# Patient Record
Sex: Male | Born: 1945 | Race: White | Hispanic: No | Smoking: Former smoker
Health system: Southern US, Community
[De-identification: ages and names within clinical notes are randomized; demographics above are authoritative.]

## PROBLEM LIST (undated history)

## (undated) DIAGNOSIS — E785 Hyperlipidemia, unspecified: Secondary | ICD-10-CM

## (undated) DIAGNOSIS — J189 Pneumonia, unspecified organism: Secondary | ICD-10-CM

## (undated) DIAGNOSIS — K559 Vascular disorder of intestine, unspecified: Secondary | ICD-10-CM

## (undated) DIAGNOSIS — I491 Atrial premature depolarization: Secondary | ICD-10-CM

## (undated) DIAGNOSIS — K279 Peptic ulcer, site unspecified, unspecified as acute or chronic, without hemorrhage or perforation: Secondary | ICD-10-CM

## (undated) DIAGNOSIS — J45909 Unspecified asthma, uncomplicated: Secondary | ICD-10-CM

## (undated) DIAGNOSIS — M318 Other specified necrotizing vasculopathies: Secondary | ICD-10-CM

## (undated) DIAGNOSIS — I7782 Antineutrophilic cytoplasmic antibody (ANCA) vasculitis: Secondary | ICD-10-CM

## (undated) DIAGNOSIS — Z9289 Personal history of other medical treatment: Secondary | ICD-10-CM

## (undated) DIAGNOSIS — K219 Gastro-esophageal reflux disease without esophagitis: Secondary | ICD-10-CM

## (undated) DIAGNOSIS — I493 Ventricular premature depolarization: Secondary | ICD-10-CM

## (undated) DIAGNOSIS — E119 Type 2 diabetes mellitus without complications: Secondary | ICD-10-CM

## (undated) DIAGNOSIS — F419 Anxiety disorder, unspecified: Secondary | ICD-10-CM

## (undated) DIAGNOSIS — I712 Thoracic aortic aneurysm, without rupture, unspecified: Secondary | ICD-10-CM

## (undated) DIAGNOSIS — M1990 Unspecified osteoarthritis, unspecified site: Secondary | ICD-10-CM

## (undated) DIAGNOSIS — I2510 Atherosclerotic heart disease of native coronary artery without angina pectoris: Secondary | ICD-10-CM

## (undated) DIAGNOSIS — I6529 Occlusion and stenosis of unspecified carotid artery: Secondary | ICD-10-CM

## (undated) DIAGNOSIS — Z87442 Personal history of urinary calculi: Secondary | ICD-10-CM

## (undated) DIAGNOSIS — I10 Essential (primary) hypertension: Secondary | ICD-10-CM

## (undated) DIAGNOSIS — G8929 Other chronic pain: Secondary | ICD-10-CM

## (undated) DIAGNOSIS — I2119 ST elevation (STEMI) myocardial infarction involving other coronary artery of inferior wall: Secondary | ICD-10-CM

## (undated) DIAGNOSIS — M549 Dorsalgia, unspecified: Secondary | ICD-10-CM

## (undated) HISTORY — DX: Personal history of other medical treatment: Z92.89

## (undated) HISTORY — DX: Atherosclerotic heart disease of native coronary artery without angina pectoris: I25.10

## (undated) HISTORY — DX: Antineutrophilic cytoplasmic antibody (ANCA) vasculitis: I77.82

## (undated) HISTORY — DX: ST elevation (STEMI) myocardial infarction involving other coronary artery of inferior wall: I21.19

## (undated) HISTORY — DX: Other specified necrotizing vasculopathies: M31.8

## (undated) HISTORY — PX: HERNIA REPAIR: SHX51

## (undated) HISTORY — DX: Atrial premature depolarization: I49.1

## (undated) HISTORY — DX: Peptic ulcer, site unspecified, unspecified as acute or chronic, without hemorrhage or perforation: K27.9

## (undated) HISTORY — DX: Vascular disorder of intestine, unspecified: K55.9

## (undated) HISTORY — DX: Essential (primary) hypertension: I10

## (undated) HISTORY — DX: Thoracic aortic aneurysm, without rupture: I71.2

## (undated) HISTORY — DX: Hyperlipidemia, unspecified: E78.5

## (undated) HISTORY — DX: Unspecified asthma, uncomplicated: J45.909

## (undated) HISTORY — DX: Occlusion and stenosis of unspecified carotid artery: I65.29

## (undated) HISTORY — DX: Ventricular premature depolarization: I49.3

## (undated) HISTORY — PX: TONSILLECTOMY: SUR1361

## (undated) HISTORY — DX: Type 2 diabetes mellitus without complications: E11.9

## (undated) HISTORY — DX: Gastro-esophageal reflux disease without esophagitis: K21.9

## (undated) HISTORY — PX: OTHER SURGICAL HISTORY: SHX169

## (undated) HISTORY — PX: EYE SURGERY: SHX253

## (undated) HISTORY — PX: CARDIAC CATHETERIZATION: SHX172

## (undated) HISTORY — DX: Thoracic aortic aneurysm, without rupture, unspecified: I71.20

---

## 1972-02-26 DIAGNOSIS — K279 Peptic ulcer, site unspecified, unspecified as acute or chronic, without hemorrhage or perforation: Secondary | ICD-10-CM

## 1972-02-26 HISTORY — DX: Peptic ulcer, site unspecified, unspecified as acute or chronic, without hemorrhage or perforation: K27.9

## 1990-02-25 HISTORY — PX: CHEST TUBE INSERTION: SHX231

## 1993-02-25 HISTORY — PX: OTHER SURGICAL HISTORY: SHX169

## 1999-01-01 ENCOUNTER — Ambulatory Visit (HOSPITAL_BASED_OUTPATIENT_CLINIC_OR_DEPARTMENT_OTHER): Admission: RE | Admit: 1999-01-01 | Discharge: 1999-01-01 | Payer: Self-pay | Admitting: Otolaryngology

## 1999-01-01 ENCOUNTER — Encounter (INDEPENDENT_AMBULATORY_CARE_PROVIDER_SITE_OTHER): Payer: Self-pay | Admitting: Specialist

## 2003-05-05 ENCOUNTER — Ambulatory Visit (HOSPITAL_COMMUNITY): Admission: RE | Admit: 2003-05-05 | Discharge: 2003-05-05 | Payer: Self-pay | Admitting: *Deleted

## 2003-05-05 ENCOUNTER — Encounter (INDEPENDENT_AMBULATORY_CARE_PROVIDER_SITE_OTHER): Payer: Self-pay | Admitting: *Deleted

## 2006-03-26 ENCOUNTER — Encounter: Admission: RE | Admit: 2006-03-26 | Discharge: 2006-03-26 | Payer: Self-pay | Admitting: Ophthalmology

## 2007-02-10 ENCOUNTER — Encounter (INDEPENDENT_AMBULATORY_CARE_PROVIDER_SITE_OTHER): Payer: Self-pay | Admitting: *Deleted

## 2007-02-10 ENCOUNTER — Ambulatory Visit (HOSPITAL_COMMUNITY): Admission: RE | Admit: 2007-02-10 | Discharge: 2007-02-10 | Payer: Self-pay | Admitting: *Deleted

## 2007-04-16 ENCOUNTER — Encounter: Admission: RE | Admit: 2007-04-16 | Discharge: 2007-04-16 | Payer: Self-pay | Admitting: Internal Medicine

## 2007-12-11 ENCOUNTER — Ambulatory Visit (HOSPITAL_COMMUNITY): Admission: RE | Admit: 2007-12-11 | Discharge: 2007-12-11 | Payer: Self-pay | Admitting: Internal Medicine

## 2007-12-16 ENCOUNTER — Encounter: Admission: RE | Admit: 2007-12-16 | Discharge: 2007-12-16 | Payer: Self-pay | Admitting: *Deleted

## 2009-02-25 DIAGNOSIS — I2119 ST elevation (STEMI) myocardial infarction involving other coronary artery of inferior wall: Secondary | ICD-10-CM

## 2009-02-25 HISTORY — DX: ST elevation (STEMI) myocardial infarction involving other coronary artery of inferior wall: I21.19

## 2009-07-19 ENCOUNTER — Encounter: Admission: RE | Admit: 2009-07-19 | Discharge: 2009-07-19 | Payer: Self-pay | Admitting: Internal Medicine

## 2009-12-26 HISTORY — PX: CORONARY ARTERY BYPASS GRAFT: SHX141

## 2010-01-01 ENCOUNTER — Inpatient Hospital Stay (HOSPITAL_COMMUNITY): Admission: AD | Admit: 2010-01-01 | Discharge: 2010-01-07 | Payer: Self-pay | Admitting: Cardiovascular Disease

## 2010-01-12 ENCOUNTER — Ambulatory Visit: Payer: Self-pay | Admitting: Surgery

## 2010-01-12 ENCOUNTER — Telehealth: Payer: Self-pay | Admitting: Cardiovascular Disease

## 2010-01-13 ENCOUNTER — Emergency Department (HOSPITAL_COMMUNITY): Admission: EM | Admit: 2010-01-13 | Discharge: 2010-01-13 | Payer: Self-pay | Admitting: Emergency Medicine

## 2010-01-15 ENCOUNTER — Telehealth: Payer: Self-pay | Admitting: Cardiovascular Disease

## 2010-01-15 DIAGNOSIS — I10 Essential (primary) hypertension: Secondary | ICD-10-CM

## 2010-01-15 DIAGNOSIS — I712 Thoracic aortic aneurysm, without rupture, unspecified: Secondary | ICD-10-CM | POA: Insufficient documentation

## 2010-01-15 DIAGNOSIS — M109 Gout, unspecified: Secondary | ICD-10-CM

## 2010-01-15 DIAGNOSIS — E785 Hyperlipidemia, unspecified: Secondary | ICD-10-CM

## 2010-01-16 ENCOUNTER — Ambulatory Visit: Payer: Self-pay | Admitting: Cardiology

## 2010-01-16 ENCOUNTER — Emergency Department (HOSPITAL_COMMUNITY): Admission: EM | Admit: 2010-01-16 | Discharge: 2010-01-16 | Payer: Self-pay | Admitting: Emergency Medicine

## 2010-01-16 ENCOUNTER — Encounter: Payer: Self-pay | Admitting: Cardiovascular Disease

## 2010-01-16 ENCOUNTER — Telehealth: Payer: Self-pay | Admitting: Cardiovascular Disease

## 2010-01-19 ENCOUNTER — Telehealth: Payer: Self-pay | Admitting: Cardiovascular Disease

## 2010-01-22 ENCOUNTER — Encounter: Payer: Self-pay | Admitting: Cardiovascular Disease

## 2010-01-22 ENCOUNTER — Ambulatory Visit: Payer: Self-pay | Admitting: Cardiovascular Disease

## 2010-01-30 ENCOUNTER — Encounter: Payer: Self-pay | Admitting: Cardiovascular Disease

## 2010-01-30 ENCOUNTER — Ambulatory Visit: Payer: Self-pay | Admitting: Surgery

## 2010-01-30 ENCOUNTER — Encounter: Admission: RE | Admit: 2010-01-30 | Discharge: 2010-01-30 | Payer: Self-pay | Admitting: Surgery

## 2010-02-05 ENCOUNTER — Ambulatory Visit: Payer: Self-pay

## 2010-02-05 ENCOUNTER — Ambulatory Visit (HOSPITAL_COMMUNITY)
Admission: RE | Admit: 2010-02-05 | Discharge: 2010-02-05 | Payer: Self-pay | Source: Home / Self Care | Attending: Cardiovascular Disease | Admitting: Cardiovascular Disease

## 2010-02-05 ENCOUNTER — Encounter: Payer: Self-pay | Admitting: Cardiovascular Disease

## 2010-02-05 ENCOUNTER — Encounter (INDEPENDENT_AMBULATORY_CARE_PROVIDER_SITE_OTHER): Payer: Self-pay | Admitting: *Deleted

## 2010-02-08 ENCOUNTER — Encounter (HOSPITAL_COMMUNITY)
Admission: RE | Admit: 2010-02-08 | Discharge: 2010-03-27 | Payer: Self-pay | Source: Home / Self Care | Attending: Cardiovascular Disease | Admitting: Cardiovascular Disease

## 2010-02-12 ENCOUNTER — Encounter: Payer: Self-pay | Admitting: Cardiovascular Disease

## 2010-03-06 ENCOUNTER — Telehealth: Payer: Self-pay | Admitting: Cardiovascular Disease

## 2010-03-27 NOTE — Progress Notes (Signed)
Summary: question re med  Phone Note Call from Patient   Caller: Patient [phone] Reason for Call: Talk to Nurse Summary of Call: pt has question re med Initial call taken by: Glynda Jaeger,  January 12, 2010 2:45 PM  Follow-up for Phone Call        Pt. needed a refill on his Lasix & Potassium. He is only taking them for five days per discharge instructions. Whitney Maeola Sarah RN  January 12, 2010 4:04 PM  Follow-up by: Whitney Maeola Sarah RN,  January 12, 2010 4:04 PM    New/Updated Medications: POTASSIUM CHLORIDE CRYS CR 20 MEQ CR-TABS (POTASSIUM CHLORIDE CRYS CR) Take one tablet by mouth daily FUROSEMIDE 40 MG TABS (FUROSEMIDE) Take one tablet by mouth daily. Prescriptions: FUROSEMIDE 40 MG TABS (FUROSEMIDE) Take one tablet by mouth daily.  #30 x 0   Entered by:   Whitney Maeola Sarah RN   Authorized by:   Verne Carrow, MD   Signed by:   Ellender Hose RN on 01/12/2010   Method used:   Electron[facility] to        [facility]  [address] [phone]* (retail)       [address]       [place], [place]  [address]       Ph: [phone] or [phone]       Fax: [phone]   RxID:   [id number] POTASSIUM CHLORIDE CRYS CR 20 MEQ CR-TABS (POTASSIUM CHLORIDE CRYS CR) Take one tablet by mouth daily  #30 x 0   Entered by:   Whitney Maeola Sarah RN   Authorized by:   Verne Carrow, MD   Signed by:   Ellender Hose RN on 01/12/2010   Method used:   Electron[facility] to        [facility]  [address] [phone]* (retail)       [address]       [place], [place]  [address]       Ph: [phone] or [phone]       Fax: [phone]   RxID:   [id number]

## 2010-03-27 NOTE — Progress Notes (Signed)
Summary: question on meds  Phone Note Call from Patient Call back at [phone]   Caller: Patient Reason for Call: Talk to Nurse Summary of Call: question on meds Initial call taken by: Roe Coombs,  January 15, 2010 12:16 PM  Follow-up for Phone Call        Pt. concerned b/c recently at the hospital he had a CT Scan that showed a small amount of fluid around his heart. He was discharged on Lasix x 5 days and is wondering if he should still be on Lasix. He spoke with Dr. Sharee Pimple RN this morning & he told the pt. that it was normal to have a small amount of pericardial fluid post op and not to worry. He is not experiencing any SOB/edema/ weight gain. He will f/u with Dr. Clifton James 11/28 & will call us back if he has any further problems. Whitney Maeola Sarah RN  January 15, 2010 12:55 PM  Follow-up by: Whitney Maeola Sarah RN,  January 15, 2010 12:51 PM

## 2010-03-27 NOTE — Assessment & Plan Note (Signed)
Summary: [facility]   Visit Type:  EPH  CC:  edema/ankles .......  History of Present Illness: [name] admitted to [facility] 01/01/10 with an acute inferior STEMI. Emergent cardiac cath after code STEMI called. He was found to have severe three vessel CAD. He underwent 3V CABG later that day with Dr. Laneta Simmers performing. (LIMA to LAD, SVG to OM, SVG to RCA). He did well in the immediate post-operative period. He was seen in the ED on 01/13/10 after he coughed and felt chest pain. CT scan did not show sternal disunion. Dr. Edwyna Shell was called and felt he was ok to go home. EKG was unchanged. He called our office on 01/16/10 with c/o vague pain over left chest wall. He was seen in the ED by Dr. Daleen Squibb and his pain was felt to be musculskeletal or related to the sternal wound as a part of normal healing. He was given pain meds, xanax and reassured. The pain was not similar to the pain he had before his bypass.   He has been feeling well over the last week. No chest pain or SOB. No dizziness or palpitations. He has been walking 10 minutes at a time and doing ok. His main complaint is gout in his left big toe.   Current Medications (verified): 1)  Aspirin Ec 325 Mg Tbec (Aspirin) .... Take One Tablet By Mouth Daily 2)  Lisinopril 5 Mg Tabs (Lisinopril) .Marland Kitchen.. 1 Tab Once Daily 3)  Metoprolol Tartrate 50 Mg Tabs (Metoprolol Tartrate) .Marland Kitchen.. 1 Tab Two Times A Day 4)  Oxycodone Hcl 5 Mg Tabs (Oxycodone Hcl) .Marland Kitchen.. 1-2 Tabs Q4-6 Hour As Needed 5)  Simvastatin 20 Mg Tabs (Simvastatin) .Marland Kitchen.. 1 Tab At Bedtime 6)  Allopurinol 100 Mg Tabs (Allopurinol) .Marland Kitchen.. 1 1/2 Tab Once Daily 7)  Dexilant 60 Mg Cpdr (Dexlansoprazole) .Marland Kitchen.. 1 Cap Qam 8)  Multivitamins   Tabs (Multiple Vitamin) .Marland Kitchen.. 1 Tab  Once Daily 9)  Vitamin C 500 Mg Tabs (Ascorbic Acid) .Marland Kitchen.. 1 Tab Once Daily 10)  Vitamin E 400 Unit Caps (Vitamin E) .Marland Kitchen.. 1 Cap Once Daily 11)  Alprazolam 0.25 Mg Tabs (Alprazolam) .... 1/2 Tab As Needed 12)  Tylenol  Extra Strength 500 Mg Tabs (Acetaminophen) .... As Needed 13)  Advil 200 Mg Tabs (Ibuprofen) .... 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 SURGEON:  Kristine Garbe. Ezzard Standing, M.D. Buttocks cyt removal  Family History: Mother died in her 45s from CHF Father died of pneumonia in his 60s  Social History: Married.  He lives with his wife.   2 chldren Owns [employer] He is a nonsmoker.  He denies alcohol abuse.  No illicit drug use     Review of Systems  The patient denies fatigue, malaise, fever, weight gain/loss, vision loss, decreased hearing, hoarseness, chest pain, palpitations, shortness of breath, prolonged cough, wheezing, sleep apnea, coughing up blood, abdominal pain, blood in stool, nausea, vomiting, diarrhea, heartburn, incontinence, blood in urine, muscle weakness, joint pain, leg swelling, rash, skin lesions, headache, fainting, dizziness, depression, anxiety, enlarged lymph nodes, easy bruising or bleeding, and environmental allergies.    Vital Signs:  Patient profile:   65 year old  male Height:      70 inches Weight:      218 pounds BMI:     31.39 Pulse rate:   68 / minute Pulse rhythm:   regular BP sitting:   122 / 82  (left arm) Cuff size:   large  Vitals Entered By: Danielle Rankin, CMA (January 22, 2010 2:07 PM)  Physical Exam  General:  General: Well developed, well nourished, NAD HEENT: OP clear, mucus membranes moist SKIN: warm, dry Neuro: No focal deficits Musculoskeletal: Muscle strength 5/5 all ext Psychiatric: Mood and affect normal Neck: No JVD, no carotid bruits, no thyromegaly, no  lymphadenopathy. Lungs:Clear bilaterally, no wheezes, rhonci, crackles CV: RRR no murmurs, gallops rubs Chest wall: well healed scar in the midline.  Abdomen: soft, NT, ND, BS present Extremities: Trace bilateral lower ext edema, pulses 2+.    Cardiac Cath  Procedure date:  01/01/2010  Findings:       1. Left main coronary artery had no obstructive disease.   2. Left anterior descending was a large vessel that coursed to the       apex and gave off 5 small-caliber diagonal branches.  The apical       LAD actually wrapped around the apex with three different branches.       The mid LAD had an ulcerated 80% lesion.  It appeared that the       proximal vessel had a long segment of 70% stenosis prior to this       ulcerated plaque.  The distal vessel was large caliber.  There       appeared to be a distal 60% stenosis.   3. The circumflex artery was 100% occluded in the midportion of the       vessel.  At the end of the case, his last ejection showed that       there was actually TIMI 3 flow into the circumflex.  This was most       likely an ulcerated lesion with thrombus allowing occasional flow.   4. The right coronary artery is a large dominant vessel with 90% mid       stenosis and a 70% stenosis in the proximal portion of the       posterolateral branch.   5. Left ventricular angiogram was performed in the RAO projection and       showed normal left ventricular systolic function with ejection       fraction of 50%.      IMPRESSION:   1. Severe triple-vessel coronary artery disease with acute       inferolateral ST-elevation myocardial infarction.   2. Overall preserved left ventricular systolic function.      Impression & Recommendations:  Problem # 1:  CAD, ARTERY BYPASS GRAFT (ICD-414.04) Stable. Continue ASA, statin, beta blocker and Ace-inhibitor. Will repeat echo. CT chest last week with small pericardial effusion which is probably to be expected post bypass.   His  updated medication list for this problem includes:    Aspirin Ec 325 Mg Tbec (Aspirin) .Marland Kitchen... Take one tablet by mouth daily    Lisinopril 5 Mg Tabs (Lisinopril) .Marland Kitchen... 1 tab once daily    Metoprolol Tartrate 50 Mg Tabs (Metoprolol tartrate) .Marland Kitchen... 1 tab two times a day  Orders: Echocardiogram (Echo)  Problem # 2:  HYPERTENSION (ICD-401.9) BP well controlled. Continue current therapy.   The following medications were removed from the medication list:    Furosemide 40 Mg Tabs (  Furosemide) .Marland Kitchen... Take one tablet by mouth daily. His updated medication list for this problem includes:    Aspirin Ec 325 Mg Tbec (Aspirin) .Marland Kitchen... Take one tablet by mouth daily    Lisinopril 5 Mg Tabs (Lisinopril) .Marland Kitchen... 1 tab once daily    Metoprolol Tartrate 50 Mg Tabs (Metoprolol tartrate) .Marland Kitchen... 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   Patient Instructions: 1)  Your physician recommends that you schedule a follow-up appointment in: 6 months. 2)  Your physician recommends that you continue on your current medications as directed. Please refer to the Current Medication list given to you today. 3)  Your physician has requested that you have an echocardiogram.  Echocardiography is a painless test that uses sound waves to create images of your heart. It provides your doctor with information about the size and shape of your heart and how well your heart's chambers and valves are working.  This procedure takes approximately one hour. There are no restrictions for this procedure.

## 2010-03-27 NOTE — Progress Notes (Signed)
Summary: Questions about medications  Phone Note Call from Patient Call back at [phone]   Caller: Patient Summary of Call: Pt request call have questions about medications Initial call taken by: Judie Grieve,  January 19, 2010 10:58 AM  Follow-up for Phone Call        01/19/10--1115am--pt calling, stating is having gout flare-up--would like to know if he can take ibuprophen along with allopurinal for flare up--advised may take tylenol, but prefer he not take any ibuprophen at this time--pt agrees--nt Follow-up by: Ledon Snare, RN,  January 19, 2010 11:18 AM

## 2010-03-27 NOTE — Assessment & Plan Note (Signed)
Summary: eph/triple bypass/mt

## 2010-03-27 NOTE — Progress Notes (Signed)
Summary: c/o chestpain  Phone Note Call from Patient Call back at Home Phone [phone]   Caller: Patient [phone] Reason for Call: Talk to Nurse Complaint: Chest Pain Details of Complaint: Pt had triple bypass on 11/ 7. Initial call taken by: Lorne Skeens,  January 16, 2010 12:43 PM  Follow-up for Phone Call        SOB with rest & activity, chest pressure radiating to left arm, jaw, back. He is no longer having jaw & arm pain but is still having chest pressure. 3/10.  Spoke with Dr. Clifton James. We will send pt. to the ER to be evaluated since he just had bypass surgery. I will notify the ER & Trish. Whitney Maeola Sarah RN  January 16, 2010 12:58 PM  Follow-up by: Whitney Maeola Sarah RN,  January 16, 2010 12:48 PM

## 2010-03-28 ENCOUNTER — Encounter (HOSPITAL_COMMUNITY): Payer: MEDICARE | Attending: Cardiovascular Disease

## 2010-03-28 DIAGNOSIS — Z5189 Encounter for other specified aftercare: Secondary | ICD-10-CM | POA: Insufficient documentation

## 2010-03-28 DIAGNOSIS — K219 Gastro-esophageal reflux disease without esophagitis: Secondary | ICD-10-CM | POA: Insufficient documentation

## 2010-03-28 DIAGNOSIS — I10 Essential (primary) hypertension: Secondary | ICD-10-CM | POA: Insufficient documentation

## 2010-03-28 DIAGNOSIS — I2510 Atherosclerotic heart disease of native coronary artery without angina pectoris: Secondary | ICD-10-CM | POA: Insufficient documentation

## 2010-03-28 DIAGNOSIS — I214 Non-ST elevation (NSTEMI) myocardial infarction: Secondary | ICD-10-CM | POA: Insufficient documentation

## 2010-03-28 DIAGNOSIS — E785 Hyperlipidemia, unspecified: Secondary | ICD-10-CM | POA: Insufficient documentation

## 2010-03-28 DIAGNOSIS — Z951 Presence of aortocoronary bypass graft: Secondary | ICD-10-CM | POA: Insufficient documentation

## 2010-03-29 NOTE — Letter (Signed)
Summary: [facility] Office Visit Note   [facility] Office Visit Note   Imported By: Roderic Ovens 02/23/2010 11:58:45  _____________________________________________________________________  External Attachment:    Type:   Image     Comment:   External Document

## 2010-03-29 NOTE — Letter (Signed)
Summary: Outpatient Coinsurance Notice  Outpatient Coinsurance Notice   Imported By: Marylou Mccoy 02/12/2010 18:54:24  _____________________________________________________________________  External Attachment:    Type:   Image     Comment:   External Document

## 2010-03-29 NOTE — Miscellaneous (Signed)
Summary: [facility]   [facility] Progress Report   Imported By: Roderic Ovens 03/08/2010 16:45:48  _____________________________________________________________________  External Attachment:    Type:   Image     Comment:   External Document

## 2010-03-29 NOTE — Progress Notes (Signed)
Summary: returning your call  Phone Note Call from Patient Call back at Home Phone [phone]   Caller: Patient Reason for Call: Talk to Nurse Summary of Call: pt states he returning your call re rx Initial call taken by: Roe Coombs,  March 06, 2010 9:27 AM  Follow-up for Phone Call        Patient discharged from the hospital on 10mg  Lisinopril and has been taking 10mg  since then. At his OV with [facility] post hospital, we have that he was taking 5 mg Lisinopril. Patient is feeling good. I will forward to Dr. Clifton James to confirm that he would like him to stay on 10mg  or decrease to 5mg .  Whitney Maeola Sarah RN  March 06, 2010 9:56 AM  Follow-up by: Whitney Maeola Sarah RN,  March 06, 2010 9:56 AM  Additional Follow-up for Phone Call Additional follow up Details #1::        Because the pt has been taking Lisinopril 10mg  since discharge from the hospital and his medication list is incorrect, we will send in Rx for 10mg  tablet.  Julieta Gutting, RN, BSN  March 07, 2010 2:58 PM    Additional Follow-up for Phone Call Additional follow up Details #2::    thanks Follow-up by: Verne Carrow, MD,  March 08, 2010 6:20 PM  New/Updated Medications: LISINOPRIL 10 MG TABS (LISINOPRIL) Take one tablet by mouth daily Prescriptions: LISINOPRIL 10 MG TABS (LISINOPRIL) Take one tablet by mouth daily  #30 x 6   Entered by:   Julieta Gutting, RN, BSN   Authorized by:   Verne Carrow, MD   Signed by:   Julieta Gutting, RN, BSN on 03/07/2010   Method used:   [facility]        [facility]  [address] [phone]* (retail)       [address]       [place], [place]  [address]       Ph: [phone] or [phone]       Fax: [phone]   RxID:   [id number]

## 2010-03-30 ENCOUNTER — Encounter (HOSPITAL_COMMUNITY): Payer: MEDICARE

## 2010-04-02 ENCOUNTER — Encounter (HOSPITAL_COMMUNITY): Payer: MEDICARE

## 2010-04-04 ENCOUNTER — Encounter (HOSPITAL_COMMUNITY): Payer: MEDICARE

## 2010-04-06 ENCOUNTER — Encounter (HOSPITAL_COMMUNITY): Payer: MEDICARE

## 2010-04-09 ENCOUNTER — Encounter (HOSPITAL_COMMUNITY): Payer: MEDICARE

## 2010-04-11 ENCOUNTER — Encounter (HOSPITAL_COMMUNITY): Payer: MEDICARE

## 2010-04-13 ENCOUNTER — Encounter (HOSPITAL_COMMUNITY): Payer: MEDICARE

## 2010-04-16 ENCOUNTER — Encounter (HOSPITAL_COMMUNITY): Payer: MEDICARE

## 2010-04-18 ENCOUNTER — Encounter (HOSPITAL_COMMUNITY): Payer: MEDICARE

## 2010-04-20 ENCOUNTER — Encounter (HOSPITAL_COMMUNITY): Payer: MEDICARE

## 2010-04-23 ENCOUNTER — Encounter (HOSPITAL_COMMUNITY): Payer: MEDICARE

## 2010-04-25 ENCOUNTER — Encounter (HOSPITAL_COMMUNITY): Payer: MEDICARE

## 2010-04-27 ENCOUNTER — Encounter (HOSPITAL_COMMUNITY): Payer: MEDICARE | Attending: Cardiovascular Disease

## 2010-04-27 DIAGNOSIS — I2510 Atherosclerotic heart disease of native coronary artery without angina pectoris: Secondary | ICD-10-CM | POA: Insufficient documentation

## 2010-04-27 DIAGNOSIS — Z5189 Encounter for other specified aftercare: Secondary | ICD-10-CM | POA: Insufficient documentation

## 2010-04-27 DIAGNOSIS — K219 Gastro-esophageal reflux disease without esophagitis: Secondary | ICD-10-CM | POA: Insufficient documentation

## 2010-04-27 DIAGNOSIS — I10 Essential (primary) hypertension: Secondary | ICD-10-CM | POA: Insufficient documentation

## 2010-04-27 DIAGNOSIS — Z951 Presence of aortocoronary bypass graft: Secondary | ICD-10-CM | POA: Insufficient documentation

## 2010-04-27 DIAGNOSIS — E785 Hyperlipidemia, unspecified: Secondary | ICD-10-CM | POA: Insufficient documentation

## 2010-04-27 DIAGNOSIS — I214 Non-ST elevation (NSTEMI) myocardial infarction: Secondary | ICD-10-CM | POA: Insufficient documentation

## 2010-04-30 ENCOUNTER — Encounter (HOSPITAL_COMMUNITY): Payer: MEDICARE

## 2010-05-02 ENCOUNTER — Encounter (HOSPITAL_COMMUNITY): Payer: MEDICARE

## 2010-05-04 ENCOUNTER — Encounter (HOSPITAL_COMMUNITY): Payer: MEDICARE

## 2010-05-07 ENCOUNTER — Encounter (HOSPITAL_COMMUNITY): Payer: MEDICARE

## 2010-05-08 LAB — CBC
HCT: 32.9 % — ABNORMAL LOW (ref 39.0–52.0)
HCT: 33.4 % — ABNORMAL LOW (ref 39.0–52.0)
HCT: 36.3 % — ABNORMAL LOW (ref 39.0–52.0)
HCT: 37.5 % — ABNORMAL LOW (ref 39.0–52.0)
Hemoglobin: 11.3 g/dL — ABNORMAL LOW (ref 13.0–17.0)
Hemoglobin: 11.3 g/dL — ABNORMAL LOW (ref 13.0–17.0)
Hemoglobin: 12.1 g/dL — ABNORMAL LOW (ref 13.0–17.0)
Hemoglobin: 14.4 g/dL (ref 13.0–17.0)
Hemoglobin: 15.9 g/dL (ref 13.0–17.0)
MCH: 31.9 pg (ref 26.0–34.0)
MCH: 31.9 pg (ref 26.0–34.0)
MCH: 32.1 pg (ref 26.0–34.0)
MCH: 32.2 pg (ref 26.0–34.0)
MCH: 32.4 pg (ref 26.0–34.0)
MCH: 32.7 pg (ref 26.0–34.0)
MCH: 32.9 pg (ref 26.0–34.0)
MCHC: 33.8 g/dL (ref 30.0–36.0)
MCHC: 33.9 g/dL (ref 30.0–36.0)
MCHC: 34.3 g/dL (ref 30.0–36.0)
MCHC: 34.4 g/dL (ref 30.0–36.0)
MCHC: 34.8 g/dL (ref 30.0–36.0)
MCHC: 35.3 g/dL (ref 30.0–36.0)
MCV: 93.4 fL (ref 78.0–100.0)
MCV: 94 fL (ref 78.0–100.0)
MCV: 94.8 fL (ref 78.0–100.0)
MCV: 95.5 fL (ref 78.0–100.0)
Platelets: 110 10*3/uL — ABNORMAL LOW (ref 150–400)
Platelets: 121 10*3/uL — ABNORMAL LOW (ref 150–400)
Platelets: 411 10*3/uL — ABNORMAL HIGH (ref 150–400)
RBC: 3.79 MIL/uL — ABNORMAL LOW (ref 4.22–5.81)
RBC: 4.04 MIL/uL — ABNORMAL LOW (ref 4.22–5.81)
RBC: 4.83 MIL/uL (ref 4.22–5.81)
RDW: 12.9 % (ref 11.5–15.5)
RDW: 13 % (ref 11.5–15.5)
RDW: 13.2 % (ref 11.5–15.5)
RDW: 13.3 % (ref 11.5–15.5)
RDW: 13.4 % (ref 11.5–15.5)
RDW: 13.5 % (ref 11.5–15.5)
WBC: 11.6 10*3/uL — ABNORMAL HIGH (ref 4.0–10.5)
WBC: 17.2 10*3/uL — ABNORMAL HIGH (ref 4.0–10.5)
WBC: 17.8 10*3/uL — ABNORMAL HIGH (ref 4.0–10.5)

## 2010-05-08 LAB — POCT I-STAT 3, ART BLOOD GAS (G3+)
Acid-Base Excess: 1 mmol/L (ref 0.0–2.0)
Acid-Base Excess: 3 mmol/L — ABNORMAL HIGH (ref 0.0–2.0)
Bicarbonate: 24.8 mEq/L — ABNORMAL HIGH (ref 20.0–24.0)
Bicarbonate: 25 mEq/L — ABNORMAL HIGH (ref 20.0–24.0)
Bicarbonate: 26.3 mEq/L — ABNORMAL HIGH (ref 20.0–24.0)
Bicarbonate: 29.2 mEq/L — ABNORMAL HIGH (ref 20.0–24.0)
O2 Saturation: 100 %
O2 Saturation: 98 %
O2 Saturation: 99 %
Patient temperature: 35.8
Patient temperature: 36.7
TCO2: 26 mmol/L (ref 0–100)
TCO2: 27 mmol/L (ref 0–100)
TCO2: 28 mmol/L (ref 0–100)
pCO2 arterial: 37.9 mmHg (ref 35.0–45.0)
pH, Arterial: 7.352 (ref 7.350–7.450)
pH, Arterial: 7.418 (ref 7.350–7.450)
pO2, Arterial: 105 mmHg — ABNORMAL HIGH (ref 80.0–100.0)
pO2, Arterial: 128 mmHg — ABNORMAL HIGH (ref 80.0–100.0)
pO2, Arterial: 357 mmHg — ABNORMAL HIGH (ref 80.0–100.0)
pO2, Arterial: 385 mmHg — ABNORMAL HIGH (ref 80.0–100.0)

## 2010-05-08 LAB — POCT I-STAT, CHEM 8
Calcium, Ion: 1.13 mmol/L (ref 1.12–1.32)
Calcium, Ion: 1.28 mmol/L (ref 1.12–1.32)
Creatinine, Ser: 0.9 mg/dL (ref 0.4–1.5)
Creatinine, Ser: 1.1 mg/dL (ref 0.4–1.5)
Glucose, Bld: 121 mg/dL — ABNORMAL HIGH (ref 70–99)
Glucose, Bld: 165 mg/dL — ABNORMAL HIGH (ref 70–99)
HCT: 38 % — ABNORMAL LOW (ref 39.0–52.0)
HCT: 39 % (ref 39.0–52.0)
Hemoglobin: 12.9 g/dL — ABNORMAL LOW (ref 13.0–17.0)
Hemoglobin: 13.3 g/dL (ref 13.0–17.0)
Hemoglobin: 16.7 g/dL (ref 13.0–17.0)
Potassium: 3.9 mEq/L (ref 3.5–5.1)
Sodium: 134 mEq/L — ABNORMAL LOW (ref 135–145)
TCO2: 26 mmol/L (ref 0–100)
TCO2: 27 mmol/L (ref 0–100)
TCO2: 28 mmol/L (ref 0–100)

## 2010-05-08 LAB — POCT CARDIAC MARKERS
CKMB, poc: 1.1 ng/mL (ref 1.0–8.0)
Myoglobin, poc: 11.9 ng/mL — ABNORMAL LOW (ref 12–200)
Myoglobin, poc: 14.4 ng/mL (ref 12–200)
Myoglobin, poc: 17.9 ng/mL (ref 12–200)
Troponin i, poc: <0.05 ng/mL (ref 0.00–0.09)

## 2010-05-08 LAB — MRSA CULTURE

## 2010-05-08 LAB — POCT I-STAT 4, (NA,K, GLUC, HGB,HCT)
Glucose, Bld: 133 mg/dL — ABNORMAL HIGH (ref 70–99)
Glucose, Bld: 134 mg/dL — ABNORMAL HIGH (ref 70–99)
HCT: 33 % — ABNORMAL LOW (ref 39.0–52.0)
HCT: 47 % (ref 39.0–52.0)
Hemoglobin: 11.6 g/dL — ABNORMAL LOW (ref 13.0–17.0)
Hemoglobin: 12.6 g/dL — ABNORMAL LOW (ref 13.0–17.0)
Hemoglobin: 16 g/dL (ref 13.0–17.0)
Hemoglobin: 16.7 g/dL (ref 13.0–17.0)
Potassium: 3.7 mEq/L (ref 3.5–5.1)
Potassium: 4.3 mEq/L (ref 3.5–5.1)
Sodium: 134 mEq/L — ABNORMAL LOW (ref 135–145)
Sodium: 135 mEq/L (ref 135–145)
Sodium: 138 mEq/L (ref 135–145)
Sodium: 139 mEq/L (ref 135–145)

## 2010-05-08 LAB — DIFFERENTIAL
Basophils Absolute: 0 10*3/uL (ref 0.0–0.1)
Basophils Relative: 0 % (ref 0–1)
Eosinophils Absolute: 0.3 10*3/uL (ref 0.0–0.7)
Eosinophils Absolute: 0.3 10*3/uL (ref 0.0–0.7)
Eosinophils Relative: 2 % (ref 0–5)
Lymphs Abs: 1.9 10*3/uL (ref 0.7–4.0)
Monocytes Relative: 11 % (ref 3–12)
Neutrophils Relative %: 71 % (ref 43–77)

## 2010-05-08 LAB — CREATININE, SERUM
Creatinine, Ser: 0.95 mg/dL (ref 0.4–1.5)
GFR calc Af Amer: >60 mL/min (ref >60)
GFR calc non Af Amer: >60 mL/min (ref >60)

## 2010-05-08 LAB — GLUCOSE, CAPILLARY
Glucose-Capillary: 110 mg/dL — ABNORMAL HIGH (ref 70–99)
Glucose-Capillary: 153 mg/dL — ABNORMAL HIGH (ref 70–99)
Glucose-Capillary: 161 mg/dL — ABNORMAL HIGH (ref 70–99)
Glucose-Capillary: 164 mg/dL — ABNORMAL HIGH (ref 70–99)
Glucose-Capillary: 97 mg/dL (ref 70–99)

## 2010-05-08 LAB — TYPE AND SCREEN: Antibody Screen: NEGATIVE

## 2010-05-08 LAB — BASIC METABOLIC PANEL
BUN: 11 mg/dL (ref 6–23)
BUN: 12 mg/dL (ref 6–23)
BUN: 16 mg/dL (ref 6–23)
CO2: 26 mEq/L (ref 19–32)
CO2: 27 mEq/L (ref 19–32)
CO2: 31 mEq/L (ref 19–32)
CO2: 31 mEq/L (ref 19–32)
Calcium: 7.6 mg/dL — ABNORMAL LOW (ref 8.4–10.5)
Calcium: 8.7 mg/dL (ref 8.4–10.5)
Calcium: 9.7 mg/dL (ref 8.4–10.5)
Chloride: 101 mEq/L (ref 96–112)
Chloride: 102 mEq/L (ref 96–112)
Creatinine, Ser: 0.92 mg/dL (ref 0.4–1.5)
GFR calc Af Amer: >60 mL/min (ref >60)
GFR calc non Af Amer: >60 mL/min (ref >60)
Glucose, Bld: 126 mg/dL — ABNORMAL HIGH (ref 70–99)
Glucose, Bld: 135 mg/dL — ABNORMAL HIGH (ref 70–99)
Glucose, Bld: 166 mg/dL — ABNORMAL HIGH (ref 70–99)
Potassium: 4.7 mEq/L (ref 3.5–5.1)
Sodium: 135 mEq/L (ref 135–145)
Sodium: 136 mEq/L (ref 135–145)

## 2010-05-08 LAB — COMPREHENSIVE METABOLIC PANEL
BUN: 16 mg/dL (ref 6–23)
CO2: 26 mEq/L (ref 19–32)
Calcium: 9.2 mg/dL (ref 8.4–10.5)
Chloride: 101 mEq/L (ref 96–112)
Creatinine, Ser: 1 mg/dL (ref 0.4–1.5)
GFR calc non Af Amer: >60 mL/min (ref >60)
Total Bilirubin: 0.9 mg/dL (ref 0.3–1.2)

## 2010-05-08 LAB — CARDIAC PANEL(CRET KIN+CKTOT+MB+TROPI)
CK, MB: 13.1 ng/mL (ref 0.3–4.0)
Total CK: 233 U/L — ABNORMAL HIGH (ref 7–232)

## 2010-05-08 LAB — HEMOGLOBIN A1C
Hgb A1c MFr Bld: 5.7 % — ABNORMAL HIGH (ref <5.7)
Mean Plasma Glucose: 117 mg/dL — ABNORMAL HIGH (ref <117)

## 2010-05-08 LAB — PROTIME-INR
INR: 1.19 (ref 0.00–1.49)
Prothrombin Time: 15.3 seconds — ABNORMAL HIGH (ref 11.6–15.2)
Prothrombin Time: 17.7 seconds — ABNORMAL HIGH (ref 11.6–15.2)

## 2010-05-08 LAB — ABO/RH: ABO/RH(D): O POS

## 2010-05-08 LAB — HEMOGLOBIN AND HEMATOCRIT, BLOOD: Hemoglobin: 12.4 g/dL — ABNORMAL LOW (ref 13.0–17.0)

## 2010-05-08 LAB — MAGNESIUM: Magnesium: 2.2 mg/dL (ref 1.5–2.5)

## 2010-05-09 ENCOUNTER — Encounter (HOSPITAL_COMMUNITY): Payer: MEDICARE

## 2010-05-11 ENCOUNTER — Encounter (HOSPITAL_COMMUNITY): Payer: MEDICARE

## 2010-05-14 ENCOUNTER — Encounter (HOSPITAL_COMMUNITY): Payer: MEDICARE

## 2010-05-16 ENCOUNTER — Encounter (HOSPITAL_COMMUNITY): Payer: MEDICARE

## 2010-05-18 ENCOUNTER — Encounter (HOSPITAL_COMMUNITY): Payer: MEDICARE

## 2010-05-21 ENCOUNTER — Encounter (HOSPITAL_COMMUNITY): Payer: MEDICARE

## 2010-05-21 ENCOUNTER — Encounter (HOSPITAL_COMMUNITY): Payer: Self-pay | Attending: Cardiovascular Disease

## 2010-05-21 DIAGNOSIS — K219 Gastro-esophageal reflux disease without esophagitis: Secondary | ICD-10-CM | POA: Insufficient documentation

## 2010-05-21 DIAGNOSIS — E785 Hyperlipidemia, unspecified: Secondary | ICD-10-CM | POA: Insufficient documentation

## 2010-05-21 DIAGNOSIS — I712 Thoracic aortic aneurysm, without rupture, unspecified: Secondary | ICD-10-CM | POA: Insufficient documentation

## 2010-05-21 DIAGNOSIS — Z5189 Encounter for other specified aftercare: Secondary | ICD-10-CM | POA: Insufficient documentation

## 2010-05-21 DIAGNOSIS — I10 Essential (primary) hypertension: Secondary | ICD-10-CM | POA: Insufficient documentation

## 2010-05-21 DIAGNOSIS — I214 Non-ST elevation (NSTEMI) myocardial infarction: Secondary | ICD-10-CM | POA: Insufficient documentation

## 2010-05-21 DIAGNOSIS — Z951 Presence of aortocoronary bypass graft: Secondary | ICD-10-CM | POA: Insufficient documentation

## 2010-05-21 DIAGNOSIS — I2510 Atherosclerotic heart disease of native coronary artery without angina pectoris: Secondary | ICD-10-CM | POA: Insufficient documentation

## 2010-05-23 ENCOUNTER — Encounter (HOSPITAL_COMMUNITY): Payer: MEDICARE

## 2010-05-23 ENCOUNTER — Encounter (HOSPITAL_COMMUNITY)
Admission: RE | Admit: 2010-05-23 | Discharge: 2010-05-23 | Disposition: A | Discharge status: Home / Self Care | Payer: Self-pay | Source: Ambulatory Visit | Attending: Cardiovascular Disease | Admitting: Cardiovascular Disease

## 2010-05-25 ENCOUNTER — Encounter (HOSPITAL_COMMUNITY): Payer: MEDICARE

## 2010-05-25 ENCOUNTER — Encounter (HOSPITAL_COMMUNITY): Payer: Self-pay

## 2010-05-28 ENCOUNTER — Encounter (HOSPITAL_COMMUNITY): Payer: MEDICARE

## 2010-05-28 ENCOUNTER — Encounter (HOSPITAL_COMMUNITY): Payer: Self-pay | Attending: Cardiovascular Disease

## 2010-05-28 DIAGNOSIS — I214 Non-ST elevation (NSTEMI) myocardial infarction: Secondary | ICD-10-CM | POA: Insufficient documentation

## 2010-05-28 DIAGNOSIS — E785 Hyperlipidemia, unspecified: Secondary | ICD-10-CM | POA: Insufficient documentation

## 2010-05-28 DIAGNOSIS — K219 Gastro-esophageal reflux disease without esophagitis: Secondary | ICD-10-CM | POA: Insufficient documentation

## 2010-05-28 DIAGNOSIS — Z951 Presence of aortocoronary bypass graft: Secondary | ICD-10-CM | POA: Insufficient documentation

## 2010-05-28 DIAGNOSIS — I712 Thoracic aortic aneurysm, without rupture, unspecified: Secondary | ICD-10-CM | POA: Insufficient documentation

## 2010-05-28 DIAGNOSIS — I2510 Atherosclerotic heart disease of native coronary artery without angina pectoris: Secondary | ICD-10-CM | POA: Insufficient documentation

## 2010-05-28 DIAGNOSIS — I10 Essential (primary) hypertension: Secondary | ICD-10-CM | POA: Insufficient documentation

## 2010-05-28 DIAGNOSIS — Z5189 Encounter for other specified aftercare: Secondary | ICD-10-CM | POA: Insufficient documentation

## 2010-05-30 ENCOUNTER — Encounter (HOSPITAL_COMMUNITY): Payer: MEDICARE

## 2010-05-30 ENCOUNTER — Encounter (HOSPITAL_COMMUNITY): Payer: Self-pay

## 2010-06-01 ENCOUNTER — Encounter (HOSPITAL_COMMUNITY): Payer: Self-pay

## 2010-06-01 ENCOUNTER — Encounter (HOSPITAL_COMMUNITY): Payer: MEDICARE

## 2010-06-04 ENCOUNTER — Encounter (HOSPITAL_COMMUNITY): Payer: Self-pay

## 2010-06-04 ENCOUNTER — Encounter (HOSPITAL_COMMUNITY): Payer: MEDICARE

## 2010-06-06 ENCOUNTER — Encounter (HOSPITAL_COMMUNITY): Payer: MEDICARE

## 2010-06-06 ENCOUNTER — Encounter (HOSPITAL_COMMUNITY): Payer: Self-pay

## 2010-06-08 ENCOUNTER — Encounter (HOSPITAL_COMMUNITY): Payer: MEDICARE

## 2010-06-08 ENCOUNTER — Encounter (HOSPITAL_COMMUNITY): Payer: Self-pay

## 2010-06-11 ENCOUNTER — Encounter (HOSPITAL_COMMUNITY): Payer: Self-pay

## 2010-06-11 ENCOUNTER — Encounter (HOSPITAL_COMMUNITY): Payer: MEDICARE

## 2010-06-13 ENCOUNTER — Encounter (HOSPITAL_COMMUNITY): Payer: MEDICARE

## 2010-06-13 ENCOUNTER — Encounter (HOSPITAL_COMMUNITY): Payer: Self-pay

## 2010-06-15 ENCOUNTER — Encounter (HOSPITAL_COMMUNITY): Payer: MEDICARE

## 2010-06-15 ENCOUNTER — Encounter (HOSPITAL_COMMUNITY): Payer: Self-pay

## 2010-06-18 ENCOUNTER — Encounter (HOSPITAL_COMMUNITY): Payer: Self-pay

## 2010-06-18 ENCOUNTER — Encounter (HOSPITAL_COMMUNITY): Payer: MEDICARE

## 2010-06-20 ENCOUNTER — Encounter (HOSPITAL_COMMUNITY): Payer: Self-pay

## 2010-06-22 ENCOUNTER — Encounter (HOSPITAL_COMMUNITY): Payer: Self-pay

## 2010-06-25 ENCOUNTER — Encounter (HOSPITAL_COMMUNITY): Payer: Self-pay

## 2010-06-27 ENCOUNTER — Encounter (HOSPITAL_COMMUNITY): Payer: Self-pay | Attending: Cardiovascular Disease

## 2010-06-27 DIAGNOSIS — I10 Essential (primary) hypertension: Secondary | ICD-10-CM | POA: Insufficient documentation

## 2010-06-27 DIAGNOSIS — I2510 Atherosclerotic heart disease of native coronary artery without angina pectoris: Secondary | ICD-10-CM | POA: Insufficient documentation

## 2010-06-27 DIAGNOSIS — I712 Thoracic aortic aneurysm, without rupture, unspecified: Secondary | ICD-10-CM | POA: Insufficient documentation

## 2010-06-27 DIAGNOSIS — K219 Gastro-esophageal reflux disease without esophagitis: Secondary | ICD-10-CM | POA: Insufficient documentation

## 2010-06-27 DIAGNOSIS — I214 Non-ST elevation (NSTEMI) myocardial infarction: Secondary | ICD-10-CM | POA: Insufficient documentation

## 2010-06-27 DIAGNOSIS — Z5189 Encounter for other specified aftercare: Secondary | ICD-10-CM | POA: Insufficient documentation

## 2010-06-27 DIAGNOSIS — E785 Hyperlipidemia, unspecified: Secondary | ICD-10-CM | POA: Insufficient documentation

## 2010-06-27 DIAGNOSIS — Z951 Presence of aortocoronary bypass graft: Secondary | ICD-10-CM | POA: Insufficient documentation

## 2010-06-29 ENCOUNTER — Encounter (HOSPITAL_COMMUNITY): Payer: Self-pay

## 2010-07-02 ENCOUNTER — Encounter (HOSPITAL_COMMUNITY): Payer: Self-pay

## 2010-07-04 ENCOUNTER — Encounter (HOSPITAL_COMMUNITY): Payer: Self-pay

## 2010-07-06 ENCOUNTER — Encounter (HOSPITAL_COMMUNITY): Payer: Self-pay

## 2010-07-09 ENCOUNTER — Encounter (HOSPITAL_COMMUNITY): Payer: Self-pay

## 2010-07-10 NOTE — Assessment & Plan Note (Signed)
OFFICE VISIT   CIRO, TASHIRO  DOB:  [birth year]                                        January 30, 2010  CHART #:  [id number]   HISTORY:  The patient returned to my office today for followup status  post emergent coronary artery bypass graft surgery on January 01, 2010.  Since discharge, he said he has continued to feel better.  He is walking  about 10 minutes at a time several times per day, but only inside his  house.  He said that he had gout in his left foot for awhile which  slowed him down.  He has had no significant shortness of breath or chest  discomfort.  He is starting cardiac rehab next week.   PHYSICAL EXAMINATION:  His blood pressure is 112/79, pulse 88 and  regular, and respiratory rate 16, unlabored.  Oxygen saturation on room  air is 99%.  He looks well.  Cardiac exam shows regular rate and rhythm  with normal S1 and S2.  There is no murmur, rub, or gallop.  His lungs  are clear.  The chest incision is healing well and the sternum is  stable.  His right leg vein harvest incision is healing well.  There is  mild right ankle edema.   DIAGNOSTIC TESTS:  Followup chest x-ray today shows clear lung fields  and no pleural effusions.   MEDICATIONS:  1. Lisinopril 10 mg daily.  2. Aspirin 325 mg daily.  3. Lopressor 50 mg b.i.d.  4. Zocor 20 mg daily.  5. Allopurinol 100 mg daily.  6. Dexilant 60 mg daily.  7. Multivitamin daily.  8. Vitamin C daily.  9. Vitamin E daily.  10.Colchicine p.r.n.  11.Tylenol p.r.n. for pain.   IMPRESSION:  Overall, the patient is making a good recovery following  his surgery.  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  I encouraged him to get outside  and start increasing the distance that he is walking.  He has a followup  appointment next week with Dr. Clifton James and will return  to see me if  there are any problems with his incision.   Evelene Croon, M.D.  Electronically Signed   BB/MEDQ  D:  01/30/2010  T:  01/30/2010  Job:  [id number]   cc:   Verne Carrow, MD

## 2010-07-10 NOTE — Assessment & Plan Note (Signed)
[facility]                                 ON-CALL NOTE   NAME:[name], Samuel Coleman                  MRN:          [id number]  DATE:01/13/2010                            DOB:          08-05-1945    CARDIOLOGIST:  Verne Carrow, MD   HISTORY:  [name] is a 65 year old male who was discharged from  [facility] November 12 after having a coronary bypass surgery.  This was done emergently.  He was doing well with minimal chest soreness  until this morning when he sneezed.  He developed sudden sharp chest  pain after that.  He could not detect any deformity in the sternum.  He  denied any fevers or cough.  Denies hemoptysis.  He denies any symptoms  similar to his previous angina.  He denies any nausea or diaphoresis.   PLAN:  I recommended the patient go to the emergency room.  Given the  uncertainty of his symptoms at this time, I recommended he call 911 and  be transported by EMS.   DISPOSITION:  The patient agrees with the above plan.      Tereso Newcomer, PA-C       Peter C. Eden Emms, MD, [facility]    SW/MedQ  DD: 01/13/2010  DT: 01/13/2010  Job #: [id number]   cc:   Verne Carrow, MD

## 2010-07-10 NOTE — Op Note (Signed)
NAME:  [name], [name] NO.:  [id number]   MEDICAL RECORD NO.:  [id number]          PATIENT TYPE:  AMB   LOCATION:  ENDO                         FACILITY:  [facility]   PHYSICIAN:  Georgiana Spinner, M.D.    DATE OF BIRTH:  1946-02-22   DATE OF PROCEDURE:  02/10/2007  DATE OF DISCHARGE:                               OPERATIVE REPORT   PROCEDURE:  Colonoscopy.   INDICATIONS:  Colon polyps.   ANESTHESIA:  Fentanyl 75 mcg, Versed 5 mg.   PROCEDURE:  With the patient mildly sedated in the left lateral  decubitus position the Pentax videoscopic colonoscope was inserted in  the rectum after a normal rectal exam and passed under direct vision to  the cecum identified by ileocecal valve and appendiceal orifice both of  which were photographed.  In the cecum was a polyp that was photographed  and removed using snare cautery technique at a setting of 20/150 blended  current.  Tissue was retrieved by suctioning it through an endoscope.  The endoscope was then withdrawn taking circumferential views of the  remaining colonic mucosa stopping only in the rectum which appeared  normal on direct and retroflexed view.  The endoscope was straightened  and withdrawn.  The patient's vital signs and pulse oximeter remained  stable.  The patient tolerated the procedure well without apparent  complications.   FINDINGS:  Polyps cecum, otherwise unremarkable exam.  Await biopsy  report.  The patient will call me for results and follow-up with me as  an outpatient.           ______________________________  Georgiana Spinner, M.D.     GMO/MEDQ  D:  02/10/2007  T:  02/10/2007  Job:  [id number]

## 2010-07-11 ENCOUNTER — Encounter (HOSPITAL_COMMUNITY): Payer: Self-pay

## 2010-07-13 ENCOUNTER — Encounter (HOSPITAL_COMMUNITY): Payer: Self-pay

## 2010-07-13 NOTE — Op Note (Signed)
NAME:  Samuel Coleman, Samuel Coleman NO.:  [id number]   MEDICAL RECORD NO.:  [id number]                   PATIENT TYPE:  AMB   LOCATION:  ENDO                                 FACILITY:  [facility]   PHYSICIAN:  Georgiana Spinner, M.D.                 DATE OF BIRTH:  [birth year]   DATE OF PROCEDURE:  05/05/2003  DATE OF DISCHARGE:                                 OPERATIVE REPORT   PROCEDURE:  Colonoscopy.   INDICATIONS:  Colon polyp.   ANESTHESIA:  None further given.   DESCRIPTION OF PROCEDURE:  With the patient mildly sedated in the left  lateral decubitus position, the Olympus videoscopic colonoscope was inserted  into the rectum and passed under direct vision to the cecum, identified by  ileocecal valve and appendiceal orifice, both of which were photographed.  From this point the colonoscope was slowly withdrawn, taking circumferential  views of the colonic mucosa, stopping first in the cecum, where a polyp was  seen, photographed, and removed using snare cautery technique, setting of  20/200 with the ERBE pulse generator.  Tissue was retrieved for pathology.  The endoscope was then, as noted, withdrawn all the way to the rectum, which  appeared normal on direct and on retroflexed view showed hemorrhoids.  The  endoscope was straightened and withdrawn.  The patient's vital signs and  pulse oximetry remained stable.  The patient tolerated the procedure well  without apparent complications.   FINDINGS:  1. Internal hemorrhoids.  2. A polyp of cecum.  3. Otherwise unremarkable exam.   PLAN:  Await biopsy report.  The patient will call me for results and follow  up with me as an outpatient.                                               Georgiana Spinner, M.D.    GMO/MEDQ  D:  05/05/2003  T:  05/06/2003  Job:  [id number]

## 2010-07-13 NOTE — Op Note (Signed)
[place]  Patient:    Samuel Coleman                   MRN: [id number] Proc. Date: 01/01/99 Adm. Date:  [id number] Attending:  Carlean Purl CC:         Janae Bridgeman. Eloise Harman., M.D.                           Operative Report  PREOPERATIVE DIAGNOSIS:  Left neck mass.  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  OPERATION:  Excision of deep left neck mass.  SURGEON:  Kristine Garbe. Ezzard Standing, M.D.  ANESTHESIA:  General.  COMPLICATIONS:  None.  BRIEF CLINICAL NOTE:  The patient is a 65 year old gentleman who noted a left neck mass for about four or five months now.  He states that it has gradually enlarged in size.  The mass measured approximately 2 cm to 2.5 cm in size, mid left neck. He is taken to the operating room at this time for excision of deep left neck mass.  DESCRIPTION OF PROCEDURE:  After adequate endotracheal anesthesia, the patients  left neck was prepped with Betadine solution.  The proposed incision site was injected with 3 cc of Xylocaine with epinephrine.  An incision was made in the id left neck.  The platysmal muscle was transected and the mass was found deep to he platysmal muscle.  The mass was consistent with a lipoma.  This was dissected out and hemostasis was obtained with the cautery.  The mass was sent to pathology. The defect was closed with 3-0 chromic sutures to reapproximate the platysmal muscle, and 5-0 nylon on the skin.  A sterile dressing was applied.  The patient was awakened from anesthesia and transferred to the recovery room postoperative doing well.  DISPOSITION:  The patient was discharged home later this morning, and will follow up in my office in one week to have the sutures removed, and review pathology. He is given Tylenol and Tylenol #3 p.r.n. pain. DD:  01/01/99 TD:  01/02/99 Job: [id number]

## 2010-07-13 NOTE — Op Note (Signed)
NAME:  Samuel Coleman, Samuel Coleman NO.:  [id number]   MEDICAL RECORD NO.:  [id number]                   PATIENT TYPE:  AMB   LOCATION:  ENDO                                 FACILITY:  [facility]   PHYSICIAN:  Georgiana Spinner, M.D.                 DATE OF BIRTH:  1946-02-08   DATE OF PROCEDURE:  05/05/2003  DATE OF DISCHARGE:                                 OPERATIVE REPORT   PROCEDURE:  Upper endoscopy.   INDICATIONS:  Dysphagia.   ANESTHESIA:  Demerol 60, Versed 6 mg.   DESCRIPTION OF PROCEDURE:  With the patient mildly sedated in the left  lateral decubitus position, the Olympus videoscopic endoscope was inserted  in the mouth, passed under direct vision through the esophagus, which  appeared normal.  The distal esophagus was approached and no Barrett's seen.  Photograph taken.  We entered the stomach.  Fundus, body, antrum appeared  normal.  Duodenal bulb showed erosions that were photographed.  The second  portion of the duodenum appeared normal.  From this point the endoscope was  slowly withdrawn taking circumferential views of the duodenal mucosa until  the endoscope was pulled back in the stomach, placed in retroflexion to view  the stomach from below.  The endoscope was straightened and a CLO biopsy was  taken for Helicobacter from the antrum.  Subsequently the guidewire was  passed, the endoscope removed.  Over the guidewire was easily passed an 18  Savary dilator.  With this when it was removed, I removed the guidewire.  The patient's vital signs and pulse oximetry remained stable.  The patient  tolerated the procedure well without apparent complications.   FINDINGS:  Erosions of duodenal bulb, CLO biopsy taken, and patient dilated  to 18 Savary, no heme on the dilator.   PLAN:  Await clinical response.  The patient will call me for results of  biopsy and follow up with me as an outpatient.  Proceed to colonoscopy as  planned.                         Georgiana Spinner, M.D.    GMO/MEDQ  D:  05/05/2003  T:  05/06/2003  Job:  [id number]

## 2010-07-16 ENCOUNTER — Encounter (HOSPITAL_COMMUNITY): Payer: Self-pay

## 2010-07-18 ENCOUNTER — Encounter: Payer: Self-pay | Admitting: Cardiovascular Disease

## 2010-07-18 ENCOUNTER — Encounter (HOSPITAL_COMMUNITY): Payer: Self-pay

## 2010-07-18 ENCOUNTER — Encounter: Payer: Self-pay | Admitting: *Deleted

## 2010-07-19 ENCOUNTER — Ambulatory Visit (INDEPENDENT_AMBULATORY_CARE_PROVIDER_SITE_OTHER): Payer: Medicare Other | Admitting: Cardiovascular Disease

## 2010-07-19 ENCOUNTER — Encounter: Payer: Self-pay | Admitting: Cardiovascular Disease

## 2010-07-19 VITALS — BP 128/80 | HR 75 | Resp 17 | Ht 69.0 in | Wt 203.8 lb

## 2010-07-19 DIAGNOSIS — I2510 Atherosclerotic heart disease of native coronary artery without angina pectoris: Secondary | ICD-10-CM

## 2010-07-19 DIAGNOSIS — I10 Essential (primary) hypertension: Secondary | ICD-10-CM

## 2010-07-19 DIAGNOSIS — I712 Thoracic aortic aneurysm, without rupture: Secondary | ICD-10-CM

## 2010-07-19 MED ORDER — LISINOPRIL 20 MG PO TABS: 20.0000 mg | ORAL_TABLET | Freq: Every day | ORAL | Status: DC

## 2010-07-19 NOTE — Assessment & Plan Note (Signed)
Stable by CTA May 2011, November 2011 and dating back to February 2009. Will repeat CTA in one year. If no change, will need f/u every 2-3 years.

## 2010-07-19 NOTE — Patient Instructions (Signed)
Your physician recommends that you schedule a follow-up appointment in: 6 months with Dr. Clifton James  Your physician has recommended you make the following change in your medication: INCREASE LISINOPRIL to 20 mg daily.

## 2010-07-19 NOTE — Progress Notes (Signed)
History of Present Illness: [name] admitted to [facility] 01/01/10 with an acute inferior STEMI. Emergent cardiac cath after code STEMI called. He was found to have severe three vessel CAD. He underwent 3V CABG later that day with Dr. Laneta Simmers performing. (LIMA to LAD, SVG to OM, SVG to RCA). He did well in the immediate post-operative period. He was seen in the ED on 01/13/10 after he coughed and felt chest pain. CT scan did not show sternal disunion. Dr. Edwyna Shell was called and felt he was ok to go home. EKG was unchanged. He called our office on 01/16/10 with c/o vague pain over left chest wall. He was seen in the ED by Dr. Daleen Squibb and his pain was felt to be musculskeletal or related to the sternal wound as a part of normal healing. He was given pain meds, xanax and reassured. The pain was not similar to the pain he had before his bypass.   He is here today for follow up. No chest pain or SOB. No dizziness or palpitations. He does have soreness in his chest  He has been in cardiac rehab without any trouble.     Past Medical History  Diagnosis Date  . CAD (coronary artery disease)     s/p 3V CABG 01/01/10 (LIMA to LAD, SVG to OM, SVG to RCA- Dr.Bartle)  . Thoracic aortic aneurysm      stable on CT 01/13/10  . Hyperlipidemia   . Gout     Past Surgical History  Procedure Date  . Coronary artery bypass graft   . Tonsillectomy   . Excision of deep left neck mass. surgeon:  christopher e. newman, m.d.     Current Outpatient Prescriptions  Medication Sig Dispense Refill  . allopurinol (ZYLOPRIM) 100 MG tablet Take 300 mg by mouth daily.       Marland Kitchen aspirin 325 MG tablet Take 325 mg by mouth daily.        Marland Kitchen dexlansoprazole (DEXILANT) 60 MG capsule Take 60 mg by mouth daily.        Marland Kitchen lisinopril (PRINIVIL,ZESTRIL) 10 MG tablet Take 10 mg by mouth daily.        . metoprolol (LOPRESSOR) 50 MG tablet Take 50 mg by mouth 2 (two) times daily.        . Multiple Vitamin (MULTIVITAMIN) capsule Take 1  capsule by mouth daily.        . simvastatin (ZOCOR) 20 MG tablet Take 40 mg by mouth at bedtime.       . vitamin C (ASCORBIC ACID) 500 MG tablet Take 500 mg by mouth daily.        . vitamin E 400 UNIT capsule Take 400 Units by mouth daily.        Marland Kitchen DISCONTD: ALPRAZolam (XANAX) 0.25 MG tablet Take 0.25 mg by mouth at bedtime as needed.        Marland Kitchen DISCONTD: oxycodone (OXY-IR) 5 MG capsule Take 5 mg by mouth every 4 (four) hours as needed.          No Known Allergies  History   Social History  . Marital Status: Married    Spouse Name: N/A    Number of Children: N/A  . Years of Education: N/A   Occupational History  . Not on file.   Social History Main Topics  . Smoking status: Former Smoker    Quit date: 02/25/1974  . Smokeless tobacco: Not on file  . Alcohol Use: No  . Drug  Use: No  . Sexually Active: Not on file   Other Topics Concern  . Not on file   Social History Narrative  . No narrative on file    Family History  Problem Relation Age of Onset  . Heart failure Mother   . Pneumonia Father     Review of Systems:  As stated in the HPI and otherwise negative.   BP 128/80  Pulse 75  Resp 17  Ht 5\' 9"  (1.753 m)  Wt 203 lb 12.8 oz (92.443 kg)  BMI 30.10 kg/m2  Physical Examination: General: Well developed, well nourished, NAD HEENT: OP clear, mucus membranes moist SKIN: warm, dry. No rashes. Neuro: No focal deficits Musculoskeletal: Muscle strength 5/5 all ext Psychiatric: Mood and affect normal Neck: No JVD, no carotid bruits, no thyromegaly, no lymphadenopathy. Lungs:Clear bilaterally, no wheezes, rhonci, crackles Cardiovascular: Regular rate and rhythm. No murmurs, gallops or rubs. Abdomen:Soft. Bowel sounds present. Non-tender.  Extremities: No lower extremity edema. Pulses are 2 + in the bilateral DP/PT.  EKG:NSR, rate 75bpm.

## 2010-07-20 ENCOUNTER — Encounter (HOSPITAL_COMMUNITY): Payer: Self-pay

## 2010-07-23 ENCOUNTER — Encounter (HOSPITAL_COMMUNITY): Payer: Self-pay

## 2010-07-25 ENCOUNTER — Encounter: Payer: Self-pay | Admitting: Cardiovascular Disease

## 2010-07-25 ENCOUNTER — Encounter (HOSPITAL_COMMUNITY): Payer: Self-pay

## 2010-07-27 ENCOUNTER — Encounter (HOSPITAL_COMMUNITY): Payer: Self-pay | Attending: Cardiovascular Disease

## 2010-07-27 DIAGNOSIS — I2510 Atherosclerotic heart disease of native coronary artery without angina pectoris: Secondary | ICD-10-CM | POA: Insufficient documentation

## 2010-07-27 DIAGNOSIS — I712 Thoracic aortic aneurysm, without rupture, unspecified: Secondary | ICD-10-CM | POA: Insufficient documentation

## 2010-07-27 DIAGNOSIS — K219 Gastro-esophageal reflux disease without esophagitis: Secondary | ICD-10-CM | POA: Insufficient documentation

## 2010-07-27 DIAGNOSIS — Z951 Presence of aortocoronary bypass graft: Secondary | ICD-10-CM | POA: Insufficient documentation

## 2010-07-27 DIAGNOSIS — I214 Non-ST elevation (NSTEMI) myocardial infarction: Secondary | ICD-10-CM | POA: Insufficient documentation

## 2010-07-27 DIAGNOSIS — E785 Hyperlipidemia, unspecified: Secondary | ICD-10-CM | POA: Insufficient documentation

## 2010-07-27 DIAGNOSIS — I10 Essential (primary) hypertension: Secondary | ICD-10-CM | POA: Insufficient documentation

## 2010-07-27 DIAGNOSIS — Z5189 Encounter for other specified aftercare: Secondary | ICD-10-CM | POA: Insufficient documentation

## 2010-07-30 ENCOUNTER — Encounter (HOSPITAL_COMMUNITY): Payer: Self-pay

## 2010-08-01 ENCOUNTER — Encounter (HOSPITAL_COMMUNITY): Payer: Self-pay

## 2010-08-03 ENCOUNTER — Encounter (HOSPITAL_COMMUNITY): Payer: Self-pay

## 2010-08-06 ENCOUNTER — Encounter (HOSPITAL_COMMUNITY): Payer: Self-pay

## 2010-08-08 ENCOUNTER — Encounter (HOSPITAL_COMMUNITY): Payer: Self-pay

## 2010-08-10 ENCOUNTER — Encounter (HOSPITAL_COMMUNITY): Payer: Self-pay

## 2010-08-13 ENCOUNTER — Encounter (HOSPITAL_COMMUNITY): Payer: Self-pay

## 2010-08-15 ENCOUNTER — Encounter (HOSPITAL_COMMUNITY): Payer: Self-pay

## 2010-08-17 ENCOUNTER — Encounter (HOSPITAL_COMMUNITY): Payer: Self-pay

## 2010-08-20 ENCOUNTER — Encounter (HOSPITAL_COMMUNITY): Payer: Self-pay

## 2010-08-22 ENCOUNTER — Encounter (HOSPITAL_COMMUNITY): Payer: Self-pay

## 2010-08-24 ENCOUNTER — Encounter (HOSPITAL_COMMUNITY): Payer: Self-pay

## 2010-08-31 ENCOUNTER — Encounter (HOSPITAL_COMMUNITY): Payer: Self-pay | Attending: Cardiovascular Disease

## 2010-08-31 DIAGNOSIS — I10 Essential (primary) hypertension: Secondary | ICD-10-CM | POA: Insufficient documentation

## 2010-08-31 DIAGNOSIS — K219 Gastro-esophageal reflux disease without esophagitis: Secondary | ICD-10-CM | POA: Insufficient documentation

## 2010-08-31 DIAGNOSIS — Z951 Presence of aortocoronary bypass graft: Secondary | ICD-10-CM | POA: Insufficient documentation

## 2010-08-31 DIAGNOSIS — Z5189 Encounter for other specified aftercare: Secondary | ICD-10-CM | POA: Insufficient documentation

## 2010-08-31 DIAGNOSIS — I712 Thoracic aortic aneurysm, without rupture, unspecified: Secondary | ICD-10-CM | POA: Insufficient documentation

## 2010-08-31 DIAGNOSIS — I214 Non-ST elevation (NSTEMI) myocardial infarction: Secondary | ICD-10-CM | POA: Insufficient documentation

## 2010-08-31 DIAGNOSIS — E785 Hyperlipidemia, unspecified: Secondary | ICD-10-CM | POA: Insufficient documentation

## 2010-08-31 DIAGNOSIS — I2510 Atherosclerotic heart disease of native coronary artery without angina pectoris: Secondary | ICD-10-CM | POA: Insufficient documentation

## 2010-09-03 ENCOUNTER — Encounter (HOSPITAL_COMMUNITY): Payer: Self-pay

## 2010-09-05 ENCOUNTER — Encounter (HOSPITAL_COMMUNITY): Payer: Self-pay

## 2010-09-07 ENCOUNTER — Encounter (HOSPITAL_COMMUNITY): Payer: Self-pay

## 2010-09-10 ENCOUNTER — Encounter (HOSPITAL_COMMUNITY): Payer: Self-pay

## 2010-09-12 ENCOUNTER — Encounter (HOSPITAL_COMMUNITY): Payer: Self-pay

## 2010-09-14 ENCOUNTER — Encounter (HOSPITAL_COMMUNITY): Payer: Self-pay

## 2010-09-17 ENCOUNTER — Encounter (HOSPITAL_COMMUNITY): Payer: Self-pay

## 2010-09-19 ENCOUNTER — Encounter (HOSPITAL_COMMUNITY): Payer: Self-pay

## 2010-09-21 ENCOUNTER — Encounter (HOSPITAL_COMMUNITY): Payer: Self-pay

## 2010-09-24 ENCOUNTER — Encounter (HOSPITAL_COMMUNITY): Payer: Self-pay

## 2010-09-26 ENCOUNTER — Encounter (HOSPITAL_COMMUNITY): Payer: Self-pay | Attending: Cardiovascular Disease

## 2010-09-26 DIAGNOSIS — I214 Non-ST elevation (NSTEMI) myocardial infarction: Secondary | ICD-10-CM | POA: Insufficient documentation

## 2010-09-26 DIAGNOSIS — Z5189 Encounter for other specified aftercare: Secondary | ICD-10-CM | POA: Insufficient documentation

## 2010-09-26 DIAGNOSIS — I10 Essential (primary) hypertension: Secondary | ICD-10-CM | POA: Insufficient documentation

## 2010-09-26 DIAGNOSIS — I2510 Atherosclerotic heart disease of native coronary artery without angina pectoris: Secondary | ICD-10-CM | POA: Insufficient documentation

## 2010-09-26 DIAGNOSIS — I712 Thoracic aortic aneurysm, without rupture, unspecified: Secondary | ICD-10-CM | POA: Insufficient documentation

## 2010-09-26 DIAGNOSIS — K219 Gastro-esophageal reflux disease without esophagitis: Secondary | ICD-10-CM | POA: Insufficient documentation

## 2010-09-26 DIAGNOSIS — E785 Hyperlipidemia, unspecified: Secondary | ICD-10-CM | POA: Insufficient documentation

## 2010-09-26 DIAGNOSIS — Z951 Presence of aortocoronary bypass graft: Secondary | ICD-10-CM | POA: Insufficient documentation

## 2010-09-28 ENCOUNTER — Encounter (HOSPITAL_COMMUNITY): Payer: Self-pay

## 2010-10-01 ENCOUNTER — Encounter (HOSPITAL_COMMUNITY): Payer: Self-pay

## 2010-10-03 ENCOUNTER — Encounter (HOSPITAL_COMMUNITY): Payer: Self-pay

## 2010-10-05 ENCOUNTER — Encounter (HOSPITAL_COMMUNITY): Payer: Self-pay

## 2010-10-08 ENCOUNTER — Encounter (HOSPITAL_COMMUNITY): Payer: Self-pay

## 2010-10-10 ENCOUNTER — Encounter (HOSPITAL_COMMUNITY): Payer: Self-pay

## 2010-10-12 ENCOUNTER — Encounter (HOSPITAL_COMMUNITY): Payer: Self-pay

## 2010-10-15 ENCOUNTER — Encounter (HOSPITAL_COMMUNITY): Payer: Self-pay

## 2010-10-17 ENCOUNTER — Encounter (HOSPITAL_COMMUNITY): Payer: Self-pay

## 2010-10-19 ENCOUNTER — Encounter (HOSPITAL_COMMUNITY): Payer: Self-pay

## 2010-10-22 ENCOUNTER — Encounter (HOSPITAL_COMMUNITY): Payer: Self-pay

## 2010-10-24 ENCOUNTER — Encounter (HOSPITAL_COMMUNITY): Payer: Self-pay

## 2010-10-26 ENCOUNTER — Encounter (HOSPITAL_COMMUNITY): Payer: Self-pay

## 2010-10-29 ENCOUNTER — Encounter (HOSPITAL_COMMUNITY): Payer: Self-pay | Attending: Cardiovascular Disease

## 2010-10-29 DIAGNOSIS — Z951 Presence of aortocoronary bypass graft: Secondary | ICD-10-CM | POA: Insufficient documentation

## 2010-10-29 DIAGNOSIS — E785 Hyperlipidemia, unspecified: Secondary | ICD-10-CM | POA: Insufficient documentation

## 2010-10-29 DIAGNOSIS — I214 Non-ST elevation (NSTEMI) myocardial infarction: Secondary | ICD-10-CM | POA: Insufficient documentation

## 2010-10-29 DIAGNOSIS — Z5189 Encounter for other specified aftercare: Secondary | ICD-10-CM | POA: Insufficient documentation

## 2010-10-29 DIAGNOSIS — I2510 Atherosclerotic heart disease of native coronary artery without angina pectoris: Secondary | ICD-10-CM | POA: Insufficient documentation

## 2010-10-29 DIAGNOSIS — K219 Gastro-esophageal reflux disease without esophagitis: Secondary | ICD-10-CM | POA: Insufficient documentation

## 2010-10-29 DIAGNOSIS — I712 Thoracic aortic aneurysm, without rupture, unspecified: Secondary | ICD-10-CM | POA: Insufficient documentation

## 2010-10-29 DIAGNOSIS — I10 Essential (primary) hypertension: Secondary | ICD-10-CM | POA: Insufficient documentation

## 2010-10-31 ENCOUNTER — Encounter (HOSPITAL_COMMUNITY): Payer: Self-pay

## 2010-11-02 ENCOUNTER — Encounter (HOSPITAL_COMMUNITY): Payer: Self-pay

## 2010-11-05 ENCOUNTER — Encounter (HOSPITAL_COMMUNITY): Payer: Self-pay

## 2010-11-07 ENCOUNTER — Encounter (HOSPITAL_COMMUNITY): Payer: Self-pay

## 2010-11-09 ENCOUNTER — Encounter (HOSPITAL_COMMUNITY): Payer: Self-pay

## 2010-11-12 ENCOUNTER — Encounter (HOSPITAL_COMMUNITY): Payer: Self-pay

## 2010-11-14 ENCOUNTER — Encounter (HOSPITAL_COMMUNITY): Payer: Self-pay

## 2010-11-16 ENCOUNTER — Encounter (HOSPITAL_COMMUNITY): Payer: Self-pay

## 2010-11-19 ENCOUNTER — Encounter (HOSPITAL_COMMUNITY): Payer: Self-pay

## 2010-11-21 ENCOUNTER — Encounter (HOSPITAL_COMMUNITY): Payer: Self-pay

## 2010-11-23 ENCOUNTER — Encounter (HOSPITAL_COMMUNITY): Payer: Self-pay

## 2010-11-26 ENCOUNTER — Telehealth: Payer: Self-pay | Admitting: Cardiovascular Disease

## 2010-11-26 ENCOUNTER — Encounter (HOSPITAL_COMMUNITY): Payer: Self-pay | Attending: Cardiovascular Disease

## 2010-11-26 DIAGNOSIS — Z5189 Encounter for other specified aftercare: Secondary | ICD-10-CM | POA: Insufficient documentation

## 2010-11-26 DIAGNOSIS — E785 Hyperlipidemia, unspecified: Secondary | ICD-10-CM | POA: Insufficient documentation

## 2010-11-26 DIAGNOSIS — I2510 Atherosclerotic heart disease of native coronary artery without angina pectoris: Secondary | ICD-10-CM | POA: Insufficient documentation

## 2010-11-26 DIAGNOSIS — I214 Non-ST elevation (NSTEMI) myocardial infarction: Secondary | ICD-10-CM | POA: Insufficient documentation

## 2010-11-26 DIAGNOSIS — I712 Thoracic aortic aneurysm, without rupture, unspecified: Secondary | ICD-10-CM | POA: Insufficient documentation

## 2010-11-26 DIAGNOSIS — I10 Essential (primary) hypertension: Secondary | ICD-10-CM | POA: Insufficient documentation

## 2010-11-26 DIAGNOSIS — Z951 Presence of aortocoronary bypass graft: Secondary | ICD-10-CM | POA: Insufficient documentation

## 2010-11-26 DIAGNOSIS — K219 Gastro-esophageal reflux disease without esophagitis: Secondary | ICD-10-CM | POA: Insufficient documentation

## 2010-11-26 NOTE — Telephone Encounter (Signed)
Spoke with pt who reports at last office visit the possibility of him having a carotid ultrasound was discussed. Office visit from May 2012 reviewed and I do not see this in future orders/plans. Pt scheduled to see Dr. Clifton James in November.  Will review with Dr. Clifton James

## 2010-11-26 NOTE — Telephone Encounter (Signed)
Can we let him know that I will discuss at his f/u appt in November. Thanks ,chris

## 2010-11-26 NOTE — Telephone Encounter (Signed)
Pt aware and is agreeable with this plan

## 2010-11-26 NOTE — Telephone Encounter (Signed)
Pt wants to know when he is supposed to get [place] of his carotid artery

## 2010-11-28 ENCOUNTER — Encounter (HOSPITAL_COMMUNITY): Payer: Self-pay

## 2010-11-30 ENCOUNTER — Encounter (HOSPITAL_COMMUNITY): Payer: Self-pay

## 2010-12-03 ENCOUNTER — Encounter (HOSPITAL_COMMUNITY): Payer: Self-pay

## 2010-12-05 ENCOUNTER — Encounter (HOSPITAL_COMMUNITY): Payer: Self-pay

## 2010-12-07 ENCOUNTER — Encounter (HOSPITAL_COMMUNITY): Payer: Self-pay

## 2010-12-10 ENCOUNTER — Encounter (HOSPITAL_COMMUNITY): Payer: Self-pay

## 2010-12-12 ENCOUNTER — Encounter (HOSPITAL_COMMUNITY): Payer: Self-pay

## 2010-12-14 ENCOUNTER — Encounter (HOSPITAL_COMMUNITY): Payer: Self-pay

## 2010-12-17 ENCOUNTER — Encounter (HOSPITAL_COMMUNITY): Payer: Self-pay

## 2010-12-19 ENCOUNTER — Encounter (HOSPITAL_COMMUNITY): Payer: Self-pay

## 2010-12-21 ENCOUNTER — Encounter (HOSPITAL_COMMUNITY): Payer: Self-pay

## 2010-12-24 ENCOUNTER — Encounter (HOSPITAL_COMMUNITY): Payer: Self-pay

## 2010-12-26 ENCOUNTER — Encounter (HOSPITAL_COMMUNITY): Payer: Self-pay

## 2010-12-28 ENCOUNTER — Encounter (HOSPITAL_COMMUNITY): Payer: Self-pay

## 2010-12-28 DIAGNOSIS — K219 Gastro-esophageal reflux disease without esophagitis: Secondary | ICD-10-CM | POA: Insufficient documentation

## 2010-12-28 DIAGNOSIS — I2510 Atherosclerotic heart disease of native coronary artery without angina pectoris: Secondary | ICD-10-CM | POA: Insufficient documentation

## 2010-12-28 DIAGNOSIS — E785 Hyperlipidemia, unspecified: Secondary | ICD-10-CM | POA: Insufficient documentation

## 2010-12-28 DIAGNOSIS — I712 Thoracic aortic aneurysm, without rupture, unspecified: Secondary | ICD-10-CM | POA: Insufficient documentation

## 2010-12-28 DIAGNOSIS — I214 Non-ST elevation (NSTEMI) myocardial infarction: Secondary | ICD-10-CM | POA: Insufficient documentation

## 2010-12-28 DIAGNOSIS — Z951 Presence of aortocoronary bypass graft: Secondary | ICD-10-CM | POA: Insufficient documentation

## 2010-12-28 DIAGNOSIS — Z5189 Encounter for other specified aftercare: Secondary | ICD-10-CM | POA: Insufficient documentation

## 2010-12-28 DIAGNOSIS — I10 Essential (primary) hypertension: Secondary | ICD-10-CM | POA: Insufficient documentation

## 2010-12-31 ENCOUNTER — Encounter (HOSPITAL_COMMUNITY): Payer: Self-pay

## 2011-01-02 ENCOUNTER — Encounter (HOSPITAL_COMMUNITY): Payer: Self-pay

## 2011-01-04 ENCOUNTER — Encounter (HOSPITAL_COMMUNITY): Payer: Self-pay

## 2011-01-07 ENCOUNTER — Encounter (HOSPITAL_COMMUNITY): Payer: Self-pay

## 2011-01-09 ENCOUNTER — Encounter (HOSPITAL_COMMUNITY): Payer: Self-pay

## 2011-01-11 ENCOUNTER — Encounter (HOSPITAL_COMMUNITY): Payer: Self-pay

## 2011-01-14 ENCOUNTER — Telehealth (HOSPITAL_COMMUNITY): Payer: Self-pay | Admitting: *Deleted

## 2011-01-14 ENCOUNTER — Encounter (HOSPITAL_COMMUNITY): Admission: RE | Admit: 2011-01-14 | Payer: Self-pay | Source: Ambulatory Visit

## 2011-01-15 ENCOUNTER — Other Ambulatory Visit: Payer: Self-pay | Admitting: Cardiovascular Disease

## 2011-01-16 ENCOUNTER — Ambulatory Visit (INDEPENDENT_AMBULATORY_CARE_PROVIDER_SITE_OTHER): Payer: Medicare Other | Admitting: Cardiovascular Disease

## 2011-01-16 ENCOUNTER — Encounter: Payer: Self-pay | Admitting: Cardiovascular Disease

## 2011-01-16 ENCOUNTER — Encounter (HOSPITAL_COMMUNITY)
Admission: RE | Admit: 2011-01-16 | Discharge: 2011-01-16 | Disposition: A | Discharge status: Home / Self Care | Payer: Self-pay | Source: Ambulatory Visit | Attending: Cardiovascular Disease | Admitting: Cardiovascular Disease

## 2011-01-16 VITALS — BP 124/80 | HR 70 | Ht 70.0 in | Wt 189.0 lb

## 2011-01-16 DIAGNOSIS — I2510 Atherosclerotic heart disease of native coronary artery without angina pectoris: Secondary | ICD-10-CM

## 2011-01-16 DIAGNOSIS — R0989 Other specified symptoms and signs involving the circulatory and respiratory systems: Secondary | ICD-10-CM | POA: Insufficient documentation

## 2011-01-16 DIAGNOSIS — I712 Thoracic aortic aneurysm, without rupture: Secondary | ICD-10-CM

## 2011-01-16 NOTE — Assessment & Plan Note (Signed)
Will repeat CTA chest to follow thoracic aortic aneurysm.

## 2011-01-16 NOTE — Patient Instructions (Signed)
Your physician wants you to follow-up in: 12 months.  You will receive a reminder letter in the mail two months in advance. If you don't receive a letter, please call our office to schedule the follow-up appointment.  Non-Cardiac CT Angiography (CTA), is a special type of CT scan that uses a computer to produce multi-dimensional views of major blood vessels throughout the body. In CT angiography, a contrast material is injected through an IV to help visualize the blood vessels  Your physician has requested that you have a carotid duplex. This test is an ultrasound of the carotid arteries in your neck. It looks at blood flow through these arteries that supply the brain with blood. Allow one hour for this exam. There are no restrictions or special instructions.

## 2011-01-16 NOTE — Progress Notes (Signed)
History of Present Illness:[name] admitted to [facility] 01/01/10 with an acute inferior STEMI. Emergent cardiac cath after code STEMI called. He was found to have severe three vessel CAD. He underwent 3V CABG later that day with Dr. Laneta Simmers performing. (LIMA to LAD, SVG to OM, SVG to RCA). He did well in the immediate post-operative period. He was seen in the ED on 01/13/10 after he coughed and felt chest pain. CT scan did not show sternal disunion. Dr. Edwyna Shell was called and felt he was ok to go home. EKG was unchanged. I last saw him in May 2012. He has done well since then. He thinks he has an abdominal hernia. This has been evaluated by Dr. Selena Batten. He also has a history of HLD and thoracic aortic aneurysm. He is feeling well. No chest pain or SOB. He is active and working 40 hours per week. He is still doing cardiac rehab in the maintenance program.   Lipids on 01/09/11 per primary care with total cholesterol of 117, HDL 52, LDL 51. LFTs were normal on 01/09/11. Renal function normal. He is known to have a thoracic aortic aneurysm.   Primary care is Dr. Pearson Grippe with [facility].   Past Medical History  Diagnosis Date  . CAD (coronary artery disease)     s/p 3V CABG 01/01/10 (LIMA to LAD, SVG to OM, SVG to RCA- Dr.Bartle)  . Thoracic aortic aneurysm      stable on CT 01/13/10  . Hyperlipidemia   . Gout     Past Surgical History  Procedure Date  . Coronary artery bypass graft   . Tonsillectomy   . Excision of deep left neck mass. surgeon:  Shawnte Winton e. newman, m.d.     Current Outpatient Prescriptions  Medication Sig Dispense Refill  . allopurinol (ZYLOPRIM) 100 MG tablet Take 300 mg by mouth daily.       Marland Kitchen aspirin 325 MG tablet Take 160 mg by mouth daily.       Marland Kitchen dexlansoprazole (DEXILANT) 60 MG capsule Take 60 mg by mouth daily.        Marland Kitchen lisinopril (PRINIVIL,ZESTRIL) 20 MG tablet Take 1 tablet (20 mg total) by mouth daily.  30 tablet  8  . metoprolol  (LOPRESSOR) 50 MG tablet TAKE 1 TABLET BY MOUTH TWICE A DAY  60 tablet  10  . Multiple Vitamin (MULTIVITAMIN) capsule Take 1 capsule by mouth daily.        . simvastatin (ZOCOR) 20 MG tablet Take 40 mg by mouth at bedtime.       . vitamin C (ASCORBIC ACID) 500 MG tablet Take 500 mg by mouth daily.        . vitamin E 400 UNIT capsule Take 400 Units by mouth daily.          No Known Allergies  History   Social History  . Marital Status: Married    Spouse Name: N/A    Number of Children: N/A  . Years of Education: N/A   Occupational History  . Not on file.   Social History Main Topics  . Smoking status: Former Smoker    Quit date: 02/25/1974  . Smokeless tobacco: Not on file  . Alcohol Use: No  . Drug Use: No  . Sexually Active: Not on file   Other Topics Concern  . Not on file   Social History Narrative  . No narrative on file    Family History  Problem Relation Age  of Onset  . Heart failure Mother   . Pneumonia Father     Review of Systems:  As stated in the HPI and otherwise negative.   BP 124/80  Pulse 70  Ht 5\' 10"  (1.778 m)  Wt 189 lb (85.73 kg)  BMI 27.12 kg/m2  Physical Examination: General: Well developed, well nourished, NAD HEENT: OP clear, mucus membranes moist SKIN: warm, dry. No rashes. Neuro: No focal deficits Musculoskeletal: Muscle strength 5/5 all ext Psychiatric: Mood and affect normal Neck: No JVD, no carotid bruits, no thyromegaly, no lymphadenopathy. Lungs:Clear bilaterally, no wheezes, rhonci, crackles Cardiovascular: Regular rate and rhythm. No murmurs, gallops or rubs. Abdomen:Soft. Bowel sounds present. Non-tender.  Extremities: No lower extremity edema. Pulses are 2 + in the bilateral DP/PT.

## 2011-01-16 NOTE — Assessment & Plan Note (Signed)
Stable. Continue current medical therapy. Lower ASA to 81 mg po QDaily.

## 2011-01-16 NOTE — Assessment & Plan Note (Signed)
Will get dopplers to assess.

## 2011-01-18 ENCOUNTER — Encounter (HOSPITAL_COMMUNITY): Payer: Self-pay

## 2011-01-21 ENCOUNTER — Encounter (HOSPITAL_COMMUNITY): Payer: Self-pay

## 2011-01-23 ENCOUNTER — Encounter (HOSPITAL_COMMUNITY)
Admission: RE | Admit: 2011-01-23 | Discharge: 2011-01-23 | Disposition: A | Discharge status: Home / Self Care | Payer: Self-pay | Source: Ambulatory Visit | Attending: Cardiovascular Disease | Admitting: Cardiovascular Disease

## 2011-01-25 ENCOUNTER — Encounter (HOSPITAL_COMMUNITY): Payer: Self-pay

## 2011-01-28 ENCOUNTER — Encounter (HOSPITAL_COMMUNITY): Payer: Self-pay

## 2011-01-28 DIAGNOSIS — I712 Thoracic aortic aneurysm, without rupture, unspecified: Secondary | ICD-10-CM | POA: Insufficient documentation

## 2011-01-28 DIAGNOSIS — I10 Essential (primary) hypertension: Secondary | ICD-10-CM | POA: Insufficient documentation

## 2011-01-28 DIAGNOSIS — Z5189 Encounter for other specified aftercare: Secondary | ICD-10-CM | POA: Insufficient documentation

## 2011-01-28 DIAGNOSIS — Z951 Presence of aortocoronary bypass graft: Secondary | ICD-10-CM | POA: Insufficient documentation

## 2011-01-28 DIAGNOSIS — E785 Hyperlipidemia, unspecified: Secondary | ICD-10-CM | POA: Insufficient documentation

## 2011-01-28 DIAGNOSIS — K219 Gastro-esophageal reflux disease without esophagitis: Secondary | ICD-10-CM | POA: Insufficient documentation

## 2011-01-28 DIAGNOSIS — I214 Non-ST elevation (NSTEMI) myocardial infarction: Secondary | ICD-10-CM | POA: Insufficient documentation

## 2011-01-28 DIAGNOSIS — I2510 Atherosclerotic heart disease of native coronary artery without angina pectoris: Secondary | ICD-10-CM | POA: Insufficient documentation

## 2011-01-30 ENCOUNTER — Encounter (HOSPITAL_COMMUNITY): Payer: Self-pay

## 2011-02-01 ENCOUNTER — Encounter (HOSPITAL_COMMUNITY): Payer: Self-pay

## 2011-02-04 ENCOUNTER — Encounter (HOSPITAL_COMMUNITY): Payer: Self-pay

## 2011-02-06 ENCOUNTER — Encounter (HOSPITAL_COMMUNITY): Payer: Self-pay

## 2011-02-08 ENCOUNTER — Encounter (HOSPITAL_COMMUNITY)
Admission: RE | Admit: 2011-02-08 | Discharge: 2011-02-08 | Disposition: A | Discharge status: Home / Self Care | Payer: Self-pay | Source: Ambulatory Visit | Attending: Cardiovascular Disease | Admitting: Cardiovascular Disease

## 2011-02-11 ENCOUNTER — Encounter (HOSPITAL_COMMUNITY): Payer: Self-pay

## 2011-02-13 ENCOUNTER — Encounter (HOSPITAL_COMMUNITY)
Admission: RE | Admit: 2011-02-13 | Discharge: 2011-02-13 | Disposition: A | Discharge status: Home / Self Care | Payer: Self-pay | Source: Ambulatory Visit | Attending: Cardiovascular Disease | Admitting: Cardiovascular Disease

## 2011-02-15 ENCOUNTER — Encounter (HOSPITAL_COMMUNITY)
Admission: RE | Admit: 2011-02-15 | Discharge: 2011-02-15 | Disposition: A | Discharge status: Home / Self Care | Payer: Self-pay | Source: Ambulatory Visit | Attending: Cardiovascular Disease | Admitting: Cardiovascular Disease

## 2011-02-15 ENCOUNTER — Ambulatory Visit (INDEPENDENT_AMBULATORY_CARE_PROVIDER_SITE_OTHER)
Admission: RE | Admit: 2011-02-15 | Discharge: 2011-02-15 | Disposition: A | Discharge status: Home / Self Care | Payer: Medicare Other | Source: Ambulatory Visit | Attending: Cardiovascular Disease | Admitting: Cardiovascular Disease

## 2011-02-15 ENCOUNTER — Encounter (INDEPENDENT_AMBULATORY_CARE_PROVIDER_SITE_OTHER): Payer: Medicare Other | Admitting: *Deleted

## 2011-02-15 DIAGNOSIS — I6529 Occlusion and stenosis of unspecified carotid artery: Secondary | ICD-10-CM

## 2011-02-15 DIAGNOSIS — R0989 Other specified symptoms and signs involving the circulatory and respiratory systems: Secondary | ICD-10-CM

## 2011-02-15 DIAGNOSIS — I712 Thoracic aortic aneurysm, without rupture: Secondary | ICD-10-CM

## 2011-02-15 MED ORDER — IOHEXOL 300 MG/ML  SOLN
100.0000 mL | Freq: Once | INTRAMUSCULAR | Status: AC | PRN
  Administered 2011-02-15: 100 mL via INTRAVENOUS

## 2011-02-18 ENCOUNTER — Encounter (HOSPITAL_COMMUNITY): Payer: Self-pay

## 2011-02-20 ENCOUNTER — Encounter (HOSPITAL_COMMUNITY): Payer: Self-pay

## 2011-02-22 ENCOUNTER — Encounter (HOSPITAL_COMMUNITY): Payer: Self-pay

## 2011-02-25 ENCOUNTER — Encounter (HOSPITAL_COMMUNITY): Payer: Self-pay

## 2011-02-27 ENCOUNTER — Other Ambulatory Visit: Payer: Self-pay | Admitting: *Deleted

## 2011-02-27 DIAGNOSIS — I6529 Occlusion and stenosis of unspecified carotid artery: Secondary | ICD-10-CM

## 2011-03-01 ENCOUNTER — Encounter (HOSPITAL_COMMUNITY): Payer: Self-pay

## 2011-03-01 DIAGNOSIS — Z951 Presence of aortocoronary bypass graft: Secondary | ICD-10-CM | POA: Insufficient documentation

## 2011-03-01 DIAGNOSIS — I2510 Atherosclerotic heart disease of native coronary artery without angina pectoris: Secondary | ICD-10-CM | POA: Insufficient documentation

## 2011-03-01 DIAGNOSIS — I214 Non-ST elevation (NSTEMI) myocardial infarction: Secondary | ICD-10-CM | POA: Insufficient documentation

## 2011-03-01 DIAGNOSIS — Z5189 Encounter for other specified aftercare: Secondary | ICD-10-CM | POA: Insufficient documentation

## 2011-03-01 DIAGNOSIS — E785 Hyperlipidemia, unspecified: Secondary | ICD-10-CM | POA: Insufficient documentation

## 2011-03-01 DIAGNOSIS — I10 Essential (primary) hypertension: Secondary | ICD-10-CM | POA: Insufficient documentation

## 2011-03-01 DIAGNOSIS — K219 Gastro-esophageal reflux disease without esophagitis: Secondary | ICD-10-CM | POA: Insufficient documentation

## 2011-03-01 DIAGNOSIS — I712 Thoracic aortic aneurysm, without rupture, unspecified: Secondary | ICD-10-CM | POA: Insufficient documentation

## 2011-03-04 ENCOUNTER — Encounter (HOSPITAL_COMMUNITY): Payer: Self-pay

## 2011-03-06 ENCOUNTER — Encounter (HOSPITAL_COMMUNITY): Payer: Self-pay

## 2011-03-08 ENCOUNTER — Encounter (HOSPITAL_COMMUNITY)
Admission: RE | Admit: 2011-03-08 | Discharge: 2011-03-08 | Disposition: A | Discharge status: Home / Self Care | Payer: Self-pay | Source: Ambulatory Visit | Attending: Cardiovascular Disease | Admitting: Cardiovascular Disease

## 2011-03-11 ENCOUNTER — Encounter (HOSPITAL_COMMUNITY): Payer: Self-pay

## 2011-03-13 ENCOUNTER — Encounter (HOSPITAL_COMMUNITY)
Admission: RE | Admit: 2011-03-13 | Discharge: 2011-03-13 | Disposition: A | Discharge status: Home / Self Care | Payer: Self-pay | Source: Ambulatory Visit | Attending: Cardiovascular Disease | Admitting: Cardiovascular Disease

## 2011-03-15 ENCOUNTER — Encounter (HOSPITAL_COMMUNITY): Payer: Self-pay

## 2011-03-18 ENCOUNTER — Encounter (HOSPITAL_COMMUNITY)
Admission: RE | Admit: 2011-03-18 | Discharge: 2011-03-18 | Disposition: A | Discharge status: Home / Self Care | Payer: Self-pay | Source: Ambulatory Visit | Attending: Cardiovascular Disease | Admitting: Cardiovascular Disease

## 2011-03-20 ENCOUNTER — Encounter (HOSPITAL_COMMUNITY): Payer: Self-pay

## 2011-03-22 ENCOUNTER — Encounter (HOSPITAL_COMMUNITY): Payer: Self-pay

## 2011-03-25 ENCOUNTER — Encounter (HOSPITAL_COMMUNITY): Payer: Self-pay

## 2011-03-27 ENCOUNTER — Encounter (HOSPITAL_COMMUNITY)
Admission: RE | Admit: 2011-03-27 | Discharge: 2011-03-27 | Disposition: A | Discharge status: Home / Self Care | Payer: Self-pay | Source: Ambulatory Visit | Attending: Cardiovascular Disease | Admitting: Cardiovascular Disease

## 2011-03-28 ENCOUNTER — Other Ambulatory Visit: Payer: Self-pay | Admitting: Cardiovascular Disease

## 2011-03-29 ENCOUNTER — Encounter (HOSPITAL_COMMUNITY)
Admission: RE | Admit: 2011-03-29 | Discharge: 2011-03-29 | Disposition: A | Discharge status: Home / Self Care | Payer: Self-pay | Source: Ambulatory Visit | Attending: Cardiovascular Disease | Admitting: Cardiovascular Disease

## 2011-03-29 DIAGNOSIS — I712 Thoracic aortic aneurysm, without rupture, unspecified: Secondary | ICD-10-CM | POA: Insufficient documentation

## 2011-03-29 DIAGNOSIS — E785 Hyperlipidemia, unspecified: Secondary | ICD-10-CM | POA: Insufficient documentation

## 2011-03-29 DIAGNOSIS — K219 Gastro-esophageal reflux disease without esophagitis: Secondary | ICD-10-CM | POA: Insufficient documentation

## 2011-03-29 DIAGNOSIS — I2510 Atherosclerotic heart disease of native coronary artery without angina pectoris: Secondary | ICD-10-CM | POA: Insufficient documentation

## 2011-03-29 DIAGNOSIS — I214 Non-ST elevation (NSTEMI) myocardial infarction: Secondary | ICD-10-CM | POA: Insufficient documentation

## 2011-03-29 DIAGNOSIS — Z951 Presence of aortocoronary bypass graft: Secondary | ICD-10-CM | POA: Insufficient documentation

## 2011-03-29 DIAGNOSIS — Z5189 Encounter for other specified aftercare: Secondary | ICD-10-CM | POA: Insufficient documentation

## 2011-03-29 DIAGNOSIS — I10 Essential (primary) hypertension: Secondary | ICD-10-CM | POA: Insufficient documentation

## 2011-04-01 ENCOUNTER — Encounter (HOSPITAL_COMMUNITY)
Admission: RE | Admit: 2011-04-01 | Discharge: 2011-04-01 | Disposition: A | Discharge status: Home / Self Care | Payer: Self-pay | Source: Ambulatory Visit | Attending: Cardiovascular Disease | Admitting: Cardiovascular Disease

## 2011-04-03 ENCOUNTER — Encounter (HOSPITAL_COMMUNITY): Payer: Self-pay

## 2011-04-05 ENCOUNTER — Encounter (HOSPITAL_COMMUNITY): Payer: Self-pay

## 2011-04-08 ENCOUNTER — Encounter (HOSPITAL_COMMUNITY)
Admission: RE | Admit: 2011-04-08 | Discharge: 2011-04-08 | Disposition: A | Discharge status: Home / Self Care | Payer: Self-pay | Source: Ambulatory Visit | Attending: Cardiovascular Disease | Admitting: Cardiovascular Disease

## 2011-04-10 ENCOUNTER — Encounter (HOSPITAL_COMMUNITY)
Admission: RE | Admit: 2011-04-10 | Discharge: 2011-04-10 | Disposition: A | Discharge status: Home / Self Care | Payer: Self-pay | Source: Ambulatory Visit | Attending: Cardiovascular Disease | Admitting: Cardiovascular Disease

## 2011-04-12 ENCOUNTER — Encounter (HOSPITAL_COMMUNITY)
Admission: RE | Admit: 2011-04-12 | Discharge: 2011-04-12 | Disposition: A | Discharge status: Home / Self Care | Payer: Self-pay | Source: Ambulatory Visit | Attending: Cardiovascular Disease | Admitting: Cardiovascular Disease

## 2011-04-15 ENCOUNTER — Encounter (HOSPITAL_COMMUNITY)
Admission: RE | Admit: 2011-04-15 | Discharge: 2011-04-15 | Disposition: A | Discharge status: Home / Self Care | Payer: Self-pay | Source: Ambulatory Visit | Attending: Cardiovascular Disease | Admitting: Cardiovascular Disease

## 2011-04-17 ENCOUNTER — Encounter (HOSPITAL_COMMUNITY)
Admission: RE | Admit: 2011-04-17 | Discharge: 2011-04-17 | Disposition: A | Discharge status: Home / Self Care | Payer: Self-pay | Source: Ambulatory Visit | Attending: Cardiovascular Disease | Admitting: Cardiovascular Disease

## 2011-04-19 ENCOUNTER — Encounter (HOSPITAL_COMMUNITY)
Admission: RE | Admit: 2011-04-19 | Discharge: 2011-04-19 | Disposition: A | Discharge status: Home / Self Care | Payer: Self-pay | Source: Ambulatory Visit | Attending: Cardiovascular Disease | Admitting: Cardiovascular Disease

## 2011-04-22 ENCOUNTER — Encounter (HOSPITAL_COMMUNITY)
Admission: RE | Admit: 2011-04-22 | Discharge: 2011-04-22 | Disposition: A | Discharge status: Home / Self Care | Payer: Self-pay | Source: Ambulatory Visit | Attending: Cardiovascular Disease | Admitting: Cardiovascular Disease

## 2011-04-24 ENCOUNTER — Encounter (HOSPITAL_COMMUNITY): Payer: Self-pay

## 2011-04-24 ENCOUNTER — Other Ambulatory Visit: Payer: Self-pay | Admitting: Internal Medicine

## 2011-04-26 ENCOUNTER — Encounter (HOSPITAL_COMMUNITY): Payer: Self-pay

## 2011-04-26 DIAGNOSIS — E785 Hyperlipidemia, unspecified: Secondary | ICD-10-CM | POA: Insufficient documentation

## 2011-04-26 DIAGNOSIS — Z951 Presence of aortocoronary bypass graft: Secondary | ICD-10-CM | POA: Insufficient documentation

## 2011-04-26 DIAGNOSIS — I214 Non-ST elevation (NSTEMI) myocardial infarction: Secondary | ICD-10-CM | POA: Insufficient documentation

## 2011-04-26 DIAGNOSIS — I712 Thoracic aortic aneurysm, without rupture, unspecified: Secondary | ICD-10-CM | POA: Insufficient documentation

## 2011-04-26 DIAGNOSIS — I10 Essential (primary) hypertension: Secondary | ICD-10-CM | POA: Insufficient documentation

## 2011-04-26 DIAGNOSIS — I2510 Atherosclerotic heart disease of native coronary artery without angina pectoris: Secondary | ICD-10-CM | POA: Insufficient documentation

## 2011-04-26 DIAGNOSIS — K219 Gastro-esophageal reflux disease without esophagitis: Secondary | ICD-10-CM | POA: Insufficient documentation

## 2011-04-26 DIAGNOSIS — Z5189 Encounter for other specified aftercare: Secondary | ICD-10-CM | POA: Insufficient documentation

## 2011-04-29 ENCOUNTER — Ambulatory Visit
Admission: RE | Admit: 2011-04-29 | Discharge: 2011-04-29 | Disposition: A | Discharge status: Home / Self Care | Payer: Self-pay | Source: Ambulatory Visit | Attending: Internal Medicine | Admitting: Internal Medicine

## 2011-04-29 ENCOUNTER — Encounter (HOSPITAL_COMMUNITY)
Admission: RE | Admit: 2011-04-29 | Discharge: 2011-04-29 | Disposition: A | Discharge status: Home / Self Care | Payer: Self-pay | Source: Ambulatory Visit | Attending: Cardiovascular Disease | Admitting: Cardiovascular Disease

## 2011-05-01 ENCOUNTER — Encounter (HOSPITAL_COMMUNITY): Payer: Self-pay

## 2011-05-03 ENCOUNTER — Encounter (HOSPITAL_COMMUNITY): Payer: Self-pay

## 2011-05-06 ENCOUNTER — Encounter (HOSPITAL_COMMUNITY)
Admission: RE | Admit: 2011-05-06 | Discharge: 2011-05-06 | Disposition: A | Discharge status: Home / Self Care | Payer: Self-pay | Source: Ambulatory Visit | Attending: Cardiovascular Disease | Admitting: Cardiovascular Disease

## 2011-05-08 ENCOUNTER — Encounter (HOSPITAL_COMMUNITY)
Admission: RE | Admit: 2011-05-08 | Discharge: 2011-05-08 | Disposition: A | Discharge status: Home / Self Care | Payer: Self-pay | Source: Ambulatory Visit | Attending: Cardiovascular Disease | Admitting: Cardiovascular Disease

## 2011-05-10 ENCOUNTER — Encounter (HOSPITAL_COMMUNITY): Payer: Self-pay

## 2011-05-13 ENCOUNTER — Encounter (HOSPITAL_COMMUNITY)
Admission: RE | Admit: 2011-05-13 | Discharge: 2011-05-13 | Disposition: A | Discharge status: Home / Self Care | Payer: Self-pay | Source: Ambulatory Visit | Attending: Cardiovascular Disease | Admitting: Cardiovascular Disease

## 2011-05-15 ENCOUNTER — Encounter (HOSPITAL_COMMUNITY): Payer: Self-pay

## 2011-05-17 ENCOUNTER — Encounter (HOSPITAL_COMMUNITY): Payer: Self-pay

## 2011-05-20 ENCOUNTER — Encounter (HOSPITAL_COMMUNITY)
Admission: RE | Admit: 2011-05-20 | Discharge: 2011-05-20 | Disposition: A | Discharge status: Home / Self Care | Payer: Self-pay | Source: Ambulatory Visit | Attending: Cardiovascular Disease | Admitting: Cardiovascular Disease

## 2011-05-22 ENCOUNTER — Encounter (HOSPITAL_COMMUNITY)
Admission: RE | Admit: 2011-05-22 | Discharge: 2011-05-22 | Disposition: A | Discharge status: Home / Self Care | Payer: Self-pay | Source: Ambulatory Visit | Attending: Cardiovascular Disease | Admitting: Cardiovascular Disease

## 2011-05-24 ENCOUNTER — Encounter (HOSPITAL_COMMUNITY)
Admission: RE | Admit: 2011-05-24 | Discharge: 2011-05-24 | Disposition: A | Discharge status: Home / Self Care | Payer: Self-pay | Source: Ambulatory Visit | Attending: Cardiovascular Disease | Admitting: Cardiovascular Disease

## 2011-05-27 ENCOUNTER — Encounter (HOSPITAL_COMMUNITY): Payer: Self-pay

## 2011-05-27 DIAGNOSIS — K219 Gastro-esophageal reflux disease without esophagitis: Secondary | ICD-10-CM | POA: Insufficient documentation

## 2011-05-27 DIAGNOSIS — I712 Thoracic aortic aneurysm, without rupture, unspecified: Secondary | ICD-10-CM | POA: Insufficient documentation

## 2011-05-27 DIAGNOSIS — Z951 Presence of aortocoronary bypass graft: Secondary | ICD-10-CM | POA: Insufficient documentation

## 2011-05-27 DIAGNOSIS — I214 Non-ST elevation (NSTEMI) myocardial infarction: Secondary | ICD-10-CM | POA: Insufficient documentation

## 2011-05-27 DIAGNOSIS — Z5189 Encounter for other specified aftercare: Secondary | ICD-10-CM | POA: Insufficient documentation

## 2011-05-27 DIAGNOSIS — I2510 Atherosclerotic heart disease of native coronary artery without angina pectoris: Secondary | ICD-10-CM | POA: Insufficient documentation

## 2011-05-27 DIAGNOSIS — E785 Hyperlipidemia, unspecified: Secondary | ICD-10-CM | POA: Insufficient documentation

## 2011-05-27 DIAGNOSIS — I10 Essential (primary) hypertension: Secondary | ICD-10-CM | POA: Insufficient documentation

## 2011-05-29 ENCOUNTER — Encounter (HOSPITAL_COMMUNITY): Payer: Self-pay

## 2011-05-31 ENCOUNTER — Encounter (HOSPITAL_COMMUNITY): Payer: Self-pay

## 2011-06-03 ENCOUNTER — Encounter (HOSPITAL_COMMUNITY): Payer: Self-pay

## 2011-06-05 ENCOUNTER — Encounter (HOSPITAL_COMMUNITY): Payer: Self-pay

## 2011-06-07 ENCOUNTER — Encounter (HOSPITAL_COMMUNITY)
Admission: RE | Admit: 2011-06-07 | Discharge: 2011-06-07 | Disposition: A | Discharge status: Home / Self Care | Payer: Self-pay | Source: Ambulatory Visit | Attending: Cardiovascular Disease | Admitting: Cardiovascular Disease

## 2011-06-10 ENCOUNTER — Encounter (HOSPITAL_COMMUNITY)
Admission: RE | Admit: 2011-06-10 | Discharge: 2011-06-10 | Disposition: A | Discharge status: Home / Self Care | Payer: Self-pay | Source: Ambulatory Visit | Attending: Cardiovascular Disease | Admitting: Cardiovascular Disease

## 2011-06-12 ENCOUNTER — Encounter (HOSPITAL_COMMUNITY)
Admission: RE | Admit: 2011-06-12 | Discharge: 2011-06-12 | Disposition: A | Discharge status: Home / Self Care | Payer: Self-pay | Source: Ambulatory Visit | Attending: Cardiovascular Disease | Admitting: Cardiovascular Disease

## 2011-06-14 ENCOUNTER — Encounter (HOSPITAL_COMMUNITY)
Admission: RE | Admit: 2011-06-14 | Discharge: 2011-06-14 | Disposition: A | Discharge status: Home / Self Care | Payer: Self-pay | Source: Ambulatory Visit | Attending: Cardiovascular Disease | Admitting: Cardiovascular Disease

## 2011-06-17 ENCOUNTER — Encounter (HOSPITAL_COMMUNITY)
Admission: RE | Admit: 2011-06-17 | Discharge: 2011-06-17 | Disposition: A | Discharge status: Home / Self Care | Payer: Self-pay | Source: Ambulatory Visit | Attending: Cardiovascular Disease | Admitting: Cardiovascular Disease

## 2011-06-19 ENCOUNTER — Encounter (HOSPITAL_COMMUNITY): Payer: Self-pay

## 2011-06-21 ENCOUNTER — Encounter (HOSPITAL_COMMUNITY): Payer: Self-pay

## 2011-06-24 ENCOUNTER — Encounter (HOSPITAL_COMMUNITY): Payer: Self-pay

## 2011-06-26 ENCOUNTER — Encounter (HOSPITAL_COMMUNITY): Payer: Self-pay | Attending: Cardiovascular Disease

## 2011-06-26 DIAGNOSIS — Z951 Presence of aortocoronary bypass graft: Secondary | ICD-10-CM | POA: Insufficient documentation

## 2011-06-26 DIAGNOSIS — I10 Essential (primary) hypertension: Secondary | ICD-10-CM | POA: Insufficient documentation

## 2011-06-26 DIAGNOSIS — I2510 Atherosclerotic heart disease of native coronary artery without angina pectoris: Secondary | ICD-10-CM | POA: Insufficient documentation

## 2011-06-26 DIAGNOSIS — I712 Thoracic aortic aneurysm, without rupture, unspecified: Secondary | ICD-10-CM | POA: Insufficient documentation

## 2011-06-26 DIAGNOSIS — E785 Hyperlipidemia, unspecified: Secondary | ICD-10-CM | POA: Insufficient documentation

## 2011-06-26 DIAGNOSIS — K219 Gastro-esophageal reflux disease without esophagitis: Secondary | ICD-10-CM | POA: Insufficient documentation

## 2011-06-26 DIAGNOSIS — I214 Non-ST elevation (NSTEMI) myocardial infarction: Secondary | ICD-10-CM | POA: Insufficient documentation

## 2011-06-26 DIAGNOSIS — Z5189 Encounter for other specified aftercare: Secondary | ICD-10-CM | POA: Insufficient documentation

## 2011-06-28 ENCOUNTER — Encounter (HOSPITAL_COMMUNITY): Payer: Self-pay

## 2011-07-01 ENCOUNTER — Encounter (HOSPITAL_COMMUNITY): Payer: Self-pay

## 2011-07-03 ENCOUNTER — Encounter (HOSPITAL_COMMUNITY): Payer: Self-pay

## 2011-07-05 ENCOUNTER — Encounter (HOSPITAL_COMMUNITY): Payer: Self-pay

## 2011-07-08 ENCOUNTER — Encounter (HOSPITAL_COMMUNITY): Payer: Self-pay

## 2011-07-10 ENCOUNTER — Encounter (HOSPITAL_COMMUNITY): Payer: Self-pay

## 2011-07-12 ENCOUNTER — Encounter (HOSPITAL_COMMUNITY): Payer: Self-pay

## 2011-07-15 ENCOUNTER — Encounter (HOSPITAL_COMMUNITY): Payer: Self-pay

## 2011-07-17 ENCOUNTER — Encounter: Payer: Self-pay | Admitting: Cardiovascular Disease

## 2011-07-17 ENCOUNTER — Encounter (HOSPITAL_COMMUNITY): Payer: Self-pay

## 2011-07-19 ENCOUNTER — Encounter (HOSPITAL_COMMUNITY): Payer: Self-pay

## 2011-07-22 ENCOUNTER — Encounter (HOSPITAL_COMMUNITY): Payer: Self-pay

## 2011-07-24 ENCOUNTER — Encounter (HOSPITAL_COMMUNITY): Payer: Self-pay

## 2011-07-26 ENCOUNTER — Encounter (HOSPITAL_COMMUNITY): Payer: Self-pay

## 2011-07-29 ENCOUNTER — Encounter (HOSPITAL_COMMUNITY): Payer: Self-pay | Attending: Cardiovascular Disease

## 2011-07-29 DIAGNOSIS — Z951 Presence of aortocoronary bypass graft: Secondary | ICD-10-CM | POA: Insufficient documentation

## 2011-07-29 DIAGNOSIS — K219 Gastro-esophageal reflux disease without esophagitis: Secondary | ICD-10-CM | POA: Insufficient documentation

## 2011-07-29 DIAGNOSIS — I712 Thoracic aortic aneurysm, without rupture, unspecified: Secondary | ICD-10-CM | POA: Insufficient documentation

## 2011-07-29 DIAGNOSIS — Z5189 Encounter for other specified aftercare: Secondary | ICD-10-CM | POA: Insufficient documentation

## 2011-07-29 DIAGNOSIS — E785 Hyperlipidemia, unspecified: Secondary | ICD-10-CM | POA: Insufficient documentation

## 2011-07-29 DIAGNOSIS — I214 Non-ST elevation (NSTEMI) myocardial infarction: Secondary | ICD-10-CM | POA: Insufficient documentation

## 2011-07-29 DIAGNOSIS — I2510 Atherosclerotic heart disease of native coronary artery without angina pectoris: Secondary | ICD-10-CM | POA: Insufficient documentation

## 2011-07-29 DIAGNOSIS — I10 Essential (primary) hypertension: Secondary | ICD-10-CM | POA: Insufficient documentation

## 2011-07-31 ENCOUNTER — Encounter (HOSPITAL_COMMUNITY): Payer: Self-pay

## 2011-08-02 ENCOUNTER — Encounter (HOSPITAL_COMMUNITY): Payer: Self-pay

## 2011-08-05 ENCOUNTER — Encounter (HOSPITAL_COMMUNITY): Payer: Self-pay

## 2011-08-07 ENCOUNTER — Encounter (HOSPITAL_COMMUNITY): Payer: Self-pay

## 2011-08-09 ENCOUNTER — Encounter (HOSPITAL_COMMUNITY): Payer: Self-pay

## 2011-08-12 ENCOUNTER — Encounter (HOSPITAL_COMMUNITY): Payer: Self-pay

## 2011-08-14 ENCOUNTER — Encounter (HOSPITAL_COMMUNITY): Payer: Self-pay

## 2011-08-16 ENCOUNTER — Encounter (HOSPITAL_COMMUNITY): Payer: Self-pay

## 2011-08-19 ENCOUNTER — Encounter (HOSPITAL_COMMUNITY): Payer: Self-pay

## 2011-08-21 ENCOUNTER — Encounter (HOSPITAL_COMMUNITY): Payer: Self-pay

## 2011-08-23 ENCOUNTER — Encounter (HOSPITAL_COMMUNITY): Payer: Self-pay

## 2011-08-23 ENCOUNTER — Ambulatory Visit (INDEPENDENT_AMBULATORY_CARE_PROVIDER_SITE_OTHER): Payer: Medicare Other | Admitting: Surgery

## 2011-08-23 ENCOUNTER — Encounter (INDEPENDENT_AMBULATORY_CARE_PROVIDER_SITE_OTHER): Payer: Self-pay | Admitting: Surgery

## 2011-08-23 VITALS — BP 125/74 | HR 77 | Temp 97.6°F | Resp 16 | Ht 70.0 in | Wt 197.8 lb

## 2011-08-23 DIAGNOSIS — K4090 Unilateral inguinal hernia, without obstruction or gangrene, not specified as recurrent: Secondary | ICD-10-CM | POA: Insufficient documentation

## 2011-08-23 NOTE — Progress Notes (Signed)
Chief Complaint:  Right scrotal hernia  History of Present Illness:  Samuel Coleman is an 66 y.o. male referred by Dr. Selena Batten with a enlarging right inguinal hernia. Samuel Coleman had diagnosed him as some years ago with a left inguinal hernia that has never really developed.  He has developed this right renal hernia over the last several months but it has gotten from the size of a golf ball to a scrotal hernia in the last several months. He denies any episodes of obstruction. I did explain to him the risks of observation and what to look for with nausea and vomiting in case he gets obstructed.  His wife is being treated for recurrent rhabdomyosarcoma of the leg with radiation therapy. He is needing to take care of her in width at. I told him that we could delay the surgery for some time and to call our office would be happy to eating schedule. However likely due to him as an outpatient and I explained the procedure to him and risk and gave him a booklet on running her hernia repair with mesh. On physical exam I couldn't feel really much in the way of a left inguinal hernia although he has fat in that region and a fullness.  Past Medical History  Diagnosis Date  . CAD (coronary artery disease)     s/p 3V CABG 01/01/10 (LIMA to LAD, SVG to OM, SVG to RCA- Dr.Bartle)  . Thoracic aortic aneurysm      stable on CT 01/13/10  . Hyperlipidemia   . Gout   . Asthma   . Blood transfusion   . GERD (gastroesophageal reflux disease)   . Hypertension   . Heart attack     Past Surgical History  Procedure Date  . Coronary artery bypass graft   . Tonsillectomy   . Excision of deep left neck mass. surgeon:  Samuel Coleman, m.d.     Current Outpatient Prescriptions  Medication Sig Dispense Refill  . allopurinol (ZYLOPRIM) 300 MG tablet Take 300 mg by mouth daily.      Marland Kitchen aspirin 81 MG tablet Take 81 mg by mouth daily.      Marland Kitchen dexlansoprazole (DEXILANT) 60 MG capsule Take 60 mg by mouth daily.         Marland Kitchen lisinopril (PRINIVIL,ZESTRIL) 20 MG tablet TAKE 1 TABLET BY MOUTH EVERY DAY  30 tablet  8  . metoprolol (LOPRESSOR) 50 MG tablet TAKE 1 TABLET BY MOUTH TWICE A DAY  60 tablet  10  . Multiple Vitamin (MULTIVITAMIN) capsule Take 1 capsule by mouth daily.        . simvastatin (ZOCOR) 20 MG tablet Take 40 mg by mouth at bedtime.       . vitamin C (ASCORBIC ACID) 500 MG tablet Take 500 mg by mouth daily.        . vitamin E 400 UNIT capsule Take 400 Units by mouth daily.         Review of patient's allergies indicates no known allergies. Family History  Problem Relation Age of Onset  . Heart failure Mother   . Pneumonia Father   . Cancer Sister     lung  . Cancer Maternal Grandmother     pancreatic  . Cancer Sister     esophagus   Social History:   reports that he quit smoking about 37 years ago. He does not have any smokeless tobacco history on file. He reports that he drinks about .6 ounces of  alcohol per week. He reports that he does not use illicit drugs.   REVIEW OF SYSTEMS - PERTINENT POSITIVES ONLY: As follows:He has some blurred vision for right cataract his right, as anxiety stress with his wife's illness.   Physical Exam:   Blood pressure 125/74, pulse 77, temperature 97.6 F (36.4 C), temperature source Temporal, resp. rate 16, height 5\' 10"  (1.778 m), weight 197 lb 12.8 oz (89.721 kg), SpO2 98.00%. Body mass index is 28.38 kg/(m^2).  Gen:  WDWN WM NAD  Neurological: Alert and oriented to person, place, and time. Motor and sensory function is grossly intact  Head: Normocephalic and atraumatic.  Eyes: Conjunctivae are normal. Pupils are equal, round, and reactive to light. No scleral icterus.  Neck: Normal range of motion. Neck supple. No tracheal deviation or thyromegaly present.  Cardiovascular:  SR without murmurs or gallops.  No carotid bruits Respiratory: Effort normal.  No respiratory distress. No chest wall tenderness. Breath sounds normal.  No wheezes, rales or  rhonchi.  Abdomen:  Right scrotal hernia.  No obvious left inguinal hernia.  Fatty pannus GU:  uncircumcized Musculoskeletal: Normal range of motion. Extremities are nontender. No cyanosis, edema or clubbing noted Lymphadenopathy: No cervical, preauricular, postauricular or axillary adenopathy is present Skin: Skin is warm and dry. No rash noted. No diaphoresis. No erythema. No pallor. Pscyh: Normal mood and affect. Behavior is normal. Judgment and thought content normal.   LABORATORY RESULTS: No results found for this or any previous visit (from the past 48 hour(s)).  RADIOLOGY RESULTS: No results found.  Problem List: Patient Active Problem List  Diagnosis  . HYPERLIPIDEMIA  . GOUT  . HYPERTENSION  . CAD, ARTERY BYPASS GRAFT  . THORACIC AORTIC ANEURYSM  . CAD (coronary artery disease)  . Carotid bruit  . Right inguinal hernia-scrotum    Assessment & Plan: Right inguinal hernia. The patient wants to care for his wife first and foremost and he does drive her to wait for his radiation therapy. I think that he can wait on this and call our office and we will schedule him for a open right inguinal hernia repair with mesh under general anesthesia.    Matt B. Daphine Deutscher, MD, [facility] [phone] beeper [phone]  08/23/2011 9:56 AM

## 2011-08-23 NOTE — Patient Instructions (Addendum)
Thanks for your patience.  If you need further assistance after leaving the office, please call our office and speak with French Ana A.  [phone].  If you want to leave a message for Dr. Daphine Deutscher, please call his office phone at [phone].  Watch for nausea and vomiting and a palpable mass in your right inguinal region--get to ER to get checked.

## 2011-08-26 ENCOUNTER — Encounter (HOSPITAL_COMMUNITY): Payer: Self-pay

## 2011-08-26 DIAGNOSIS — I10 Essential (primary) hypertension: Secondary | ICD-10-CM | POA: Insufficient documentation

## 2011-08-26 DIAGNOSIS — I214 Non-ST elevation (NSTEMI) myocardial infarction: Secondary | ICD-10-CM | POA: Insufficient documentation

## 2011-08-26 DIAGNOSIS — Z951 Presence of aortocoronary bypass graft: Secondary | ICD-10-CM | POA: Insufficient documentation

## 2011-08-26 DIAGNOSIS — I712 Thoracic aortic aneurysm, without rupture, unspecified: Secondary | ICD-10-CM | POA: Insufficient documentation

## 2011-08-26 DIAGNOSIS — I2510 Atherosclerotic heart disease of native coronary artery without angina pectoris: Secondary | ICD-10-CM | POA: Insufficient documentation

## 2011-08-26 DIAGNOSIS — K219 Gastro-esophageal reflux disease without esophagitis: Secondary | ICD-10-CM | POA: Insufficient documentation

## 2011-08-26 DIAGNOSIS — Z5189 Encounter for other specified aftercare: Secondary | ICD-10-CM | POA: Insufficient documentation

## 2011-08-26 DIAGNOSIS — E785 Hyperlipidemia, unspecified: Secondary | ICD-10-CM | POA: Insufficient documentation

## 2011-08-28 ENCOUNTER — Encounter (HOSPITAL_COMMUNITY): Payer: Self-pay

## 2011-08-28 ENCOUNTER — Ambulatory Visit (INDEPENDENT_AMBULATORY_CARE_PROVIDER_SITE_OTHER): Payer: Medicare Other | Admitting: Surgery

## 2011-08-30 ENCOUNTER — Encounter (HOSPITAL_COMMUNITY): Payer: Self-pay

## 2011-09-02 ENCOUNTER — Encounter (HOSPITAL_COMMUNITY): Payer: Self-pay

## 2011-09-03 ENCOUNTER — Encounter (INDEPENDENT_AMBULATORY_CARE_PROVIDER_SITE_OTHER): Payer: Self-pay

## 2011-09-04 ENCOUNTER — Encounter (HOSPITAL_COMMUNITY): Payer: Self-pay

## 2011-09-06 ENCOUNTER — Encounter (HOSPITAL_COMMUNITY)
Admission: RE | Admit: 2011-09-06 | Discharge: 2011-09-06 | Disposition: A | Discharge status: Home / Self Care | Payer: Self-pay | Source: Ambulatory Visit | Attending: Cardiovascular Disease | Admitting: Cardiovascular Disease

## 2011-09-09 ENCOUNTER — Encounter (HOSPITAL_COMMUNITY): Payer: Self-pay

## 2011-09-11 ENCOUNTER — Encounter (HOSPITAL_COMMUNITY)
Admission: RE | Admit: 2011-09-11 | Discharge: 2011-09-11 | Disposition: A | Discharge status: Home / Self Care | Payer: Self-pay | Source: Ambulatory Visit | Attending: Cardiovascular Disease | Admitting: Cardiovascular Disease

## 2011-09-13 ENCOUNTER — Encounter (HOSPITAL_COMMUNITY): Payer: Self-pay

## 2011-09-16 ENCOUNTER — Encounter (HOSPITAL_COMMUNITY)
Admission: RE | Admit: 2011-09-16 | Discharge: 2011-09-16 | Disposition: A | Discharge status: Home / Self Care | Payer: Self-pay | Source: Ambulatory Visit | Attending: Cardiovascular Disease | Admitting: Cardiovascular Disease

## 2011-09-18 ENCOUNTER — Encounter (HOSPITAL_COMMUNITY): Payer: Self-pay

## 2011-09-20 ENCOUNTER — Encounter (HOSPITAL_COMMUNITY): Payer: Self-pay

## 2011-09-23 ENCOUNTER — Encounter (HOSPITAL_COMMUNITY): Payer: Self-pay

## 2011-09-25 ENCOUNTER — Encounter (HOSPITAL_COMMUNITY): Payer: Self-pay

## 2011-09-27 ENCOUNTER — Encounter (HOSPITAL_COMMUNITY)
Admission: RE | Admit: 2011-09-27 | Discharge: 2011-09-27 | Disposition: A | Discharge status: Home / Self Care | Payer: Self-pay | Source: Ambulatory Visit | Attending: Cardiovascular Disease | Admitting: Cardiovascular Disease

## 2011-09-27 DIAGNOSIS — K219 Gastro-esophageal reflux disease without esophagitis: Secondary | ICD-10-CM | POA: Insufficient documentation

## 2011-09-27 DIAGNOSIS — E785 Hyperlipidemia, unspecified: Secondary | ICD-10-CM | POA: Insufficient documentation

## 2011-09-27 DIAGNOSIS — Z951 Presence of aortocoronary bypass graft: Secondary | ICD-10-CM | POA: Insufficient documentation

## 2011-09-27 DIAGNOSIS — I214 Non-ST elevation (NSTEMI) myocardial infarction: Secondary | ICD-10-CM | POA: Insufficient documentation

## 2011-09-27 DIAGNOSIS — Z5189 Encounter for other specified aftercare: Secondary | ICD-10-CM | POA: Insufficient documentation

## 2011-09-27 DIAGNOSIS — I712 Thoracic aortic aneurysm, without rupture, unspecified: Secondary | ICD-10-CM | POA: Insufficient documentation

## 2011-09-27 DIAGNOSIS — I2510 Atherosclerotic heart disease of native coronary artery without angina pectoris: Secondary | ICD-10-CM | POA: Insufficient documentation

## 2011-09-27 DIAGNOSIS — I10 Essential (primary) hypertension: Secondary | ICD-10-CM | POA: Insufficient documentation

## 2011-09-30 ENCOUNTER — Encounter (HOSPITAL_COMMUNITY): Payer: Self-pay

## 2011-10-02 ENCOUNTER — Encounter (HOSPITAL_COMMUNITY): Payer: Self-pay

## 2011-10-04 ENCOUNTER — Emergency Department (HOSPITAL_COMMUNITY): Payer: Medicare Other

## 2011-10-04 ENCOUNTER — Encounter (HOSPITAL_COMMUNITY): Payer: Self-pay

## 2011-10-04 ENCOUNTER — Inpatient Hospital Stay (HOSPITAL_COMMUNITY)
Admission: EM | Admit: 2011-10-04 | Discharge: 2011-10-07 | DRG: 395 | Disposition: A | Discharge status: Home / Self Care | Payer: Medicare Other | Attending: Internal Medicine | Admitting: Internal Medicine

## 2011-10-04 ENCOUNTER — Encounter (HOSPITAL_COMMUNITY): Payer: Self-pay | Admitting: Emergency Medicine

## 2011-10-04 ENCOUNTER — Telehealth: Payer: Self-pay | Admitting: Cardiovascular Disease

## 2011-10-04 DIAGNOSIS — Z951 Presence of aortocoronary bypass graft: Secondary | ICD-10-CM

## 2011-10-04 DIAGNOSIS — K4090 Unilateral inguinal hernia, without obstruction or gangrene, not specified as recurrent: Secondary | ICD-10-CM | POA: Diagnosis present

## 2011-10-04 DIAGNOSIS — D72829 Elevated white blood cell count, unspecified: Secondary | ICD-10-CM | POA: Diagnosis present

## 2011-10-04 DIAGNOSIS — K55059 Acute (reversible) ischemia of intestine, part and extent unspecified: Secondary | ICD-10-CM

## 2011-10-04 DIAGNOSIS — M109 Gout, unspecified: Secondary | ICD-10-CM | POA: Diagnosis present

## 2011-10-04 DIAGNOSIS — E785 Hyperlipidemia, unspecified: Secondary | ICD-10-CM

## 2011-10-04 DIAGNOSIS — K559 Vascular disorder of intestine, unspecified: Principal | ICD-10-CM

## 2011-10-04 DIAGNOSIS — R0989 Other specified symptoms and signs involving the circulatory and respiratory systems: Secondary | ICD-10-CM | POA: Diagnosis present

## 2011-10-04 DIAGNOSIS — Z7982 Long term (current) use of aspirin: Secondary | ICD-10-CM

## 2011-10-04 DIAGNOSIS — I2510 Atherosclerotic heart disease of native coronary artery without angina pectoris: Secondary | ICD-10-CM

## 2011-10-04 DIAGNOSIS — K219 Gastro-esophageal reflux disease without esophagitis: Secondary | ICD-10-CM | POA: Diagnosis present

## 2011-10-04 DIAGNOSIS — I712 Thoracic aortic aneurysm, without rupture, unspecified: Secondary | ICD-10-CM

## 2011-10-04 DIAGNOSIS — I252 Old myocardial infarction: Secondary | ICD-10-CM

## 2011-10-04 DIAGNOSIS — D696 Thrombocytopenia, unspecified: Secondary | ICD-10-CM | POA: Diagnosis present

## 2011-10-04 DIAGNOSIS — K5289 Other specified noninfective gastroenteritis and colitis: Secondary | ICD-10-CM

## 2011-10-04 DIAGNOSIS — Z79899 Other long term (current) drug therapy: Secondary | ICD-10-CM

## 2011-10-04 DIAGNOSIS — I10 Essential (primary) hypertension: Secondary | ICD-10-CM

## 2011-10-04 DIAGNOSIS — J45909 Unspecified asthma, uncomplicated: Secondary | ICD-10-CM | POA: Diagnosis present

## 2011-10-04 LAB — CBC WITH DIFFERENTIAL/PLATELET
Basophils Absolute: 0 10*3/uL (ref 0.0–0.1)
Basophils Relative: 0 % (ref 0–1)
Lymphocytes Relative: 13 % (ref 12–46)
MCH: 33.4 pg (ref 26.0–34.0)
MCHC: 35.8 g/dL (ref 30.0–36.0)
MCV: 93.2 fL (ref 78.0–100.0)
Monocytes Absolute: 1.4 10*3/uL — ABNORMAL HIGH (ref 0.1–1.0)
Platelets: 172 10*3/uL (ref 150–400)
RBC: 4.88 MIL/uL (ref 4.22–5.81)

## 2011-10-04 LAB — CARDIAC PANEL(CRET KIN+CKTOT+MB+TROPI)
CK, MB: 5.6 ng/mL — ABNORMAL HIGH (ref 0.3–4.0)
Relative Index: 3.1 — ABNORMAL HIGH (ref 0.0–2.5)
Total CK: 178 U/L (ref 7–232)
Troponin I: <0.3 ng/mL (ref <0.30)

## 2011-10-04 LAB — COMPREHENSIVE METABOLIC PANEL
ALT: 32 U/L (ref 0–53)
AST: 35 U/L (ref 0–37)
Albumin: 3.9 g/dL (ref 3.5–5.2)
Alkaline Phosphatase: 67 U/L (ref 39–117)
CO2: 25 mEq/L (ref 19–32)
Chloride: 103 mEq/L (ref 96–112)
Creatinine, Ser: 0.91 mg/dL (ref 0.50–1.35)
GFR calc non Af Amer: 85 mL/min — ABNORMAL LOW (ref >90)
Potassium: 4.4 mEq/L (ref 3.5–5.1)
Total Bilirubin: 0.4 mg/dL (ref 0.3–1.2)

## 2011-10-04 LAB — URINALYSIS, ROUTINE W REFLEX MICROSCOPIC
Bilirubin Urine: NEGATIVE
Glucose, UA: NEGATIVE mg/dL
Hgb urine dipstick: NEGATIVE
Ketones, ur: NEGATIVE mg/dL
Leukocytes, UA: NEGATIVE
Protein, ur: NEGATIVE mg/dL
pH: 5.5 (ref 5.0–8.0)

## 2011-10-04 LAB — LACTIC ACID, PLASMA: Lactic Acid, Venous: 0.9 mmol/L (ref 0.5–2.2)

## 2011-10-04 LAB — PROTIME-INR: INR: 1.12 (ref 0.00–1.49)

## 2011-10-04 MED ORDER — ONDANSETRON HCL 4 MG PO TABS: 4.0000 mg | ORAL_TABLET | Freq: Four times a day (QID) | ORAL | Status: DC | PRN

## 2011-10-04 MED ORDER — SODIUM CHLORIDE 0.9 % IV BOLUS (SEPSIS)
1000.0000 mL | Freq: Once | INTRAVENOUS | Status: AC
  Administered 2011-10-04: 1000 mL via INTRAVENOUS

## 2011-10-04 MED ORDER — SODIUM CHLORIDE 0.9 % IV SOLN
INTRAVENOUS | Status: AC
  Administered 2011-10-04: 19:00:00 via INTRAVENOUS

## 2011-10-04 MED ORDER — ONDANSETRON HCL 4 MG/2ML IJ SOLN: 4.0000 mg | Freq: Four times a day (QID) | INTRAMUSCULAR | Status: DC | PRN

## 2011-10-04 MED ORDER — IOHEXOL 300 MG/ML  SOLN
100.0000 mL | Freq: Once | INTRAMUSCULAR | Status: AC | PRN
  Administered 2011-10-04: 100 mL via INTRAVENOUS

## 2011-10-04 MED ORDER — METOPROLOL TARTRATE 1 MG/ML IV SOLN
2.5000 mg | Freq: Two times a day (BID) | INTRAVENOUS | Status: DC
  Administered 2011-10-04 – 2011-10-05 (×2): 2.5 mg via INTRAVENOUS
  Filled 2011-10-04 (×3): qty 5

## 2011-10-04 MED ORDER — IOHEXOL 300 MG/ML  SOLN
20.0000 mL | INTRAMUSCULAR | Status: AC
  Administered 2011-10-04 (×2): 20 mL via ORAL

## 2011-10-04 MED ORDER — SODIUM CHLORIDE 0.9 % IJ SOLN
3.0000 mL | Freq: Two times a day (BID) | INTRAMUSCULAR | Status: DC
  Administered 2011-10-05 – 2011-10-07 (×5): 3 mL via INTRAVENOUS

## 2011-10-04 MED ORDER — MORPHINE SULFATE 2 MG/ML IJ SOLN
0.5000 mg | INTRAMUSCULAR | Status: DC | PRN
  Administered 2011-10-05: 0.5 mg via INTRAVENOUS
  Filled 2011-10-04: qty 1

## 2011-10-04 NOTE — ED Notes (Signed)
Pt st's he is hungry, explained to him that we are waiting for admitting MD and will get him something then if MD approves.  Pt voices understanding.  Pt st's back pain has improved rates pain a #2 on pain scale 0-10

## 2011-10-04 NOTE — ED Notes (Signed)
Pt c/o generalized body aches and nausea x 1 week with mid back pain starting last night; pt sts same symptoms had in past with MI

## 2011-10-04 NOTE — ED Notes (Signed)
Surgeon to to assess pt at this time.  Wife at bedside.

## 2011-10-04 NOTE — Telephone Encounter (Signed)
New msg Pt has been nauseated for last few days. He has been having back pain. No chest pain. Please call

## 2011-10-04 NOTE — H&P (Signed)
[facility] History and Physical  Samuel Coleman UJW:[id number] DOB: 29-Dec-1945 DOA: 10/04/2011   PCP: Pearson Grippe, MD   Chief Complaint: back pain  HPI:  66 year old male came for worsening back pain since yesterday, feels the pain is similar to the pain he had before MI. He underwent a CT chest and abd with contrst and found to have ischemic colitis. He is admitted to medical service for observation and surgery consulted for further recommendations. No abd pain, slight nausea no vomiting, no sob, headache, palpitations. No blurry vision, no headache, no orthopnea, or PND. Loose bowel movements since 2 weeks.   Review of Systems:  See HPI otherwise negative.   Past Medical History  Diagnosis Date  . CAD (coronary artery disease)     s/p 3V CABG 01/01/10 (LIMA to LAD, SVG to OM, SVG to RCA- Dr.Bartle)  . Thoracic aortic aneurysm      stable on CT 01/13/10  . Hyperlipidemia   . Gout   . Asthma   . Blood transfusion   . GERD (gastroesophageal reflux disease)   . Hypertension   . Heart attack    Past Surgical History  Procedure Date  . Coronary artery bypass graft   . Tonsillectomy   . Excision of deep left neck mass. surgeon:  christopher e. newman, m.d.    Social History:  reports that he quit smoking about 37 years ago. He does not have any smokeless tobacco history on file. He reports that he drinks about .6 ounces of alcohol per week. He reports that he does not use illicit drugs.  No Known Allergies  Family History  Problem Relation Age of Onset  . Heart failure Mother   . Pneumonia Father   . Cancer Sister     lung  . Cancer Maternal Grandmother     pancreatic  . Cancer Sister     esophagus    Prior to Admission medications   Medication Sig Start Date End Date Taking? Authorizing Provider  allopurinol (ZYLOPRIM) 300 MG tablet Take 300 mg by mouth daily.   Yes Historical Provider, MD  aspirin 81 MG tablet Take 81 mg by mouth every evening.    Yes  Historical Provider, MD  Cholecalciferol (VITAMIN D PO) Take 1 tablet by mouth daily.   Yes Historical Provider, MD  dexlansoprazole (DEXILANT) 60 MG capsule Take 60 mg by mouth daily.    Yes Historical Provider, MD  diphenhydrAMINE (BENADRYL) 12.5 MG/5ML elixir Take 12.5 mg by mouth 4 (four) times daily as needed. As needed for allergies.   Yes Historical Provider, MD  lisinopril (PRINIVIL,ZESTRIL) 20 MG tablet Take 20 mg by mouth daily.   Yes Historical Provider, MD  metoprolol (LOPRESSOR) 50 MG tablet Take 50 mg by mouth 2 (two) times daily.   Yes Historical Provider, MD  Multiple Vitamin (MULTIVITAMIN) capsule Take 1 capsule by mouth daily.     Yes Historical Provider, MD  simvastatin (ZOCOR) 40 MG tablet Take 40 mg by mouth every evening.   Yes Historical Provider, MD  vitamin C (ASCORBIC ACID) 500 MG tablet Take 500 mg by mouth daily.     Yes Historical Provider, MD  vitamin E 400 UNIT capsule Take 400 Units by mouth daily.     Yes Historical Provider, MD   Physical Exam: Filed Vitals:   10/04/11 1445 10/04/11 1500 10/04/11 1837 10/04/11 2024  BP: 130/63 136/68 130/67 129/74  Pulse: 84 90 81 84  Temp:   98.5 F (36.9  C) 98.5 F (36.9 C)  TempSrc:   Oral Oral  Resp: 14 16 17 17   Height:   5\' 10"  (1.778 m) 5\' 10"  (1.778 m)  Weight:   87.4 kg (192 lb 10.9 oz) 87.4 kg (192 lb 10.9 oz)  SpO2: 98% 99% 95% 93%    Constitutional: Vital signs reviewed.  Patient is a well-developed and well-nourished  in no acute distress and cooperative with exam. Alert and oriented x3.  Head: Normocephalic and atraumatic  Mouth: no erythema or exudates, MMM Eyes: PERRL, EOMI, conjunctivae normal, No scleral icterus.  Neck: Supple, Trachea midline normal ROM, No JVD, mass, thyromegaly, or carotid bruit present.  Cardiovascular: RRR, S1 normal, S2 normal, no MRG, pulses symmetric and intact bilaterally Pulmonary/Chest: CTAB, no wheezes, rales, or rhonchi Abdominal: Soft. Non-tender, non-distended, bowel  sounds are normal, no masses, organomegaly, or guarding present.  GU: no CVA tenderness Musculoskeletal: No joint deformities, erythema, or stiffness, ROM full and no nontender Hematology: no cervical, inginal, or axillary adenopathy.  Neurological: A&O x3, Strength is normal and symmetric bilaterally, cranial nerve II-XII are grossly intact, no focal motor deficit, sensory intact to light touch bilaterally.  Skin: Warm, dry and intact. No rash, cyanosis, or clubbing.  Psychiatric: Normal mood and affect. speech and behavior is normal. Labs on Admission:  Basic Metabolic Panel:  Lab 10/04/11 4098  NA 137  K 4.4  CL 103  CO2 25  GLUCOSE 115*  BUN 19  CREATININE 0.91  CALCIUM 9.6  MG --  PHOS --   Liver Function Tests:  Lab 10/04/11 1015  AST 35  ALT 32  ALKPHOS 67  BILITOT 0.4  PROT 6.8  ALBUMIN 3.9    Lab 10/04/11 1015  LIPASE 29  AMYLASE --   No results found for this basename: AMMONIA:5 in the last 168 hours CBC:  Lab 10/04/11 1015  WBC 12.3*  NEUTROABS 9.1*  HGB 16.3  HCT 45.5  MCV 93.2  PLT 172   Cardiac Enzymes:  Lab 10/04/11 1843 10/04/11 1020  CKTOTAL 178 --  CKMB 5.6* --  CKMBINDEX -- --  TROPONINI <0.30 <0.30    BNP (last 3 results) No results found for this basename: PROBNP:3 in the last 8760 hours CBG: No results found for this basename: GLUCAP:5 in the last 168 hours  Radiological Exams on Admission: Ct Chest W Contrast  10/04/2011  *RADIOLOGY REPORT*  Clinical Data:  Back pain.  Body aches.  Nausea.  CT CHEST, ABDOMEN AND PELVIS WITH CONTRAST  Technique:  Multidetector CT imaging of the chest, abdomen and pelvis was performed following the standard protocol during bolus administration of intravenous contrast.  Contrast: OMNIPAQUE IOHEXOL 300 MG/ML  SOLN  Comparison:  02/15/2011.  CT CHEST  Findings:  Saccular distal aortic arch aneurysm remains present, without acute complicating features.  Dense mural calcification along the medial  aspects of the arch.  When measured in a similar fashion as on the prior examination, maximal diameter is 3.9 cm, the same as before.  The CABG.  No axillary adenopathy.  No pericardial or pleural effusion.  Three-vessel aortic arch.  No aggressive osseous lesions.  Old left-sided rib fractures.  Stable 4 mm right upper lobe pulmonary nodule (image 25 series 4). Documented stability back to 07/19/2009 indicating benign etiology. Linear scarring is present in the left lower lobe.  IMPRESSION: 1.  No acute abnormality of the chest. 2.  CABG. 3.  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 4.  Benign 4  mm right upper lobe pulmonary nodule.  CT ABDOMEN AND PELVIS  Findings:  Liver and gallbladder within normal limits.  Spleen normal.  Left upper polar and right lower polar renal cysts. Nonobstructing bilateral renal calculi.  Largest stone is in the left upper renal pole measuring 7 mm.  Other smaller calculi are present.  The ureters appear within normal limits.  No ureteral calculi.  Urinary bladder normal.  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 Adrenal glands are within normal limits.  Stomach, pancreas, common bile duct within normal limits.  No small bowel obstruction.  No adenopathy.  Normal appendix.  Mild inflammatory changes are present in the right lower quadrant. There is pneumatosis of the proximal ascending colon compatible ischemia with mild pericolonic inflammatory changes.  This is difficult to appreciate on the axial images and is best seen on the coronal images.  There is no portal venous gas.  No intrahepatic gas identified.  Colonic diverticulosis is present distally. Prostate and urinary bladder appear within normal limits.  No free fluid.  No aggressive osseous lesions.   Retroaortic left renal vein is present.  IMPRESSION: 1.  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  Mild inflammatory changes.  No  portal venous gas.  No perforation or free air.  The cause of the ischemia is unclear and the visualized superior mesenteric artery appears patent; embolic infarction/ischemia possible.  The venous system also appears patent.  Mild stranding in the right lower quadrant mesentery. 2.  Nonobstructing renal calculi.  Renal cysts.  Critical Value/emergent results were called by telephone at the time of interpretation on 10/04/2011 at 1342 hours to Dr. Rosalia Hammers, who verbally acknowledged these results.  Original Report Authenticated By: Andreas Newport, M.D.   Ct Abdomen Pelvis W Contrast  10/04/2011  *RADIOLOGY REPORT*  Clinical Data:  Back pain.  Body aches.  Nausea.  CT CHEST, ABDOMEN AND PELVIS WITH CONTRAST  Technique:  Multidetector CT imaging of the chest, abdomen and pelvis was performed following the standard protocol during bolus administration of intravenous contrast.  Contrast: OMNIPAQUE IOHEXOL 300 MG/ML  SOLN  Comparison:  02/15/2011.  CT CHEST  Findings:  Saccular distal aortic arch aneurysm remains present, without acute complicating features.  Dense mural calcification along the medial aspects of the arch.  When measured in a similar fashion as on the prior examination, maximal diameter is 3.9 cm, the same as before.  The CABG.  No axillary adenopathy.  No pericardial or pleural effusion.  Three-vessel aortic arch.  No aggressive osseous lesions.  Old left-sided rib fractures.  Stable 4 mm right upper lobe pulmonary nodule (image 25 series 4). Documented stability back to 07/19/2009 indicating benign etiology. Linear scarring is present in the left lower lobe.  IMPRESSION: 1.  No acute abnormality of the chest. 2.  CABG. 3.  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 4.  Benign 4 mm right upper lobe pulmonary nodule.  CT ABDOMEN AND PELVIS  Findings:  Liver and gallbladder within normal limits.  Spleen normal.  Left upper polar and right lower polar renal cysts. Nonobstructing  bilateral renal calculi.  Largest stone is in the left upper renal pole measuring 7 mm.  Other smaller calculi are present.  The ureters appear within normal limits.  No ureteral calculi.  Urinary bladder normal.  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 Adrenal glands are within normal limits.  Stomach, pancreas, common bile  duct within normal limits.  No small bowel obstruction.  No adenopathy.  Normal appendix.  Mild inflammatory changes are present in the right lower quadrant. There is pneumatosis of the proximal ascending colon compatible ischemia with mild pericolonic inflammatory changes.  This is difficult to appreciate on the axial images and is best seen on the coronal images.  There is no portal venous gas.  No intrahepatic gas identified.  Colonic diverticulosis is present distally. Prostate and urinary bladder appear within normal limits.  No free fluid.  No aggressive osseous lesions.   Retroaortic left renal vein is present.  IMPRESSION: 1.  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  Mild inflammatory changes.  No portal venous gas.  No perforation or free air.  The cause of the ischemia is unclear and the visualized superior mesenteric artery appears patent; embolic infarction/ischemia possible.  The venous system also appears patent.  Mild stranding in the right lower quadrant mesentery. 2.  Nonobstructing renal calculi.  Renal cysts.  Critical Value/emergent results were called by telephone at the time of interpretation on 10/04/2011 at 1342 hours to Dr. Rosalia Hammers, who verbally acknowledged these results.  Original Report Authenticated By: Andreas Newport, M.D.    EKG:NSR.  Assessment/Plan Active Problems:  HYPERLIPIDEMIA  GOUT  HYPERTENSION  CAD, ARTERY BYPASS GRAFT  THORACIC AORTIC ANEURYSM  CAD (coronary artery disease)  Carotid bruit  Right inguinal hernia-scrotum  Ischemic bowel disease   1. Ischemic  colitis: observation - fluids, pain control, NPO. - IF abd pain or back pain worsens ,w ill call surgery for surgical intervention - will call GI in am.  2. CAD: no chest pain, since he said the back pain is similar to the pain he had before the MI, will get cardiac enzymes.  Currently all his medications are on hold , will resume them in am.   Code Status: full code Family Communication: wife at bedside Disposition Plan: possibly home   Time spent: 45 minutes  Shatia Sindoni [facility] Pager [phone]  If 7PM-7AM, please contact night-coverage [email or web address] Password [email or web address] 10/04/2011, 8:52 PM

## 2011-10-04 NOTE — Telephone Encounter (Signed)
I spoke with the patient this morning. He states that for the last few days he has had nausea that feels like a possible stomach bug with some loss of appetite. He had an episode of diarrhea a few days ago and thought that his symptoms would resolve. However, last night he developed mid back pain. He states he has some cold sweats and felt clammy all the time over the past few days. He has a history of STEMI and CABG back in 12/2009. He states at that time, he did have one big cold sweat and then later that night he developed back pain. He does not have NTG at home. I have advised the patient that due to his history and the nature of his symptoms, he should proceed immediately to the ER. The patient voices understanding and is agreeable. I have notified Trish. I have notified Melissa, charge nurse in the ER, and made her aware that he is coming in.

## 2011-10-04 NOTE — ED Provider Notes (Addendum)
History     CSN: [id number]  Arrival date & time 10/04/11  5784   First MD Initiated Contact with Patient 10/04/11 [phone]      Chief Complaint  Patient presents with  . Back Pain  . Generalized Body Aches  . Nausea    (Consider location/radiation/quality/duration/timing/severity/associated sxs/prior treatment) HPI Patient with nausea for a week.  Pain began last night about 8 p.m.  Between shoulder blades like mi in 2011.  Some sweating.  Patient states pain now 2-3, pain off and on.  Last gone in middle of night.  No sob,vomiting,  Patient has diarrhea off and for "couple of weeks." PMD Dr. Selena Batten, cardi Allayne Gitelman  Past Medical History  Diagnosis Date  . CAD (coronary artery disease)     s/p 3V CABG 01/01/10 (LIMA to LAD, SVG to OM, SVG to RCA- Dr.Bartle)  . Thoracic aortic aneurysm      stable on CT 01/13/10  . Hyperlipidemia   . Gout   . Asthma   . Blood transfusion   . GERD (gastroesophageal reflux disease)   . Hypertension   . Heart attack     Past Surgical History  Procedure Date  . Coronary artery bypass graft   . Tonsillectomy   . Excision of deep left neck mass. surgeon:  christopher e. newman, m.d.     Family History  Problem Relation Age of Onset  . Heart failure Mother   . Pneumonia Father   . Cancer Sister     lung  . Cancer Maternal Grandmother     pancreatic  . Cancer Sister     esophagus    History  Substance Use Topics  . Smoking status: Former Smoker    Quit date: 02/25/1974  . Smokeless tobacco: Not on file  . Alcohol Use: 0.6 oz/week    1 Glasses of wine per week     weekly      Review of Systems  Constitutional: Positive for diaphoresis. Negative for fever, chills and unexpected weight change.  HENT: Positive for rhinorrhea and sneezing.   Eyes: Negative.   Respiratory: Negative.   Cardiovascular: Positive for palpitations.  Genitourinary: Positive for scrotal swelling.  Musculoskeletal: Positive for back pain.  Neurological:  Negative.   Psychiatric/Behavioral: Negative.     Allergies  Review of patient's allergies indicates no known allergies.  Home Medications   Current Outpatient Rx  Name Route Sig Dispense Refill  . ALLOPURINOL 300 MG PO TABS Oral Take 300 mg by mouth daily.    . ASPIRIN 81 MG PO TABS Oral Take 81 mg by mouth daily.    . DEXLANSOPRAZOLE 60 MG PO CPDR Oral Take 60 mg by mouth daily.      Marland Kitchen LISINOPRIL 20 MG PO TABS  TAKE 1 TABLET BY MOUTH EVERY DAY 30 tablet 8  . METOPROLOL TARTRATE 50 MG PO TABS  TAKE 1 TABLET BY MOUTH TWICE A DAY 60 tablet 10  . MULTIVITAMINS PO CAPS Oral Take 1 capsule by mouth daily.      Marland Kitchen SIMVASTATIN 20 MG PO TABS Oral Take 40 mg by mouth at bedtime.     Marland Kitchen VITAMIN C 500 MG PO TABS Oral Take 500 mg by mouth daily.      Marland Kitchen VITAMIN E 400 UNITS PO CAPS Oral Take 400 Units by mouth daily.        BP 150/85  Pulse 89  Temp 98.1 F (36.7 C) (Oral)  Resp 18  SpO2 100%  Physical Exam  Nursing note and vitals reviewed. Constitutional: He is oriented to person, place, and time. He appears well-developed and well-nourished.  Cardiovascular: Normal rate, regular rhythm and intact distal pulses.   Murmur heard.  Systolic murmur is present with a grade of 3/6       click  Abdominal: Soft. Normal appearance and bowel sounds are normal. There is no splenomegaly. There is tenderness in the epigastric area.  Musculoskeletal: Normal range of motion.  Neurological: He is alert and oriented to person, place, and time. He has normal reflexes.  Skin: Skin is warm and dry.  Psychiatric: He has a normal mood and affect. His behavior is normal. Thought content normal.    ED Course  Procedures (including critical care time)  Labs Reviewed - No data to display No results found.   No diagnosis found.  Results for orders placed during the hospital encounter of 10/04/11  CBC WITH DIFFERENTIAL      Component Value Range   WBC 12.3 (*) 4.0 - 10.5 K/uL   RBC 4.88  4.22 - 5.81  MIL/uL   Hemoglobin 16.3  13.0 - 17.0 g/dL   HCT 82.9  56.2 - 13.0 %   MCV 93.2  78.0 - 100.0 fL   MCH 33.4  26.0 - 34.0 pg   MCHC 35.8  30.0 - 36.0 g/dL   RDW 86.5  78.4 - 69.6 %   Platelets 172  150 - 400 K/uL   Neutrophils Relative 74  43 - 77 %   Neutro Abs 9.1 (*) 1.7 - 7.7 K/uL   Lymphocytes Relative 13  12 - 46 %   Lymphs Abs 1.6  0.7 - 4.0 K/uL   Monocytes Relative 12  3 - 12 %   Monocytes Absolute 1.4 (*) 0.1 - 1.0 K/uL   Eosinophils Relative 1  0 - 5 %   Eosinophils Absolute 0.1  0.0 - 0.7 K/uL   Basophils Relative 0  0 - 1 %   Basophils Absolute 0.0  0.0 - 0.1 K/uL  COMPREHENSIVE METABOLIC PANEL      Component Value Range   Sodium 137  135 - 145 mEq/L   Potassium 4.4  3.5 - 5.1 mEq/L   Chloride 103  96 - 112 mEq/L   CO2 25  19 - 32 mEq/L   Glucose, Bld 115 (*) 70 - 99 mg/dL   BUN 19  6 - 23 mg/dL   Creatinine, Ser 2.95  0.50 - 1.35 mg/dL   Calcium 9.6  8.4 - 28.4 mg/dL   Total Protein 6.8  6.0 - 8.3 g/dL   Albumin 3.9  3.5 - 5.2 g/dL   AST 35  0 - 37 U/L   ALT 32  0 - 53 U/L   Alkaline Phosphatase 67  39 - 117 U/L   Total Bilirubin 0.4  0.3 - 1.2 mg/dL   GFR calc non Af Amer 85 (*) >90 mL/min   GFR calc Af Amer >90  >90 mL/min  LIPASE, BLOOD      Component Value Range   Lipase 29  11 - 59 U/L  URINALYSIS, ROUTINE W REFLEX MICROSCOPIC      Component Value Range   Color, Urine YELLOW  YELLOW   APPearance CLEAR  CLEAR   Specific Gravity, Urine 1.024  1.005 - 1.030   pH 5.5  5.0 - 8.0   Glucose, UA NEGATIVE  NEGATIVE mg/dL   Hgb urine dipstick NEGATIVE  NEGATIVE   Bilirubin Urine NEGATIVE  NEGATIVE  Ketones, ur NEGATIVE  NEGATIVE mg/dL   Protein, ur NEGATIVE  NEGATIVE mg/dL   Urobilinogen, UA 0.2  0.0 - 1.0 mg/dL   Nitrite NEGATIVE  NEGATIVE   Leukocytes, UA NEGATIVE  NEGATIVE  TROPONIN I      Component Value Range   Troponin I <0.30  <0.30 ng/mL   Ct Chest W Contrast  10/04/2011  *RADIOLOGY REPORT*  Clinical Data:  Back pain.  Body aches.  Nausea.  CT  CHEST, ABDOMEN AND PELVIS WITH CONTRAST  Technique:  Multidetector CT imaging of the chest, abdomen and pelvis was performed following the standard protocol during bolus administration of intravenous contrast.  Contrast: OMNIPAQUE IOHEXOL 300 MG/ML  SOLN  Comparison:  02/15/2011.  CT CHEST  Findings:  Saccular distal aortic arch aneurysm remains present, without acute complicating features.  Dense mural calcification along the medial aspects of the arch.  When measured in a similar fashion as on the prior examination, maximal diameter is 3.9 cm, the same as before.  The CABG.  No axillary adenopathy.  No pericardial or pleural effusion.  Three-vessel aortic arch.  No aggressive osseous lesions.  Old left-sided rib fractures.  Stable 4 mm right upper lobe pulmonary nodule (image 25 series 4). Documented stability back to 07/19/2009 indicating benign etiology. Linear scarring is present in the left lower lobe.  IMPRESSION: 1.  No acute abnormality of the chest. 2.  CABG. 3.  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 4.  Benign 4 mm right upper lobe pulmonary nodule.  CT ABDOMEN AND PELVIS  Findings:  Liver and gallbladder within normal limits.  Spleen normal.  Left upper polar and right lower polar renal cysts. Nonobstructing bilateral renal calculi.  Largest stone is in the left upper renal pole measuring 7 mm.  Other smaller calculi are present.  The ureters appear within normal limits.  No ureteral calculi.  Urinary bladder normal.  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 Adrenal glands are within normal limits.  Stomach, pancreas, common bile duct within normal limits.  No small bowel obstruction.  No adenopathy.  Normal appendix.  Mild inflammatory changes are present in the right lower quadrant. There is pneumatosis of the proximal ascending colon compatible ischemia with mild pericolonic inflammatory changes.  This is difficult to  appreciate on the axial images and is best seen on the coronal images.  There is no portal venous gas.  No intrahepatic gas identified.  Colonic diverticulosis is present distally. Prostate and urinary bladder appear within normal limits.  No free fluid.  No aggressive osseous lesions.   Retroaortic left renal vein is present.  IMPRESSION: 1.  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  Mild inflammatory changes.  No portal venous gas.  No perforation or free air.  The cause of the ischemia is unclear and the visualized superior mesenteric artery appears patent; embolic infarction/ischemia possible.  The venous system also appears patent.  Mild stranding in the right lower quadrant mesentery. 2.  Nonobstructing renal calculi.  Renal cysts.  Critical Value/emergent results were called by telephone at the time of interpretation on 10/04/2011 at 1342 hours to Dr. Rosalia Hammers, who verbally acknowledged these results.  Original Report Authenticated By: Andreas Newport, M.D.   Ct Abdomen Pelvis W Contrast  10/04/2011  *RADIOLOGY REPORT*  Clinical Data:  Back pain.  Body aches.  Nausea.  CT CHEST, ABDOMEN AND PELVIS WITH CONTRAST  Technique:  Multidetector CT imaging of the chest,  abdomen and pelvis was performed following the standard protocol during bolus administration of intravenous contrast.  Contrast: OMNIPAQUE IOHEXOL 300 MG/ML  SOLN  Comparison:  02/15/2011.  CT CHEST  Findings:  Saccular distal aortic arch aneurysm remains present, without acute complicating features.  Dense mural calcification along the medial aspects of the arch.  When measured in a similar fashion as on the prior examination, maximal diameter is 3.9 cm, the same as before.  The CABG.  No axillary adenopathy.  No pericardial or pleural effusion.  Three-vessel aortic arch.  No aggressive osseous lesions.  Old left-sided rib fractures.  Stable 4 mm right upper lobe pulmonary nodule (image 25 series 4). Documented stability  back to 07/19/2009 indicating benign etiology. Linear scarring is present in the left lower lobe.  IMPRESSION: 1.  No acute abnormality of the chest. 2.  CABG. 3.  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 4.  Benign 4 mm right upper lobe pulmonary nodule.  CT ABDOMEN AND PELVIS  Findings:  Liver and gallbladder within normal limits.  Spleen normal.  Left upper polar and right lower polar renal cysts. Nonobstructing bilateral renal calculi.  Largest stone is in the left upper renal pole measuring 7 mm.  Other smaller calculi are present.  The ureters appear within normal limits.  No ureteral calculi.  Urinary bladder normal.  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 Adrenal glands are within normal limits.  Stomach, pancreas, common bile duct within normal limits.  No small bowel obstruction.  No adenopathy.  Normal appendix.  Mild inflammatory changes are present in the right lower quadrant. There is pneumatosis of the proximal ascending colon compatible ischemia with mild pericolonic inflammatory changes.  This is difficult to appreciate on the axial images and is best seen on the coronal images.  There is no portal venous gas.  No intrahepatic gas identified.  Colonic diverticulosis is present distally. Prostate and urinary bladder appear within normal limits.  No free fluid.  No aggressive osseous lesions.   Retroaortic left renal vein is present.  IMPRESSION: 1.  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  Mild inflammatory changes.  No portal venous gas.  No perforation or free air.  The cause of the ischemia is unclear and the visualized superior mesenteric artery appears patent; embolic infarction/ischemia possible.  The venous system also appears patent.  Mild stranding in the right lower quadrant mesentery. 2.  Nonobstructing renal calculi.  Renal cysts.  Critical Value/emergent results were called by  telephone at the time of interpretation on 10/04/2011 at 1342 hours to Dr. Rosalia Hammers, who verbally acknowledged these results.  Original Report Authenticated By: Andreas Newport, M.D.   Date: 11/24/2011  Rate: 86  Rhythm: normal sinus rhythm  QRS Axis: right  Intervals: normal  ST/T Wave abnormalities: normal  Conduction Disutrbances:none  Narrative Interpretation:   Old EKG Reviewed: none available    MDM    Samuel Coleman is a 66 year old man who comes in today with some upper to mid back pain consistent with a prior MI symptomatology. He has epigastric tenderness on his exam. CT of his abdomen shows pneumatosis of the ascending colon most it consistent with ischemic bowel. Patient has remained hemodynamically stable here in the emergency department. A general surgery consult is being obtained.      Hilario Quarry, MD 10/04/11 1354  Hilario Quarry, MD 11/24/11 [phone]

## 2011-10-04 NOTE — Progress Notes (Signed)
Disposition Note  Samuel Coleman, is a 66 y.o. male,   MRN: [id number]  -  DOB - 21-Jul-1945  Outpatient Primary MD for the patient is Pearson Grippe, MD   Blood pressure 136/68, pulse 90, temperature 98.1 F (36.7 C), temperature source Oral, resp. rate 16, SpO2 99.00%.  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  HYPERLIPIDEMIA  GOUT  HYPERTENSION  CAD, ARTERY BYPASS GRAFT  THORACIC AORTIC ANEURYSM - 39 mm unchanged.  CAD (coronary artery disease)  Carotid bruit  Right inguinal hernia-scrotum     66 yo male with history above, reports pain between his shoulder blades last night - it felt like his previous MI.  Over the last few days he had been having diarrhea/nausea and anorexia.  Patient is stable.  Labs are good (TI is <0.30).  WBC elevated at 12.3.    CT Abd/Pelvis shows pneumatosis of the ascending colon compatible with ischemic colitis.  [facility] has seen the patient and will follow, but will ask that we act as primary.  I will request inpatient admission, telemetry bed.   Algis Downs, PA-C [facility] Pager: [phone]

## 2011-10-04 NOTE — Consult Note (Signed)
Reason for Consult Abdominal pain and nausea Referring Physician: Evander Macaraeg is an 66 y.o. male.  HPI: Patient with nausea for a week. Pain began last night about 8 p.m. Between shoulder blades like mi in 2011. Some sweating. Patient states pain now 2-3, pain off and on. Last episode was in middle of night. No sob,vomiting, Patient has diarrhea off and for "couple of weeks."   His symptoms of "hot flashes", back pain, and cold sweats mimicked his heart attack pain of 2 years ago.  He complains of pain in the center of his back. No recent new meds or sick contacts. Denies hematachezia, pain in RLQ and also in the periumbilical region.   He presented to [facility] today underwent CT for eval, results:  1. Pneumatosis of the ascending colon compatible with ischemic  colitis. Mild inflammatory changes. No portal venous gas. No  perforation or free air. The cause of the ischemia is unclear and  the visualized superior mesenteric artery appears patent; embolic  infarction/ischemia possible. The venous system also appears  patent. Mild stranding in the right lower quadrant mesentery.   2. Nonobstructing renal calculi. Renal cysts. We are asked to see him on consult for evaluation and recommendations.  Past Medical History  Diagnosis Date  . CAD (coronary artery disease)     s/p 3V CABG 01/01/10 (LIMA to LAD, SVG to OM, SVG to RCA- Dr.Bartle)  . Thoracic aortic aneurysm      stable on CT 01/13/10  . Hyperlipidemia   . Gout   . Asthma   . Blood transfusion   . GERD (gastroesophageal reflux disease)   . Hypertension   . Heart attack     Past Surgical History  Procedure Date  . Coronary artery bypass graft   . Tonsillectomy   . Excision of deep left neck mass. surgeon:  christopher e. Yolandra Habig, m.d.     Family History  Problem Relation Age of Onset  . Heart failure Mother   . Pneumonia Father   . Cancer Sister     lung  . Cancer Maternal Grandmother     pancreatic  .  Cancer Sister     esophagus    Social History:  reports that he quit smoking about 37 years ago. He does not have any smokeless tobacco history on file. He reports that he drinks about .6 ounces of alcohol per week. He reports that he does not use illicit drugs.  Allergies: No Known Allergies  Medications: I have reviewed the patient's current medications.  Results for orders placed during the hospital encounter of 10/04/11 (from the past 48 hour(s))  CBC WITH DIFFERENTIAL     Status: Abnormal   Collection Time   10/04/11 10:15 AM      Component Value Range Comment   WBC 12.3 (*) 4.0 - 10.5 K/uL    RBC 4.88  4.22 - 5.81 MIL/uL    Hemoglobin 16.3  13.0 - 17.0 g/dL    HCT 65.7  84.6 - 96.2 %    MCV 93.2  78.0 - 100.0 fL    MCH 33.4  26.0 - 34.0 pg    MCHC 35.8  30.0 - 36.0 g/dL    RDW 95.2  84.1 - 32.4 %    Platelets 172  150 - 400 K/uL    Neutrophils Relative 74  43 - 77 %    Neutro Abs 9.1 (*) 1.7 - 7.7 K/uL    Lymphocytes Relative 13  12 -  46 %    Lymphs Abs 1.6  0.7 - 4.0 K/uL    Monocytes Relative 12  3 - 12 %    Monocytes Absolute 1.4 (*) 0.1 - 1.0 K/uL    Eosinophils Relative 1  0 - 5 %    Eosinophils Absolute 0.1  0.0 - 0.7 K/uL    Basophils Relative 0  0 - 1 %    Basophils Absolute 0.0  0.0 - 0.1 K/uL   COMPREHENSIVE METABOLIC PANEL     Status: Abnormal   Collection Time   10/04/11 10:15 AM      Component Value Range Comment   Sodium 137  135 - 145 mEq/L    Potassium 4.4  3.5 - 5.1 mEq/L    Chloride 103  96 - 112 mEq/L    CO2 25  19 - 32 mEq/L    Glucose, Bld 115 (*) 70 - 99 mg/dL    BUN 19  6 - 23 mg/dL    Creatinine, Ser 1.61  0.50 - 1.35 mg/dL    Calcium 9.6  8.4 - 09.6 mg/dL    Total Protein 6.8  6.0 - 8.3 g/dL    Albumin 3.9  3.5 - 5.2 g/dL    AST 35  0 - 37 U/L    ALT 32  0 - 53 U/L    Alkaline Phosphatase 67  39 - 117 U/L    Total Bilirubin 0.4  0.3 - 1.2 mg/dL    GFR calc non Af Amer 85 (*) >90 mL/min    GFR calc Af Amer >90  >90 mL/min   LIPASE, BLOOD      Status: Normal   Collection Time   10/04/11 10:15 AM      Component Value Range Comment   Lipase 29  11 - 59 U/L   TROPONIN I     Status: Normal   Collection Time   10/04/11 10:20 AM      Component Value Range Comment   Troponin I <0.30  <0.30 ng/mL   URINALYSIS, ROUTINE W REFLEX MICROSCOPIC     Status: Normal   Collection Time   10/04/11 11:25 AM      Component Value Range Comment   Color, Urine YELLOW  YELLOW    APPearance CLEAR  CLEAR    Specific Gravity, Urine 1.024  1.005 - 1.030    pH 5.5  5.0 - 8.0    Glucose, UA NEGATIVE  NEGATIVE mg/dL    Hgb urine dipstick NEGATIVE  NEGATIVE    Bilirubin Urine NEGATIVE  NEGATIVE    Ketones, ur NEGATIVE  NEGATIVE mg/dL    Protein, ur NEGATIVE  NEGATIVE mg/dL    Urobilinogen, UA 0.2  0.0 - 1.0 mg/dL    Nitrite NEGATIVE  NEGATIVE    Leukocytes, UA NEGATIVE  NEGATIVE MICROSCOPIC NOT DONE ON URINES WITH NEGATIVE PROTEIN, BLOOD, LEUKOCYTES, NITRITE, OR GLUCOSE <1000 mg/dL.  PROTIME-INR     Status: Normal   Collection Time   10/04/11  2:04 PM      Component Value Range Comment   Prothrombin Time 14.6  11.6 - 15.2 seconds    INR 1.12  0.00 - 1.49   LACTIC ACID, PLASMA     Status: Normal   Collection Time   10/04/11  2:04 PM      Component Value Range Comment   Lactic Acid, Venous 0.9  0.5 - 2.2 mmol/L     Ct Chest W Contrast  10/04/2011  *RADIOLOGY REPORT*  Clinical Data:  Back pain.  Body aches.  Nausea.  CT CHEST, ABDOMEN AND PELVIS WITH CONTRAST  Technique:  Multidetector CT imaging of the chest, abdomen and pelvis was performed following the standard protocol during bolus administration of intravenous contrast.  Contrast: OMNIPAQUE IOHEXOL 300 MG/ML  SOLN  Comparison:  02/15/2011.  CT CHEST  Findings:  Saccular distal aortic arch aneurysm remains present, without acute complicating features.  Dense mural calcification along the medial aspects of the arch.  When measured in a similar fashion as on the prior examination, maximal diameter is  3.9 cm, the same as before.  The CABG.  No axillary adenopathy.  No pericardial or pleural effusion.  Three-vessel aortic arch.  No aggressive osseous lesions.  Old left-sided rib fractures.  Stable 4 mm right upper lobe pulmonary nodule (image 25 series 4). Documented stability back to 07/19/2009 indicating benign etiology. Linear scarring is present in the left lower lobe.  IMPRESSION: 1.  No acute abnormality of the chest. 2.  CABG. 3.  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 4.  Benign 4 mm right upper lobe pulmonary nodule.  CT ABDOMEN AND PELVIS  Findings:  Liver and gallbladder within normal limits.  Spleen normal.  Left upper polar and right lower polar renal cysts. Nonobstructing bilateral renal calculi.  Largest stone is in the left upper renal pole measuring 7 mm.  Other smaller calculi are present.  The ureters appear within normal limits.  No ureteral calculi.  Urinary bladder normal.  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 Adrenal glands are within normal limits.  Stomach, pancreas, common bile duct within normal limits.  No small bowel obstruction.  No adenopathy.  Normal appendix.  Mild inflammatory changes are present in the right lower quadrant. There is pneumatosis of the proximal ascending colon compatible ischemia with mild pericolonic inflammatory changes.  This is difficult to appreciate on the axial images and is best seen on the coronal images.  There is no portal venous gas.  No intrahepatic gas identified.  Colonic diverticulosis is present distally. Prostate and urinary bladder appear within normal limits.  No free fluid.  No aggressive osseous lesions.   Retroaortic left renal vein is present.  IMPRESSION: 1.  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  Mild inflammatory changes.  No portal venous gas.  No perforation or free air.  The cause of the ischemia is unclear and the visualized  superior mesenteric artery appears patent; embolic infarction/ischemia possible.  The venous system also appears patent.  Mild stranding in the right lower quadrant mesentery. 2.  Nonobstructing renal calculi.  Renal cysts.  Critical Value/emergent results were called by telephone at the time of interpretation on 10/04/2011 at 1342 hours to Dr. Rosalia Hammers, who verbally acknowledged these results.  Original Report Authenticated By: Andreas Newport, M.D.   Ct Abdomen Pelvis W Contrast  10/04/2011  *RADIOLOGY REPORT*  Clinical Data:  Back pain.  Body aches.  Nausea.  CT CHEST, ABDOMEN AND PELVIS WITH CONTRAST  Technique:  Multidetector CT imaging of the chest, abdomen and pelvis was performed following the standard protocol during bolus administration of intravenous contrast.  Contrast: OMNIPAQUE IOHEXOL 300 MG/ML  SOLN  Comparison:  02/15/2011.  CT CHEST  Findings:  Saccular distal aortic arch aneurysm remains present, without acute complicating features.  Dense mural calcification along the medial aspects of the arch.  When measured in a similar fashion as on the prior examination,  maximal diameter is 3.9 cm, the same as before.  The CABG.  No axillary adenopathy.  No pericardial or pleural effusion.  Three-vessel aortic arch.  No aggressive osseous lesions.  Old left-sided rib fractures.  Stable 4 mm right upper lobe pulmonary nodule (image 25 series 4). Documented stability back to 07/19/2009 indicating benign etiology. Linear scarring is present in the left lower lobe.  IMPRESSION: 1.  No acute abnormality of the chest. 2.  CABG. 3.  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 4.  Benign 4 mm right upper lobe pulmonary nodule.  CT ABDOMEN AND PELVIS  Findings:  Liver and gallbladder within normal limits.  Spleen normal.  Left upper polar and right lower polar renal cysts. Nonobstructing bilateral renal calculi.  Largest stone is in the left upper renal pole measuring 7 mm.  Other smaller calculi  are present.  The ureters appear within normal limits.  No ureteral calculi.  Urinary bladder normal.  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 Adrenal glands are within normal limits.  Stomach, pancreas, common bile duct within normal limits.  No small bowel obstruction.  No adenopathy.  Normal appendix.  Mild inflammatory changes are present in the right lower quadrant. There is pneumatosis of the proximal ascending colon compatible ischemia with mild pericolonic inflammatory changes.  This is difficult to appreciate on the axial images and is best seen on the coronal images.  There is no portal venous gas.  No intrahepatic gas identified.  Colonic diverticulosis is present distally. Prostate and urinary bladder appear within normal limits.  No free fluid.  No aggressive osseous lesions.   Retroaortic left renal vein is present.  IMPRESSION: 1.  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  Mild inflammatory changes.  No portal venous gas.  No perforation or free air.  The cause of the ischemia is unclear and the visualized superior mesenteric artery appears patent; embolic infarction/ischemia possible.  The venous system also appears patent.  Mild stranding in the right lower quadrant mesentery. 2.  Nonobstructing renal calculi.  Renal cysts.  Critical Value/emergent results were called by telephone at the time of interpretation on 10/04/2011 at 1342 hours to Dr. Rosalia Hammers, who verbally acknowledged these results.  Original Report Authenticated By: Andreas Newport, M.D.    Review of Systems  Constitutional: Positive for chills and diaphoresis. Negative for fever, weight loss and malaise/fatigue.  HENT: Negative.   Eyes: Negative.   Respiratory: Negative.   Cardiovascular: Positive for palpitations. Negative for chest pain, orthopnea, claudication, leg swelling and PND.  Gastrointestinal: Positive for nausea, abdominal pain and diarrhea.  Negative for heartburn, vomiting, constipation, blood in stool and melena.  Genitourinary: Negative.   Musculoskeletal: Negative.   Skin: Negative.   Neurological: Negative.  Negative for weakness.  Endo/Heme/Allergies: Negative.   Psychiatric/Behavioral: Negative.    Blood pressure 136/68, pulse 90, temperature 98.1 F (36.7 C), temperature source Oral, resp. rate 16, SpO2 99.00%. Physical Exam  Constitutional: He is oriented to person, place, and time. He appears well-developed and well-nourished. No distress.  HENT:  Head: Normocephalic and atraumatic.  Eyes: EOM are normal. Pupils are equal, round, and reactive to light. Right eye exhibits no discharge. Left eye exhibits no discharge. No scleral icterus.  Neck: Normal range of motion. Neck supple. No JVD present. No tracheal deviation present. No thyromegaly present.  Cardiovascular: Normal rate, regular rhythm and normal heart sounds.  Exam reveals no gallop and no friction rub.  No murmur heard. Respiratory: Effort normal and breath sounds normal. No stridor. No respiratory distress. He has no wheezes. He has no rales. He exhibits no tenderness.  GI: Soft. Bowel sounds are normal. He exhibits no distension and no mass. There is tenderness. There is no rebound and no guarding.  Musculoskeletal: Normal range of motion. He exhibits no edema and no tenderness.  Lymphadenopathy:    He has no cervical adenopathy.  Neurological: He is alert and oriented to person, place, and time. No cranial nerve deficit.  Skin: Skin is warm. No rash noted. He is not diaphoretic. No erythema. No pallor.  Psychiatric: He has a normal mood and affect.    Assessment/Plan: 1.  Colitis of right colon, likely ischemic in origin. 2.  Nephrolithiasis.  Patient Active Problem List  Diagnosis  . HYPERLIPIDEMIA  . GOUT  . HYPERTENSION  . CAD, ARTERY BYPASS GRAFT  . THORACIC AORTIC ANEURYSM  . CAD (coronary artery disease)  . Carotid bruit  . Right  inguinal hernia-scrotum   Plan:  Recommend NPO IVF Conservative management  May need GI evaluation in future. Admission and management by medicine team If patient's pain increases or he worsen's clinically, he may need surgical intervention. We will continue to follow  Blenda Mounts 10/04/2011, 3:12 PM   PCP - Dr. Pearson Grippe Cardiologist - Dr. Alyson Ingles GI - Dr. Chip Boer.  He had a colonoscopy in 2011 which was negative. Saw Dr. Sheron Nightingale about one month ago for his right inguinal hernia.  He was trying to decide when to have surgery. He is having almost no abdominal symptoms except some loose stools.  WBC - 12,300. Agree with medical management.  Will follow.  Ovidio Kin, MD, [facility] Surgery Pager: [phone] Office phone:  [phone]

## 2011-10-05 DIAGNOSIS — K5289 Other specified noninfective gastroenteritis and colitis: Secondary | ICD-10-CM

## 2011-10-05 DIAGNOSIS — K559 Vascular disorder of intestine, unspecified: Principal | ICD-10-CM

## 2011-10-05 DIAGNOSIS — I712 Thoracic aortic aneurysm, without rupture: Secondary | ICD-10-CM

## 2011-10-05 LAB — CBC
Hemoglobin: 15.5 g/dL (ref 13.0–17.0)
MCH: 32.7 pg (ref 26.0–34.0)
Platelets: 148 10*3/uL — ABNORMAL LOW (ref 150–400)
RBC: 4.74 MIL/uL (ref 4.22–5.81)
WBC: 10.5 10*3/uL (ref 4.0–10.5)

## 2011-10-05 LAB — COMPREHENSIVE METABOLIC PANEL
AST: 33 U/L (ref 0–37)
BUN: 14 mg/dL (ref 6–23)
CO2: 24 mEq/L (ref 19–32)
Calcium: 9.1 mg/dL (ref 8.4–10.5)
Creatinine, Ser: 0.87 mg/dL (ref 0.50–1.35)
GFR calc Af Amer: >90 mL/min (ref >90)
GFR calc non Af Amer: 87 mL/min — ABNORMAL LOW (ref >90)

## 2011-10-05 LAB — CARDIAC PANEL(CRET KIN+CKTOT+MB+TROPI)
CK, MB: 3.5 ng/mL (ref 0.3–4.0)
Relative Index: 2.6 — ABNORMAL HIGH (ref 0.0–2.5)
Troponin I: <0.3 ng/mL (ref <0.30)

## 2011-10-05 LAB — TSH: TSH: 0.81 u[IU]/mL (ref 0.350–4.500)

## 2011-10-05 MED ORDER — METOPROLOL TARTRATE 50 MG PO TABS
50.0000 mg | ORAL_TABLET | Freq: Two times a day (BID) | ORAL | Status: DC
  Filled 2011-10-05 (×2): qty 1

## 2011-10-05 MED ORDER — LISINOPRIL 20 MG PO TABS
20.0000 mg | ORAL_TABLET | Freq: Every day | ORAL | Status: DC
  Administered 2011-10-05 – 2011-10-07 (×3): 20 mg via ORAL
  Filled 2011-10-05 (×3): qty 1

## 2011-10-05 MED ORDER — LORAZEPAM 2 MG/ML IJ SOLN
1.0000 mg | Freq: Once | INTRAMUSCULAR | Status: AC
  Administered 2011-10-05: 1 mg via INTRAVENOUS
  Filled 2011-10-05: qty 1

## 2011-10-05 MED ORDER — CIPROFLOXACIN IN D5W 400 MG/200ML IV SOLN
400.0000 mg | Freq: Two times a day (BID) | INTRAVENOUS | Status: DC
  Administered 2011-10-05 – 2011-10-07 (×4): 400 mg via INTRAVENOUS
  Filled 2011-10-05 (×5): qty 200

## 2011-10-05 MED ORDER — PANTOPRAZOLE SODIUM 40 MG PO TBEC
40.0000 mg | DELAYED_RELEASE_TABLET | Freq: Every day | ORAL | Status: DC
  Administered 2011-10-05 – 2011-10-06 (×2): 40 mg via ORAL
  Filled 2011-10-05 (×2): qty 1

## 2011-10-05 MED ORDER — METRONIDAZOLE IN NACL 5-0.79 MG/ML-% IV SOLN
500.0000 mg | Freq: Four times a day (QID) | INTRAVENOUS | Status: DC
  Administered 2011-10-05 – 2011-10-07 (×8): 500 mg via INTRAVENOUS
  Filled 2011-10-05 (×11): qty 100

## 2011-10-05 MED ORDER — LORAZEPAM 1 MG PO TABS
1.0000 mg | ORAL_TABLET | Freq: Every evening | ORAL | Status: DC | PRN
Start: 2011-10-05 — End: 2011-10-07
  Administered 2011-10-05: 1 mg via ORAL
  Filled 2011-10-05 (×2): qty 1

## 2011-10-05 NOTE — Progress Notes (Signed)
[facility] PROGRESS NOTE  Samuel Coleman ZOX:[id number] DOB: 1945/06/28 DOA: 10/04/2011 PCP: Pearson Grippe, MD  Assessment/Plan: Principal Problem:  *Ischemic bowel disease Active Problems:  HYPERLIPIDEMIA  GOUT  HYPERTENSION  CAD, ARTERY BYPASS GRAFT  THORACIC AORTIC ANEURYSM  CAD (coronary artery disease)  Carotid bruit  Right inguinal hernia-scrotum  1. Ischemic colitis: abdominal pain is resolved.  Appreciated surgery input. Plan to start the patient on clear liquid diet. GI consult called , Dr Juanda Chance is covering for Dr Elnoria Howard today. Continue to monitor.   2. CAD, CABG, : Will resume his home medications including, lisinopril, metoprolol.  3. Stable Descending aorta aneurysm  4. Gout: no flare up.   5. Leukocytosis: resolved. No source of infection.   6. Mild thrombocytopenia: not on lovenox . Continue to monitor.   Code Status: FULL CODE Family Communication: discussed with wife at bedside. Disposition Plan: possibly home.    Brief narrative: 66 year old gentleman came for worsening back pain since yesterday, feels the pain is similar to the pain he had before MI. He underwent a CT chest and abd with contrst and found to have ischemic colitis. He is admitted to medical service for observation and surgery consulted for further recommendations. No abd pain, slight nausea no vomiting, no sob, headache, palpitations. No blurry vision, no headache, no orthopnea, or PND. Loose bowel movements since 2 weeks   Consultants:  Surgery   GI  Procedures:  CT CHEST AND ABDOMEN AND PELVIS  Antibiotics:  NONE  HPI/Subjective: Much better, pain is better controlled.   Objective: Filed Vitals:   10/04/11 1837 10/04/11 2024 10/05/11 0523 10/05/11 0917  BP: 130/67 129/74 141/89 124/73  Pulse: 81 84 97 97  Temp: 98.5 F (36.9 C) 98.5 F (36.9 C) 98.1 F (36.7 C) 98.4 F (36.9 C)  TempSrc: Oral Oral Oral Oral  Resp: 17 17 17 18   Height: 5\' 10"  (1.778 m) 5\' 10"   (1.778 m)    Weight: 87.4 kg (192 lb 10.9 oz) 87.4 kg (192 lb 10.9 oz)    SpO2: 95% 93% 93% 97%    Intake/Output Summary (Last 24 hours) at 10/05/11 1012 Last data filed at 10/05/11 0900  Gross per 24 hour  Intake      0 ml  Output      0 ml  Net      0 ml    Exam:   General:  Alert afebrile comfortable  Cardiovascular: s1s2  Respiratory: CTAB  Abdomen: soft NT ND bs+  Data Reviewed: Basic Metabolic Panel:  Lab 10/05/11 8119 10/04/11 1015  NA 136 137  K 3.9 4.4  CL 102 103  CO2 24 25  GLUCOSE 83 115*  BUN 14 19  CREATININE 0.87 0.91  CALCIUM 9.1 9.6  MG -- --  PHOS -- --   Liver Function Tests:  Lab 10/05/11 0235 10/04/11 1015  AST 33 35  ALT 26 32  ALKPHOS 61 67  BILITOT 0.6 0.4  PROT 6.4 6.8  ALBUMIN 3.6 3.9    Lab 10/04/11 1015  LIPASE 29  AMYLASE --   No results found for this basename: AMMONIA:5 in the last 168 hours CBC:  Lab 10/05/11 0235 10/04/11 1015  WBC 10.5 12.3*  NEUTROABS -- 9.1*  HGB 15.5 16.3  HCT 44.7 45.5  MCV 94.3 93.2  PLT 148* 172   Cardiac Enzymes:  Lab 10/05/11 0235 10/04/11 1843 10/04/11 1020  CKTOTAL 127 178 --  CKMB 4.4* 5.6* --  CKMBINDEX -- -- --  TROPONINI <0.30 <0.30 <0.30   BNP (last 3 results) No results found for this basename: PROBNP:3 in the last 8760 hours CBG: No results found for this basename: GLUCAP:5 in the last 168 hours  No results found for this or any previous visit (from the past 240 hour(s)).   Studies: Ct Chest W Contrast  10/04/2011  *RADIOLOGY REPORT*  Clinical Data:  Back pain.  Body aches.  Nausea.  CT CHEST, ABDOMEN AND PELVIS WITH CONTRAST  Technique:  Multidetector CT imaging of the chest, abdomen and pelvis was performed following the standard protocol during bolus administration of intravenous contrast.  Contrast: OMNIPAQUE IOHEXOL 300 MG/ML  SOLN  Comparison:  02/15/2011.  CT CHEST  Findings:  Saccular distal aortic arch aneurysm remains present, without acute complicating  features.  Dense mural calcification along the medial aspects of the arch.  When measured in a similar fashion as on the prior examination, maximal diameter is 3.9 cm, the same as before.  The CABG.  No axillary adenopathy.  No pericardial or pleural effusion.  Three-vessel aortic arch.  No aggressive osseous lesions.  Old left-sided rib fractures.  Stable 4 mm right upper lobe pulmonary nodule (image 25 series 4). Documented stability back to 07/19/2009 indicating benign etiology. Linear scarring is present in the left lower lobe.  IMPRESSION: 1.  No acute abnormality of the chest. 2.  CABG. 3.  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 4.  Benign 4 mm right upper lobe pulmonary nodule.  CT ABDOMEN AND PELVIS  Findings:  Liver and gallbladder within normal limits.  Spleen normal.  Left upper polar and right lower polar renal cysts. Nonobstructing bilateral renal calculi.  Largest stone is in the left upper renal pole measuring 7 mm.  Other smaller calculi are present.  The ureters appear within normal limits.  No ureteral calculi.  Urinary bladder normal.  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 Adrenal glands are within normal limits.  Stomach, pancreas, common bile duct within normal limits.  No small bowel obstruction.  No adenopathy.  Normal appendix.  Mild inflammatory changes are present in the right lower quadrant. There is pneumatosis of the proximal ascending colon compatible ischemia with mild pericolonic inflammatory changes.  This is difficult to appreciate on the axial images and is best seen on the coronal images.  There is no portal venous gas.  No intrahepatic gas identified.  Colonic diverticulosis is present distally. Prostate and urinary bladder appear within normal limits.  No free fluid.  No aggressive osseous lesions.   Retroaortic left renal vein is present.  IMPRESSION: 1.  Pneumatosis of the ascending colon compatible  with ischemic colitis.  Mild inflammatory changes.  No portal venous gas.  No perforation or free air.  The cause of the ischemia is unclear and the visualized superior mesenteric artery appears patent; embolic infarction/ischemia possible.  The venous system also appears patent.  Mild stranding in the right lower quadrant mesentery. 2.  Nonobstructing renal calculi.  Renal cysts.  Critical Value/emergent results were called by telephone at the time of interpretation on 10/04/2011 at 1342 hours to Dr. Rosalia Hammers, who verbally acknowledged these results.  Original Report Authenticated By: Andreas Newport, M.D.   Ct Abdomen Pelvis W Contrast  10/04/2011  *RADIOLOGY REPORT*  Clinical Data:  Back pain.  Body aches.  Nausea.  CT CHEST, ABDOMEN AND PELVIS WITH CONTRAST  Technique:  Multidetector CT imaging of the chest, abdomen and pelvis was performed  following the standard protocol during bolus administration of intravenous contrast.  Contrast: OMNIPAQUE IOHEXOL 300 MG/ML  SOLN  Comparison:  02/15/2011.  CT CHEST  Findings:  Saccular distal aortic arch aneurysm remains present, without acute complicating features.  Dense mural calcification along the medial aspects of the arch.  When measured in a similar fashion as on the prior examination, maximal diameter is 3.9 cm, the same as before.  The CABG.  No axillary adenopathy.  No pericardial or pleural effusion.  Three-vessel aortic arch.  No aggressive osseous lesions.  Old left-sided rib fractures.  Stable 4 mm right upper lobe pulmonary nodule (image 25 series 4). Documented stability back to 07/19/2009 indicating benign etiology. Linear scarring is present in the left lower lobe.  IMPRESSION: 1.  No acute abnormality of the chest. 2.  CABG. 3.  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 4.  Benign 4 mm right upper lobe pulmonary nodule.  CT ABDOMEN AND PELVIS  Findings:  Liver and gallbladder within normal limits.  Spleen normal.  Left upper polar  and right lower polar renal cysts. Nonobstructing bilateral renal calculi.  Largest stone is in the left upper renal pole measuring 7 mm.  Other smaller calculi are present.  The ureters appear within normal limits.  No ureteral calculi.  Urinary bladder normal.  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 Adrenal glands are within normal limits.  Stomach, pancreas, common bile duct within normal limits.  No small bowel obstruction.  No adenopathy.  Normal appendix.  Mild inflammatory changes are present in the right lower quadrant. There is pneumatosis of the proximal ascending colon compatible ischemia with mild pericolonic inflammatory changes.  This is difficult to appreciate on the axial images and is best seen on the coronal images.  There is no portal venous gas.  No intrahepatic gas identified.  Colonic diverticulosis is present distally. Prostate and urinary bladder appear within normal limits.  No free fluid.  No aggressive osseous lesions.   Retroaortic left renal vein is present.  IMPRESSION: 1.  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  Mild inflammatory changes.  No portal venous gas.  No perforation or free air.  The cause of the ischemia is unclear and the visualized superior mesenteric artery appears patent; embolic infarction/ischemia possible.  The venous system also appears patent.  Mild stranding in the right lower quadrant mesentery. 2.  Nonobstructing renal calculi.  Renal cysts.  Critical Value/emergent results were called by telephone at the time of interpretation on 10/04/2011 at 1342 hours to Dr. Rosalia Hammers, who verbally acknowledged these results.  Original Report Authenticated By: Andreas Newport, M.D.    Scheduled Meds:   . sodium chloride   Intravenous STAT  . iohexol  20 mL Oral Q1 Hr x 2  . LORazepam  1 mg Intravenous Once  . metoprolol  2.5 mg Intravenous Q12H  . sodium chloride  1,000 mL Intravenous Once    . sodium chloride  3 mL Intravenous Q12H   Continuous Infusions:   Principal Problem:  *Ischemic bowel disease Active Problems:  HYPERLIPIDEMIA  GOUT  HYPERTENSION  CAD, ARTERY BYPASS GRAFT  THORACIC AORTIC ANEURYSM  CAD (coronary artery disease)  Carotid bruit  Right inguinal hernia-scrotum    Time spent: 35 minutes    Samuel Coleman  [facility] Pager [phone]. If 8PM-8AM, please contact night-coverage at [email or web address], password [facility] 10/05/2011, 10:12 AM  LOS: 1 day

## 2011-10-05 NOTE — Consult Note (Addendum)
Subjective Diarrhea x 2 years, fever and chills earlier this week,diffuse abd. pain, decreased apetite  Objective:66 yo with CAD, s/p CABG,  Vital signs in last 24 hours: Temp:  [98.1 F (36.7 C)-98.5 F (36.9 C)] 98.5 F (36.9 C) (08/10 1300) Pulse Rate:  [81-104] 104  (08/10 1300) Resp:  [14-19] 18  (08/10 1300) BP: (123-153)/(60-89) 153/83 mmHg (08/10 1300) SpO2:  [93 %-99 %] 97 % (08/10 1300) Weight:  [192 lb 10.9 oz (87.4 kg)] 192 lb 10.9 oz (87.4 kg) (08/09 2024) Last BM Date: 10/04/11 General:   Alert,  pleasant, cooperative in NAD Head:  Normocephalic and atraumatic. Eyes:  Sclera clear, no icterus.   Conjunctiva pink. Mouth:  No deformity or lesions, dentition normal. Neck:  Supple; no masses or thyromegaly. Heart:  Regular rate and rhythm; no murmurs, clicks, rubs,  or gallops., post thoracotomy scar Lungs:  No wheezes or rales Abdomen:  Soft, quiet bowl sounds,tender  Diffusely >RUQ, no rebound  Msk:  Symmetrical without gross deformities. Normal posture. Pulses:  Normal pulses noted. Extremities:  Without clubbing or edema. Neurologic:  Alert and  oriented x4;  grossly normal neurologically. Skin:  Intact without significant lesions or rashes.  Intake/Output from previous day:   Intake/Output this shift: Total I/O In: 0  Out: 750 [Urine:750]  Lab Results:  Basename 10/05/11 0235 10/04/11 1015  WBC 10.5 12.3*  HGB 15.5 16.3  HCT 44.7 45.5  PLT 148* 172   BMET  Basename 10/05/11 0235 10/04/11 1015  NA 136 137  K 3.9 4.4  CL 102 103  CO2 24 25  GLUCOSE 83 115*  BUN 14 19  CREATININE 0.87 0.91  CALCIUM 9.1 9.6   LFT  Basename 10/05/11 0235  PROT 6.4  ALBUMIN 3.6  AST 33  ALT 26  ALKPHOS 61  BILITOT 0.6  BILIDIR --  IBILI --   PT/INR  Basename 10/04/11 1404  LABPROT 14.6  INR 1.12   Hepatitis Panel No results found for this basename: HEPBSAG,HCVAB,HEPAIGM,HEPBIGM in the last 72 hours  Studies/Results: Ct Chest W Contrast  10/04/2011   *RADIOLOGY REPORT*  Clinical Data:  Back pain.  Body aches.  Nausea.  CT CHEST, ABDOMEN AND PELVIS WITH CONTRAST  Technique:  Multidetector CT imaging of the chest, abdomen and pelvis was performed following the standard protocol during bolus administration of intravenous contrast.  Contrast: OMNIPAQUE IOHEXOL 300 MG/ML  SOLN  Comparison:  02/15/2011.  CT CHEST  Findings:  Saccular distal aortic arch aneurysm remains present, without acute complicating features.  Dense mural calcification along the medial aspects of the arch.  When measured in a similar fashion as on the prior examination, maximal diameter is 3.9 cm, the same as before.  The CABG.  No axillary adenopathy.  No pericardial or pleural effusion.  Three-vessel aortic arch.  No aggressive osseous lesions.  Old left-sided rib fractures.  Stable 4 mm right upper lobe pulmonary nodule (image 25 series 4). Documented stability back to 07/19/2009 indicating benign etiology. Linear scarring is present in the left lower lobe.  IMPRESSION: 1.  No acute abnormality of the chest. 2.  CABG. 3.  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 4.  Benign 4 mm right upper lobe pulmonary nodule.  CT ABDOMEN AND PELVIS  Findings:  Liver and gallbladder within normal limits.  Spleen normal.  Left upper polar and right lower polar renal cysts. Nonobstructing bilateral renal calculi.  Largest stone is in the left upper renal pole measuring 7 mm.  Other smaller calculi are present.  The ureters appear within normal limits.  No ureteral calculi.  Urinary bladder normal.  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 Adrenal glands are within normal limits.  Stomach, pancreas, common bile duct within normal limits.  No small bowel obstruction.  No adenopathy.  Normal appendix.  Mild inflammatory changes are present in the right lower quadrant. There is pneumatosis of the proximal ascending colon compatible  ischemia with mild pericolonic inflammatory changes.  This is difficult to appreciate on the axial images and is best seen on the coronal images.  There is no portal venous gas.  No intrahepatic gas identified.  Colonic diverticulosis is present distally. Prostate and urinary bladder appear within normal limits.  No free fluid.  No aggressive osseous lesions.   Retroaortic left renal vein is present.  IMPRESSION: 1.  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  Mild inflammatory changes.  No portal venous gas.  No perforation or free air.  The cause of the ischemia is unclear and the visualized superior mesenteric artery appears patent; embolic infarction/ischemia possible.  The venous system also appears patent.  Mild stranding in the right lower quadrant mesentery. 2.  Nonobstructing renal calculi.  Renal cysts.  Critical Value/emergent results were called by telephone at the time of interpretation on 10/04/2011 at 1342 hours to Dr. Rosalia Hammers, who verbally acknowledged these results.  Original Report Authenticated By: Andreas Newport, M.D.   Ct Abdomen Pelvis W Contrast  10/04/2011  *RADIOLOGY REPORT*  Clinical Data:  Back pain.  Body aches.  Nausea.  CT CHEST, ABDOMEN AND PELVIS WITH CONTRAST  Technique:  Multidetector CT imaging of the chest, abdomen and pelvis was performed following the standard protocol during bolus administration of intravenous contrast.  Contrast: OMNIPAQUE IOHEXOL 300 MG/ML  SOLN  Comparison:  02/15/2011.  CT CHEST  Findings:  Saccular distal aortic arch aneurysm remains present, without acute complicating features.  Dense mural calcification along the medial aspects of the arch.  When measured in a similar fashion as on the prior examination, maximal diameter is 3.9 cm, the same as before.  The CABG.  No axillary adenopathy.  No pericardial or pleural effusion.  Three-vessel aortic arch.  No aggressive osseous lesions.  Old left-sided rib fractures.  Stable 4 mm  right upper lobe pulmonary nodule (image 25 series 4). Documented stability back to 07/19/2009 indicating benign etiology. Linear scarring is present in the left lower lobe.  IMPRESSION: 1.  No acute abnormality of the chest. 2.  CABG. 3.  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 4.  Benign 4 mm right upper lobe pulmonary nodule.  CT ABDOMEN AND PELVIS  Findings:  Liver and gallbladder within normal limits.  Spleen normal.  Left upper polar and right lower polar renal cysts. Nonobstructing bilateral renal calculi.  Largest stone is in the left upper renal pole measuring 7 mm.  Other smaller calculi are present.  The ureters appear within normal limits.  No ureteral calculi.  Urinary bladder normal.  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 Adrenal glands are within normal limits.  Stomach, pancreas, common bile duct within normal limits.  No small bowel obstruction.  No adenopathy.  Normal appendix.  Mild inflammatory changes are present in the right lower quadrant. There is pneumatosis of the proximal ascending colon compatible ischemia with mild pericolonic inflammatory changes.  This is difficult to appreciate on the axial images  and is best seen on the coronal images.  There is no portal venous gas.  No intrahepatic gas identified.  Colonic diverticulosis is present distally. Prostate and urinary bladder appear within normal limits.  No free fluid.  No aggressive osseous lesions.   Retroaortic left renal vein is present.  IMPRESSION: 1.  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  Mild inflammatory changes.  No portal venous gas.  No perforation or free air.  The cause of the ischemia is unclear and the visualized superior mesenteric artery appears patent; embolic infarction/ischemia possible.  The venous system also appears patent.  Mild stranding in the right lower quadrant mesentery. 2.  Nonobstructing renal  calculi.  Renal cysts.  Critical Value/emergent results were called by telephone at the time of interpretation on 10/04/2011 at 1342 hours to Dr. Rosalia Hammers, who verbally acknowledged these results.  Original Report Authenticated By: Andreas Newport, M.D.     ASSESSMENT:   Principal Problem:  *Ischemic bowel disease Active Problems:  HYPERLIPIDEMIA  GOUT  HYPERTENSION  CAD, ARTERY BYPASS GRAFT  THORACIC AORTIC ANEURYSM  CAD (coronary artery disease)  Carotid bruit  Right inguinal hernia-scrotum     PLAN:   Acute colitis,  Likely ischemic, but infectious causes have to be ruled out because of diarrhea x 2weeks which preceded the abd. pain, We  are checking stool studies, Because of a possibility of a low flow state causing colitis ,I would hold all antihypertensive meds. Ct scan shows pneumatosis in the right colon wall- antibiotic coverage is indicated- Flagyl and Cipro. Bowl rest. He is up to date on his colonoscopy ( had polyp 2011).     LOS: 1 day   Lina Sar  10/05/2011, 2:26 PM  Addendum: correction in HPI, incorrect entry  Stating that pt had diarrhea for 2 years. The correct statement should have been "diarrhea x 2 weeks, chills, sweats and hot flashes.Marland KitchenMarland Kitchen

## 2011-10-05 NOTE — Progress Notes (Signed)
<principal problem not specified>  Subjective: Feels OK this am, no abd pain, back pain improved. No nausea, passing gas but not had a BM.  Objective: Vital signs in last 24 hours: Temp:  [97.8 F (36.6 C)-98.5 F (36.9 C)] 98.1 F (36.7 C) (08/10 0523) Pulse Rate:  [70-97] 97  (08/10 0523) Resp:  [14-21] 17  (08/10 0523) BP: (117-150)/(60-89) 141/89 mmHg (08/10 0523) SpO2:  [93 %-100 %] 93 % (08/10 0523) Weight:  [192 lb 10.9 oz (87.4 kg)] 192 lb 10.9 oz (87.4 kg) (08/09 2024) Last BM Date: 10/04/11  Intake/Output from previous day:   Intake/Output this shift:    General appearance: alert, cooperative and no distress Resp: clear to auscultation bilaterally GI: soft, non-tender; bowel sounds normal; no masses,  no organomegaly and RLQ apparently improved from yesterday's exam  Lab Results:  Results for orders placed during the hospital encounter of 10/04/11 (from the past 24 hour(s))  CBC WITH DIFFERENTIAL     Status: Abnormal   Collection Time   10/04/11 10:15 AM      Component Value Range   WBC 12.3 (*) 4.0 - 10.5 K/uL   RBC 4.88  4.22 - 5.81 MIL/uL   Hemoglobin 16.3  13.0 - 17.0 g/dL   HCT 16.1  09.6 - 04.5 %   MCV 93.2  78.0 - 100.0 fL   MCH 33.4  26.0 - 34.0 pg   MCHC 35.8  30.0 - 36.0 g/dL   RDW 40.9  81.1 - 91.4 %   Platelets 172  150 - 400 K/uL   Neutrophils Relative 74  43 - 77 %   Neutro Abs 9.1 (*) 1.7 - 7.7 K/uL   Lymphocytes Relative 13  12 - 46 %   Lymphs Abs 1.6  0.7 - 4.0 K/uL   Monocytes Relative 12  3 - 12 %   Monocytes Absolute 1.4 (*) 0.1 - 1.0 K/uL   Eosinophils Relative 1  0 - 5 %   Eosinophils Absolute 0.1  0.0 - 0.7 K/uL   Basophils Relative 0  0 - 1 %   Basophils Absolute 0.0  0.0 - 0.1 K/uL  COMPREHENSIVE METABOLIC PANEL     Status: Abnormal   Collection Time   10/04/11 10:15 AM      Component Value Range   Sodium 137  135 - 145 mEq/L   Potassium 4.4  3.5 - 5.1 mEq/L   Chloride 103  96 - 112 mEq/L   CO2 25  19 - 32 mEq/L   Glucose, Bld  115 (*) 70 - 99 mg/dL   BUN 19  6 - 23 mg/dL   Creatinine, Ser 7.82  0.50 - 1.35 mg/dL   Calcium 9.6  8.4 - 95.6 mg/dL   Total Protein 6.8  6.0 - 8.3 g/dL   Albumin 3.9  3.5 - 5.2 g/dL   AST 35  0 - 37 U/L   ALT 32  0 - 53 U/L   Alkaline Phosphatase 67  39 - 117 U/L   Total Bilirubin 0.4  0.3 - 1.2 mg/dL   GFR calc non Af Amer 85 (*) >90 mL/min   GFR calc Af Amer >90  >90 mL/min  LIPASE, BLOOD     Status: Normal   Collection Time   10/04/11 10:15 AM      Component Value Range   Lipase 29  11 - 59 U/L  TROPONIN I     Status: Normal   Collection Time   10/04/11 10:20  AM      Component Value Range   Troponin I <0.30  <0.30 ng/mL  URINALYSIS, ROUTINE W REFLEX MICROSCOPIC     Status: Normal   Collection Time   10/04/11 11:25 AM      Component Value Range   Color, Urine YELLOW  YELLOW   APPearance CLEAR  CLEAR   Specific Gravity, Urine 1.024  1.005 - 1.030   pH 5.5  5.0 - 8.0   Glucose, UA NEGATIVE  NEGATIVE mg/dL   Hgb urine dipstick NEGATIVE  NEGATIVE   Bilirubin Urine NEGATIVE  NEGATIVE   Ketones, ur NEGATIVE  NEGATIVE mg/dL   Protein, ur NEGATIVE  NEGATIVE mg/dL   Urobilinogen, UA 0.2  0.0 - 1.0 mg/dL   Nitrite NEGATIVE  NEGATIVE   Leukocytes, UA NEGATIVE  NEGATIVE  PROTIME-INR     Status: Normal   Collection Time   10/04/11  2:04 PM      Component Value Range   Prothrombin Time 14.6  11.6 - 15.2 seconds   INR 1.12  0.00 - 1.49  LACTIC ACID, PLASMA     Status: Normal   Collection Time   10/04/11  2:04 PM      Component Value Range   Lactic Acid, Venous 0.9  0.5 - 2.2 mmol/L  CARDIAC PANEL(CRET KIN+CKTOT+MB+TROPI)     Status: Abnormal   Collection Time   10/04/11  6:43 PM      Component Value Range   Total CK 178  7 - 232 U/L   CK, MB 5.6 (*) 0.3 - 4.0 ng/mL   Troponin I <0.30  <0.30 ng/mL   Relative Index 3.1 (*) 0.0 - 2.5  TSH     Status: Normal   Collection Time   10/04/11  6:44 PM      Component Value Range   TSH 0.810  0.350 - 4.500 uIU/mL  COMPREHENSIVE METABOLIC  PANEL     Status: Abnormal   Collection Time   10/05/11  2:35 AM      Component Value Range   Sodium 136  135 - 145 mEq/L   Potassium 3.9  3.5 - 5.1 mEq/L   Chloride 102  96 - 112 mEq/L   CO2 24  19 - 32 mEq/L   Glucose, Bld 83  70 - 99 mg/dL   BUN 14  6 - 23 mg/dL   Creatinine, Ser 0.45  0.50 - 1.35 mg/dL   Calcium 9.1  8.4 - 40.9 mg/dL   Total Protein 6.4  6.0 - 8.3 g/dL   Albumin 3.6  3.5 - 5.2 g/dL   AST 33  0 - 37 U/L   ALT 26  0 - 53 U/L   Alkaline Phosphatase 61  39 - 117 U/L   Total Bilirubin 0.6  0.3 - 1.2 mg/dL   GFR calc non Af Amer 87 (*) >90 mL/min   GFR calc Af Amer >90  >90 mL/min  CBC     Status: Abnormal   Collection Time   10/05/11  2:35 AM      Component Value Range   WBC 10.5  4.0 - 10.5 K/uL   RBC 4.74  4.22 - 5.81 MIL/uL   Hemoglobin 15.5  13.0 - 17.0 g/dL   HCT 81.1  91.4 - 78.2 %   MCV 94.3  78.0 - 100.0 fL   MCH 32.7  26.0 - 34.0 pg   MCHC 34.7  30.0 - 36.0 g/dL   RDW 95.6  21.3 - 08.6 %  Platelets 148 (*) 150 - 400 K/uL  CARDIAC PANEL(CRET KIN+CKTOT+MB+TROPI)     Status: Abnormal   Collection Time   10/05/11  2:35 AM      Component Value Range   Total CK 127  7 - 232 U/L   CK, MB 4.4 (*) 0.3 - 4.0 ng/mL   Troponin I <0.30  <0.30 ng/mL   Relative Index 3.5 (*) 0.0 - 2.5     Studies/Results Radiology     MEDS, Scheduled    . sodium chloride   Intravenous STAT  . iohexol  20 mL Oral Q1 Hr x 2  . LORazepam  1 mg Intravenous Once  . metoprolol  2.5 mg Intravenous Q12H  . sodium chloride  1,000 mL Intravenous Once  . sodium chloride  3 mL Intravenous Q12H     Assessment: <principal problem not specified> Coliltis, improved clinically, wbc wnl.  Plan: Could start liquids. GI consult pending? Will follow  LOS: 1 day    Currie Paris, MD, [facility], [place] [phone]   10/05/2011 8:04 AM

## 2011-10-06 MED ORDER — METOPROLOL TARTRATE 50 MG PO TABS
50.0000 mg | ORAL_TABLET | Freq: Two times a day (BID) | ORAL | Status: DC
  Administered 2011-10-07 (×2): 50 mg via ORAL
  Filled 2011-10-06 (×3): qty 1

## 2011-10-06 MED ORDER — ACETAMINOPHEN 325 MG PO TABS
650.0000 mg | ORAL_TABLET | ORAL | Status: DC | PRN
  Administered 2011-10-06 – 2011-10-07 (×2): 650 mg via ORAL
  Filled 2011-10-06 (×2): qty 2

## 2011-10-06 NOTE — Progress Notes (Signed)
[facility] PROGRESS NOTE  Samuel Coleman JYN:[id number] DOB: [birth year] DOA: 10/04/2011 PCP: Pearson Grippe, MD  Assessment/Plan: Principal Problem:  *Ischemic bowel disease Active Problems:  HYPERLIPIDEMIA  GOUT  HYPERTENSION  CAD, ARTERY BYPASS GRAFT  THORACIC AORTIC ANEURYSM  CAD (coronary artery disease)  Carotid bruit  Right inguinal hernia-scrotum  Other and unspecified noninfectious gastroenteritis and colitis  1. Ischemic colitis: abdominal pain is resolved.  Appreciated surgery input and GI consult. Restarted diet.  Continue to monitor.   2. CAD, CABG, : On home medications including, lisinopril, metoprolol.  3. Stable Descending aorta aneurysm  4. Gout: no flare up.   5. Leukocytosis: resolved. No source of infection.   6. Mild thrombocytopenia: not on lovenox . Continue to monitor.   Code Status: FULL CODE Family Communication: discussed with wife at bedside. Disposition Plan: possibly home.    Brief narrative: 66 year old gentleman came for worsening back pain since yesterday, feels the pain is similar to the pain he had before MI. He underwent a CT chest and abd with contrst and found to have ischemic colitis. He is admitted to medical service for observation and surgery consulted for further recommendations. No abd pain, slight nausea no vomiting, no sob, headache, palpitations. No blurry vision, no headache, no orthopnea, or PND. Loose bowel movements since 2 weeks   Consultants:  Surgery   GI  Procedures:  CT CHEST AND ABDOMEN AND PELVIS  Antibiotics:  NONE  HPI/Subjective: Much better, pain is better controlled.   Objective: Filed Vitals:   10/05/11 2027 10/05/11 2123 10/06/11 0502 10/06/11 1018  BP:  128/79 137/80 112/75  Pulse:  81 79 84  Temp:  98.5 F (36.9 C) 98.2 F (36.8 C) 98.4 F (36.9 C)  TempSrc:  Oral Oral Oral  Resp:  20 20 18   Height:      Weight: 108.682 kg (239 lb 9.6 oz)     SpO2:  94% 92% 95%     Intake/Output Summary (Last 24 hours) at 10/06/11 1041 Last data filed at 10/06/11 0602  Gross per 24 hour  Intake    840 ml  Output    750 ml  Net     90 ml    Exam:   General:  Alert afebrile comfortable  Cardiovascular: s1s2  Respiratory: CTAB  Abdomen: soft NT ND bs+  Data Reviewed: Basic Metabolic Panel:  Lab 10/05/11 8657 10/04/11 1015  NA 136 137  K 3.9 4.4  CL 102 103  CO2 24 25  GLUCOSE 83 115*  BUN 14 19  CREATININE 0.87 0.91  CALCIUM 9.1 9.6  MG -- --  PHOS -- --   Liver Function Tests:  Lab 10/05/11 0235 10/04/11 1015  AST 33 35  ALT 26 32  ALKPHOS 61 67  BILITOT 0.6 0.4  PROT 6.4 6.8  ALBUMIN 3.6 3.9    Lab 10/04/11 1015  LIPASE 29  AMYLASE --   No results found for this basename: AMMONIA:5 in the last 168 hours CBC:  Lab 10/05/11 0235 10/04/11 1015  WBC 10.5 12.3*  NEUTROABS -- 9.1*  HGB 15.5 16.3  HCT 44.7 45.5  MCV 94.3 93.2  PLT 148* 172   Cardiac Enzymes:  Lab 10/05/11 1003 10/05/11 0235 10/04/11 1843 10/04/11 1020  CKTOTAL 136 127 178 --  CKMB 3.5 4.4* 5.6* --  CKMBINDEX -- -- -- --  TROPONINI <0.30 <0.30 <0.30 <0.30   BNP (last 3 results) No results found for this basename: PROBNP:3 in the last  8760 hours CBG: No results found for this basename: GLUCAP:5 in the last 168 hours  Recent Results (from the past 240 hour(s))  CLOSTRIDIUM DIFFICILE BY PCR     Status: Normal   Collection Time   10/05/11  6:45 PM      Component Value Range Status Comment   C difficile by pcr NEGATIVE  NEGATIVE Final      Studies: Ct Chest W Contrast  10/04/2011  *RADIOLOGY REPORT*  Clinical Data:  Back pain.  Body aches.  Nausea.  CT CHEST, ABDOMEN AND PELVIS WITH CONTRAST  Technique:  Multidetector CT imaging of the chest, abdomen and pelvis was performed following the standard protocol during bolus administration of intravenous contrast.  Contrast: OMNIPAQUE IOHEXOL 300 MG/ML  SOLN  Comparison:  02/15/2011.  CT CHEST  Findings:   Saccular distal aortic arch aneurysm remains present, without acute complicating features.  Dense mural calcification along the medial aspects of the arch.  When measured in a similar fashion as on the prior examination, maximal diameter is 3.9 cm, the same as before.  The CABG.  No axillary adenopathy.  No pericardial or pleural effusion.  Three-vessel aortic arch.  No aggressive osseous lesions.  Old left-sided rib fractures.  Stable 4 mm right upper lobe pulmonary nodule (image 25 series 4). Documented stability back to 07/19/2009 indicating benign etiology. Linear scarring is present in the left lower lobe.  IMPRESSION: 1.  No acute abnormality of the chest. 2.  CABG. 3.  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 4.  Benign 4 mm right upper lobe pulmonary nodule.  CT ABDOMEN AND PELVIS  Findings:  Liver and gallbladder within normal limits.  Spleen normal.  Left upper polar and right lower polar renal cysts. Nonobstructing bilateral renal calculi.  Largest stone is in the left upper renal pole measuring 7 mm.  Other smaller calculi are present.  The ureters appear within normal limits.  No ureteral calculi.  Urinary bladder normal.  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 Adrenal glands are within normal limits.  Stomach, pancreas, common bile duct within normal limits.  No small bowel obstruction.  No adenopathy.  Normal appendix.  Mild inflammatory changes are present in the right lower quadrant. There is pneumatosis of the proximal ascending colon compatible ischemia with mild pericolonic inflammatory changes.  This is difficult to appreciate on the axial images and is best seen on the coronal images.  There is no portal venous gas.  No intrahepatic gas identified.  Colonic diverticulosis is present distally. Prostate and urinary bladder appear within normal limits.  No free fluid.  No aggressive osseous lesions.   Retroaortic left renal  vein is present.  IMPRESSION: 1.  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  Mild inflammatory changes.  No portal venous gas.  No perforation or free air.  The cause of the ischemia is unclear and the visualized superior mesenteric artery appears patent; embolic infarction/ischemia possible.  The venous system also appears patent.  Mild stranding in the right lower quadrant mesentery. 2.  Nonobstructing renal calculi.  Renal cysts.  Critical Value/emergent results were called by telephone at the time of interpretation on 10/04/2011 at 1342 hours to Dr. Rosalia Hammers, who verbally acknowledged these results.  Original Report Authenticated By: Andreas Newport, M.D.   Ct Abdomen Pelvis W Contrast  10/04/2011  *RADIOLOGY REPORT*  Clinical Data:  Back pain.  Body aches.  Nausea.  CT CHEST, ABDOMEN AND  PELVIS WITH CONTRAST  Technique:  Multidetector CT imaging of the chest, abdomen and pelvis was performed following the standard protocol during bolus administration of intravenous contrast.  Contrast: OMNIPAQUE IOHEXOL 300 MG/ML  SOLN  Comparison:  02/15/2011.  CT CHEST  Findings:  Saccular distal aortic arch aneurysm remains present, without acute complicating features.  Dense mural calcification along the medial aspects of the arch.  When measured in a similar fashion as on the prior examination, maximal diameter is 3.9 cm, the same as before.  The CABG.  No axillary adenopathy.  No pericardial or pleural effusion.  Three-vessel aortic arch.  No aggressive osseous lesions.  Old left-sided rib fractures.  Stable 4 mm right upper lobe pulmonary nodule (image 25 series 4). Documented stability back to 07/19/2009 indicating benign etiology. Linear scarring is present in the left lower lobe.  IMPRESSION: 1.  No acute abnormality of the chest. 2.  CABG. 3.  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 4.  Benign 4 mm right upper lobe pulmonary nodule.  CT ABDOMEN AND PELVIS  Findings:   Liver and gallbladder within normal limits.  Spleen normal.  Left upper polar and right lower polar renal cysts. Nonobstructing bilateral renal calculi.  Largest stone is in the left upper renal pole measuring 7 mm.  Other smaller calculi are present.  The ureters appear within normal limits.  No ureteral calculi.  Urinary bladder normal.  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 Adrenal glands are within normal limits.  Stomach, pancreas, common bile duct within normal limits.  No small bowel obstruction.  No adenopathy.  Normal appendix.  Mild inflammatory changes are present in the right lower quadrant. There is pneumatosis of the proximal ascending colon compatible ischemia with mild pericolonic inflammatory changes.  This is difficult to appreciate on the axial images and is best seen on the coronal images.  There is no portal venous gas.  No intrahepatic gas identified.  Colonic diverticulosis is present distally. Prostate and urinary bladder appear within normal limits.  No free fluid.  No aggressive osseous lesions.   Retroaortic left renal vein is present.  IMPRESSION: 1.  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  Mild inflammatory changes.  No portal venous gas.  No perforation or free air.  The cause of the ischemia is unclear and the visualized superior mesenteric artery appears patent; embolic infarction/ischemia possible.  The venous system also appears patent.  Mild stranding in the right lower quadrant mesentery. 2.  Nonobstructing renal calculi.  Renal cysts.  Critical Value/emergent results were called by telephone at the time of interpretation on 10/04/2011 at 1342 hours to Dr. Rosalia Hammers, who verbally acknowledged these results.  Original Report Authenticated By: Andreas Newport, M.D.    Scheduled Meds:    . ciprofloxacin  400 mg Intravenous Q12H  . lisinopril  20 mg Oral Daily  . metronidazole  500 mg Intravenous Q6H   . pantoprazole  40 mg Oral Q1200  . sodium chloride  3 mL Intravenous Q12H  . DISCONTD: metoprolol  50 mg Oral BID   Continuous Infusions:   Principal Problem:  *Ischemic bowel disease Active Problems:  HYPERLIPIDEMIA  GOUT  HYPERTENSION  CAD, ARTERY BYPASS GRAFT  THORACIC AORTIC ANEURYSM  CAD (coronary artery disease)  Carotid bruit  Right inguinal hernia-scrotum  Other and unspecified noninfectious gastroenteritis and colitis    Time spent: 35 minutes    Florence Antonelli  [facility] Pager [phone]. If  8PM-8AM, please contact night-coverage at [email or web address], password [facility] 10/06/2011, 10:41 AM  LOS: 2 days

## 2011-10-06 NOTE — Progress Notes (Signed)
Ischemic bowel disease  Subjective: Feels better, solid stool, no pain, tol diet  Objective: Vital signs in last 24 hours: Temp:  [98.2 F (36.8 C)-98.5 F (36.9 C)] 98.2 F (36.8 C) (08/11 0502) Pulse Rate:  [79-104] 79  (08/11 0502) Resp:  [17-20] 20  (08/11 0502) BP: (124-153)/(73-83) 137/80 mmHg (08/11 0502) SpO2:  [92 %-97 %] 92 % (08/11 0502) Weight:  [239 lb 9.6 oz (811.914 kg)] 239 lb 9.6 oz (108.682 kg) (08/10 2027) Last BM Date: 10/05/11  Intake/Output from previous day: 08/10 0701 - 08/11 0700 In: 840 [P.O.:240; IV Piggyback:600] Out: 750 [Urine:750] Intake/Output this shift:    General appearance: alert and no distress GI: soft, non-tender; bowel sounds normal; no masses,  no organomegaly  Lab Results:  Results for orders placed during the hospital encounter of 10/04/11 (from the past 24 hour(s))  CARDIAC PANEL(CRET KIN+CKTOT+MB+TROPI)     Status: Abnormal   Collection Time   10/05/11 10:03 AM      Component Value Range   Total CK 136  7 - 232 U/L   CK, MB 3.5  0.3 - 4.0 ng/mL   Troponin I <0.30  <0.30 ng/mL   Relative Index 2.6 (*) 0.0 - 2.5  CLOSTRIDIUM DIFFICILE BY PCR     Status: Normal   Collection Time   10/05/11  6:45 PM      Component Value Range   C difficile by pcr NEGATIVE  NEGATIVE     Studies/Results Radiology     MEDS, Scheduled    . ciprofloxacin  400 mg Intravenous Q12H  . lisinopril  20 mg Oral Daily  . metronidazole  500 mg Intravenous Q6H  . pantoprazole  40 mg Oral Q1200  . sodium chloride  3 mL Intravenous Q12H  . DISCONTD: metoprolol  2.5 mg Intravenous Q12H  . DISCONTD: metoprolol  50 mg Oral BID     Assessment: Ischemic bowel disease Improved  RIH, not symptomatic at present  Plan: Advance diet. We will see again prn. He should not consider hernia repair for at least two months  LOS: 2 days    Currie Paris, MD, [facility], [place] [phone]   10/06/2011 9:04 AM

## 2011-10-06 NOTE — Progress Notes (Signed)
Subjective Tolerated full liquids, had formed stool,   Objective: feeling better, ready to advance diet Vital signs in last 24 hours: Temp:  [98.2 F (36.8 C)-98.5 F (36.9 C)] 98.2 F (36.8 C) (08/11 0502) Pulse Rate:  [79-104] 79  (08/11 0502) Resp:  [17-20] 20  (08/11 0502) BP: (124-153)/(73-83) 137/80 mmHg (08/11 0502) SpO2:  [92 %-97 %] 92 % (08/11 0502) Weight:  [239 lb 9.6 oz (914.782 kg)] 239 lb 9.6 oz (108.682 kg) (08/10 2027) Last BM Date: 10/05/11 General:   Alert,  pleasant, cooperative in NAD Head:  Normocephalic and atraumatic. Neck:  Supple; no masses or thyromegaly. Heart:  Regular rate and rhythm; no murmurs, clicks, rubs,  or gallops. Lungs:  No wheezes or rales Abdomen:  Soft, minimal discomfort RUQ, quiet bowl sounds,  Msk:  Symmetrical without gross deformities. Normal posture. Pulses:  Normal pulses noted. Extremities:  Without clubbing or edema. Neurologic:  Alert and  oriented x4;  grossly normal neurologically. Skin:  Intact without significant lesions or rashes.  Intake/Output from previous day: 08/10 0701 - 08/11 0700 In: 840 [P.O.:240; IV Piggyback:600] Out: 750 [Urine:750] Intake/Output this shift:    Lab Results:  Basename 10/05/11 0235 10/04/11 1015  WBC 10.5 12.3*  HGB 15.5 16.3  HCT 44.7 45.5  PLT 148* 172   BMET  Basename 10/05/11 0235 10/04/11 1015  NA 136 137  K 3.9 4.4  CL 102 103  CO2 24 25  GLUCOSE 83 115*  BUN 14 19  CREATININE 0.87 0.91  CALCIUM 9.1 9.6   LFT  Basename 10/05/11 0235  PROT 6.4  ALBUMIN 3.6  AST 33  ALT 26  ALKPHOS 61  BILITOT 0.6  BILIDIR --  IBILI --   PT/INR  Basename 10/04/11 1404  LABPROT 14.6  INR 1.12   Hepatitis Panel No results found for this basename: HEPBSAG,HCVAB,HEPAIGM,HEPBIGM in the last 72 hours  Studies/Results: Ct Chest W Contrast  10/04/2011  *RADIOLOGY REPORT*  Clinical Data:  Back pain.  Body aches.  Nausea.  CT CHEST, ABDOMEN AND PELVIS WITH CONTRAST  Technique:   Multidetector CT imaging of the chest, abdomen and pelvis was performed following the standard protocol during bolus administration of intravenous contrast.  Contrast: OMNIPAQUE IOHEXOL 300 MG/ML  SOLN  Comparison:  02/15/2011.  CT CHEST  Findings:  Saccular distal aortic arch aneurysm remains present, without acute complicating features.  Dense mural calcification along the medial aspects of the arch.  When measured in a similar fashion as on the prior examination, maximal diameter is 3.9 cm, the same as before.  The CABG.  No axillary adenopathy.  No pericardial or pleural effusion.  Three-vessel aortic arch.  No aggressive osseous lesions.  Old left-sided rib fractures.  Stable 4 mm right upper lobe pulmonary nodule (image 25 series 4). Documented stability back to 07/19/2009 indicating benign etiology. Linear scarring is present in the left lower lobe.  IMPRESSION: 1.  No acute abnormality of the chest. 2.  CABG. 3.  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 4.  Benign 4 mm right upper lobe pulmonary nodule.  CT ABDOMEN AND PELVIS  Findings:  Liver and gallbladder within normal limits.  Spleen normal.  Left upper polar and right lower polar renal cysts. Nonobstructing bilateral renal calculi.  Largest stone is in the left upper renal pole measuring 7 mm.  Other smaller calculi are present.  The ureters appear within normal limits.  No ureteral calculi.  Urinary bladder normal.  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 Adrenal glands are within normal limits.  Stomach, pancreas, common bile duct within normal limits.  No small bowel obstruction.  No adenopathy.  Normal appendix.  Mild inflammatory changes are present in the right lower quadrant. There is pneumatosis of the proximal ascending colon compatible ischemia with mild pericolonic inflammatory changes.  This is difficult to appreciate on the axial images and is best seen on the  coronal images.  There is no portal venous gas.  No intrahepatic gas identified.  Colonic diverticulosis is present distally. Prostate and urinary bladder appear within normal limits.  No free fluid.  No aggressive osseous lesions.   Retroaortic left renal vein is present.  IMPRESSION: 1.  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  Mild inflammatory changes.  No portal venous gas.  No perforation or free air.  The cause of the ischemia is unclear and the visualized superior mesenteric artery appears patent; embolic infarction/ischemia possible.  The venous system also appears patent.  Mild stranding in the right lower quadrant mesentery. 2.  Nonobstructing renal calculi.  Renal cysts.  Critical Value/emergent results were called by telephone at the time of interpretation on 10/04/2011 at 1342 hours to Dr. Rosalia Hammers, who verbally acknowledged these results.  Original Report Authenticated By: Andreas Newport, M.D.   Ct Abdomen Pelvis W Contrast  10/04/2011  *RADIOLOGY REPORT*  Clinical Data:  Back pain.  Body aches.  Nausea.  CT CHEST, ABDOMEN AND PELVIS WITH CONTRAST  Technique:  Multidetector CT imaging of the chest, abdomen and pelvis was performed following the standard protocol during bolus administration of intravenous contrast.  Contrast: OMNIPAQUE IOHEXOL 300 MG/ML  SOLN  Comparison:  02/15/2011.  CT CHEST  Findings:  Saccular distal aortic arch aneurysm remains present, without acute complicating features.  Dense mural calcification along the medial aspects of the arch.  When measured in a similar fashion as on the prior examination, maximal diameter is 3.9 cm, the same as before.  The CABG.  No axillary adenopathy.  No pericardial or pleural effusion.  Three-vessel aortic arch.  No aggressive osseous lesions.  Old left-sided rib fractures.  Stable 4 mm right upper lobe pulmonary nodule (image 25 series 4). Documented stability back to 07/19/2009 indicating benign etiology. Linear  scarring is present in the left lower lobe.  IMPRESSION: 1.  No acute abnormality of the chest. 2.  CABG. 3.  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 4.  Benign 4 mm right upper lobe pulmonary nodule.  CT ABDOMEN AND PELVIS  Findings:  Liver and gallbladder within normal limits.  Spleen normal.  Left upper polar and right lower polar renal cysts. Nonobstructing bilateral renal calculi.  Largest stone is in the left upper renal pole measuring 7 mm.  Other smaller calculi are present.  The ureters appear within normal limits.  No ureteral calculi.  Urinary bladder normal.  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 Adrenal glands are within normal limits.  Stomach, pancreas, common bile duct within normal limits.  No small bowel obstruction.  No adenopathy.  Normal appendix.  Mild inflammatory changes are present in the right lower quadrant. There is pneumatosis of the proximal ascending colon compatible ischemia with mild pericolonic inflammatory changes.  This is difficult to appreciate on the axial images and is best seen on the coronal images.  There is no portal venous gas.  No intrahepatic gas identified.  Colonic diverticulosis is  present distally. Prostate and urinary bladder appear within normal limits.  No free fluid.  No aggressive osseous lesions.   Retroaortic left renal vein is present.  IMPRESSION: 1.  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  Mild inflammatory changes.  No portal venous gas.  No perforation or free air.  The cause of the ischemia is unclear and the visualized superior mesenteric artery appears patent; embolic infarction/ischemia possible.  The venous system also appears patent.  Mild stranding in the right lower quadrant mesentery. 2.  Nonobstructing renal calculi.  Renal cysts.  Critical Value/emergent results were called by telephone at the time of interpretation on 10/04/2011 at 1342  hours to Dr. Rosalia Hammers, who verbally acknowledged these results.  Original Report Authenticated By: Andreas Newport, M.D.     ASSESSMENT:   Principal Problem:  *Ischemic bowel disease Active Problems:  HYPERLIPIDEMIA  GOUT  HYPERTENSION  CAD, ARTERY BYPASS GRAFT  THORACIC AORTIC ANEURYSM  CAD (coronary artery disease)  Carotid bruit  Right inguinal hernia-scrotum  Other and unspecified noninfectious gastroenteritis and colitis     PLAN:   Resolving acute colitis, likely ischemic, physical exam much improved since yesterday, C.Diff negative. OK to advance diet. I asked pt to check BP's at home  and hold his meds if BP runs  less than 100 systolic..     LOS: 2 days   Lina Sar  10/06/2011, 7:56 AM

## 2011-10-07 ENCOUNTER — Encounter (HOSPITAL_COMMUNITY): Payer: Self-pay

## 2011-10-07 LAB — CBC
Platelets: 158 10*3/uL (ref 150–400)
RBC: 4.64 MIL/uL (ref 4.22–5.81)
RDW: 13.3 % (ref 11.5–15.5)
WBC: 8.8 10*3/uL (ref 4.0–10.5)

## 2011-10-07 LAB — BASIC METABOLIC PANEL
CO2: 29 mEq/L (ref 19–32)
Calcium: 9.6 mg/dL (ref 8.4–10.5)
Chloride: 104 mEq/L (ref 96–112)
GFR calc Af Amer: >90 mL/min (ref >90)
Sodium: 139 mEq/L (ref 135–145)

## 2011-10-07 NOTE — Progress Notes (Signed)
Utilization review completed.  

## 2011-10-07 NOTE — Progress Notes (Signed)
Pt got d/c instructions,IV was d/c,tele was d/c.pt. Ready to go home

## 2011-10-09 ENCOUNTER — Telehealth: Payer: Self-pay | Admitting: Cardiovascular Disease

## 2011-10-09 ENCOUNTER — Encounter (HOSPITAL_COMMUNITY): Payer: Self-pay

## 2011-10-09 NOTE — Telephone Encounter (Signed)
Spoke with pt. He is not having any problems but was told to follow up with Dr. Clifton James soon after recent hospitalization.  Pt will come in to see Dr. Clifton James on October 11, 2011 at 9:00

## 2011-10-09 NOTE — Telephone Encounter (Signed)
New Problem:    Patient called in wanting to be seen by Dr. Clifton James within the next two weeks.  Was discharged form the hospital on 10/07/11 and was instructed to call ad get an appointment within the next two weeks.  When offered an appointment with on of the PA's, patient stated that the ED physician told him that if he called and told Dr. Clifton James what was going on that he would work him in quickly.  Please call back.

## 2011-10-09 NOTE — Telephone Encounter (Signed)
Left message to call back  

## 2011-10-11 ENCOUNTER — Ambulatory Visit (INDEPENDENT_AMBULATORY_CARE_PROVIDER_SITE_OTHER): Payer: Medicare Other | Admitting: Cardiovascular Disease

## 2011-10-11 ENCOUNTER — Encounter: Payer: Self-pay | Admitting: Cardiovascular Disease

## 2011-10-11 ENCOUNTER — Encounter (HOSPITAL_COMMUNITY): Payer: Self-pay

## 2011-10-11 VITALS — BP 119/72 | HR 73 | Ht 70.0 in | Wt 198.0 lb

## 2011-10-11 DIAGNOSIS — I2510 Atherosclerotic heart disease of native coronary artery without angina pectoris: Secondary | ICD-10-CM

## 2011-10-11 MED ORDER — METOPROLOL TARTRATE 25 MG PO TABS: 25.0000 mg | ORAL_TABLET | Freq: Two times a day (BID) | ORAL | Status: DC

## 2011-10-11 NOTE — Assessment & Plan Note (Signed)
Stable. I do not think this is related to his cardiac disease. Will lower Lopressor to 25 mg po BID. Will continue Lisinopril, ASA and statin. Lipids followed in primary care and well controlled per pt. BP well controlled.

## 2011-10-11 NOTE — Patient Instructions (Addendum)
Your physician wants you to follow-up in: 6 months. You will receive a reminder letter in the mail two months in advance. If you don't receive a letter, please call our office to schedule the follow-up appointment.  Your physician has recommended you make the following change in your medication:  Decrease lopressor to 25 mg by mouth twice daily  

## 2011-10-11 NOTE — Progress Notes (Signed)
History of Present Illness: 66 yo WM admitted to [facility] 01/01/10 with an acute inferior STEMI. Emergent cardiac cath after code STEMI called. He was found to have severe three vessel CAD. He underwent 3V CABG later that day with Dr. Laneta Simmers performing. (LIMA to LAD, SVG to OM, SVG to RCA). He did well in the immediate post-operative period. He was seen in the ED on 01/13/10 after he coughed and felt chest pain. CT scan did not show sternal disunion.    He is here today for follow up. He has had recent diarrhea. He was admitted to [facility] last week for abdominal pain and diarrhea. He was felt to have ischemic colitis based on his CT scan. He was seen by Dr. Juanda Chance with GI and there was suggestion that the colitis could be related to hypotension so Lopressor evening dose was stopped.  No exertional chest pain or SOB at cardiac rehab.  No dizziness or palpitations. Diarrhea is somewhat improved. He plans on f/u with Dr. Elnoria Howard his GI specialist next week.   Primary Care Physician: Pearson Grippe  Last Lipid Profile:  Followed in primary care and well controlled per pt.    Past Medical History  Diagnosis Date  . CAD (coronary artery disease)     s/p 3V CABG 01/01/10 (LIMA to LAD, SVG to OM, SVG to RCA- Dr.Bartle)  . Thoracic aortic aneurysm      stable on CT 01/13/10  . Hyperlipidemia   . Gout   . Asthma   . Blood transfusion   . GERD (gastroesophageal reflux disease)   . Hypertension   . Heart attack     Past Surgical History  Procedure Date  . Coronary artery bypass graft   . Tonsillectomy   . Excision of deep left neck mass. surgeon:  christopher e. newman, m.d.     Current Outpatient Prescriptions  Medication Sig Dispense Refill  . allopurinol (ZYLOPRIM) 300 MG tablet Take 300 mg by mouth daily.      Marland Kitchen aspirin 81 MG tablet Take 81 mg by mouth every evening.       . Cholecalciferol (VITAMIN D PO) Take 1 tablet by mouth daily.      Marland Kitchen dexlansoprazole (DEXILANT) 60 MG capsule  Take 60 mg by mouth daily.       . diphenhydrAMINE (BENADRYL) 12.5 MG/5ML elixir Take 12.5 mg by mouth 4 (four) times daily as needed. As needed for allergies.      Marland Kitchen lisinopril (PRINIVIL,ZESTRIL) 20 MG tablet Take 20 mg by mouth daily.      . metoprolol (LOPRESSOR) 50 MG tablet Take 50 mg by mouth 2 (two) times daily.      . Multiple Vitamin (MULTIVITAMIN) capsule Take 1 capsule by mouth daily.        . simvastatin (ZOCOR) 40 MG tablet Take 40 mg by mouth every evening.      . vitamin C (ASCORBIC ACID) 500 MG tablet Take 500 mg by mouth daily.        . vitamin E 400 UNIT capsule Take 400 Units by mouth daily.          No Known Allergies  History   Social History  . Marital Status: Married    Spouse Name: N/A    Number of Children: N/A  . Years of Education: N/A   Occupational History  . Not on file.   Social History Main Topics  . Smoking status: Former Smoker    Quit date: 02/25/1974  .  Smokeless tobacco: Not on file  . Alcohol Use: 0.6 oz/week    1 Glasses of wine per week     weekly  . Drug Use: No  . Sexually Active: Not on file   Other Topics Concern  . Not on file   Social History Narrative  . No narrative on file    Family History  Problem Relation Age of Onset  . Heart failure Mother   . Pneumonia Father   . Cancer Sister     lung  . Cancer Maternal Grandmother     pancreatic  . Cancer Sister     esophagus    Review of Systems:  As stated in the HPI and otherwise negative.   BP 119/72  Pulse 73  Ht 5\' 10"  (1.778 m)  Wt 198 lb (89.812 kg)  BMI 28.41 kg/m2  Physical Examination: General: Well developed, well nourished, NAD HEENT: OP clear, mucus membranes moist SKIN: warm, dry. No rashes. Neuro: No focal deficits Musculoskeletal: Muscle strength 5/5 all ext Psychiatric: Mood and affect normal Neck: No JVD, no carotid bruits, no thyromegaly, no lymphadenopathy. Lungs:Clear bilaterally, no wheezes, rhonci, crackles Cardiovascular: Regular rate  and rhythm. No murmurs, gallops or rubs. Abdomen:Soft. Bowel sounds present. Non-tender.  Extremities: No lower extremity edema. Pulses are 2 + in the bilateral DP/PT.

## 2011-10-14 ENCOUNTER — Encounter (HOSPITAL_COMMUNITY): Admission: RE | Admit: 2011-10-14 | Discharge: 2011-10-14 | Payer: Self-pay | Source: Ambulatory Visit

## 2011-10-14 NOTE — Progress Notes (Signed)
Nutrition Note Spoke with pt. Pt reports he was diagnosed with ischemic colitis. Pt states he is going to see his GI doctor today due to "diarrhea returned." Pt educated over the phone re: low fiber diet until diarrhea resolves with ischemic colitis. Pt expressed understanding. Continue client-centered nutrition education by RD as part of interdisciplinary care.  Monitor and evaluate progress toward nutrition goal with team.

## 2011-10-15 NOTE — Discharge Summary (Signed)
Physician Discharge Summary  Samuel Coleman AVW:[id number] DOB: Sep 21, 1945 DOA: 10/04/2011  PCP: Pearson Grippe, MD  Admit date: 10/04/2011 Discharge date: 10/15/2011  Recommendations for Outpatient Follow-up:  1. Follow up with Dr hung as recommended.  Discharge Diagnoses:  Principal Problem:  *Ischemic bowel disease Active Problems:  HYPERLIPIDEMIA  GOUT  HYPERTENSION  CAD, ARTERY BYPASS GRAFT  THORACIC AORTIC ANEURYSM  CAD (coronary artery disease)  Carotid bruit  Right inguinal hernia-scrotum  Other and unspecified noninfectious gastroenteritis and colitis   Discharge Condition:stable  Diet recommendation: low sodium diet.  Filed Weights   10/04/11 2024 10/05/11 2027 10/06/11 2211  Weight: 87.4 kg (192 lb 10.9 oz) 108.682 kg (239 lb 9.6 oz) 91.581 kg (201 lb 14.4 oz)    History of present illness:  66 year old gentleman came for worsening back pain since yesterday, feels the pain is similar to the pain he had before MI. He underwent a CT chest and abd with contrst and found to have ischemic colitis. He is admitted to medical service for observation and surgery consulted for further recommendations.    Hospital Course:  1. Ischemic colitis: pt was given bowel rest. Surgery consult called, no further surgical interventions. GI CONSULT called. Marland Kitchen Restarted diet. Pt was able to tolerate regular diet without any symptoms. Plan is to hold metoprolol if SBP < HG.  2. CAD, CABG, : On home medications including, lisinopril, metoprolol.  3. Stable Descending aorta aneurysm  4. Gout: no flare up.  5. Leukocytosis: resolved. No source of infection.  6. Mild thrombocytopenia: not on lovenox, recommend following platelets in one week.    Procedures:  CTabdomen and pelvis.  Consultations:  GI consult  Discharge Exam: Filed Vitals:   10/07/11 0952  BP: 115/92  Pulse: 87  Temp: 98.6 F (37 C)  Resp: 18   Filed Vitals:   10/06/11 1643 10/06/11 2211 10/07/11 0553  10/07/11 0952  BP: 121/68 110/63 136/81 115/92  Pulse: 80 80 70 87  Temp: 98.4 F (36.9 C) 98.3 F (36.8 C) 98.2 F (36.8 C) 98.6 F (37 C)  TempSrc: Oral Oral Oral Oral  Resp: 18 18 20 18   Height:      Weight:  91.581 kg (201 lb 14.4 oz)    SpO2: 97% 98% 97% 98%   General: Alert afebrile comfortable  Cardiovascular: s1s2  Respiratory: CTAB  Abdomen: soft NT ND bs+   Discharge Instructions  Discharge Orders    Future Appointments: Provider: Department: Dept Phone: Center:   10/16/2011 2:45 PM [facility] [phone] None   10/18/2011 2:45 PM [facility] [phone] None   10/21/2011 2:45 PM [facility] [phone] None   10/23/2011 2:45 PM [facility] [phone] None   10/25/2011 2:45 PM [facility] [phone] None   10/28/2011 2:45 PM [facility] [phone] None   10/30/2011 2:45 PM [facility] [phone] None   11/01/2011 2:45 PM [facility] [phone] None   11/04/2011 2:45 PM [facility] [phone] None   11/06/2011 2:45 PM [facility] [phone] None   11/08/2011 2:45 PM [facility] [phone] None   11/11/2011 2:45 PM [facility] [phone] None   11/13/2011 2:45 PM [facility] [phone] None   11/15/2011 2:45 PM [facility] [phone] None   11/18/2011  2:45 PM [facility] [phone] None   11/20/2011 2:45 PM [facility] [phone] None   11/22/2011 2:45 PM [facility] [phone] None    11/25/2011 2:45 PM [facility] [phone] None   11/27/2011 2:45 PM [facility] [phone] None   11/29/2011 2:45 PM [facility] [phone] None   12/02/2011 2:45 PM [facility] [phone] None   12/04/2011 2:45 PM [facility] [phone] None   12/06/2011 2:45 PM [facility] [phone] None   12/09/2011 2:45 PM [facility] [phone] None   12/11/2011 2:45 PM [facility] [phone] None   12/13/2011 2:45 PM [facility] [phone] None   12/16/2011 2:45 PM [facility] [phone] None   12/18/2011 2:45 PM [facility] [phone] None   12/20/2011 2:45 PM [facility] [phone] None   12/23/2011 2:45 PM [facility] [phone] None   12/25/2011 2:45 PM [facility] [phone] None   12/27/2011 2:45 PM [facility] [phone] None   12/30/2011 2:45 PM [facility] [phone] None   01/01/2012 2:45 PM [facility] [phone] None   01/03/2012 2:45 PM [facility] [phone] None   01/06/2012 2:45 PM [facility] [phone] None   01/08/2012 2:45 PM [facility] [phone] None   01/10/2012 2:45 PM [facility] [phone] None   01/13/2012 2:45 PM [facility] [phone] None   01/15/2012 2:45 PM [facility]  [phone] None   01/17/2012 2:45 PM [facility] [phone] None   01/20/2012 2:45 PM [facility] [phone] None   01/22/2012 2:45 PM [facility] [phone] None   01/24/2012 2:45 PM [facility] [phone] None   01/27/2012 2:45 PM [facility] [phone] None   01/29/2012 2:45 PM [facility] [phone] None   01/31/2012 2:45 PM [facility] [phone] None   02/03/2012 2:45 PM [facility] [phone] None   02/05/2012 2:45 PM [facility] [phone] None   02/07/2012 2:45 PM [facility] [phone] None   02/10/2012 2:45 PM [facility] [phone] None   02/12/2012 2:45 PM [facility] [phone] None   02/14/2012 2:45 PM [facility] [phone] None   02/17/2012 2:45 PM [facility] [phone] None   02/19/2012 2:45 PM [facility] [phone] None   02/21/2012 2:45 PM [facility] [phone] None   02/24/2012 2:45 PM [facility] [phone] None   02/26/2012 2:45 PM [facility] [phone] None   02/28/2012 2:45 PM [facility] [phone] None   03/02/2012 2:45 PM [facility] [phone] None   03/04/2012 2:45 PM [facility] [phone] None   03/06/2012 2:45 PM [facility] [phone] None   03/09/2012 2:45 PM [facility] [phone] None   03/11/2012 2:45 PM [facility] [phone] None  03/13/2012 2:45 PM [facility] [phone] None   03/16/2012 2:45 PM [facility] [phone] None   03/18/2012 2:45 PM [facility] [phone] None   03/20/2012 2:45 PM [facility] [phone] None   03/23/2012 2:45 PM [facility] [phone] None   03/25/2012 2:45 PM [facility] [phone] None   03/27/2012 2:45 PM [facility] [phone] None   03/30/2012 2:45 PM [facility] [phone] None   04/01/2012 2:45 PM [facility] [phone] None   04/03/2012 2:45 PM [facility] [phone] None   04/06/2012 2:45 PM [facility] [phone] None   04/08/2012 2:45 PM [facility] [phone] None   04/10/2012 2:45 PM [facility] [phone] None   04/13/2012 2:45 PM [facility] [phone] None   04/15/2012 2:45 PM [facility] [phone] None   04/17/2012 2:45 PM [facility] [phone] None   04/20/2012 2:45 PM [facility] [phone] None   04/22/2012 2:45 PM [facility] [phone] None   04/24/2012 2:45 PM [facility] [phone] None   04/27/2012 2:45 PM [facility] [phone] None   04/29/2012 2:45 PM [facility] [phone] None   05/01/2012 2:45 PM [facility] [phone] None   05/04/2012 2:45 PM [facility] [phone] None   05/06/2012 2:45 PM [facility] [phone] None   05/08/2012 2:45 PM [facility] [phone] None   05/11/2012 2:45 PM [facility] [phone] None   05/13/2012 2:45 PM [facility] [phone] None   05/15/2012 2:45 PM [facility] [phone] None   05/18/2012 2:45 PM [facility] [phone] None   05/20/2012 2:45 PM [facility] [phone] None   05/22/2012 2:45 PM [facility] [phone] None   05/25/2012 2:45 PM [facility] [phone] None     Future Orders Please Complete By Expires   Diet - low sodium heart healthy      Activity as tolerated - No restrictions      Discharge instructions      Comments:   Follow up with PCP and GI as needed. No Hernia repair for 2 months. Please check BP in the evening , if SBP< , PLEASE HOLD BP MEDICATIONS.     Medication List  As of 10/15/2011  6:22 AM   TAKE these medications         allopurinol 300 MG tablet   Commonly known as: ZYLOPRIM   Take 300 mg by mouth daily.      aspirin 81 MG tablet   Take 81 mg by mouth every evening.      dexlansoprazole 60 MG capsule   Commonly known as: DEXILANT   Take 60 mg by mouth daily.      diphenhydrAMINE 12.5 MG/5ML elixir   Commonly known as: BENADRYL   Take 12.5 mg by mouth 4 (four) times daily as needed. As needed for allergies.      lisinopril 20 MG tablet   Commonly known as: PRINIVIL,ZESTRIL   Take 20 mg by mouth daily.  multivitamin capsule   Take 1 capsule by mouth daily.      simvastatin 40 MG tablet   Commonly known as: ZOCOR   Take 40 mg by mouth  every evening.      vitamin C 500 MG tablet   Commonly known as: ASCORBIC ACID   Take 500 mg by mouth daily.      VITAMIN D PO   Take 1 tablet by mouth daily.      vitamin E 400 UNIT capsule   Take 400 Units by mouth daily.           Follow-up Information    Follow up with Pearson Grippe, MD in 2 weeks.   Contact information:   [address] [place] [address] [phone]           The results of significant diagnostics from this hospitalization (including imaging, microbiology, ancillary and laboratory) are listed below for reference.    Significant Diagnostic Studies: Ct Chest W Contrast  10/04/2011  *RADIOLOGY REPORT*  Clinical Data:  Back pain.  Body aches.  Nausea.  CT CHEST, ABDOMEN AND PELVIS WITH CONTRAST  Technique:  Multidetector CT imaging of the chest, abdomen and pelvis was performed following the standard protocol during bolus administration of intravenous contrast.  Contrast: OMNIPAQUE IOHEXOL 300 MG/ML  SOLN  Comparison:  02/15/2011.  CT CHEST  Findings:  Saccular distal aortic arch aneurysm remains present, without acute complicating features.  Dense mural calcification along the medial aspects of the arch.  When measured in a similar fashion as on the prior examination, maximal diameter is 3.9 cm, the same as before.  The CABG.  No axillary adenopathy.  No pericardial or pleural effusion.  Three-vessel aortic arch.  No aggressive osseous lesions.  Old left-sided rib fractures.  Stable 4 mm right upper lobe pulmonary nodule (image 25 series 4). Documented stability back to 07/19/2009 indicating benign etiology. Linear scarring is present in the left lower lobe.  IMPRESSION: 1.  No acute abnormality of the chest. 2.  CABG. 3.  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 4.  Benign 4 mm right upper lobe pulmonary nodule.  CT ABDOMEN AND PELVIS  Findings:  Liver and gallbladder within normal limits.  Spleen normal.  Left  upper polar and right lower polar renal cysts. Nonobstructing bilateral renal calculi.  Largest stone is in the left upper renal pole measuring 7 mm.  Other smaller calculi are present.  The ureters appear within normal limits.  No ureteral calculi.  Urinary bladder normal.  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 Adrenal glands are within normal limits.  Stomach, pancreas, common bile duct within normal limits.  No small bowel obstruction.  No adenopathy.  Normal appendix.  Mild inflammatory changes are present in the right lower quadrant. There is pneumatosis of the proximal ascending colon compatible ischemia with mild pericolonic inflammatory changes.  This is difficult to appreciate on the axial images and is best seen on the coronal images.  There is no portal venous gas.  No intrahepatic gas identified.  Colonic diverticulosis is present distally. Prostate and urinary bladder appear within normal limits.  No free fluid.  No aggressive osseous lesions.   Retroaortic left renal vein is present.  IMPRESSION: 1.  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  Mild inflammatory changes.  No portal venous gas.  No perforation or free air.  The cause of the ischemia is  unclear and the visualized superior mesenteric artery appears patent; embolic infarction/ischemia possible.  The venous system also appears patent.  Mild stranding in the right lower quadrant mesentery. 2.  Nonobstructing renal calculi.  Renal cysts.  Critical Value/emergent results were called by telephone at the time of interpretation on 10/04/2011 at 1342 hours to Dr. Rosalia Hammers, who verbally acknowledged these results.  Original Report Authenticated By: Andreas Newport, M.D.   Ct Abdomen Pelvis W Contrast  10/04/2011  *RADIOLOGY REPORT*  Clinical Data:  Back pain.  Body aches.  Nausea.  CT CHEST, ABDOMEN AND PELVIS WITH CONTRAST  Technique:  Multidetector CT imaging of the  chest, abdomen and pelvis was performed following the standard protocol during bolus administration of intravenous contrast.  Contrast: OMNIPAQUE IOHEXOL 300 MG/ML  SOLN  Comparison:  02/15/2011.  CT CHEST  Findings:  Saccular distal aortic arch aneurysm remains present, without acute complicating features.  Dense mural calcification along the medial aspects of the arch.  When measured in a similar fashion as on the prior examination, maximal diameter is 3.9 cm, the same as before.  The CABG.  No axillary adenopathy.  No pericardial or pleural effusion.  Three-vessel aortic arch.  No aggressive osseous lesions.  Old left-sided rib fractures.  Stable 4 mm right upper lobe pulmonary nodule (image 25 series 4). Documented stability back to 07/19/2009 indicating benign etiology. Linear scarring is present in the left lower lobe.  IMPRESSION: 1.  No acute abnormality of the chest. 2.  CABG. 3.  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 4.  Benign 4 mm right upper lobe pulmonary nodule.  CT ABDOMEN AND PELVIS  Findings:  Liver and gallbladder within normal limits.  Spleen normal.  Left upper polar and right lower polar renal cysts. Nonobstructing bilateral renal calculi.  Largest stone is in the left upper renal pole measuring 7 mm.  Other smaller calculi are present.  The ureters appear within normal limits.  No ureteral calculi.  Urinary bladder normal.  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 Adrenal glands are within normal limits.  Stomach, pancreas, common bile duct within normal limits.  No small bowel obstruction.  No adenopathy.  Normal appendix.  Mild inflammatory changes are present in the right lower quadrant. There is pneumatosis of the proximal ascending colon compatible ischemia with mild pericolonic inflammatory changes.  This is difficult to appreciate on the axial images and is best seen on the coronal images.  There is no  portal venous gas.  No intrahepatic gas identified.  Colonic diverticulosis is present distally. Prostate and urinary bladder appear within normal limits.  No free fluid.  No aggressive osseous lesions.   Retroaortic left renal vein is present.  IMPRESSION: 1.  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  Mild inflammatory changes.  No portal venous gas.  No perforation or free air.  The cause of the ischemia is unclear and the visualized superior mesenteric artery appears patent; embolic infarction/ischemia possible.  The venous system also appears patent.  Mild stranding in the right lower quadrant mesentery. 2.  Nonobstructing renal calculi.  Renal cysts.  Critical Value/emergent results were called by telephone at the time of interpretation on 10/04/2011 at 1342 hours to Dr. Rosalia Hammers, who verbally acknowledged these results.  Original Report Authenticated By: Andreas Newport, M.D.    Microbiology: Recent Results (from the past 240 hour(s))  CLOSTRIDIUM DIFFICILE BY PCR     Status: Normal   Collection  Time   10/05/11  6:45 PM      Component Value Range Status Comment   C difficile by pcr NEGATIVE  NEGATIVE Final      Labs: Basic Metabolic Panel: No results found for this basename: NA:5,K:5,CL:5,CO2:5,GLUCOSE:5,BUN:5,CREATININE:5,CALCIUM:5,MG:5,PHOS:5 in the last 168 hours Liver Function Tests: No results found for this basename: AST:5,ALT:5,ALKPHOS:5,BILITOT:5,PROT:5,ALBUMIN:5 in the last 168 hours No results found for this basename: LIPASE:5,AMYLASE:5 in the last 168 hours No results found for this basename: AMMONIA:5 in the last 168 hours CBC: No results found for this basename: WBC:5,NEUTROABS:5,HGB:5,HCT:5,MCV:5,PLT:5 in the last 168 hours Cardiac Enzymes: No results found for this basename: CKTOTAL:5,CKMB:5,CKMBINDEX:5,TROPONINI:5 in the last 168 hours BNP: BNP (last 3 results) No results found for this basename: PROBNP:3 in the last 8760 hours CBG: No results  found for this basename: GLUCAP:5 in the last 168 hours  Time coordinating discharge: 35 minutes  Signed:  Teran Daughenbaugh  [facility] 10/07/2011, 6:22 AM

## 2011-10-16 ENCOUNTER — Encounter (HOSPITAL_COMMUNITY): Payer: Self-pay

## 2011-10-18 ENCOUNTER — Encounter (HOSPITAL_COMMUNITY): Payer: Self-pay

## 2011-10-21 ENCOUNTER — Encounter (HOSPITAL_COMMUNITY): Payer: Self-pay

## 2011-10-23 ENCOUNTER — Encounter (HOSPITAL_COMMUNITY)
Admission: RE | Admit: 2011-10-23 | Discharge: 2011-10-23 | Disposition: A | Discharge status: Home / Self Care | Payer: Self-pay | Source: Ambulatory Visit | Attending: Cardiovascular Disease | Admitting: Cardiovascular Disease

## 2011-10-25 ENCOUNTER — Encounter (HOSPITAL_COMMUNITY): Payer: Self-pay

## 2011-10-28 ENCOUNTER — Encounter (HOSPITAL_COMMUNITY): Payer: Self-pay

## 2011-10-30 ENCOUNTER — Encounter (HOSPITAL_COMMUNITY): Payer: Self-pay

## 2011-10-30 DIAGNOSIS — I712 Thoracic aortic aneurysm, without rupture, unspecified: Secondary | ICD-10-CM | POA: Insufficient documentation

## 2011-10-30 DIAGNOSIS — Z5189 Encounter for other specified aftercare: Secondary | ICD-10-CM | POA: Insufficient documentation

## 2011-10-30 DIAGNOSIS — I10 Essential (primary) hypertension: Secondary | ICD-10-CM | POA: Insufficient documentation

## 2011-10-30 DIAGNOSIS — E785 Hyperlipidemia, unspecified: Secondary | ICD-10-CM | POA: Insufficient documentation

## 2011-10-30 DIAGNOSIS — K219 Gastro-esophageal reflux disease without esophagitis: Secondary | ICD-10-CM | POA: Insufficient documentation

## 2011-10-30 DIAGNOSIS — I214 Non-ST elevation (NSTEMI) myocardial infarction: Secondary | ICD-10-CM | POA: Insufficient documentation

## 2011-10-30 DIAGNOSIS — Z951 Presence of aortocoronary bypass graft: Secondary | ICD-10-CM | POA: Insufficient documentation

## 2011-10-30 DIAGNOSIS — I2510 Atherosclerotic heart disease of native coronary artery without angina pectoris: Secondary | ICD-10-CM | POA: Insufficient documentation

## 2011-11-01 ENCOUNTER — Encounter (HOSPITAL_COMMUNITY): Payer: Self-pay

## 2011-11-04 ENCOUNTER — Encounter (HOSPITAL_COMMUNITY): Payer: Self-pay

## 2011-11-06 ENCOUNTER — Encounter (HOSPITAL_COMMUNITY): Payer: Self-pay

## 2011-11-08 ENCOUNTER — Encounter (HOSPITAL_COMMUNITY): Payer: Self-pay

## 2011-11-11 ENCOUNTER — Encounter (HOSPITAL_COMMUNITY): Payer: Self-pay

## 2011-11-13 ENCOUNTER — Encounter (HOSPITAL_COMMUNITY)
Admission: RE | Admit: 2011-11-13 | Discharge: 2011-11-13 | Disposition: A | Discharge status: Home / Self Care | Payer: Self-pay | Source: Ambulatory Visit | Attending: Cardiology | Admitting: Cardiology

## 2011-11-15 ENCOUNTER — Encounter (HOSPITAL_COMMUNITY): Payer: Self-pay

## 2011-11-18 ENCOUNTER — Encounter (HOSPITAL_COMMUNITY): Payer: Self-pay

## 2011-11-20 ENCOUNTER — Encounter (HOSPITAL_COMMUNITY): Payer: Self-pay

## 2011-11-22 ENCOUNTER — Encounter (HOSPITAL_COMMUNITY): Payer: Self-pay

## 2011-11-23 ENCOUNTER — Other Ambulatory Visit: Payer: Self-pay | Admitting: Cardiovascular Disease

## 2011-11-25 ENCOUNTER — Encounter (HOSPITAL_COMMUNITY): Payer: Self-pay

## 2011-11-26 ENCOUNTER — Telehealth: Payer: Self-pay | Admitting: Cardiovascular Disease

## 2011-11-26 NOTE — Telephone Encounter (Signed)
Pt rtn someone's call

## 2011-11-26 NOTE — Telephone Encounter (Signed)
Patient called because he said some one with [phone] phone number called and did not leave a message. Pt is aware that according to our records no one has called him from this office. Patient states that possible the call came from the GI office ,pt will call there.

## 2011-11-27 ENCOUNTER — Encounter (HOSPITAL_COMMUNITY): Payer: Self-pay

## 2011-11-27 DIAGNOSIS — I10 Essential (primary) hypertension: Secondary | ICD-10-CM | POA: Insufficient documentation

## 2011-11-27 DIAGNOSIS — E785 Hyperlipidemia, unspecified: Secondary | ICD-10-CM | POA: Insufficient documentation

## 2011-11-27 DIAGNOSIS — I712 Thoracic aortic aneurysm, without rupture, unspecified: Secondary | ICD-10-CM | POA: Insufficient documentation

## 2011-11-27 DIAGNOSIS — I2510 Atherosclerotic heart disease of native coronary artery without angina pectoris: Secondary | ICD-10-CM | POA: Insufficient documentation

## 2011-11-27 DIAGNOSIS — K219 Gastro-esophageal reflux disease without esophagitis: Secondary | ICD-10-CM | POA: Insufficient documentation

## 2011-11-27 DIAGNOSIS — Z5189 Encounter for other specified aftercare: Secondary | ICD-10-CM | POA: Insufficient documentation

## 2011-11-27 DIAGNOSIS — I214 Non-ST elevation (NSTEMI) myocardial infarction: Secondary | ICD-10-CM | POA: Insufficient documentation

## 2011-11-27 DIAGNOSIS — Z951 Presence of aortocoronary bypass graft: Secondary | ICD-10-CM | POA: Insufficient documentation

## 2011-11-28 ENCOUNTER — Other Ambulatory Visit: Payer: Self-pay | Admitting: Cardiovascular Disease

## 2011-11-29 ENCOUNTER — Encounter (HOSPITAL_COMMUNITY): Payer: Self-pay

## 2011-12-02 ENCOUNTER — Encounter (HOSPITAL_COMMUNITY): Payer: Self-pay

## 2011-12-04 ENCOUNTER — Encounter (HOSPITAL_COMMUNITY)
Admission: RE | Admit: 2011-12-04 | Discharge: 2011-12-04 | Disposition: A | Discharge status: Home / Self Care | Payer: Self-pay | Source: Ambulatory Visit | Attending: Cardiovascular Disease | Admitting: Cardiovascular Disease

## 2011-12-06 ENCOUNTER — Encounter (HOSPITAL_COMMUNITY): Payer: Self-pay

## 2011-12-07 ENCOUNTER — Encounter (HOSPITAL_COMMUNITY): Payer: Self-pay | Admitting: *Deleted

## 2011-12-07 ENCOUNTER — Emergency Department (HOSPITAL_COMMUNITY): Payer: Medicare Other

## 2011-12-07 ENCOUNTER — Observation Stay (HOSPITAL_COMMUNITY)
Admission: EM | Admit: 2011-12-07 | Discharge: 2011-12-08 | Disposition: A | Discharge status: Home / Self Care | Payer: Medicare Other | Attending: Internal Medicine | Admitting: Internal Medicine

## 2011-12-07 DIAGNOSIS — I712 Thoracic aortic aneurysm, without rupture, unspecified: Secondary | ICD-10-CM | POA: Insufficient documentation

## 2011-12-07 DIAGNOSIS — R109 Unspecified abdominal pain: Principal | ICD-10-CM

## 2011-12-07 DIAGNOSIS — M109 Gout, unspecified: Secondary | ICD-10-CM | POA: Insufficient documentation

## 2011-12-07 DIAGNOSIS — J45909 Unspecified asthma, uncomplicated: Secondary | ICD-10-CM | POA: Insufficient documentation

## 2011-12-07 DIAGNOSIS — R079 Chest pain, unspecified: Secondary | ICD-10-CM

## 2011-12-07 DIAGNOSIS — K559 Vascular disorder of intestine, unspecified: Secondary | ICD-10-CM

## 2011-12-07 DIAGNOSIS — Z79899 Other long term (current) drug therapy: Secondary | ICD-10-CM | POA: Insufficient documentation

## 2011-12-07 DIAGNOSIS — I25810 Atherosclerosis of coronary artery bypass graft(s) without angina pectoris: Secondary | ICD-10-CM

## 2011-12-07 DIAGNOSIS — I10 Essential (primary) hypertension: Secondary | ICD-10-CM | POA: Insufficient documentation

## 2011-12-07 DIAGNOSIS — I2510 Atherosclerotic heart disease of native coronary artery without angina pectoris: Secondary | ICD-10-CM

## 2011-12-07 DIAGNOSIS — I252 Old myocardial infarction: Secondary | ICD-10-CM | POA: Insufficient documentation

## 2011-12-07 DIAGNOSIS — Z951 Presence of aortocoronary bypass graft: Secondary | ICD-10-CM | POA: Insufficient documentation

## 2011-12-07 DIAGNOSIS — K5289 Other specified noninfective gastroenteritis and colitis: Secondary | ICD-10-CM

## 2011-12-07 DIAGNOSIS — K219 Gastro-esophageal reflux disease without esophagitis: Secondary | ICD-10-CM | POA: Insufficient documentation

## 2011-12-07 DIAGNOSIS — Z7982 Long term (current) use of aspirin: Secondary | ICD-10-CM | POA: Insufficient documentation

## 2011-12-07 LAB — URINALYSIS, MICROSCOPIC ONLY
Bilirubin Urine: NEGATIVE
Hgb urine dipstick: NEGATIVE
Ketones, ur: NEGATIVE mg/dL
Nitrite: NEGATIVE
Specific Gravity, Urine: 1.019 (ref 1.005–1.030)
Urobilinogen, UA: 1 mg/dL (ref 0.0–1.0)

## 2011-12-07 LAB — CREATININE, SERUM
GFR calc Af Amer: >90 mL/min (ref >90)
GFR calc non Af Amer: >90 mL/min (ref >90)

## 2011-12-07 LAB — COMPREHENSIVE METABOLIC PANEL
CO2: 28 mEq/L (ref 19–32)
Calcium: 10.2 mg/dL (ref 8.4–10.5)
Creatinine, Ser: 0.76 mg/dL (ref 0.50–1.35)
GFR calc Af Amer: >90 mL/min (ref >90)
GFR calc non Af Amer: >90 mL/min (ref >90)
Glucose, Bld: 122 mg/dL — ABNORMAL HIGH (ref 70–99)
Total Bilirubin: 0.5 mg/dL (ref 0.3–1.2)

## 2011-12-07 LAB — TROPONIN I: Troponin I: <0.3 ng/mL (ref <0.30)

## 2011-12-07 LAB — CBC
HCT: 47.3 % (ref 39.0–52.0)
HCT: 45 % (ref 39.0–52.0)
Hemoglobin: 16.6 g/dL (ref 13.0–17.0)
MCH: 33.6 pg (ref 26.0–34.0)
MCHC: 35.1 g/dL (ref 30.0–36.0)
MCV: 95.7 fL (ref 78.0–100.0)
Platelets: 173 10*3/uL (ref 150–400)
RBC: 4.69 MIL/uL (ref 4.22–5.81)
RDW: 13.3 % (ref 11.5–15.5)
WBC: 8 10*3/uL (ref 4.0–10.5)

## 2011-12-07 MED ORDER — ASPIRIN EC 81 MG PO TBEC
81.0000 mg | DELAYED_RELEASE_TABLET | Freq: Every day | ORAL | Status: DC
  Administered 2011-12-07 – 2011-12-08 (×2): 81 mg via ORAL
  Filled 2011-12-07 (×2): qty 1

## 2011-12-07 MED ORDER — SODIUM CHLORIDE 0.9 % IV SOLN: INTRAVENOUS | Status: DC

## 2011-12-07 MED ORDER — SIMVASTATIN 40 MG PO TABS
40.0000 mg | ORAL_TABLET | Freq: Every day | ORAL | Status: DC
  Administered 2011-12-07: 40 mg via ORAL
  Filled 2011-12-07 (×3): qty 1

## 2011-12-07 MED ORDER — IOHEXOL 300 MG/ML  SOLN
20.0000 mL | INTRAMUSCULAR | Status: DC
  Administered 2011-12-07: 20 mL via ORAL

## 2011-12-07 MED ORDER — ADULT MULTIVITAMIN W/MINERALS CH
1.0000 | ORAL_TABLET | Freq: Every day | ORAL | Status: DC
  Administered 2011-12-08: 1 via ORAL
  Filled 2011-12-07: qty 1

## 2011-12-07 MED ORDER — ENOXAPARIN SODIUM 30 MG/0.3ML ~~LOC~~ SOLN
30.0000 mg | SUBCUTANEOUS | Status: DC
  Administered 2011-12-07: 30 mg via SUBCUTANEOUS
  Filled 2011-12-07 (×2): qty 0.3

## 2011-12-07 MED ORDER — PANTOPRAZOLE SODIUM 40 MG PO TBEC: 40.0000 mg | DELAYED_RELEASE_TABLET | Freq: Every day | ORAL | Status: DC

## 2011-12-07 MED ORDER — VITAMIN E 180 MG (400 UNIT) PO CAPS
400.0000 [IU] | ORAL_CAPSULE | Freq: Every day | ORAL | Status: DC
  Administered 2011-12-08: 400 [IU] via ORAL
  Filled 2011-12-07: qty 1

## 2011-12-07 MED ORDER — IOHEXOL 300 MG/ML  SOLN
80.0000 mL | Freq: Once | INTRAMUSCULAR | Status: AC | PRN
  Administered 2011-12-07: 80 mL via INTRAVENOUS

## 2011-12-07 MED ORDER — VITAMIN C 500 MG PO TABS
500.0000 mg | ORAL_TABLET | Freq: Every day | ORAL | Status: DC
  Administered 2011-12-08: 500 mg via ORAL
  Filled 2011-12-07: qty 1

## 2011-12-07 MED ORDER — SODIUM CHLORIDE 0.9 % IJ SOLN: 3.0000 mL | Freq: Two times a day (BID) | INTRAMUSCULAR | Status: DC

## 2011-12-07 MED ORDER — LISINOPRIL 20 MG PO TABS
20.0000 mg | ORAL_TABLET | Freq: Every day | ORAL | Status: DC
  Administered 2011-12-08: 20 mg via ORAL
  Filled 2011-12-07: qty 1

## 2011-12-07 MED ORDER — ALLOPURINOL 300 MG PO TABS
300.0000 mg | ORAL_TABLET | Freq: Every day | ORAL | Status: DC
  Administered 2011-12-07 – 2011-12-08 (×2): 300 mg via ORAL
  Filled 2011-12-07 (×2): qty 1

## 2011-12-07 MED ORDER — ONDANSETRON HCL 4 MG/2ML IJ SOLN: 4.0000 mg | Freq: Three times a day (TID) | INTRAMUSCULAR | Status: AC | PRN

## 2011-12-07 MED ORDER — METOPROLOL TARTRATE 25 MG PO TABS
25.0000 mg | ORAL_TABLET | Freq: Two times a day (BID) | ORAL | Status: DC
  Administered 2011-12-07 – 2011-12-08 (×2): 25 mg via ORAL
  Filled 2011-12-07 (×4): qty 1

## 2011-12-07 NOTE — ED Notes (Signed)
Patient reports he had onset of lower abd pain this morning.  He then developed onset of diaphoresis and left chest pain.  Patient has hx of mi and states he had diaphoresis like this with his mi.  Patient denies sob.  Patient has had nausea,  Denies diarrhea

## 2011-12-07 NOTE — ED Notes (Signed)
Patient transported to CT 

## 2011-12-07 NOTE — H&P (Signed)
[facility]          History and Physical    PCP:   Pearson Grippe, MD   Chief Complaint:  Abdominal pain/chest pain  HPI: Mr.Cibrian is a 66/M with h/o CABG, Ischemic colitis, who's had recurrent bouts of mild abdominal pain since his episode of colitis back in august, reportedly just started eating red meats in the last 2-3 days. He woke up this morning with severe abdominal pain which was bilateral sharp, associated with profuse diaphoresis, she subsequently felt the pain move up to the left side of his chest which made him concerned due to his h/o MI and presented to the ER. In the ER he had a CT chest/abd/pelvis which was unremarkable, he subsequently had a bowel movement which was semisolid, without any blood and feels much better. EDP was adamant about admission for r/o ACS due to his history of CAD/CABG  Allergies:  No Known Allergies    Past Medical History  Diagnosis Date  . CAD (coronary artery disease)     s/p 3V CABG 01/01/10 (LIMA to LAD, SVG to OM, SVG to RCA- Dr.Bartle)  . Thoracic aortic aneurysm      stable on CT 01/13/10  . Hyperlipidemia   . Gout   . Asthma   . Blood transfusion   . GERD (gastroesophageal reflux disease)   . Hypertension   . Heart attack     Past Surgical History  Procedure Date  . Coronary artery bypass graft   . Tonsillectomy   . Excision of deep left neck mass. surgeon:  christopher e. newman, m.d.     Prior to Admission medications   Medication Sig Start Date End Date Taking? Authorizing Provider  allopurinol (ZYLOPRIM) 300 MG tablet Take 300 mg by mouth daily.   Yes Historical Provider, MD  aspirin EC 81 MG tablet Take 81 mg by mouth daily.   Yes Historical Provider, MD  Cholecalciferol (VITAMIN D PO) Take 1 tablet by mouth daily.   Yes Historical Provider, MD  dexlansoprazole (DEXILANT) 60 MG capsule Take 60 mg by mouth daily.    Yes Historical Provider, MD  diphenhydrAMINE (BENADRYL) 12.5 MG/5ML elixir Take  12.5 mg by mouth 4 (four) times daily as needed. As needed for allergies.   Yes Historical Provider, MD  lisinopril (PRINIVIL,ZESTRIL) 20 MG tablet Take 20 mg by mouth daily.   Yes Historical Provider, MD  metoprolol tartrate (LOPRESSOR) 25 MG tablet Take 25 mg by mouth 2 (two) times daily.   Yes Historical Provider, MD  Multiple Vitamin (MULTIVITAMIN WITH MINERALS) TABS Take 1 tablet by mouth daily.   Yes Historical Provider, MD  simvastatin (ZOCOR) 40 MG tablet Take 40 mg by mouth daily.    Yes Historical Provider, MD  vitamin C (ASCORBIC ACID) 500 MG tablet Take 500 mg by mouth daily.     Yes Historical Provider, MD  vitamin E 400 UNIT capsule Take 400 Units by mouth daily.     Yes Historical Provider, MD    Social History:  reports that he quit smoking about 37 years ago. He does not have any smokeless tobacco history on file. He reports that he drinks about .6 ounces of alcohol per week. He reports that he does not use illicit drugs.  Family History  Problem Relation Age of Onset  . Heart failure Mother   . Pneumonia Father   . Cancer Sister     lung  . Cancer Maternal Grandmother     pancreatic  .  Cancer Sister     esophagus    Review of Systems:  Constitutional: Denies fever, chills, diaphoresis, appetite change and fatigue.  HEENT: Denies photophobia, eye pain, redness, hearing loss, ear pain, congestion, sore throat, rhinorrhea, sneezing, mouth sores, trouble swallowing, neck pain, neck stiffness and tinnitus.   Respiratory: Denies SOB, DOE, cough, chest tightness,  and wheezing.   Cardiovascular: Denies chest pain, palpitations and leg swelling.  Gastrointestinal: Denies nausea, vomiting, abdominal pain, diarrhea, constipation, blood in stool and abdominal distention.  Genitourinary: Denies dysuria, urgency, frequency, hematuria, flank pain and difficulty urinating.  Musculoskeletal: Denies myalgias, back pain, joint swelling, arthralgias and gait problem.  Skin: Denies  pallor, rash and wound.  Neurological: Denies dizziness, seizures, syncope, weakness, light-headedness, numbness and headaches.  Hematological: Denies adenopathy. Easy bruising, personal or family bleeding history  Psychiatric/Behavioral: Denies suicidal ideation, mood changes, confusion, nervousness, sleep disturbance and agitation   Physical Exam: Blood pressure 150/73, pulse 64, temperature 98.1 F (36.7 C), temperature source Oral, resp. rate 16, height 5\' 10"  (1.778 m), weight 81.647 kg (180 lb), SpO2 98.00%. Gen: AAOx3, no distress HEENT: PERRLA, EOMI, no JVD CVS: S1S2/RRR,  No m/r/g Lungs: CTAB Abd: soft, mild lower quadrant tenderness, bS present, no CVA tenderness Ext: no edema/c/c Neuro: moves all ext Skin: no rashes or skin breakdown  Labs on Admission:  Results for orders placed during the hospital encounter of 12/07/11 (from the past 48 hour(s))  CBC     Status: Normal   Collection Time   12/07/11  7:28 AM      Component Value Range Comment   WBC 7.9  4.0 - 10.5 K/uL    RBC 4.94  4.22 - 5.81 MIL/uL    Hemoglobin 16.6  13.0 - 17.0 g/dL    HCT 21.3  08.6 - 57.8 %    MCV 95.7  78.0 - 100.0 fL    MCH 33.6  26.0 - 34.0 pg    MCHC 35.1  30.0 - 36.0 g/dL    RDW 46.9  62.9 - 52.8 %    Platelets 168  150 - 400 K/uL   LIPASE, BLOOD     Status: Normal   Collection Time   12/07/11  7:45 AM      Component Value Range Comment   Lipase 19  11 - 59 U/L   COMPREHENSIVE METABOLIC PANEL     Status: Abnormal   Collection Time   12/07/11  7:45 AM      Component Value Range Comment   Sodium 142  135 - 145 mEq/L    Potassium 3.8  3.5 - 5.1 mEq/L    Chloride 104  96 - 112 mEq/L    CO2 28  19 - 32 mEq/L    Glucose, Bld 122 (*) 70 - 99 mg/dL    BUN 18  6 - 23 mg/dL    Creatinine, Ser 4.13  0.50 - 1.35 mg/dL    Calcium 24.4  8.4 - 10.5 mg/dL    Total Protein 6.9  6.0 - 8.3 g/dL    Albumin 3.9  3.5 - 5.2 g/dL    AST 23  0 - 37 U/L    ALT 25  0 - 53 U/L    Alkaline Phosphatase  67  39 - 117 U/L    Total Bilirubin 0.5  0.3 - 1.2 mg/dL    GFR calc non Af Amer >90  >90 mL/min    GFR calc Af Amer >90  >90 mL/min   TROPONIN  I     Status: Normal   Collection Time   12/07/11  7:45 AM      Component Value Range Comment   Troponin I <0.30  <0.30 ng/mL   URINALYSIS, MICROSCOPIC ONLY     Status: Abnormal   Collection Time   12/07/11  8:51 AM      Component Value Range Comment   Color, Urine YELLOW  YELLOW    APPearance CLOUDY (*) CLEAR    Specific Gravity, Urine 1.019  1.005 - 1.030    pH 5.5  5.0 - 8.0    Glucose, UA NEGATIVE  NEGATIVE mg/dL    Hgb urine dipstick NEGATIVE  NEGATIVE    Bilirubin Urine NEGATIVE  NEGATIVE    Ketones, ur NEGATIVE  NEGATIVE mg/dL    Protein, ur NEGATIVE  NEGATIVE mg/dL    Urobilinogen, UA 1.0  0.0 - 1.0 mg/dL    Nitrite NEGATIVE  NEGATIVE    Leukocytes, UA NEGATIVE  NEGATIVE    Squamous Epithelial / LPF RARE  RARE    Urine-Other MUCOUS PRESENT       Radiological Exams on Admission: Ct Chest W Contrast  12/07/2011  *RADIOLOGY REPORT*  Clinical Data:  History of thoracic aortic aneurysm.  Mid lower abdominal pain.  Chest pain and diaphoresis.  Nausea.  CT CHEST, ABDOMEN AND PELVIS WITH CONTRAST  Technique:  Multidetector CT imaging of the chest, abdomen and pelvis was performed following the standard protocol during bolus administration of intravenous contrast.  Contrast: 80mL OMNIPAQUE IOHEXOL 300 MG/ML  SOLN  Comparison:  CT of the chest abdomen and pelvis 10/04/2011.  CT CHEST  Findings:  Mediastinum: A 3.9 x 3.6 cm focal aneurysmal dilatation of the proximal descending thoracic aorta is unchanged.  No evidence of thoracic aortic dissection at this time. There is atherosclerosis of the thoracic aorta, the great vessels of the mediastinum and the coronary arteries, including calcified atherosclerotic plaque in the the left main, left anterior descending, left circumflex and right coronary arteries. The patient is status post median  sternotomy for CABG with a LIMA to the LAD.  Saphenous vein grafts are also noted extending to the obtuse marginal and distal RCA circulations. No pathologically enlarged mediastinal or hilar lymph nodes. Esophagus is unremarkable in appearance.  Lungs/Pleura: 4 mm nodule in the periphery of the right upper lobe (image 30 of series 3) is unchanged. Likewise several 2-3 mm pulmonary nodules in the posterolateral aspect of the left lower lobe (images 34 - 36 of series 3) are unchanged.  No other larger more suspicious appearing pulmonary nodules or masses are otherwise noted. No acute consolidative airspace disease.  No pleural effusions.  Linear scarring in the lateral aspect of the left lower lobe is unchanged.  Musculoskeletal: Median sternotomy wires.  Multiple old healed rib fractures are noted bilaterally. There are no aggressive appearing lytic or blastic lesions noted in the visualized portions of the skeleton.  IMPRESSION:  1. 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 2. Atherosclerosis, including left main and three-vessel coronary artery disease.  Status post median sternotomy for CABG, as above. 3.  No change and multiple tiny 2-4 mm pulmonary nodules, as above, when compared to remote prior study 07/19/2009.  At this point, these can be considered radiographically benign and require no specific imaging follow-up.  CT ABDOMEN AND PELVIS  Findings:  Abdomen/Pelvis: The appearance of the liver, gallbladder, pancreas, spleen and bilateral adrenal glands is unremarkable.  There are numerous nonobstructive calculi within the collecting systems of the kidneys bilaterally, the largest of which measure up to 6 mm in the upper pole of the left renal collecting system.  Two small cysts are present in the right kidney, largest of which is in the medial aspect of the interpolar right kidney measuring 1.7 cm in diameter.  Atherosclerosis of the abdominal and pelvic  vasculature, without definite aneurysm or dissection.  There are a few scattered colonic diverticula, without definite surrounding inflammatory changes to suggest an acute diverticulitis at this time.  The appendix is normal.  No ascites or pneumoperitoneum and no pathologic distension of bowel.  No definite pathologic lymphadenopathy identified within the abdomen or pelvis.  Prostate and urinary bladder are unremarkable in appearance.  Musculoskeletal: Old healed fracture of the left inferior pubic ramus. There are no aggressive appearing lytic or blastic lesions noted in the visualized portions of the skeleton.  IMPRESSION:  1. 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  No ureteral stones or findings to suggest urinary tract obstruction at this time. 2.  No other acute findings in the abdomen or pelvis to account for the patient's symptoms. 3.  Mild colonic diverticulosis without findings to suggest acute diverticulitis at this time. 4.  Normal appendix.   Original Report Authenticated By: Florencia Reasons, M.D.    Ct Abdomen Pelvis W Contrast  12/07/2011  *RADIOLOGY REPORT*  Clinical Data:  History of thoracic aortic aneurysm.  Mid lower abdominal pain.  Chest pain and diaphoresis.  Nausea.  CT CHEST, ABDOMEN AND PELVIS WITH CONTRAST  Technique:  Multidetector CT imaging of the chest, abdomen and pelvis was performed following the standard protocol during bolus administration of intravenous contrast.  Contrast: 80mL OMNIPAQUE IOHEXOL 300 MG/ML  SOLN  Comparison:  CT of the chest abdomen and pelvis 10/04/2011.  CT CHEST  Findings:  Mediastinum: A 3.9 x 3.6 cm focal aneurysmal dilatation of the proximal descending thoracic aorta is unchanged.  No evidence of thoracic aortic dissection at this time. There is atherosclerosis of the thoracic aorta, the great vessels of the mediastinum and the  coronary arteries, including calcified atherosclerotic plaque in the the left main, left anterior descending, left circumflex and right coronary arteries. The patient is status post median sternotomy for CABG with a LIMA to the LAD.  Saphenous vein grafts are also noted extending to the obtuse marginal and distal RCA circulations. No pathologically enlarged mediastinal or hilar lymph nodes. Esophagus is unremarkable in appearance.  Lungs/Pleura: 4 mm nodule in the periphery of the right upper lobe (image 30 of series 3) is unchanged. Likewise several 2-3 mm pulmonary nodules in the posterolateral aspect of the left lower lobe (images 34 - 36 of series 3) are unchanged.  No other larger more suspicious appearing pulmonary nodules or masses are otherwise noted. No acute consolidative airspace disease.  No pleural effusions.  Linear scarring in the lateral aspect of the left lower lobe is unchanged.  Musculoskeletal: Median sternotomy wires.  Multiple old healed rib fractures are noted bilaterally. There are no aggressive appearing lytic or blastic lesions noted in the visualized portions of the skeleton.  IMPRESSION:  1. 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 2. Atherosclerosis, including left main and three-vessel coronary artery disease.  Status post median sternotomy for CABG, as above. 3.  No  change and multiple tiny 2-4 mm pulmonary nodules, as above, when compared to remote prior study 07/19/2009.  At this point, these can be considered radiographically benign and require no specific imaging follow-up.  CT ABDOMEN AND PELVIS  Findings:  Abdomen/Pelvis: The appearance of the liver, gallbladder, pancreas, spleen and bilateral adrenal glands is unremarkable.  There are numerous nonobstructive calculi within the collecting systems of the kidneys bilaterally, the largest of which measure up to 6 mm in the upper pole of the left renal collecting system.  Two small  cysts are present in the right kidney, largest of which is in the medial aspect of the interpolar right kidney measuring 1.7 cm in diameter.  Atherosclerosis of the abdominal and pelvic vasculature, without definite aneurysm or dissection.  There are a few scattered colonic diverticula, without definite surrounding inflammatory changes to suggest an acute diverticulitis at this time.  The appendix is normal.  No ascites or pneumoperitoneum and no pathologic distension of bowel.  No definite pathologic lymphadenopathy identified within the abdomen or pelvis.  Prostate and urinary bladder are unremarkable in appearance.  Musculoskeletal: Old healed fracture of the left inferior pubic ramus. There are no aggressive appearing lytic or blastic lesions noted in the visualized portions of the skeleton.  IMPRESSION:  1. 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  No ureteral stones or findings to suggest urinary tract obstruction at this time. 2.  No other acute findings in the abdomen or pelvis to account for the patient's symptoms. 3.  Mild colonic diverticulosis without findings to suggest acute diverticulitis at this time. 4.  Normal appendix.   Original Report Authenticated By: Florencia Reasons, M.D.    Dg Chest Port 1 View  12/07/2011  *RADIOLOGY REPORT*  Clinical Data: Chest pain  PORTABLE CHEST - 1 VIEW  Comparison: 01/30/2010  Findings: Mild left-sided cardiac silhouette enlargement stable. Vascular pattern normal.  Lungs clear.  IMPRESSION:  No change.  No acute findings.   Original Report Authenticated By: Otilio Carpen, M.D.     Assessment/Plan   1. Abdominal pain: triggered by recent change in diet and improved after BM CT/Exam unremarkable,  Bland diet, observe FU with Dr.Hung  2. CAD/CABG: EKG unchanged Will cycle enzymes and observe on tele Likelihood of cardiac etiology  extremely low Continue ASA/Metoprolol  3.  THORACIC AORTIC ANEURYSM Stable base on CT, FU with CVTS  4. DVt proph: lovenox  Dispo: home tomorrow am if remains stable    Time Spent on Admission:  Sargent Mankey [facility] Pager: [phone] 12/07/2011, 12:09 PM

## 2011-12-07 NOTE — ED Notes (Signed)
Attempt to call report, no one available at this time to take. Will call back in 10 mins.

## 2011-12-07 NOTE — ED Notes (Signed)
CT notified pt finished with contrast. 

## 2011-12-07 NOTE — ED Notes (Signed)
Pt reporting this morning woke up to take shower, developed lower abdominal pain, sharp. Pain also at left lower rib. Reports diaphoresis. No n/v. Hx of ischemic bowel.

## 2011-12-07 NOTE — ED Notes (Signed)
Admitting MD at bedside.

## 2011-12-07 NOTE — ED Provider Notes (Signed)
History     CSN: [id number]  Arrival date & time 12/07/11  0708   First MD Initiated Contact with Patient 12/07/11 [phone]      Chief Complaint  Patient presents with  . Chest Pain  . Abdominal Pain      HPI Pt was seen at 0735.  Per pt, c/o gradual onset and persistence of constant lower abd "pain" that began this morning when he woke up approx 0500 PTA.  Pt describes the pain as "burning" and "cramping."  States the pain radiated into his left upper abd area. Pt states he then developed left sided chest "pain," associated with diaphoresis and nausea.  Describes his symptoms as "it felt like when I had the heart attack last time but worse."  Pt states his CP has improved by his arrival to the ED. Denies back pain, no testicular pain/swelling, no cough, no palpitations, no dysuria/hematuria.    Past Medical History  Diagnosis Date  . CAD (coronary artery disease)     s/p 3V CABG 01/01/10 (LIMA to LAD, SVG to OM, SVG to RCA- Dr.Bartle)  . Thoracic aortic aneurysm      stable on CT 01/13/10  . Hyperlipidemia   . Gout   . Asthma   . Blood transfusion   . GERD (gastroesophageal reflux disease)   . Hypertension   . Heart attack     Past Surgical History  Procedure Date  . Coronary artery bypass graft   . Tonsillectomy   . Excision of deep left neck mass. surgeon:  christopher e. newman, m.d.     Family History  Problem Relation Age of Onset  . Heart failure Mother   . Pneumonia Father   . Cancer Sister     lung  . Cancer Maternal Grandmother     pancreatic  . Cancer Sister     esophagus    History  Substance Use Topics  . Smoking status: Former Smoker    Quit date: 02/25/1974  . Smokeless tobacco: Not on file  . Alcohol Use: 0.6 oz/week    1 Glasses of wine per week     weekly      Review of Systems ROS: Statement: All systems negative except as marked or noted in the HPI; Constitutional: Negative for fever and chills. ; ; Eyes: Negative for eye pain,  redness and discharge. ; ; ENMT: Negative for ear pain, hoarseness, nasal congestion, sinus pressure and sore throat. ; ; Cardiovascular: +CP, SOB. Negative for palpitations, dyspnea and peripheral edema. ; ; Respiratory: Negative for cough, wheezing and stridor. ; ; Gastrointestinal: +abd pain, nausea. Negative for vomiting, diarrhea, blood in stool, hematemesis, jaundice and rectal bleeding. . ; ; Genitourinary: Negative for dysuria, flank pain and hematuria. ;; Genital:  No penile drainage or rash, no testicular pain or swelling, no scrotal rash or swelling. ;; Musculoskeletal: Negative for back pain and neck pain. Negative for swelling and trauma.; ; Skin: Negative for pruritus, rash, abrasions, blisters, bruising and skin lesion.; ; Neuro: Negative for headache, lightheadedness and neck stiffness. Negative for weakness, altered level of consciousness , altered mental status, extremity weakness, paresthesias, involuntary movement, seizure and syncope.       Allergies  Review of patient's allergies indicates no known allergies.  Home Medications   Current Outpatient Rx  Name Route Sig Dispense Refill  . ALLOPURINOL 300 MG PO TABS Oral Take 300 mg by mouth daily.    . ASPIRIN 81 MG PO TABS Oral Take 81 mg  by mouth every evening.     Marland Kitchen VITAMIN D PO Oral Take 1 tablet by mouth daily.    . DEXLANSOPRAZOLE 60 MG PO CPDR Oral Take 60 mg by mouth daily.     Marland Kitchen DIPHENHYDRAMINE HCL 12.5 MG/5ML PO ELIX Oral Take 12.5 mg by mouth 4 (four) times daily as needed. As needed for allergies.    Marland Kitchen LISINOPRIL 20 MG PO TABS Oral Take 20 mg by mouth daily.    Marland Kitchen LISINOPRIL 20 MG PO TABS  TAKE 1 TABLET BY MOUTH EVERY DAY 30 tablet 8  . METOPROLOL TARTRATE 25 MG PO TABS Oral Take 1 tablet (25 mg total) by mouth 2 (two) times daily. 60 tablet 6  . METOPROLOL TARTRATE 50 MG PO TABS  TAKE 1 TABLET BY MOUTH TWICE A DAY 60 tablet 10  . MULTIVITAMINS PO CAPS Oral Take 1 capsule by mouth daily.      Marland Kitchen SIMVASTATIN 40 MG PO  TABS Oral Take 40 mg by mouth every evening.    Marland Kitchen VITAMIN C 500 MG PO TABS Oral Take 500 mg by mouth daily.      Marland Kitchen VITAMIN E 400 UNITS PO CAPS Oral Take 400 Units by mouth daily.        BP 167/85  Pulse 61  Temp 97.7 F (36.5 C) (Oral)  Resp 20  Ht 5\' 10"  (1.778 m)  Wt 180 lb (81.647 kg)  BMI 25.83 kg/m2  SpO2 98%  Physical Exam 0740: Physical examination:  Nursing notes reviewed; Vital signs and O2 SAT reviewed;  Constitutional: Well developed, Well nourished, Well hydrated, In no acute distress; Head:  Normocephalic, atraumatic; Eyes: EOMI, PERRL, No scleral icterus; ENMT: Mouth and pharynx normal, Mucous membranes moist; Neck: Supple, Full range of motion, No lymphadenopathy; Cardiovascular: Regular rate and rhythm, No gallop; Respiratory: Breath sounds clear & equal bilaterally, No rales, rhonchi, wheezes.  Speaking full sentences with ease, Normal respiratory effort/excursion; Chest: Nontender, Movement normal; Abdomen: Soft, +RLQ and LLQ tenderness to palp. No rebound or guarding. No palp hernia/masses.  Nondistended, Normal bowel sounds; Extremities: Pulses normal, No tenderness, No edema, No calf edema or asymmetry.; Neuro: AA&Ox3, Major CN grossly intact.  Speech clear. No gross focal motor or sensory deficits in extremities.; Skin: Color normal, Warm, Dry.   ED Course  Procedures    MDM  MDM Reviewed: nursing note, previous chart and vitals Reviewed previous: labs, ECG and CT scan Interpretation: labs, ECG, x-ray and CT scan      Date: 12/07/2011  Rate: 67  Rhythm: normal sinus rhythm  QRS Axis: right  Intervals: normal  ST/T Wave abnormalities: normal  Conduction Disutrbances:left posterior fascicular block  Narrative Interpretation:   Old EKG Reviewed: unchanged; no significant changes from previous EKG dated 10/04/2011.   Results for orders placed during the hospital encounter of 12/07/11  CBC      Component Value Range   WBC 7.9  4.0 - 10.5 K/uL   RBC 4.94   4.22 - 5.81 MIL/uL   Hemoglobin 16.6  13.0 - 17.0 g/dL   HCT 98.1  19.1 - 47.8 %   MCV 95.7  78.0 - 100.0 fL   MCH 33.6  26.0 - 34.0 pg   MCHC 35.1  30.0 - 36.0 g/dL   RDW 29.5  62.1 - 30.8 %   Platelets 168  150 - 400 K/uL  LIPASE, BLOOD      Component Value Range   Lipase 19  11 - 59 U/L  COMPREHENSIVE  METABOLIC PANEL      Component Value Range   Sodium 142  135 - 145 mEq/L   Potassium 3.8  3.5 - 5.1 mEq/L   Chloride 104  96 - 112 mEq/L   CO2 28  19 - 32 mEq/L   Glucose, Bld 122 (*) 70 - 99 mg/dL   BUN 18  6 - 23 mg/dL   Creatinine, Ser 1.61  0.50 - 1.35 mg/dL   Calcium 09.6  8.4 - 04.5 mg/dL   Total Protein 6.9  6.0 - 8.3 g/dL   Albumin 3.9  3.5 - 5.2 g/dL   AST 23  0 - 37 U/L   ALT 25  0 - 53 U/L   Alkaline Phosphatase 67  39 - 117 U/L   Total Bilirubin 0.5  0.3 - 1.2 mg/dL   GFR calc non Af Amer >90  >90 mL/min   GFR calc Af Amer >90  >90 mL/min  TROPONIN I      Component Value Range   Troponin I <0.30  <0.30 ng/mL  URINALYSIS, MICROSCOPIC ONLY      Component Value Range   Color, Urine YELLOW  YELLOW   APPearance CLOUDY (*) CLEAR   Specific Gravity, Urine 1.019  1.005 - 1.030   pH 5.5  5.0 - 8.0   Glucose, UA NEGATIVE  NEGATIVE mg/dL   Hgb urine dipstick NEGATIVE  NEGATIVE   Bilirubin Urine NEGATIVE  NEGATIVE   Ketones, ur NEGATIVE  NEGATIVE mg/dL   Protein, ur NEGATIVE  NEGATIVE mg/dL   Urobilinogen, UA 1.0  0.0 - 1.0 mg/dL   Nitrite NEGATIVE  NEGATIVE   Leukocytes, UA NEGATIVE  NEGATIVE   Squamous Epithelial / LPF RARE  RARE   Urine-Other MUCOUS PRESENT     Ct Chest W Contrast 12/07/2011  *RADIOLOGY REPORT*  Clinical Data:  History of thoracic aortic aneurysm.  Mid lower abdominal pain.  Chest pain and diaphoresis.  Nausea.  CT CHEST, ABDOMEN AND PELVIS WITH CONTRAST  Technique:  Multidetector CT imaging of the chest, abdomen and pelvis was performed following the standard protocol during bolus administration of intravenous contrast.  Contrast: 80mL OMNIPAQUE  IOHEXOL 300 MG/ML  SOLN  Comparison:  CT of the chest abdomen and pelvis 10/04/2011.  CT CHEST  Findings:  Mediastinum: A 3.9 x 3.6 cm focal aneurysmal dilatation of the proximal descending thoracic aorta is unchanged.  No evidence of thoracic aortic dissection at this time. There is atherosclerosis of the thoracic aorta, the great vessels of the mediastinum and the coronary arteries, including calcified atherosclerotic plaque in the the left main, left anterior descending, left circumflex and right coronary arteries. The patient is status post median sternotomy for CABG with a LIMA to the LAD.  Saphenous vein grafts are also noted extending to the obtuse marginal and distal RCA circulations. No pathologically enlarged mediastinal or hilar lymph nodes. Esophagus is unremarkable in appearance.  Lungs/Pleura: 4 mm nodule in the periphery of the right upper lobe (image 30 of series 3) is unchanged. Likewise several 2-3 mm pulmonary nodules in the posterolateral aspect of the left lower lobe (images 34 - 36 of series 3) are unchanged.  No other larger more suspicious appearing pulmonary nodules or masses are otherwise noted. No acute consolidative airspace disease.  No pleural effusions.  Linear scarring in the lateral aspect of the left lower lobe is unchanged.  Musculoskeletal: Median sternotomy wires.  Multiple old healed rib fractures are noted bilaterally. There are no aggressive appearing lytic or  blastic lesions noted in the visualized portions of the skeleton.  IMPRESSION:  1. 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 2. Atherosclerosis, including left main and three-vessel coronary artery disease.  Status post median sternotomy for CABG, as above. 3.  No change and multiple tiny 2-4 mm pulmonary nodules, as above, when compared to remote prior study 07/19/2009.  At this point, these can be considered radiographically benign and require no specific imaging  follow-up.  CT ABDOMEN AND PELVIS  Findings:  Abdomen/Pelvis: The appearance of the liver, gallbladder, pancreas, spleen and bilateral adrenal glands is unremarkable.  There are numerous nonobstructive calculi within the collecting systems of the kidneys bilaterally, the largest of which measure up to 6 mm in the upper pole of the left renal collecting system.  Two small cysts are present in the right kidney, largest of which is in the medial aspect of the interpolar right kidney measuring 1.7 cm in diameter.  Atherosclerosis of the abdominal and pelvic vasculature, without definite aneurysm or dissection.  There are a few scattered colonic diverticula, without definite surrounding inflammatory changes to suggest an acute diverticulitis at this time.  The appendix is normal.  No ascites or pneumoperitoneum and no pathologic distension of bowel.  No definite pathologic lymphadenopathy identified within the abdomen or pelvis.  Prostate and urinary bladder are unremarkable in appearance.  Musculoskeletal: Old healed fracture of the left inferior pubic ramus. There are no aggressive appearing lytic or blastic lesions noted in the visualized portions of the skeleton.  IMPRESSION:  1. 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  No ureteral stones or findings to suggest urinary tract obstruction at this time. 2.  No other acute findings in the abdomen or pelvis to account for the patient's symptoms. 3.  Mild colonic diverticulosis without findings to suggest acute diverticulitis at this time. 4.  Normal appendix.   Original Report Authenticated By: Florencia Reasons, M.D.    Ct Abdomen Pelvis W Contrast 12/07/2011  *RADIOLOGY REPORT*  Clinical Data:  History of thoracic aortic aneurysm.  Mid lower abdominal pain.  Chest pain and diaphoresis.  Nausea.  CT CHEST, ABDOMEN AND PELVIS WITH CONTRAST   Technique:  Multidetector CT imaging of the chest, abdomen and pelvis was performed following the standard protocol during bolus administration of intravenous contrast.  Contrast: 80mL OMNIPAQUE IOHEXOL 300 MG/ML  SOLN  Comparison:  CT of the chest abdomen and pelvis 10/04/2011.  CT CHEST  Findings:  Mediastinum: A 3.9 x 3.6 cm focal aneurysmal dilatation of the proximal descending thoracic aorta is unchanged.  No evidence of thoracic aortic dissection at this time. There is atherosclerosis of the thoracic aorta, the great vessels of the mediastinum and the coronary arteries, including calcified atherosclerotic plaque in the the left main, left anterior descending, left circumflex and right coronary arteries. The patient is status post median sternotomy for CABG with a LIMA to the LAD.  Saphenous vein grafts are also noted extending to the obtuse marginal and distal RCA circulations. No pathologically enlarged mediastinal or hilar lymph nodes. Esophagus is unremarkable in appearance.  Lungs/Pleura: 4 mm nodule in the periphery of the right upper lobe (image 30 of series 3) is unchanged. Likewise several 2-3 mm pulmonary nodules in the posterolateral aspect of the left lower lobe (images 34 - 36 of series 3) are unchanged.  No  other larger more suspicious appearing pulmonary nodules or masses are otherwise noted. No acute consolidative airspace disease.  No pleural effusions.  Linear scarring in the lateral aspect of the left lower lobe is unchanged.  Musculoskeletal: Median sternotomy wires.  Multiple old healed rib fractures are noted bilaterally. There are no aggressive appearing lytic or blastic lesions noted in the visualized portions of the skeleton.  IMPRESSION:  1. 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 2. Atherosclerosis, including left main and three-vessel coronary artery disease.  Status post median sternotomy for CABG, as above. 3.  No change and  multiple tiny 2-4 mm pulmonary nodules, as above, when compared to remote prior study 07/19/2009.  At this point, these can be considered radiographically benign and require no specific imaging follow-up.  CT ABDOMEN AND PELVIS  Findings:  Abdomen/Pelvis: The appearance of the liver, gallbladder, pancreas, spleen and bilateral adrenal glands is unremarkable.  There are numerous nonobstructive calculi within the collecting systems of the kidneys bilaterally, the largest of which measure up to 6 mm in the upper pole of the left renal collecting system.  Two small cysts are present in the right kidney, largest of which is in the medial aspect of the interpolar right kidney measuring 1.7 cm in diameter.  Atherosclerosis of the abdominal and pelvic vasculature, without definite aneurysm or dissection.  There are a few scattered colonic diverticula, without definite surrounding inflammatory changes to suggest an acute diverticulitis at this time.  The appendix is normal.  No ascites or pneumoperitoneum and no pathologic distension of bowel.  No definite pathologic lymphadenopathy identified within the abdomen or pelvis.  Prostate and urinary bladder are unremarkable in appearance.  Musculoskeletal: Old healed fracture of the left inferior pubic ramus. There are no aggressive appearing lytic or blastic lesions noted in the visualized portions of the skeleton.  IMPRESSION:  1. 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  No ureteral stones or findings to suggest urinary tract obstruction at this time. 2.  No other acute findings in the abdomen or pelvis to account for the patient's symptoms. 3.  Mild colonic diverticulosis without findings to suggest acute diverticulitis at this time. 4.  Normal appendix.   Original Report Authenticated By: Florencia Reasons, M.D.    Dg Chest Port 1 View 12/07/2011   *RADIOLOGY REPORT*  Clinical Data: Chest pain  PORTABLE CHEST - 1 VIEW  Comparison: 01/30/2010  Findings: Mild left-sided cardiac silhouette enlargement stable. Vascular pattern normal.  Lungs clear.  IMPRESSION:  No change.  No acute findings.   Original Report Authenticated By: Otilio Carpen, M.D.      1100:  Denies any further CP while in the ED.  States abd pain has resolved.  Pt with concerning cardiac history.  Dx testing d/w pt and family.  Questions answered.  Verb understanding, agreeable to admit.  T/C to [facility], case discussed, including:  HPI, pertinent PM/SHx, VS/PE, dx testing, ED course and treatment:  Agreeable to observation admit, requests to write temporary orders, obtain tele bed to team 7.     Laray Anger, DO 12/07/11 2026

## 2011-12-08 LAB — URINE CULTURE
Colony Count: NO GROWTH
Culture: NO GROWTH

## 2011-12-08 LAB — COMPREHENSIVE METABOLIC PANEL
Albumin: 3.3 g/dL — ABNORMAL LOW (ref 3.5–5.2)
Alkaline Phosphatase: 64 U/L (ref 39–117)
BUN: 14 mg/dL (ref 6–23)
Calcium: 9.7 mg/dL (ref 8.4–10.5)
Creatinine, Ser: 0.71 mg/dL (ref 0.50–1.35)
Potassium: 3.5 mEq/L (ref 3.5–5.1)
Total Protein: 6.1 g/dL (ref 6.0–8.3)

## 2011-12-08 LAB — CBC
HCT: 43.6 % (ref 39.0–52.0)
Hemoglobin: 15.2 g/dL (ref 13.0–17.0)
MCV: 95 fL (ref 78.0–100.0)
RBC: 4.59 MIL/uL (ref 4.22–5.81)
WBC: 8.1 10*3/uL (ref 4.0–10.5)

## 2011-12-08 LAB — TROPONIN I: Troponin I: <0.3 ng/mL (ref <0.30)

## 2011-12-08 NOTE — Progress Notes (Signed)
Patient and wife given discharge instruction with verbal explanation and hard copy to take with them. No new or changed or stopped medications. No Rx's generated by MD.  Patient able to verbalize understanding by teach back method. Patient discharged home with wife via private vehicle. Escorted by NT to private vehicle via w/c. Harlow Asa

## 2011-12-09 ENCOUNTER — Encounter (HOSPITAL_COMMUNITY): Payer: Self-pay

## 2011-12-11 ENCOUNTER — Encounter (HOSPITAL_COMMUNITY): Payer: Self-pay

## 2011-12-13 ENCOUNTER — Encounter (HOSPITAL_COMMUNITY): Payer: Self-pay

## 2011-12-16 ENCOUNTER — Encounter (HOSPITAL_COMMUNITY): Payer: Self-pay

## 2011-12-18 ENCOUNTER — Encounter (HOSPITAL_COMMUNITY): Payer: Self-pay

## 2011-12-20 ENCOUNTER — Encounter (HOSPITAL_COMMUNITY)
Admission: RE | Admit: 2011-12-20 | Discharge: 2011-12-20 | Disposition: A | Discharge status: Home / Self Care | Payer: Self-pay | Source: Ambulatory Visit | Attending: Cardiovascular Disease | Admitting: Cardiovascular Disease

## 2011-12-23 ENCOUNTER — Encounter (HOSPITAL_COMMUNITY): Payer: Self-pay

## 2011-12-25 ENCOUNTER — Encounter (HOSPITAL_COMMUNITY)
Admission: RE | Admit: 2011-12-25 | Discharge: 2011-12-25 | Disposition: A | Discharge status: Home / Self Care | Payer: Self-pay | Source: Ambulatory Visit | Attending: Cardiovascular Disease | Admitting: Cardiovascular Disease

## 2011-12-27 ENCOUNTER — Other Ambulatory Visit: Payer: Self-pay | Admitting: Cardiovascular Disease

## 2011-12-27 ENCOUNTER — Encounter (HOSPITAL_COMMUNITY): Payer: Self-pay

## 2011-12-27 DIAGNOSIS — E785 Hyperlipidemia, unspecified: Secondary | ICD-10-CM | POA: Insufficient documentation

## 2011-12-27 DIAGNOSIS — I712 Thoracic aortic aneurysm, without rupture, unspecified: Secondary | ICD-10-CM | POA: Insufficient documentation

## 2011-12-27 DIAGNOSIS — I2510 Atherosclerotic heart disease of native coronary artery without angina pectoris: Secondary | ICD-10-CM | POA: Insufficient documentation

## 2011-12-27 DIAGNOSIS — I214 Non-ST elevation (NSTEMI) myocardial infarction: Secondary | ICD-10-CM | POA: Insufficient documentation

## 2011-12-27 DIAGNOSIS — K219 Gastro-esophageal reflux disease without esophagitis: Secondary | ICD-10-CM | POA: Insufficient documentation

## 2011-12-27 DIAGNOSIS — I10 Essential (primary) hypertension: Secondary | ICD-10-CM | POA: Insufficient documentation

## 2011-12-27 DIAGNOSIS — Z951 Presence of aortocoronary bypass graft: Secondary | ICD-10-CM | POA: Insufficient documentation

## 2011-12-27 DIAGNOSIS — Z5189 Encounter for other specified aftercare: Secondary | ICD-10-CM | POA: Insufficient documentation

## 2011-12-30 ENCOUNTER — Encounter (HOSPITAL_COMMUNITY)
Admission: RE | Admit: 2011-12-30 | Discharge: 2011-12-30 | Disposition: A | Discharge status: Home / Self Care | Payer: Self-pay | Source: Ambulatory Visit | Attending: Cardiovascular Disease | Admitting: Cardiovascular Disease

## 2012-01-01 ENCOUNTER — Encounter (HOSPITAL_COMMUNITY): Payer: Self-pay

## 2012-01-01 NOTE — Discharge Summary (Signed)
Physician Discharge Summary  Patient ID: Samuel Coleman MRN: [id number] DOB/AGE: 68-Jul-1947 68 y.o.  Admit date: 12/07/2011 Discharge date: 12/08/2011  Primary Care Physician:  Samuel Grippe, MD   Discharge Diagnoses:    Active Problems:  CAD, S/P CABG  THORACIC AORTIC ANEURYSM  Ischemic bowel disease  Atypical epigastric pain/Chest pain  Abdominal  pain, other specified site      Medication List     As of 01/01/2012 12:14 PM    TAKE these medications         allopurinol 300 MG tablet   Commonly known as: ZYLOPRIM   Take 300 mg by mouth daily.      aspirin EC 81 MG tablet   Take 81 mg by mouth daily.      dexlansoprazole 60 MG capsule   Commonly known as: DEXILANT   Take 60 mg by mouth daily.      diphenhydrAMINE 12.5 MG/5ML elixir   Commonly known as: BENADRYL   Take 12.5 mg by mouth 4 (four) times daily as needed. As needed for allergies.      lisinopril 20 MG tablet   Commonly known as: PRINIVIL,ZESTRIL   Take 20 mg by mouth daily.      metoprolol tartrate 25 MG tablet   Commonly known as: LOPRESSOR   Take 25 mg by mouth 2 (two) times daily.      multivitamin with minerals Tabs   Take 1 tablet by mouth daily.      simvastatin 40 MG tablet   Commonly known as: ZOCOR   Take 40 mg by mouth daily.      vitamin C 500 MG tablet   Commonly known as: ASCORBIC ACID   Take 500 mg by mouth daily.      VITAMIN D PO   Take 1 tablet by mouth daily.      vitamin E 400 UNIT capsule   Take 400 Units by mouth daily.         Disposition and Follow-up:  PCP in 1 week    Significant Diagnostic Studies:  Ct abd/pelvis IMPRESSION: 1. 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 No ureteral stones or findings to suggest urinary tract obstruction at this time. 2. No other acute findings in the abdomen or pelvis to account for the patient's  symptoms. 3. Mild colonic diverticulosis without findings to suggest acute diverticulitis at this time. 4. Normal appendix.    Brief H and P: : Mr.Samuel Coleman is a 66/M with h/o CABG, Ischemic colitis, who's had recurrent bouts of mild abdominal pain since his episode of colitis back in august, reportedly just started eating red meats in the last 2-3 days. He woke up this morning with severe abdominal pain which was bilateral sharp, associated with profuse diaphoresis, she subsequently felt the pain move up to the left side of his chest which made him concerned due to his h/o MI and presented to the ER.  In the ER he had a CT chest/abd/pelvis which was unremarkable, he subsequently had a bowel movement which was semisolid, without any blood and feels much better.      Hospital Course:  1. Abdominal pain: triggered by recent change in diet and improved after BM  CT/Exam unremarkable,  Bland diet, observed and discharged home next day in stable condition FU with Dr.Hung   2. CAD/CABG: EKG unchanged  Cardiac enzymes negative, tele unremarkable Continue ASA/Metoprolol  3. THORACIC AORTIC ANEURYSM  Stable base on CT, FU with CVTS     Time spent on Discharge:  SignedZannie Coleman [facility] Pager: [phone] 01/01/2012, 12:14 PM

## 2012-01-03 ENCOUNTER — Encounter (HOSPITAL_COMMUNITY): Payer: Self-pay

## 2012-01-06 ENCOUNTER — Encounter (HOSPITAL_COMMUNITY): Payer: Self-pay

## 2012-01-08 ENCOUNTER — Encounter (HOSPITAL_COMMUNITY)
Admission: RE | Admit: 2012-01-08 | Discharge: 2012-01-08 | Disposition: A | Discharge status: Home / Self Care | Payer: Self-pay | Source: Ambulatory Visit | Attending: Cardiovascular Disease | Admitting: Cardiovascular Disease

## 2012-01-08 ENCOUNTER — Encounter (HOSPITAL_COMMUNITY): Payer: Self-pay

## 2012-01-10 ENCOUNTER — Encounter (HOSPITAL_COMMUNITY): Payer: Self-pay

## 2012-01-13 ENCOUNTER — Encounter (HOSPITAL_COMMUNITY): Payer: Self-pay

## 2012-01-13 ENCOUNTER — Encounter (HOSPITAL_COMMUNITY)
Admission: RE | Admit: 2012-01-13 | Discharge: 2012-01-13 | Disposition: A | Discharge status: Home / Self Care | Payer: Self-pay | Source: Ambulatory Visit | Attending: Cardiovascular Disease | Admitting: Cardiovascular Disease

## 2012-01-15 ENCOUNTER — Encounter (HOSPITAL_COMMUNITY): Payer: Self-pay

## 2012-01-16 ENCOUNTER — Encounter: Payer: Self-pay | Admitting: Cardiovascular Disease

## 2012-01-17 ENCOUNTER — Encounter (HOSPITAL_COMMUNITY): Payer: Self-pay

## 2012-01-20 ENCOUNTER — Encounter (HOSPITAL_COMMUNITY): Payer: Self-pay

## 2012-01-20 ENCOUNTER — Encounter (HOSPITAL_COMMUNITY)
Admission: RE | Admit: 2012-01-20 | Discharge: 2012-01-20 | Disposition: A | Discharge status: Home / Self Care | Payer: Self-pay | Source: Ambulatory Visit | Attending: Cardiovascular Disease | Admitting: Cardiovascular Disease

## 2012-01-22 ENCOUNTER — Encounter (HOSPITAL_COMMUNITY): Payer: Self-pay

## 2012-01-24 ENCOUNTER — Encounter (HOSPITAL_COMMUNITY): Payer: Self-pay

## 2012-01-27 ENCOUNTER — Encounter (HOSPITAL_COMMUNITY): Payer: Self-pay

## 2012-01-27 ENCOUNTER — Encounter (HOSPITAL_COMMUNITY): Payer: Medicare Other

## 2012-01-27 DIAGNOSIS — I10 Essential (primary) hypertension: Secondary | ICD-10-CM | POA: Insufficient documentation

## 2012-01-27 DIAGNOSIS — Z5189 Encounter for other specified aftercare: Secondary | ICD-10-CM | POA: Insufficient documentation

## 2012-01-27 DIAGNOSIS — I2510 Atherosclerotic heart disease of native coronary artery without angina pectoris: Secondary | ICD-10-CM | POA: Insufficient documentation

## 2012-01-27 DIAGNOSIS — E785 Hyperlipidemia, unspecified: Secondary | ICD-10-CM | POA: Insufficient documentation

## 2012-01-27 DIAGNOSIS — I712 Thoracic aortic aneurysm, without rupture, unspecified: Secondary | ICD-10-CM | POA: Insufficient documentation

## 2012-01-27 DIAGNOSIS — Z951 Presence of aortocoronary bypass graft: Secondary | ICD-10-CM | POA: Insufficient documentation

## 2012-01-27 DIAGNOSIS — I214 Non-ST elevation (NSTEMI) myocardial infarction: Secondary | ICD-10-CM | POA: Insufficient documentation

## 2012-01-27 DIAGNOSIS — K219 Gastro-esophageal reflux disease without esophagitis: Secondary | ICD-10-CM | POA: Insufficient documentation

## 2012-01-29 ENCOUNTER — Encounter (HOSPITAL_COMMUNITY): Payer: Self-pay

## 2012-01-29 ENCOUNTER — Encounter (HOSPITAL_COMMUNITY): Payer: Medicare Other

## 2012-01-31 ENCOUNTER — Encounter (HOSPITAL_COMMUNITY): Payer: Self-pay

## 2012-01-31 ENCOUNTER — Encounter (HOSPITAL_COMMUNITY): Payer: Medicare Other

## 2012-02-03 ENCOUNTER — Encounter (HOSPITAL_COMMUNITY): Payer: Medicare Other

## 2012-02-03 ENCOUNTER — Encounter (HOSPITAL_COMMUNITY)
Admission: RE | Admit: 2012-02-03 | Discharge: 2012-02-03 | Disposition: A | Discharge status: Home / Self Care | Payer: Self-pay | Source: Ambulatory Visit | Attending: Cardiovascular Disease | Admitting: Cardiovascular Disease

## 2012-02-05 ENCOUNTER — Encounter (HOSPITAL_COMMUNITY): Payer: Medicare Other

## 2012-02-05 ENCOUNTER — Encounter (HOSPITAL_COMMUNITY): Payer: Self-pay

## 2012-02-07 ENCOUNTER — Encounter (HOSPITAL_COMMUNITY): Payer: Medicare Other

## 2012-02-07 ENCOUNTER — Encounter (HOSPITAL_COMMUNITY): Payer: Self-pay

## 2012-02-10 ENCOUNTER — Encounter (HOSPITAL_COMMUNITY): Payer: Medicare Other

## 2012-02-10 ENCOUNTER — Encounter (HOSPITAL_COMMUNITY): Payer: Self-pay

## 2012-02-12 ENCOUNTER — Encounter (HOSPITAL_COMMUNITY): Payer: Medicare Other

## 2012-02-12 ENCOUNTER — Encounter (HOSPITAL_COMMUNITY): Payer: Self-pay

## 2012-02-14 ENCOUNTER — Encounter (HOSPITAL_COMMUNITY): Payer: Medicare Other

## 2012-02-14 ENCOUNTER — Encounter (HOSPITAL_COMMUNITY): Payer: Self-pay

## 2012-02-17 ENCOUNTER — Other Ambulatory Visit: Payer: Self-pay | Admitting: Cardiovascular Disease

## 2012-02-17 ENCOUNTER — Encounter (HOSPITAL_COMMUNITY): Payer: Medicare Other

## 2012-02-17 ENCOUNTER — Encounter (HOSPITAL_COMMUNITY): Payer: Self-pay

## 2012-02-19 ENCOUNTER — Encounter (HOSPITAL_COMMUNITY): Payer: Medicare Other

## 2012-02-21 ENCOUNTER — Encounter (HOSPITAL_COMMUNITY): Payer: Self-pay

## 2012-02-21 ENCOUNTER — Encounter (HOSPITAL_COMMUNITY): Payer: Medicare Other

## 2012-02-24 ENCOUNTER — Encounter (HOSPITAL_COMMUNITY): Payer: Self-pay

## 2012-02-24 ENCOUNTER — Encounter (HOSPITAL_COMMUNITY): Payer: Medicare Other

## 2012-02-26 ENCOUNTER — Encounter (HOSPITAL_COMMUNITY): Payer: Medicare Other

## 2012-02-28 ENCOUNTER — Encounter (HOSPITAL_COMMUNITY): Payer: Medicare Other

## 2012-02-28 ENCOUNTER — Encounter (HOSPITAL_COMMUNITY): Payer: Self-pay

## 2012-02-28 DIAGNOSIS — Z951 Presence of aortocoronary bypass graft: Secondary | ICD-10-CM | POA: Insufficient documentation

## 2012-02-28 DIAGNOSIS — E785 Hyperlipidemia, unspecified: Secondary | ICD-10-CM | POA: Insufficient documentation

## 2012-02-28 DIAGNOSIS — K219 Gastro-esophageal reflux disease without esophagitis: Secondary | ICD-10-CM | POA: Insufficient documentation

## 2012-02-28 DIAGNOSIS — I214 Non-ST elevation (NSTEMI) myocardial infarction: Secondary | ICD-10-CM | POA: Insufficient documentation

## 2012-02-28 DIAGNOSIS — I712 Thoracic aortic aneurysm, without rupture, unspecified: Secondary | ICD-10-CM | POA: Insufficient documentation

## 2012-02-28 DIAGNOSIS — I2510 Atherosclerotic heart disease of native coronary artery without angina pectoris: Secondary | ICD-10-CM | POA: Insufficient documentation

## 2012-02-28 DIAGNOSIS — Z5189 Encounter for other specified aftercare: Secondary | ICD-10-CM | POA: Insufficient documentation

## 2012-02-28 DIAGNOSIS — I10 Essential (primary) hypertension: Secondary | ICD-10-CM | POA: Insufficient documentation

## 2012-03-02 ENCOUNTER — Encounter (HOSPITAL_COMMUNITY): Payer: Medicare Other

## 2012-03-02 ENCOUNTER — Encounter (HOSPITAL_COMMUNITY): Payer: Self-pay

## 2012-03-04 ENCOUNTER — Encounter (HOSPITAL_COMMUNITY): Payer: Self-pay

## 2012-03-04 ENCOUNTER — Encounter (HOSPITAL_COMMUNITY): Payer: Medicare Other

## 2012-03-06 ENCOUNTER — Encounter (HOSPITAL_COMMUNITY): Payer: Medicare Other

## 2012-03-06 ENCOUNTER — Encounter (HOSPITAL_COMMUNITY): Payer: Self-pay

## 2012-03-09 ENCOUNTER — Encounter (HOSPITAL_COMMUNITY): Payer: Self-pay

## 2012-03-09 ENCOUNTER — Encounter (HOSPITAL_COMMUNITY): Payer: Medicare Other

## 2012-03-11 ENCOUNTER — Encounter (HOSPITAL_COMMUNITY): Payer: Medicare Other

## 2012-03-11 ENCOUNTER — Encounter (HOSPITAL_COMMUNITY): Payer: Self-pay

## 2012-03-13 ENCOUNTER — Encounter (HOSPITAL_COMMUNITY): Payer: Self-pay

## 2012-03-13 ENCOUNTER — Encounter (HOSPITAL_COMMUNITY): Payer: Medicare Other

## 2012-03-16 ENCOUNTER — Ambulatory Visit: Payer: Medicare Other | Admitting: Nurse Practitioner

## 2012-03-16 ENCOUNTER — Encounter (HOSPITAL_COMMUNITY): Payer: Self-pay

## 2012-03-16 ENCOUNTER — Encounter (HOSPITAL_COMMUNITY): Payer: Medicare Other

## 2012-03-18 ENCOUNTER — Ambulatory Visit (INDEPENDENT_AMBULATORY_CARE_PROVIDER_SITE_OTHER): Payer: Medicare Other | Admitting: Nurse Practitioner

## 2012-03-18 ENCOUNTER — Encounter (HOSPITAL_COMMUNITY): Payer: Self-pay

## 2012-03-18 ENCOUNTER — Encounter (HOSPITAL_COMMUNITY): Payer: Medicare Other

## 2012-03-18 ENCOUNTER — Encounter: Payer: Self-pay | Admitting: Nurse Practitioner

## 2012-03-18 ENCOUNTER — Telehealth: Payer: Self-pay | Admitting: *Deleted

## 2012-03-18 ENCOUNTER — Encounter (INDEPENDENT_AMBULATORY_CARE_PROVIDER_SITE_OTHER): Payer: Medicare Other

## 2012-03-18 VITALS — BP 138/88 | HR 72 | Ht 70.0 in | Wt 191.5 lb

## 2012-03-18 DIAGNOSIS — I10 Essential (primary) hypertension: Secondary | ICD-10-CM

## 2012-03-18 DIAGNOSIS — R002 Palpitations: Secondary | ICD-10-CM

## 2012-03-18 NOTE — Patient Instructions (Addendum)
We are going to place a monitor for the next 30 days to look at your heart rhythm  For now, stay on your current medicines.  Monitor your blood pressure at home. Goal is 135/85 most of the time.   See Dr. Clifton James in 5 weeks for discussion  Call the [facility] office at [phone] if you have any questions, problems or concerns.

## 2012-03-18 NOTE — Progress Notes (Signed)
Samuel Coleman Date of Birth: [birth year] Medical Record #[id number]  History of Present Illness: Mr. Xiang is seen today for a work in visit. He is seen for Dr. Clifton James. He has known CAD with prior inferior MI and emergent CABG back in 2011 with LIMA to LAD, SVG to OM and SVG to RCA. Other issues include gout, HTN, thoracic aneurysm (last CT in October 2013), and HLD. He has had some issues with colitis, felt to be ischemic back in August. There was concern at that time that it was due to hypotension and his medicine was cut back. His last visit here was in August.   He comes in today. He is here alone. He is having issues with his blood pressure and his heart rate. Some of his BP readings from home are a little elevated but most are satisfactory and less than 135/85. He had one "strange feeling" a couple of weeks ago where he felt exhausted. BP was up but he rested and felt better. He has had some palpitations. Notes that his heart skips. HR has been 45 at times. Saw Dr. Selena Batten last week and had his ACE increased and his lopressor decreased further. He is under a lot of stress with caring for his wife. She has cancer. He is not sleeping well. Very stressed. Still trying to work. No chest pain. Has stopped going to cardiac rehab because of his time constraints. No recent colitis reported. No syncope.   Current Outpatient Prescriptions on File Prior to Visit  Medication Sig Dispense Refill  . allopurinol (ZYLOPRIM) 300 MG tablet Take 300 mg by mouth daily.      Marland Kitchen aspirin EC 81 MG tablet Take 81 mg by mouth daily.      . Cholecalciferol (VITAMIN D PO) Take 1 tablet by mouth daily.      Marland Kitchen dexlansoprazole (DEXILANT) 60 MG capsule Take 60 mg by mouth daily.       . diphenhydrAMINE (BENADRYL) 12.5 MG/5ML elixir Take 12.5 mg by mouth 4 (four) times daily as needed. As needed for allergies.      . metoprolol tartrate (LOPRESSOR) 25 MG tablet Take 25 mg by mouth 2 (two) times daily.      . Multiple  Vitamin (MULTIVITAMIN WITH MINERALS) TABS Take 1 tablet by mouth daily.      . simvastatin (ZOCOR) 40 MG tablet Take 40 mg by mouth daily.       . vitamin C (ASCORBIC ACID) 500 MG tablet Take 500 mg by mouth daily.        . vitamin E 400 UNIT capsule Take 400 Units by mouth daily.          No Known Allergies  Past Medical History  Diagnosis Date  . 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 Thoracic aortic aneurysm      stable on CT 01/13/10  . Hyperlipidemia   . Gout   . Asthma   . Blood transfusion   . GERD (gastroesophageal reflux disease)   . Hypertension   . Ischemic colitis     noted on CT back in August 2013 possible related to hypotension    Past Surgical History  Procedure Date  . Coronary artery bypass graft   . Tonsillectomy   . Excision of deep left neck mass. surgeon:  christopher e. newman, m.d.     History  Smoking  status  . Former Smoker  . Quit date: 02/25/1974  Smokeless tobacco  . Not on file    History  Alcohol Use  . 0.6 oz/week  . 1 Glasses of wine per week    Comment: weekly    Family History  Problem Relation Age of Onset  . Heart failure Mother   . Pneumonia Father   . Cancer Sister     lung  . Cancer Maternal Grandmother     pancreatic  . Cancer Sister     esophagus    Review of Systems: The review of systems is per the HPI.  All other systems were reviewed and are negative.  Physical Exam: BP 138/88  Pulse 72  Ht 5\' 10"  (1.778 m)  Wt 191 lb 8 oz (86.864 kg)  BMI 27.48 kg/m2 Patient is very pleasant and in no acute distress. He seems a little depressed and fatigued to me. Skin is warm and dry. Color is normal.  HEENT is unremarkable. Normocephalic/atraumatic. PERRL. Sclera are nonicteric. Neck is supple. No masses. No JVD. Lungs are clear. Cardiac exam shows a regular rate and rhythm. Abdomen is soft. Extremities are without edema. Gait and ROM  are intact. No gross neurologic deficits noted.  LABORATORY DATA:  Lab Results  Component Value Date   WBC 8.1 12/08/2011   HGB 15.2 12/08/2011   HCT 43.6 12/08/2011   PLT 169 12/08/2011   GLUCOSE 132* 12/08/2011   ALT 22 12/08/2011   AST 20 12/08/2011   NA 137 12/08/2011   K 3.5 12/08/2011   CL 102 12/08/2011   CREATININE 0.71 12/08/2011   BUN 14 12/08/2011   CO2 25 12/08/2011   TSH 0.810 10/04/2011   INR 1.12 10/04/2011   HGBA1C  Value: 5.7 (NOTE)                                                                       According to the ADA Clinical Practice Recommendations for 2011, when HbA1c is used as a screening test:   >=6.5%   Diagnostic of Diabetes Mellitus           (if abnormal result  is confirmed)  5.7-6.4%   Increased risk of developing Diabetes Mellitus  References:Diagnosis and Classification of Diabetes Mellitus,Diabetes Care,2011,34(Suppl 1):S62-S69 and Standards of Medical Care in         Diabetes - 2011,Diabetes Care,2011,34  (Suppl 1):S11-S61.* 01/01/2010    Assessment / Plan:  1. HTN - blood pressure for the most part looks good. I would not change his medicines. I have asked him to continue to monitor at home. Goal is 135/85. If he has consistent readings higher than this then he will need medication adjustment.   2. CAD - prior inferior MI/CABG back in 2011 - no symptoms reported.   3. HLD - on statin  4. Thoracic aneurysm - last measured in October 2013 at 3.9 by 3.6.   5. Ischemic colitis back in August - seems stable.  6. Palpitations - concern for bradycardia - on less Lopressor and now with more "skips". Will place an event monitor to rule out significant arrhythmia.   7. Situational stress - I think this is the crux of his issues as he is in  the "caregiver role".   Patient is agreeable to this plan and will call if any problems develop in the interim.

## 2012-03-18 NOTE — Telephone Encounter (Signed)
30 day event moniter placed on Pt 03/18/12 TK

## 2012-03-20 ENCOUNTER — Encounter (HOSPITAL_COMMUNITY)
Admission: RE | Admit: 2012-03-20 | Discharge: 2012-03-20 | Disposition: A | Discharge status: Home / Self Care | Payer: Self-pay | Source: Ambulatory Visit | Attending: Cardiovascular Disease | Admitting: Cardiovascular Disease

## 2012-03-20 ENCOUNTER — Encounter (HOSPITAL_COMMUNITY): Payer: Medicare Other

## 2012-03-20 NOTE — Progress Notes (Signed)
Pt returned to exercise today after being out since early December. Per pt, he is wearing a 30-day event monitor because of palpitations he experience. Tolerated exercise today without c/o, some elevated BP, HR within normal limits. Reviewed medication list with pt.

## 2012-03-23 ENCOUNTER — Encounter (HOSPITAL_COMMUNITY): Payer: Medicare Other

## 2012-03-23 ENCOUNTER — Encounter (HOSPITAL_COMMUNITY): Payer: Self-pay

## 2012-03-25 ENCOUNTER — Encounter (HOSPITAL_COMMUNITY): Payer: Medicare Other

## 2012-03-25 ENCOUNTER — Encounter (HOSPITAL_COMMUNITY): Payer: Self-pay

## 2012-03-27 ENCOUNTER — Encounter (HOSPITAL_COMMUNITY): Payer: Medicare Other

## 2012-03-27 ENCOUNTER — Encounter (HOSPITAL_COMMUNITY): Payer: Self-pay

## 2012-03-30 ENCOUNTER — Encounter (HOSPITAL_COMMUNITY): Payer: Self-pay | Attending: Cardiovascular Disease

## 2012-03-30 ENCOUNTER — Encounter (HOSPITAL_COMMUNITY): Payer: Self-pay

## 2012-03-30 DIAGNOSIS — I2510 Atherosclerotic heart disease of native coronary artery without angina pectoris: Secondary | ICD-10-CM | POA: Insufficient documentation

## 2012-03-30 DIAGNOSIS — I214 Non-ST elevation (NSTEMI) myocardial infarction: Secondary | ICD-10-CM | POA: Insufficient documentation

## 2012-03-30 DIAGNOSIS — I712 Thoracic aortic aneurysm, without rupture, unspecified: Secondary | ICD-10-CM | POA: Insufficient documentation

## 2012-03-30 DIAGNOSIS — Z951 Presence of aortocoronary bypass graft: Secondary | ICD-10-CM | POA: Insufficient documentation

## 2012-03-30 DIAGNOSIS — I10 Essential (primary) hypertension: Secondary | ICD-10-CM | POA: Insufficient documentation

## 2012-03-30 DIAGNOSIS — Z5189 Encounter for other specified aftercare: Secondary | ICD-10-CM | POA: Insufficient documentation

## 2012-03-30 DIAGNOSIS — K219 Gastro-esophageal reflux disease without esophagitis: Secondary | ICD-10-CM | POA: Insufficient documentation

## 2012-03-30 DIAGNOSIS — E785 Hyperlipidemia, unspecified: Secondary | ICD-10-CM | POA: Insufficient documentation

## 2012-04-01 ENCOUNTER — Encounter (HOSPITAL_COMMUNITY): Payer: Self-pay

## 2012-04-03 ENCOUNTER — Encounter (HOSPITAL_COMMUNITY): Payer: Self-pay

## 2012-04-06 ENCOUNTER — Encounter (HOSPITAL_COMMUNITY): Payer: Self-pay

## 2012-04-08 ENCOUNTER — Encounter (HOSPITAL_COMMUNITY): Payer: Self-pay

## 2012-04-10 ENCOUNTER — Encounter (HOSPITAL_COMMUNITY): Payer: Self-pay

## 2012-04-13 ENCOUNTER — Encounter (HOSPITAL_COMMUNITY): Payer: Self-pay

## 2012-04-15 ENCOUNTER — Encounter (HOSPITAL_COMMUNITY): Admission: RE | Admit: 2012-04-15 | Payer: Self-pay | Source: Ambulatory Visit

## 2012-04-15 ENCOUNTER — Encounter (HOSPITAL_COMMUNITY): Payer: Self-pay

## 2012-04-17 ENCOUNTER — Encounter (HOSPITAL_COMMUNITY): Payer: Self-pay

## 2012-04-20 ENCOUNTER — Encounter (HOSPITAL_COMMUNITY): Payer: Self-pay

## 2012-04-22 ENCOUNTER — Encounter (HOSPITAL_COMMUNITY): Payer: Self-pay

## 2012-04-24 ENCOUNTER — Encounter (HOSPITAL_COMMUNITY): Payer: Self-pay

## 2012-04-27 ENCOUNTER — Ambulatory Visit: Payer: Medicare Other | Admitting: Cardiovascular Disease

## 2012-04-27 ENCOUNTER — Encounter (HOSPITAL_COMMUNITY): Payer: Self-pay | Attending: Cardiovascular Disease

## 2012-04-27 ENCOUNTER — Encounter (HOSPITAL_COMMUNITY): Payer: Self-pay

## 2012-04-27 ENCOUNTER — Telehealth: Payer: Self-pay | Admitting: Cardiovascular Disease

## 2012-04-27 DIAGNOSIS — I712 Thoracic aortic aneurysm, without rupture, unspecified: Secondary | ICD-10-CM | POA: Insufficient documentation

## 2012-04-27 DIAGNOSIS — I214 Non-ST elevation (NSTEMI) myocardial infarction: Secondary | ICD-10-CM | POA: Insufficient documentation

## 2012-04-27 DIAGNOSIS — Z951 Presence of aortocoronary bypass graft: Secondary | ICD-10-CM | POA: Insufficient documentation

## 2012-04-27 DIAGNOSIS — Z5189 Encounter for other specified aftercare: Secondary | ICD-10-CM | POA: Insufficient documentation

## 2012-04-27 DIAGNOSIS — I10 Essential (primary) hypertension: Secondary | ICD-10-CM | POA: Insufficient documentation

## 2012-04-27 DIAGNOSIS — E785 Hyperlipidemia, unspecified: Secondary | ICD-10-CM | POA: Insufficient documentation

## 2012-04-27 DIAGNOSIS — I2510 Atherosclerotic heart disease of native coronary artery without angina pectoris: Secondary | ICD-10-CM | POA: Insufficient documentation

## 2012-04-27 DIAGNOSIS — K219 Gastro-esophageal reflux disease without esophagitis: Secondary | ICD-10-CM | POA: Insufficient documentation

## 2012-04-27 NOTE — Telephone Encounter (Signed)
New problem    Pt cancel appt today due to wife in the hospital. Offer an appt with PA patient refuse . Offer first available on  3/28 . Patient stated he wanted to the nurse to call him

## 2012-04-27 NOTE — Telephone Encounter (Signed)
Left message to call back  

## 2012-04-29 ENCOUNTER — Encounter (HOSPITAL_COMMUNITY): Payer: Self-pay

## 2012-04-29 NOTE — Telephone Encounter (Signed)
Spoke with pt and reviewed monitor results with him. He is feeling well.  Appt made for him to see Dr. Clifton James on May 18, 2012 at 9:00

## 2012-05-01 ENCOUNTER — Encounter (HOSPITAL_COMMUNITY): Payer: Self-pay

## 2012-05-04 ENCOUNTER — Encounter (HOSPITAL_COMMUNITY): Payer: Self-pay

## 2012-05-06 ENCOUNTER — Encounter (HOSPITAL_COMMUNITY): Payer: Self-pay

## 2012-05-08 ENCOUNTER — Encounter (HOSPITAL_COMMUNITY): Payer: Self-pay

## 2012-05-11 ENCOUNTER — Encounter (HOSPITAL_COMMUNITY): Payer: Self-pay

## 2012-05-13 ENCOUNTER — Encounter (HOSPITAL_COMMUNITY): Payer: Self-pay

## 2012-05-15 ENCOUNTER — Encounter (HOSPITAL_COMMUNITY): Payer: Self-pay

## 2012-05-18 ENCOUNTER — Encounter (HOSPITAL_COMMUNITY): Payer: Self-pay

## 2012-05-18 ENCOUNTER — Ambulatory Visit: Payer: Medicare Other | Admitting: Cardiovascular Disease

## 2012-05-19 ENCOUNTER — Telehealth: Payer: Self-pay | Admitting: Cardiovascular Disease

## 2012-05-19 NOTE — Telephone Encounter (Signed)
New Problem:    Patient called in wanting to speak with a nurse about his latest cardiac monitor results.  Please call back.

## 2012-05-19 NOTE — Telephone Encounter (Signed)
Spoke to patient he stated he missed appointment with Dr.McAlhany 06-02-2012 his wife passed away and her funeral was June 02, 2012.Patient wants to reschedule appointment.Patient was told Dr.McAlhany's nurse out of office will send message to her to reschedule appointment.

## 2012-05-19 NOTE — Telephone Encounter (Signed)
Patient called no answer.LMTC. 

## 2012-05-20 ENCOUNTER — Encounter (HOSPITAL_COMMUNITY): Payer: Self-pay

## 2012-05-21 NOTE — Telephone Encounter (Signed)
Spoke with pt and appt made for him to see Dr. Clifton James on May 29, 2012 at 8:45

## 2012-05-21 NOTE — Telephone Encounter (Signed)
Left message to call back  

## 2012-05-22 ENCOUNTER — Encounter (HOSPITAL_COMMUNITY): Payer: Self-pay

## 2012-05-25 ENCOUNTER — Encounter (HOSPITAL_COMMUNITY): Payer: Self-pay | Attending: Cardiovascular Disease

## 2012-05-25 ENCOUNTER — Other Ambulatory Visit: Payer: Self-pay | Admitting: Cardiovascular Disease

## 2012-05-25 ENCOUNTER — Encounter (HOSPITAL_COMMUNITY): Payer: Self-pay

## 2012-05-27 ENCOUNTER — Encounter (HOSPITAL_COMMUNITY): Payer: Self-pay

## 2012-05-27 DIAGNOSIS — Z9861 Coronary angioplasty status: Secondary | ICD-10-CM | POA: Insufficient documentation

## 2012-05-27 DIAGNOSIS — I2510 Atherosclerotic heart disease of native coronary artery without angina pectoris: Secondary | ICD-10-CM | POA: Insufficient documentation

## 2012-05-27 DIAGNOSIS — I10 Essential (primary) hypertension: Secondary | ICD-10-CM | POA: Insufficient documentation

## 2012-05-27 DIAGNOSIS — I252 Old myocardial infarction: Secondary | ICD-10-CM | POA: Insufficient documentation

## 2012-05-27 DIAGNOSIS — Z5189 Encounter for other specified aftercare: Secondary | ICD-10-CM | POA: Insufficient documentation

## 2012-05-29 ENCOUNTER — Encounter: Payer: Self-pay | Admitting: Cardiovascular Disease

## 2012-05-29 ENCOUNTER — Ambulatory Visit (INDEPENDENT_AMBULATORY_CARE_PROVIDER_SITE_OTHER): Payer: Medicare Other | Admitting: Cardiovascular Disease

## 2012-05-29 ENCOUNTER — Encounter (HOSPITAL_COMMUNITY)
Admission: RE | Admit: 2012-05-29 | Discharge: 2012-05-29 | Disposition: A | Discharge status: Home / Self Care | Payer: Self-pay | Source: Ambulatory Visit | Attending: Cardiovascular Disease | Admitting: Cardiovascular Disease

## 2012-05-29 VITALS — BP 141/68 | HR 82 | Ht 70.0 in | Wt 197.0 lb

## 2012-05-29 DIAGNOSIS — I4949 Other premature depolarization: Secondary | ICD-10-CM

## 2012-05-29 DIAGNOSIS — I10 Essential (primary) hypertension: Secondary | ICD-10-CM

## 2012-05-29 DIAGNOSIS — I712 Thoracic aortic aneurysm, without rupture, unspecified: Secondary | ICD-10-CM

## 2012-05-29 DIAGNOSIS — I25810 Atherosclerosis of coronary artery bypass graft(s) without angina pectoris: Secondary | ICD-10-CM

## 2012-05-29 DIAGNOSIS — I493 Ventricular premature depolarization: Secondary | ICD-10-CM

## 2012-05-29 DIAGNOSIS — R079 Chest pain, unspecified: Secondary | ICD-10-CM

## 2012-05-29 MED ORDER — LISINOPRIL 20 MG PO TABS: 20.0000 mg | ORAL_TABLET | Freq: Two times a day (BID) | ORAL | Status: DC

## 2012-05-29 NOTE — Patient Instructions (Addendum)
Your physician wants you to follow-up in: 6 months.You will receive a reminder letter in the mail two months in advance. If you don't receive a letter, please call our office to schedule the follow-up appointment.  Your physician has requested that you have an exercise stress myoview. For further information please visit [email or web address] Please follow instruction sheet, as given.   Lisinopril is 20 mg by mouth twice daily

## 2012-05-29 NOTE — Progress Notes (Signed)
History of Present Illness: 67 yo WM with history of CAD, HLD, HTN who is here today for cardiac follow up. He was admitted to [facility] 01/01/10 with an acute inferior STEMI. Emergent cardiac cath after code STEMI called. He was found to have severe three vessel CAD. He underwent 3V CABG later that day with Dr. Laneta Simmers performing. (LIMA to LAD, SVG to OM, SVG to RCA). He was admitted to [facility] August  2013 for abdominal pain and felt to have ischemic colitis and his beta blocker dose was reduced. He also had some bradycardia. He was seen by Norma Fredrickson, NP in January 2014 and was doing well but c/o skipped beats. Event monitor with PVCs, NSR.   He is here today for follow up. He describes chest pressure after working. He has not been exercising. No sOB. His wife died 2 weeks ago from cancer. Much stress at home.   Primary Care Physician: Pearson Grippe   Last Lipid Profile: Followed in primary care and well controlled per pt.   Past Medical History  Diagnosis Date  . 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 Thoracic aortic aneurysm      stable on CT 01/13/10  . Hyperlipidemia   . Gout   . Asthma   . Blood transfusion   . GERD (gastroesophageal reflux disease)   . Hypertension   . Ischemic colitis     noted on CT back in August 2013 possible related to hypotension    Past Surgical History  Procedure Laterality Date  . Coronary artery bypass graft    . Tonsillectomy    . Excision of deep left neck mass. surgeon:  christopher e. newman, m.d.      Current Outpatient Prescriptions  Medication Sig Dispense Refill  . allopurinol (ZYLOPRIM) 300 MG tablet Take 300 mg by mouth daily.      Marland Kitchen aspirin EC 81 MG tablet Take 81 mg by mouth daily.      . Cholecalciferol (VITAMIN D PO) Take 1 tablet by mouth daily.      Marland Kitchen dexlansoprazole (DEXILANT) 60 MG capsule Take 60 mg by mouth daily.       .  diphenhydrAMINE (BENADRYL) 12.5 MG/5ML elixir Take 12.5 mg by mouth 4 (four) times daily as needed. As needed for allergies.      . Eszopiclone (LUNESTA PO) Take by mouth at bedtime.      Marland Kitchen HYOSCYAMINE PO Take by mouth as needed.      Marland Kitchen lisinopril (PRINIVIL,ZESTRIL) 20 MG tablet Take 20 mg by mouth 2 (two) times daily.       . metoprolol tartrate (LOPRESSOR) 25 MG tablet Take 25 mg by mouth 2 (two) times daily.      . Multiple Vitamin (MULTIVITAMIN WITH MINERALS) TABS Take 1 tablet by mouth daily.      . simvastatin (ZOCOR) 40 MG tablet Take 40 mg by mouth daily.       . vitamin C (ASCORBIC ACID) 500 MG tablet Take 500 mg by mouth daily.        . vitamin E 400 UNIT capsule Take 400 Units by mouth daily.         No current facility-administered medications for this visit.    No Known Allergies  History   Social History  . Marital Status: Married    Spouse Name: N/A    Number  of Children: N/A  . Years of Education: N/A   Occupational History  . Not on file.   Social History Main Topics  . Smoking status: Former Smoker    Quit date: 02/25/1974  . Smokeless tobacco: Not on file  . Alcohol Use: 0.6 oz/week    1 Glasses of wine per week     Comment: weekly  . Drug Use: No  . Sexually Active: Not on file   Other Topics Concern  . Not on file   Social History Narrative  . No narrative on file    Family History  Problem Relation Age of Onset  . Heart failure Mother   . Pneumonia Father   . Cancer Sister     lung  . Cancer Maternal Grandmother     pancreatic  . Cancer Sister     esophagus    Review of Systems:  As stated in the HPI and otherwise negative.   BP 141/68  Pulse 82  Ht 5\' 10"  (1.778 m)  Wt 197 lb (89.359 kg)  BMI 28.27 kg/m2  Physical Examination: General: Well developed, well nourished, NAD HEENT: OP clear, mucus membranes moist SKIN: warm, dry. No rashes. Neuro: No focal deficits Musculoskeletal: Muscle strength 5/5 all ext Psychiatric: Mood  and affect normal Neck: No JVD, no carotid bruits, no thyromegaly, no lymphadenopathy. Lungs:Clear bilaterally, no wheezes, rhonci, crackles Cardiovascular: Regular rate and rhythm. No murmurs, gallops or rubs. Abdomen:Soft. Bowel sounds present. Non-tender.  Extremities: No lower extremity edema. Pulses are 2 + in the bilateral DP/PT.  Assessment and Plan:   1. CAD: Recent chest discomfort with exertion and stress. Will arrange exercise stress myoview to exclude ischemia. Continue beta blocker, ASA, statin, Ace-inh.  2.  HTN:  BP controlled. No changes  3.  HLD: Continue statin.Lipids followed in primary care.   4. Thoracic aortic aneurysm:  CTA chest October 2013 with 3.9 by 3.6 proximal descending thoracic aorta.   5. Palpitations: PVCs on event monitor. Continue beta blocker.

## 2012-06-01 ENCOUNTER — Encounter (HOSPITAL_COMMUNITY)
Admission: RE | Admit: 2012-06-01 | Discharge: 2012-06-01 | Disposition: A | Discharge status: Home / Self Care | Payer: Self-pay | Source: Ambulatory Visit | Attending: Cardiovascular Disease | Admitting: Cardiovascular Disease

## 2012-06-03 ENCOUNTER — Encounter (HOSPITAL_COMMUNITY)
Admission: RE | Admit: 2012-06-03 | Discharge: 2012-06-03 | Disposition: A | Discharge status: Home / Self Care | Payer: Self-pay | Source: Ambulatory Visit | Attending: Cardiovascular Disease | Admitting: Cardiovascular Disease

## 2012-06-04 ENCOUNTER — Ambulatory Visit (HOSPITAL_COMMUNITY): Payer: Medicare Other | Attending: Internal Medicine | Admitting: Radiology

## 2012-06-04 VITALS — BP 146/74 | HR 83 | Ht 70.0 in | Wt 195.0 lb

## 2012-06-04 DIAGNOSIS — R002 Palpitations: Secondary | ICD-10-CM | POA: Insufficient documentation

## 2012-06-04 DIAGNOSIS — I252 Old myocardial infarction: Secondary | ICD-10-CM | POA: Insufficient documentation

## 2012-06-04 DIAGNOSIS — I10 Essential (primary) hypertension: Secondary | ICD-10-CM | POA: Insufficient documentation

## 2012-06-04 DIAGNOSIS — I6529 Occlusion and stenosis of unspecified carotid artery: Secondary | ICD-10-CM | POA: Insufficient documentation

## 2012-06-04 DIAGNOSIS — I25810 Atherosclerosis of coronary artery bypass graft(s) without angina pectoris: Secondary | ICD-10-CM

## 2012-06-04 DIAGNOSIS — Z951 Presence of aortocoronary bypass graft: Secondary | ICD-10-CM | POA: Insufficient documentation

## 2012-06-04 DIAGNOSIS — R079 Chest pain, unspecified: Secondary | ICD-10-CM | POA: Insufficient documentation

## 2012-06-04 DIAGNOSIS — I2510 Atherosclerotic heart disease of native coronary artery without angina pectoris: Secondary | ICD-10-CM

## 2012-06-04 DIAGNOSIS — R5381 Other malaise: Secondary | ICD-10-CM | POA: Insufficient documentation

## 2012-06-04 DIAGNOSIS — Z87891 Personal history of nicotine dependence: Secondary | ICD-10-CM | POA: Insufficient documentation

## 2012-06-04 MED ORDER — TECHNETIUM TC 99M SESTAMIBI GENERIC - CARDIOLITE
30.0000 | Freq: Once | INTRAVENOUS | Status: AC | PRN
  Administered 2012-06-04: 30 via INTRAVENOUS

## 2012-06-04 MED ORDER — TECHNETIUM TC 99M SESTAMIBI GENERIC - CARDIOLITE
10.0000 | Freq: Once | INTRAVENOUS | Status: AC | PRN
  Administered 2012-06-04: 10 via INTRAVENOUS

## 2012-06-04 NOTE — Progress Notes (Signed)
[facility] SITE 3 NUCLEAR MED [address] [place], [place] [address] [phone]    Cardiology Nuclear Med Study  [name] is a 67 y.o. male     MRN : [id number]     DOB: 19-May-1945  Procedure Date: 06/04/2012  Nuclear Med Background Indication for Stress Test:  Evaluation for Ischemia and Graft Patency History:  '11 MI>CABG, EF=50%; '11 Echo:EF=55-60%; TAA 3.9 cm Cardiac Risk Factors: Carotid Disease, History of Smoking, Hypertension and Lipids  Symptoms:  Chest Pain (last episode of chest discomfort was this am, 1/10; none now), Fatigue and Palpitations   Nuclear Pre-Procedure Caffeine/Decaff Intake:  None NPO After: 7:30am   Lungs:  Clear. O2 Sat: 98% on room air. IV 0.9% NS with Angio Cath:  18g  IV Site: R Antecubital  IV Started by:  Stanton Kidney, EMT-P  Chest Size (in):  42 Cup Size: n/a  Height: 5\' 10"  (1.778 m)  Weight:  195 lb (88.451 kg)  BMI:  Body mass index is 27.98 kg/(m^2). Tech Comments:  Lopressor held > 24 hrs, per patient.    Nuclear Med Study 1 or 2 day study: 1 day  Stress Test Type:  Stress  Reading MD: Dietrich Pates, MD  Order Authorizing Provider:  Verne Carrow, MD  Resting Radionuclide: Technetium 98m Sestamibi  Resting Radionuclide Dose: 11.0 mCi   Stress Radionuclide:  Technetium 78m Sestamibi  Stress Radionuclide Dose: 33.0 mCi           Stress Protocol Rest HR: 83 Stress HR: 139  Rest BP: 146/74 Stress BP: 201/71  Exercise Time (min): 6:46 METS: 7.8   Predicted Max HR: 152 bpm % Max HR: 91.45 bpm Rate Pressure Product: 21308   Dose of Adenosine (mg):  n/a Dose of Lexiscan: n/a mg  Dose of Atropine (mg): n/a Dose of Dobutamine: n/a mcg/kg/min (at max HR)  Stress Test Technologist: Smiley Houseman, CMA-N  Nuclear Technologist:  Domenic Polite, CNMT     Rest Procedure:  Myocardial perfusion imaging was performed at rest 45 minutes following the intravenous administration of Technetium 66m Sestamibi.  Rest  ECG:  Normal sinus rhythm  Stress Procedure:  The patient exercised on the treadmill utilizing the Bruce Protocol for 6:46 minutes. The patient stopped due to fatigue and denied any chest pain; he did c/o a "quick" pain in his left arm at peak exercise.  Technetium 21m Sestamibi was injected at peak exercise and myocardial perfusion imaging was performed after a brief delay.  Stress ECG: No signif ST T wave changes to suggest ischemia  3 Beats NSVT  QPS Raw Data Images:  Soft tissue (diaphragm, bowel activity) underlie heart. Stress Images:   Normal homogeneous uptake.Marland Kitchen Rest Images:  Normal homogeneous uptake in all areas of the myocardium. Subtraction (SDS):  No evidence of ischemia. Transient Ischemic Dilatation (Normal <1.22):  0.92 Lung/Heart Ratio (Normal <0.45):  0.32  Quantitative Gated Spect Images QGS EDV:  85 ml QGS ESV:  25 ml  Impression Exercise Capacity:  Good exercise capacity. BP Response:  Normal blood pressure response. Clinical Symptoms:  No chest pain. ECG Impression:  No significant ST segment change suggestive of ischemia. Comparison with Prior Nuclear Study: No previous nuclear study performed  Overall Impression:  Normal stress nuclear study.  LV Ejection Fraction: 70%.  LV Wall Motion:  NL LV Function; NL Wall Motion  Dietrich Pates

## 2012-06-05 ENCOUNTER — Encounter (HOSPITAL_COMMUNITY): Payer: Self-pay

## 2012-06-08 ENCOUNTER — Encounter (HOSPITAL_COMMUNITY)
Admission: RE | Admit: 2012-06-08 | Discharge: 2012-06-08 | Disposition: A | Discharge status: Home / Self Care | Payer: Self-pay | Source: Ambulatory Visit | Attending: Cardiovascular Disease | Admitting: Cardiovascular Disease

## 2012-06-09 ENCOUNTER — Telehealth: Payer: Self-pay | Admitting: Cardiovascular Disease

## 2012-06-09 NOTE — Telephone Encounter (Signed)
Spoke with pt and reviewed preliminary stress test results with him.  I told him Dr. Clifton James would review when back in office next week and we would call him after he reviewed.

## 2012-06-09 NOTE — Telephone Encounter (Signed)
New problem:  Test results.  

## 2012-06-10 ENCOUNTER — Encounter (HOSPITAL_COMMUNITY)
Admission: RE | Admit: 2012-06-10 | Discharge: 2012-06-10 | Disposition: A | Discharge status: Home / Self Care | Payer: Self-pay | Source: Ambulatory Visit | Attending: Cardiovascular Disease | Admitting: Cardiovascular Disease

## 2012-06-12 ENCOUNTER — Encounter (HOSPITAL_COMMUNITY): Payer: Self-pay

## 2012-06-15 ENCOUNTER — Encounter (HOSPITAL_COMMUNITY): Payer: Self-pay

## 2012-06-17 ENCOUNTER — Encounter (HOSPITAL_COMMUNITY)
Admission: RE | Admit: 2012-06-17 | Discharge: 2012-06-17 | Disposition: A | Discharge status: Home / Self Care | Payer: Self-pay | Source: Ambulatory Visit | Attending: Cardiovascular Disease | Admitting: Cardiovascular Disease

## 2012-06-19 ENCOUNTER — Encounter (HOSPITAL_COMMUNITY)
Admission: RE | Admit: 2012-06-19 | Discharge: 2012-06-19 | Disposition: A | Discharge status: Home / Self Care | Payer: Self-pay | Source: Ambulatory Visit | Attending: Cardiovascular Disease | Admitting: Cardiovascular Disease

## 2012-06-22 ENCOUNTER — Encounter (HOSPITAL_COMMUNITY): Payer: Self-pay

## 2012-06-23 ENCOUNTER — Ambulatory Visit: Payer: Medicare Other | Admitting: Cardiovascular Disease

## 2012-06-24 ENCOUNTER — Encounter (HOSPITAL_COMMUNITY)
Admission: RE | Admit: 2012-06-24 | Discharge: 2012-06-24 | Disposition: A | Discharge status: Home / Self Care | Payer: Self-pay | Source: Ambulatory Visit | Attending: Cardiovascular Disease | Admitting: Cardiovascular Disease

## 2012-06-26 ENCOUNTER — Encounter (HOSPITAL_COMMUNITY)
Admission: RE | Admit: 2012-06-26 | Discharge: 2012-06-26 | Disposition: A | Discharge status: Home / Self Care | Payer: Self-pay | Source: Ambulatory Visit | Attending: Cardiovascular Disease | Admitting: Cardiovascular Disease

## 2012-06-26 DIAGNOSIS — I2510 Atherosclerotic heart disease of native coronary artery without angina pectoris: Secondary | ICD-10-CM | POA: Insufficient documentation

## 2012-06-26 DIAGNOSIS — I10 Essential (primary) hypertension: Secondary | ICD-10-CM | POA: Insufficient documentation

## 2012-06-26 DIAGNOSIS — Z5189 Encounter for other specified aftercare: Secondary | ICD-10-CM | POA: Insufficient documentation

## 2012-06-26 DIAGNOSIS — I252 Old myocardial infarction: Secondary | ICD-10-CM | POA: Insufficient documentation

## 2012-06-26 DIAGNOSIS — Z9861 Coronary angioplasty status: Secondary | ICD-10-CM | POA: Insufficient documentation

## 2012-06-29 ENCOUNTER — Encounter (HOSPITAL_COMMUNITY)
Admission: RE | Admit: 2012-06-29 | Discharge: 2012-06-29 | Disposition: A | Discharge status: Home / Self Care | Payer: Self-pay | Source: Ambulatory Visit | Attending: Cardiovascular Disease | Admitting: Cardiovascular Disease

## 2012-07-01 ENCOUNTER — Encounter (HOSPITAL_COMMUNITY)
Admission: RE | Admit: 2012-07-01 | Discharge: 2012-07-01 | Disposition: A | Discharge status: Home / Self Care | Payer: Self-pay | Source: Ambulatory Visit | Attending: Cardiovascular Disease | Admitting: Cardiovascular Disease

## 2012-07-03 ENCOUNTER — Encounter (HOSPITAL_COMMUNITY): Payer: Self-pay

## 2012-07-06 ENCOUNTER — Encounter (HOSPITAL_COMMUNITY)
Admission: RE | Admit: 2012-07-06 | Discharge: 2012-07-06 | Disposition: A | Discharge status: Home / Self Care | Payer: Self-pay | Source: Ambulatory Visit | Attending: Cardiovascular Disease | Admitting: Cardiovascular Disease

## 2012-07-08 ENCOUNTER — Encounter (HOSPITAL_COMMUNITY): Payer: Self-pay

## 2012-07-10 ENCOUNTER — Encounter (HOSPITAL_COMMUNITY)
Admission: RE | Admit: 2012-07-10 | Discharge: 2012-07-10 | Disposition: A | Discharge status: Home / Self Care | Payer: Self-pay | Source: Ambulatory Visit | Attending: Cardiovascular Disease | Admitting: Cardiovascular Disease

## 2012-07-13 ENCOUNTER — Encounter (HOSPITAL_COMMUNITY): Payer: Self-pay

## 2012-07-15 ENCOUNTER — Encounter (HOSPITAL_COMMUNITY): Payer: Self-pay

## 2012-07-17 ENCOUNTER — Encounter (HOSPITAL_COMMUNITY): Payer: Self-pay

## 2012-07-22 ENCOUNTER — Encounter (HOSPITAL_COMMUNITY)
Admission: RE | Admit: 2012-07-22 | Discharge: 2012-07-22 | Disposition: A | Discharge status: Home / Self Care | Payer: Self-pay | Source: Ambulatory Visit | Attending: Cardiovascular Disease | Admitting: Cardiovascular Disease

## 2012-07-24 ENCOUNTER — Encounter (HOSPITAL_COMMUNITY): Payer: Self-pay

## 2012-07-27 ENCOUNTER — Encounter (HOSPITAL_COMMUNITY): Payer: Self-pay

## 2012-07-27 DIAGNOSIS — I2510 Atherosclerotic heart disease of native coronary artery without angina pectoris: Secondary | ICD-10-CM | POA: Insufficient documentation

## 2012-07-27 DIAGNOSIS — Z5189 Encounter for other specified aftercare: Secondary | ICD-10-CM | POA: Insufficient documentation

## 2012-07-27 DIAGNOSIS — Z9861 Coronary angioplasty status: Secondary | ICD-10-CM | POA: Insufficient documentation

## 2012-07-27 DIAGNOSIS — I252 Old myocardial infarction: Secondary | ICD-10-CM | POA: Insufficient documentation

## 2012-07-27 DIAGNOSIS — I10 Essential (primary) hypertension: Secondary | ICD-10-CM | POA: Insufficient documentation

## 2012-07-29 ENCOUNTER — Encounter (HOSPITAL_COMMUNITY): Payer: Self-pay

## 2012-07-29 ENCOUNTER — Encounter (INDEPENDENT_AMBULATORY_CARE_PROVIDER_SITE_OTHER): Payer: Medicare Other | Admitting: Surgery

## 2012-07-31 ENCOUNTER — Encounter (HOSPITAL_COMMUNITY): Payer: Self-pay

## 2012-08-03 ENCOUNTER — Encounter (HOSPITAL_COMMUNITY)
Admission: RE | Admit: 2012-08-03 | Discharge: 2012-08-03 | Disposition: A | Discharge status: Home / Self Care | Payer: Self-pay | Source: Ambulatory Visit | Attending: Cardiovascular Disease | Admitting: Cardiovascular Disease

## 2012-08-03 ENCOUNTER — Encounter: Payer: Self-pay | Admitting: Cardiovascular Disease

## 2012-08-05 ENCOUNTER — Encounter (HOSPITAL_COMMUNITY)
Admission: RE | Admit: 2012-08-05 | Discharge: 2012-08-05 | Disposition: A | Discharge status: Home / Self Care | Payer: Self-pay | Source: Ambulatory Visit | Attending: Cardiovascular Disease | Admitting: Cardiovascular Disease

## 2012-08-06 ENCOUNTER — Ambulatory Visit (INDEPENDENT_AMBULATORY_CARE_PROVIDER_SITE_OTHER): Payer: Medicare Other | Admitting: Surgery

## 2012-08-06 ENCOUNTER — Encounter (INDEPENDENT_AMBULATORY_CARE_PROVIDER_SITE_OTHER): Payer: Self-pay | Admitting: Surgery

## 2012-08-06 VITALS — BP 132/84 | HR 72 | Temp 98.6°F | Resp 15 | Ht 70.0 in | Wt 196.8 lb

## 2012-08-06 DIAGNOSIS — L723 Sebaceous cyst: Secondary | ICD-10-CM

## 2012-08-06 NOTE — Patient Instructions (Signed)
Epidermal Cyst An epidermal cyst is sometimes called a sebaceous cyst, epidermal inclusion cyst, or infundibular cyst. These cysts usually contain a substance that looks "pasty" or "cheesy" and may have a bad smell. This substance is a protein called keratin. Epidermal cysts are usually found on the face, neck, or trunk. They may also occur in the vaginal area or other parts of the genitalia of both men and women. Epidermal cysts are usually small, painless, slow-growing bumps or lumps that move freely under the skin. It is important not to try to pop them. This may cause an infection and lead to tenderness and swelling. CAUSES  Epidermal cysts may be caused by a deep penetrating injury to the skin or a plugged hair follicle, often associated with acne. SYMPTOMS  Epidermal cysts can become inflamed and cause:  Redness.  Tenderness.  Increased temperature of the skin over the bumps or lumps.  Grayish-white, bad smelling material that drains from the bump or lump. DIAGNOSIS  Epidermal cysts are easily diagnosed by your caregiver during an exam. Rarely, a tissue sample (biopsy) may be taken to rule out other conditions that may resemble epidermal cysts. TREATMENT   Epidermal cysts often get better and disappear on their own. They are rarely ever cancerous.  If a cyst becomes infected, it may become inflamed and tender. This may require opening and draining the cyst. Treatment with antibiotics may be necessary. When the infection is gone, the cyst may be removed with minor surgery.  Small, inflamed cysts can often be treated with antibiotics or by injecting steroid medicines.  Sometimes, epidermal cysts become large and bothersome. If this happens, surgical removal in your caregiver's office may be necessary. HOME CARE INSTRUCTIONS  Only take over-the-counter or prescription medicines as directed by your caregiver.  Take your antibiotics as directed. Finish them even if you start to feel  better. SEEK MEDICAL CARE IF:   Your cyst becomes tender, red, or swollen.  Your condition is not improving or is getting worse.  You have any other questions or concerns. MAKE SURE YOU:  Understand these instructions.  Will watch your condition.  Will get help right away if you are not doing well or get worse. Document Released: 01/13/2004 Document Revised: 05/06/2011 Document Reviewed: 08/20/2010 [facility] Patient Information 2014 [place], [place]. Inguinal Hernia, Adult Muscles help keep everything in the body in its proper place. But if a weak spot in the muscles develops, something can poke through. That is called a hernia. When this happens in the lower part of the belly (abdomen), it is called an inguinal hernia. (It takes its name from a part of the body in this region called the inguinal canal.) A weak spot in the wall of muscles lets some fat or part of the small intestine bulge through. An inguinal hernia can develop at any age. Men get them more often than women. CAUSES  In adults, an inguinal hernia develops over time.  It can be triggered by:  Suddenly straining the muscles of the lower abdomen.  Lifting heavy objects.  Straining to have a bowel movement. Difficult bowel movements (constipation) can lead to this.  Constant coughing. This may be caused by smoking or lung disease.  Being overweight.  Being pregnant.  Working at a job that requires long periods of standing or heavy lifting.  Having had an inguinal hernia before. One type can be an emergency situation. It is called a strangulated inguinal hernia. It develops if part of the small intestine slips  through the weak spot and cannot get back into the abdomen. The blood supply can be cut off. If that happens, part of the intestine may die. This situation requires emergency surgery. SYMPTOMS  Often, a small inguinal hernia has no symptoms. It is found when a healthcare provider does a physical exam. Larger  hernias usually have symptoms.   In adults, symptoms may include:  A lump in the groin. This is easier to see when the person is standing. It might disappear when lying down.  In men, a lump in the scrotum.  Pain or burning in the groin. This occurs especially when lifting, straining or coughing.  A dull ache or feeling of pressure in the groin.  Signs of a strangulated hernia can include:  A bulge in the groin that becomes very painful and tender to the touch.  A bulge that turns red or purple.  Fever, nausea and vomiting.  Inability to have a bowel movement or to pass gas. DIAGNOSIS  To decide if you have an inguinal hernia, a healthcare provider will probably do a physical examination.  This will include asking questions about any symptoms you have noticed.  The healthcare provider might feel the groin area and ask you to cough. If an inguinal hernia is felt, the healthcare provider may try to slide it back into the abdomen.  Usually no other tests are needed. TREATMENT  Treatments can vary. The size of the hernia makes a difference. Options include:  Watchful waiting. This is often suggested if the hernia is small and you have had no symptoms.  No medical procedure will be done unless symptoms develop.  You will need to watch closely for symptoms. If any occur, contact your healthcare provider right away.  Surgery. This is used if the hernia is larger or you have symptoms.  Open surgery. This is usually an outpatient procedure (you will not stay overnight in a hospital). An cut (incision) is made through the skin in the groin. The hernia is put back inside the abdomen. The weak area in the muscles is then repaired by herniorrhaphy or hernioplasty. Herniorrhaphy: in this type of surgery, the weak muscles are sewn back together. Hernioplasty: a patch or mesh is used to close the weak area in the abdominal wall.  Laparoscopy. In this procedure, a surgeon makes small  incisions. A thin tube with a tiny video camera (called a laparoscope) is put into the abdomen. The surgeon repairs the hernia with mesh by looking with the video camera and using two long instruments. HOME CARE INSTRUCTIONS   After surgery to repair an inguinal hernia:  You will need to take pain medicine prescribed by your healthcare provider. Follow all directions carefully.  You will need to take care of the wound from the incision.  Your activity will be restricted for awhile. This will probably include no heavy lifting for several weeks. You also should not do anything too active for a few weeks. When you can return to work will depend on the type of job that you have.  During "watchful waiting" periods, you should:  Maintain a healthy weight.  Eat a diet high in fiber (fruits, vegetables and whole grains).  Drink plenty of fluids to avoid constipation. This means drinking enough water and other liquids to keep your urine clear or pale yellow.  Do not lift heavy objects.  Do not stand for long periods of time.  Quit smoking. This should keep you from developing a frequent cough.  SEEK MEDICAL CARE IF:   A bulge develops in your groin area.  You feel pain, a burning sensation or pressure in the groin. This might be worse if you are lifting or straining.  You develop a fever of more than 100.5 F (38.1 C). SEEK IMMEDIATE MEDICAL CARE IF:   Pain in the groin increases suddenly.  A bulge in the groin gets bigger suddenly and does not go down.  For men, there is sudden pain in the scrotum. Or, the size of the scrotum increases.  A bulge in the groin area becomes red or purple and is painful to touch.  You have nausea or vomiting that does not go away.  You feel your heart beating much faster than normal.  You cannot have a bowel movement or pass gas.  You develop a fever of more than 102.0 F (38.9 C). Document Released: 06/30/2008 Document Revised: 05/06/2011  Document Reviewed: 06/30/2008 [facility] Patient Information 2014 [place], [place].

## 2012-08-06 NOTE — Progress Notes (Signed)
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 male who I saw with a right scrotal hernia about a year ago but he was caring for his wife who had terminal rhabdomyosarcoma. He lives alone and on the second floor. He would like to have the hernia fixed and also this recurrent sebaceous cyst excised. We discussed doing these simultaneously but he would like to do him sequentially. We'll therefore study him to have excision of the sebaceous cyst and then we will subsequently reschedule his right inguinal hernia.  Past Medical History  Diagnosis Date  . 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 Thoracic aortic aneurysm      stable on CT 01/13/10  . Hyperlipidemia   . Gout   . Asthma   . Blood transfusion   . GERD (gastroesophageal reflux disease)   . Hypertension   . Ischemic colitis     noted on CT back in August 2013 possible related to hypotension    Past Surgical History  Procedure Laterality Date  . Coronary artery bypass graft    . Tonsillectomy    . Excision of deep left neck mass. surgeon:  christopher e. newman, m.d.      Current Outpatient Prescriptions  Medication Sig Dispense Refill  . allopurinol (ZYLOPRIM) 300 MG tablet Take 300 mg by mouth daily.      Marland Kitchen aspirin EC 81 MG tablet Take 81 mg by mouth daily.      . Cholecalciferol (VITAMIN D PO) Take 1 tablet by mouth daily.      Marland Kitchen dexlansoprazole (DEXILANT) 60 MG capsule Take 60 mg by mouth daily.       . diphenhydrAMINE (BENADRYL) 12.5 MG/5ML elixir Take 12.5 mg by mouth 4 (four) times daily as needed. As needed for allergies.      . Eszopiclone (LUNESTA PO) Take by mouth at bedtime.      Marland Kitchen HYOSCYAMINE PO Take by mouth as needed.      Marland Kitchen lisinopril (PRINIVIL,ZESTRIL) 20 MG tablet Take 1 tablet (20 mg total) by mouth 2  (two) times daily.  60 tablet  11  . metoprolol tartrate (LOPRESSOR) 25 MG tablet Take 25 mg by mouth 2 (two) times daily.      . Multiple Vitamin (MULTIVITAMIN WITH MINERALS) TABS Take 1 tablet by mouth daily.      Marland Kitchen pyridOXINE (VITAMIN B-6) 100 MG tablet Take 100 mg by mouth daily.      . simvastatin (ZOCOR) 40 MG tablet Take 40 mg by mouth daily.       Marland Kitchen thiamine (VITAMIN B-1) 100 MG tablet Take 100 mg by mouth daily.      . vitamin C (ASCORBIC ACID) 500 MG tablet Take 500 mg by mouth daily.         No current facility-administered medications for this visit.   Review of patient's allergies indicates no known allergies. Family History  Problem Relation Age of Onset  . Heart failure Mother   . Pneumonia Father   . Cancer Sister     lung  . Cancer Maternal Grandmother     pancreatic  . Cancer Sister     esophagus   Social History:   reports that he quit smoking about 38 years ago. He does not have any  smokeless tobacco history on file. He reports that he drinks about 0.6 ounces of alcohol per week. He reports that he does not use illicit drugs.   REVIEW OF SYSTEMS - PERTINENT POSITIVES ONLY: noncontributory  Physical Exam:   Blood pressure 132/84, pulse 72, temperature 98.6 F (37 C), temperature source Temporal, resp. rate 15, height 5\' 10"  (1.778 m), weight 196 lb 12.8 oz (89.268 kg). Body mass index is 28.24 kg/(m^2).  Gen:  WDWN white male NAD  Neurological: Alert and oriented to person, place, and time. Motor and sensory function is grossly intact  Head: Normocephalic and atraumatic.  Eyes: Conjunctivae are normal. Pupils are equal, round, and reactive to light. No scleral icterus.  Neck: Normal range of motion. Neck supple. No tracheal deviation or thyromegaly present.  Cardiovascular:  SR without murmurs or gallops.  No carotid bruits Respiratory: Effort normal.  No respiratory distress. No chest wall tenderness. Breath sounds normal.  No wheezes, rales or rhonchi.   Abdomen:  nontender but with prominent right scrotal hernia. Apparent fatty tissue and left scrotal region and left inguinal region. GU: Musculoskeletal: Normal range of motion. Extremities are nontender. No cyanosis, edema or clubbing noted Lymphadenopathy: No cervical, preauricular, postauricular or axillary adenopathy is present Skin: Skin is warm and dry. No rash noted. No diaphoresis. No erythema. No pallor. Pscyh: Normal mood and affect. Behavior is normal. Judgment and thought content normal.   LABORATORY RESULTS: No results found for this or any previous visit (from the past 48 hour(s)).  RADIOLOGY RESULTS: No results found.  Problem List: Patient Active Problem List   Diagnosis Date Noted  . Chest pain 12/07/2011  . Abdominal  pain, other specified site 12/07/2011  . Other and unspecified noninfectious gastroenteritis and colitis 10/05/2011  . 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 Right inguinal hernia-scrotum 08/23/2011  . Carotid bruit 01/16/2011  . CAD (coronary artery disease) 07/19/2010  . HYPERLIPIDEMIA 01/15/2010  . GOUT 01/15/2010  . HYPERTENSION 01/15/2010  . CAD, ARTERY BYPASS GRAFT 01/15/2010  . THORACIC AORTIC ANEURYSM 01/15/2010    Assessment & Plan: Recurrent sebaceous cyst of left posterior leg/buttock cleft Right scrotal hernia Excised sebaceous cyst first and then schedule open right inguinal hernia repair.    Matt B. Daphine Deutscher, MD, [facility] [phone] beeper [phone]  08/06/2012 9:50 AM

## 2012-08-07 ENCOUNTER — Encounter (HOSPITAL_COMMUNITY)
Admission: RE | Admit: 2012-08-07 | Discharge: 2012-08-07 | Disposition: A | Discharge status: Home / Self Care | Payer: Self-pay | Source: Ambulatory Visit | Attending: Cardiovascular Disease | Admitting: Cardiovascular Disease

## 2012-08-07 ENCOUNTER — Encounter (HOSPITAL_COMMUNITY): Payer: Self-pay

## 2012-08-10 ENCOUNTER — Encounter (HOSPITAL_COMMUNITY): Payer: Self-pay

## 2012-08-12 ENCOUNTER — Encounter (HOSPITAL_COMMUNITY): Payer: Self-pay

## 2012-08-14 ENCOUNTER — Encounter (HOSPITAL_COMMUNITY): Payer: Self-pay

## 2012-08-17 ENCOUNTER — Encounter (HOSPITAL_COMMUNITY): Payer: Self-pay

## 2012-08-19 ENCOUNTER — Encounter (HOSPITAL_COMMUNITY): Payer: Self-pay

## 2012-08-19 ENCOUNTER — Encounter (HOSPITAL_COMMUNITY)
Admission: RE | Admit: 2012-08-19 | Discharge: 2012-08-19 | Disposition: A | Discharge status: Home / Self Care | Payer: Self-pay | Source: Ambulatory Visit | Attending: Cardiovascular Disease | Admitting: Cardiovascular Disease

## 2012-08-21 ENCOUNTER — Encounter (HOSPITAL_COMMUNITY): Payer: Self-pay

## 2012-08-24 ENCOUNTER — Encounter (HOSPITAL_COMMUNITY): Payer: Self-pay

## 2012-08-24 ENCOUNTER — Encounter (HOSPITAL_COMMUNITY)
Admission: RE | Admit: 2012-08-24 | Discharge: 2012-08-24 | Disposition: A | Discharge status: Home / Self Care | Payer: Self-pay | Source: Ambulatory Visit | Attending: Cardiovascular Disease | Admitting: Cardiovascular Disease

## 2012-08-26 ENCOUNTER — Encounter (HOSPITAL_COMMUNITY): Payer: Medicare Other

## 2012-08-26 ENCOUNTER — Encounter (HOSPITAL_COMMUNITY): Payer: Self-pay

## 2012-08-26 DIAGNOSIS — Z5189 Encounter for other specified aftercare: Secondary | ICD-10-CM | POA: Insufficient documentation

## 2012-08-26 DIAGNOSIS — I2510 Atherosclerotic heart disease of native coronary artery without angina pectoris: Secondary | ICD-10-CM | POA: Insufficient documentation

## 2012-08-26 DIAGNOSIS — I10 Essential (primary) hypertension: Secondary | ICD-10-CM | POA: Insufficient documentation

## 2012-08-26 DIAGNOSIS — Z9861 Coronary angioplasty status: Secondary | ICD-10-CM | POA: Insufficient documentation

## 2012-08-26 DIAGNOSIS — I252 Old myocardial infarction: Secondary | ICD-10-CM | POA: Insufficient documentation

## 2012-08-27 ENCOUNTER — Encounter (HOSPITAL_BASED_OUTPATIENT_CLINIC_OR_DEPARTMENT_OTHER): Payer: Self-pay | Admitting: *Deleted

## 2012-08-27 NOTE — Progress Notes (Signed)
Pt had cabg 11/11-had nuc stress test 4/14-recent widowed-was good Had cbc and cmet-not a complete cmet-08/03/12-if anesthesia wants istat dos-they can

## 2012-08-28 ENCOUNTER — Encounter (HOSPITAL_COMMUNITY): Payer: Self-pay

## 2012-08-31 ENCOUNTER — Encounter (INDEPENDENT_AMBULATORY_CARE_PROVIDER_SITE_OTHER): Payer: Self-pay

## 2012-08-31 ENCOUNTER — Encounter (HOSPITAL_COMMUNITY): Payer: Medicare Other

## 2012-08-31 ENCOUNTER — Encounter (HOSPITAL_COMMUNITY)
Admission: RE | Admit: 2012-08-31 | Discharge: 2012-08-31 | Disposition: A | Discharge status: Home / Self Care | Payer: Self-pay | Source: Ambulatory Visit | Attending: Cardiovascular Disease | Admitting: Cardiovascular Disease

## 2012-09-02 ENCOUNTER — Ambulatory Visit (HOSPITAL_BASED_OUTPATIENT_CLINIC_OR_DEPARTMENT_OTHER): Payer: Medicare Other | Admitting: *Deleted

## 2012-09-02 ENCOUNTER — Encounter (HOSPITAL_BASED_OUTPATIENT_CLINIC_OR_DEPARTMENT_OTHER)
Admission: RE | Disposition: A | Discharge status: Home / Self Care | Payer: Self-pay | Source: Ambulatory Visit | Attending: Surgery

## 2012-09-02 ENCOUNTER — Encounter (HOSPITAL_BASED_OUTPATIENT_CLINIC_OR_DEPARTMENT_OTHER): Payer: Self-pay | Admitting: *Deleted

## 2012-09-02 ENCOUNTER — Encounter (HOSPITAL_COMMUNITY): Payer: Medicare Other

## 2012-09-02 ENCOUNTER — Encounter (HOSPITAL_COMMUNITY): Payer: Self-pay

## 2012-09-02 ENCOUNTER — Ambulatory Visit (HOSPITAL_BASED_OUTPATIENT_CLINIC_OR_DEPARTMENT_OTHER)
Admission: RE | Admit: 2012-09-02 | Discharge: 2012-09-02 | Disposition: A | Discharge status: Home / Self Care | Payer: Medicare Other | Source: Ambulatory Visit | Attending: Surgery | Admitting: Surgery

## 2012-09-02 DIAGNOSIS — M109 Gout, unspecified: Secondary | ICD-10-CM | POA: Diagnosis not present

## 2012-09-02 DIAGNOSIS — I252 Old myocardial infarction: Secondary | ICD-10-CM | POA: Diagnosis not present

## 2012-09-02 DIAGNOSIS — Z7982 Long term (current) use of aspirin: Secondary | ICD-10-CM | POA: Diagnosis not present

## 2012-09-02 DIAGNOSIS — E785 Hyperlipidemia, unspecified: Secondary | ICD-10-CM | POA: Insufficient documentation

## 2012-09-02 DIAGNOSIS — Z79899 Other long term (current) drug therapy: Secondary | ICD-10-CM | POA: Insufficient documentation

## 2012-09-02 DIAGNOSIS — J45909 Unspecified asthma, uncomplicated: Secondary | ICD-10-CM | POA: Insufficient documentation

## 2012-09-02 DIAGNOSIS — K5289 Other specified noninfective gastroenteritis and colitis: Secondary | ICD-10-CM | POA: Diagnosis not present

## 2012-09-02 DIAGNOSIS — Z951 Presence of aortocoronary bypass graft: Secondary | ICD-10-CM | POA: Diagnosis not present

## 2012-09-02 DIAGNOSIS — I712 Thoracic aortic aneurysm, without rupture, unspecified: Secondary | ICD-10-CM | POA: Insufficient documentation

## 2012-09-02 DIAGNOSIS — Z87891 Personal history of nicotine dependence: Secondary | ICD-10-CM | POA: Diagnosis not present

## 2012-09-02 DIAGNOSIS — I10 Essential (primary) hypertension: Secondary | ICD-10-CM | POA: Diagnosis not present

## 2012-09-02 DIAGNOSIS — K219 Gastro-esophageal reflux disease without esophagitis: Secondary | ICD-10-CM | POA: Insufficient documentation

## 2012-09-02 DIAGNOSIS — K4090 Unilateral inguinal hernia, without obstruction or gangrene, not specified as recurrent: Secondary | ICD-10-CM | POA: Diagnosis not present

## 2012-09-02 DIAGNOSIS — I2510 Atherosclerotic heart disease of native coronary artery without angina pectoris: Secondary | ICD-10-CM | POA: Diagnosis not present

## 2012-09-02 DIAGNOSIS — L723 Sebaceous cyst: Secondary | ICD-10-CM | POA: Insufficient documentation

## 2012-09-02 HISTORY — PX: EAR CYST EXCISION: SHX22

## 2012-09-02 SURGERY — CYST REMOVAL
Anesthesia: Monitor Anesthesia Care | Site: Leg Upper | Laterality: Left | Wound class: Clean

## 2012-09-02 MED ORDER — FENTANYL CITRATE 0.05 MG/ML IJ SOLN
INTRAMUSCULAR | Status: DC | PRN
  Administered 2012-09-02: 50 ug via INTRAVENOUS

## 2012-09-02 MED ORDER — MIDAZOLAM HCL 5 MG/5ML IJ SOLN
INTRAMUSCULAR | Status: DC | PRN
  Administered 2012-09-02: 1 mg via INTRAVENOUS

## 2012-09-02 MED ORDER — LIDOCAINE HCL (CARDIAC) 20 MG/ML IV SOLN
INTRAVENOUS | Status: DC | PRN
  Administered 2012-09-02: 20 mg via INTRAVENOUS

## 2012-09-02 MED ORDER — HYDROMORPHONE HCL PF 1 MG/ML IJ SOLN: 0.2500 mg | INTRAMUSCULAR | Status: DC | PRN

## 2012-09-02 MED ORDER — OXYCODONE HCL 5 MG PO TABS: 5.0000 mg | ORAL_TABLET | Freq: Once | ORAL | Status: DC | PRN

## 2012-09-02 MED ORDER — ONDANSETRON HCL 4 MG/2ML IJ SOLN: 4.0000 mg | Freq: Once | INTRAMUSCULAR | Status: DC | PRN

## 2012-09-02 MED ORDER — SODIUM CHLORIDE 0.9 % IJ SOLN: 3.0000 mL | INTRAMUSCULAR | Status: DC | PRN

## 2012-09-02 MED ORDER — FENTANYL CITRATE 0.05 MG/ML IJ SOLN: 50.0000 ug | INTRAMUSCULAR | Status: DC | PRN

## 2012-09-02 MED ORDER — 0.9 % SODIUM CHLORIDE (POUR BTL) OPTIME
TOPICAL | Status: DC | PRN
  Administered 2012-09-02: 200 mL

## 2012-09-02 MED ORDER — MEPERIDINE HCL 25 MG/ML IJ SOLN: 6.2500 mg | INTRAMUSCULAR | Status: DC | PRN

## 2012-09-02 MED ORDER — OXYCODONE HCL 5 MG/5ML PO SOLN: 5.0000 mg | Freq: Once | ORAL | Status: DC | PRN

## 2012-09-02 MED ORDER — SODIUM BICARBONATE 4 % IV SOLN
INTRAVENOUS | Status: DC | PRN
  Administered 2012-09-02: 09:00:00 via INTRAMUSCULAR

## 2012-09-02 MED ORDER — PROPOFOL INFUSION 10 MG/ML OPTIME
INTRAVENOUS | Status: DC | PRN
  Administered 2012-09-02: 100 ug/kg/min via INTRAVENOUS

## 2012-09-02 MED ORDER — ONDANSETRON HCL 4 MG/2ML IJ SOLN: 4.0000 mg | Freq: Four times a day (QID) | INTRAMUSCULAR | Status: DC | PRN

## 2012-09-02 MED ORDER — HYDROCODONE-ACETAMINOPHEN 5-325 MG PO TABS: 1.0000 | ORAL_TABLET | ORAL | Status: DC | PRN

## 2012-09-02 MED ORDER — SODIUM CHLORIDE 0.9 % IV SOLN: 250.0000 mL | INTRAVENOUS | Status: DC | PRN

## 2012-09-02 MED ORDER — ACETAMINOPHEN 325 MG PO TABS: 650.0000 mg | ORAL_TABLET | ORAL | Status: DC | PRN

## 2012-09-02 MED ORDER — CEFAZOLIN SODIUM 1-5 GM-% IV SOLN
INTRAVENOUS | Status: DC | PRN
  Administered 2012-09-02: 1 g via INTRAVENOUS

## 2012-09-02 MED ORDER — LACTATED RINGERS IV SOLN
INTRAVENOUS | Status: DC
  Administered 2012-09-02: 08:00:00 via INTRAVENOUS

## 2012-09-02 MED ORDER — MIDAZOLAM HCL 2 MG/2ML IJ SOLN: 1.0000 mg | INTRAMUSCULAR | Status: DC | PRN

## 2012-09-02 MED ORDER — ACETAMINOPHEN 650 MG RE SUPP: 650.0000 mg | RECTAL | Status: DC | PRN

## 2012-09-02 MED ORDER — OXYCODONE HCL 5 MG PO TABS: 5.0000 mg | ORAL_TABLET | ORAL | Status: DC | PRN

## 2012-09-02 MED ORDER — SODIUM CHLORIDE 0.9 % IJ SOLN: 3.0000 mL | Freq: Two times a day (BID) | INTRAMUSCULAR | Status: DC

## 2012-09-02 SURGICAL SUPPLY — 43 items
BENZOIN TINCTURE PRP APPL 2/3 (GAUZE/BANDAGES/DRESSINGS) IMPLANT
BLADE SURG 15 STRL LF DISP TIS (BLADE) ×1 IMPLANT
BLADE SURG 15 STRL SS (BLADE) ×1
BLADE SURG ROTATE 9660 (MISCELLANEOUS) IMPLANT
CANISTER SUCTION 1200CC (MISCELLANEOUS) IMPLANT
CLEANER CAUTERY TIP 5X5 PAD (MISCELLANEOUS) ×1 IMPLANT
CLOTH BEACON ORANGE TIMEOUT ST (SAFETY) ×2 IMPLANT
COVER MAYO STAND STRL (DRAPES) ×2 IMPLANT
COVER TABLE BACK 60X90 (DRAPES) ×2 IMPLANT
DECANTER SPIKE VIAL GLASS SM (MISCELLANEOUS) ×2 IMPLANT
DERMABOND ADVANCED (GAUZE/BANDAGES/DRESSINGS) ×1
DERMABOND ADVANCED .7 DNX12 (GAUZE/BANDAGES/DRESSINGS) ×1 IMPLANT
DRAPE PED LAPAROTOMY (DRAPES) ×2 IMPLANT
DRAPE U-SHAPE 76X120 STRL (DRAPES) IMPLANT
ELECT REM PT RETURN 9FT ADLT (ELECTROSURGICAL) ×2
ELECTRODE REM PT RTRN 9FT ADLT (ELECTROSURGICAL) ×1 IMPLANT
GAUZE SPONGE 4X4 12PLY STRL LF (GAUZE/BANDAGES/DRESSINGS) ×2 IMPLANT
GLOVE BIO SURGEON STRL SZ7 (GLOVE) ×2 IMPLANT
GLOVE BIO SURGEON STRL SZ8 (GLOVE) ×2 IMPLANT
GLOVE BIOGEL PI IND STRL 7.0 (GLOVE) ×1 IMPLANT
GLOVE BIOGEL PI INDICATOR 7.0 (GLOVE) ×1
GLOVE EXAM NITRILE MD LF STRL (GLOVE) ×2 IMPLANT
GOWN PREVENTION PLUS XLARGE (GOWN DISPOSABLE) ×2 IMPLANT
GOWN PREVENTION PLUS XXLARGE (GOWN DISPOSABLE) ×2 IMPLANT
NEEDLE 27GAX1X1/2 (NEEDLE) IMPLANT
NEEDLE HYPO 25X1 1.5 SAFETY (NEEDLE) ×2 IMPLANT
NS IRRIG 1000ML POUR BTL (IV SOLUTION) ×2 IMPLANT
PACK BASIN DAY SURGERY FS (CUSTOM PROCEDURE TRAY) ×2 IMPLANT
PAD CLEANER CAUTERY TIP 5X5 (MISCELLANEOUS) ×1
PENCIL BUTTON HOLSTER BLD 10FT (ELECTRODE) ×2 IMPLANT
STRIP CLOSURE SKIN 1/2X4 (GAUZE/BANDAGES/DRESSINGS) IMPLANT
SUT ETHILON 3 0 FSL (SUTURE) IMPLANT
SUT ETHILON 5 0 PS 2 18 (SUTURE) IMPLANT
SUT VIC AB 4-0 SH 18 (SUTURE) ×2 IMPLANT
SUT VIC AB 5-0 PS2 18 (SUTURE) IMPLANT
SUT VICRYL 3-0 CR8 SH (SUTURE) IMPLANT
SYR BULB 3OZ (MISCELLANEOUS) ×2 IMPLANT
SYR CONTROL 10ML LL (SYRINGE) ×2 IMPLANT
TOWEL OR 17X24 6PK STRL BLUE (TOWEL DISPOSABLE) ×2 IMPLANT
TRAY DSU PREP LF (CUSTOM PROCEDURE TRAY) ×2 IMPLANT
TUBE CONNECTING 20X1/4 (TUBING) IMPLANT
UNDERPAD 30X30 INCONTINENT (UNDERPADS AND DIAPERS) IMPLANT
YANKAUER SUCT BULB TIP NO VENT (SUCTIONS) IMPLANT

## 2012-09-02 NOTE — H&P (View-Only) (Signed)
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 male who I saw with a right scrotal hernia about a year ago but he was caring for his wife who had terminal rhabdomyosarcoma. He lives alone and on the second floor. He would like to have the hernia fixed and also this recurrent sebaceous cyst excised. We discussed doing these simultaneously but he would like to do him sequentially. We'll therefore study him to have excision of the sebaceous cyst and then we will subsequently reschedule his right inguinal hernia.  Past Medical History  Diagnosis Date  . 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 Thoracic aortic aneurysm      stable on CT 01/13/10  . Hyperlipidemia   . Gout   . Asthma   . Blood transfusion   . GERD (gastroesophageal reflux disease)   . Hypertension   . Ischemic colitis     noted on CT back in August 2013 possible related to hypotension    Past Surgical History  Procedure Laterality Date  . Coronary artery bypass graft    . Tonsillectomy    . Excision of deep left neck mass. surgeon:  christopher e. newman, m.d.      Current Outpatient Prescriptions  Medication Sig Dispense Refill  . allopurinol (ZYLOPRIM) 300 MG tablet Take 300 mg by mouth daily.      Marland Kitchen aspirin EC 81 MG tablet Take 81 mg by mouth daily.      . Cholecalciferol (VITAMIN D PO) Take 1 tablet by mouth daily.      Marland Kitchen dexlansoprazole (DEXILANT) 60 MG capsule Take 60 mg by mouth daily.       . diphenhydrAMINE (BENADRYL) 12.5 MG/5ML elixir Take 12.5 mg by mouth 4 (four) times daily as needed. As needed for allergies.      . Eszopiclone (LUNESTA PO) Take by mouth at bedtime.      Marland Kitchen HYOSCYAMINE PO Take by mouth as needed.      Marland Kitchen lisinopril (PRINIVIL,ZESTRIL) 20 MG tablet Take 1 tablet (20 mg total) by mouth 2  (two) times daily.  60 tablet  11  . metoprolol tartrate (LOPRESSOR) 25 MG tablet Take 25 mg by mouth 2 (two) times daily.      . Multiple Vitamin (MULTIVITAMIN WITH MINERALS) TABS Take 1 tablet by mouth daily.      Marland Kitchen pyridOXINE (VITAMIN B-6) 100 MG tablet Take 100 mg by mouth daily.      . simvastatin (ZOCOR) 40 MG tablet Take 40 mg by mouth daily.       Marland Kitchen thiamine (VITAMIN B-1) 100 MG tablet Take 100 mg by mouth daily.      . vitamin C (ASCORBIC ACID) 500 MG tablet Take 500 mg by mouth daily.         No current facility-administered medications for this visit.   Review of patient's allergies indicates no known allergies. Family History  Problem Relation Age of Onset  . Heart failure Mother   . Pneumonia Father   . Cancer Sister     lung  . Cancer Maternal Grandmother     pancreatic  . Cancer Sister     esophagus   Social History:   reports that he quit smoking about 38 years ago. He does not have any  smokeless tobacco history on file. He reports that he drinks about 0.6 ounces of alcohol per week. He reports that he does not use illicit drugs.   REVIEW OF SYSTEMS - PERTINENT POSITIVES ONLY: noncontributory  Physical Exam:   Blood pressure 132/84, pulse 72, temperature 98.6 F (37 C), temperature source Temporal, resp. rate 15, height 5\' 10"  (1.778 m), weight 196 lb 12.8 oz (89.268 kg). Body mass index is 28.24 kg/(m^2).  Gen:  WDWN white male NAD  Neurological: Alert and oriented to person, place, and time. Motor and sensory function is grossly intact  Head: Normocephalic and atraumatic.  Eyes: Conjunctivae are normal. Pupils are equal, round, and reactive to light. No scleral icterus.  Neck: Normal range of motion. Neck supple. No tracheal deviation or thyromegaly present.  Cardiovascular:  SR without murmurs or gallops.  No carotid bruits Respiratory: Effort normal.  No respiratory distress. No chest wall tenderness. Breath sounds normal.  No wheezes, rales or rhonchi.   Abdomen:  nontender but with prominent right scrotal hernia. Apparent fatty tissue and left scrotal region and left inguinal region. GU: Musculoskeletal: Normal range of motion. Extremities are nontender. No cyanosis, edema or clubbing noted Lymphadenopathy: No cervical, preauricular, postauricular or axillary adenopathy is present Skin: Skin is warm and dry. No rash noted. No diaphoresis. No erythema. No pallor. Pscyh: Normal mood and affect. Behavior is normal. Judgment and thought content normal.   LABORATORY RESULTS: No results found for this or any previous visit (from the past 48 hour(s)).  RADIOLOGY RESULTS: No results found.  Problem List: Patient Active Problem List   Diagnosis Date Noted  . Chest pain 12/07/2011  . Abdominal  pain, other specified site 12/07/2011  . Other and unspecified noninfectious gastroenteritis and colitis 10/05/2011  . 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 Right inguinal hernia-scrotum 08/23/2011  . Carotid bruit 01/16/2011  . CAD (coronary artery disease) 07/19/2010  . HYPERLIPIDEMIA 01/15/2010  . GOUT 01/15/2010  . HYPERTENSION 01/15/2010  . CAD, ARTERY BYPASS GRAFT 01/15/2010  . THORACIC AORTIC ANEURYSM 01/15/2010    Assessment & Plan: Recurrent sebaceous cyst of left posterior leg/buttock cleft Right scrotal hernia Excised sebaceous cyst first and then schedule open right inguinal hernia repair.    Matt B. Daphine Deutscher, MD, [facility] [phone] beeper [phone]  08/06/2012 9:50 AM

## 2012-09-02 NOTE — Op Note (Signed)
Surgeon: Wenda Low, MD, FACS  Asst:  none  Anes:  MAC with lidocaine/marcaine/neut  Procedure: Excision of chronic sebaceous cyst from left posterior buttock/leg crease  Diagnosis: Sebaceous cyst  Complications: none  EBL:   minimal cc  Description of Procedure:  Taken to room 5 at [facility].  Prone position.  Marked, prepped and timeout performed.  Infiltrated with local and an elliptical excision was performed. This included the punctum and the site of spontaneous drainage.  The area was irrigated and closed with 4-0 vicryl and Dermabond. The patient received 1 gram of Ancel preop.    Matt B. Daphine Deutscher, MD, [facility], [place] [phone]

## 2012-09-02 NOTE — Anesthesia Preprocedure Evaluation (Addendum)
Anesthesia Evaluation  Patient identified by MRN, date of birth, ID band Patient awake    Reviewed: Allergy & Precautions, H&P , NPO status , Patient's Chart, lab work & pertinent test results  Airway Mallampati: I TM Distance: >3 FB Neck ROM: Full    Dental   Pulmonary          Cardiovascular hypertension, Pt. on medications + CAD, + Past MI and + CABG     Neuro/Psych    GI/Hepatic GERD-  Medicated and Controlled,  Endo/Other    Renal/GU      Musculoskeletal   Abdominal   Peds  Hematology   Anesthesia Other Findings   Reproductive/Obstetrics                           Anesthesia Physical Anesthesia Plan  ASA: III  Anesthesia Plan: MAC   Post-op Pain Management:    Induction: Intravenous  Airway Management Planned: Simple Face Mask  Additional Equipment:   Intra-op Plan:   Post-operative Plan:   Informed Consent: I have reviewed the patients History and Physical, chart, labs and discussed the procedure including the risks, benefits and alternatives for the proposed anesthesia with the patient or authorized representative who has indicated his/her understanding and acceptance.   Dental advisory given and Dental Advisory Given  Plan Discussed with: CRNA and Surgeon  Anesthesia Plan Comments:        Anesthesia Quick Evaluation

## 2012-09-02 NOTE — Anesthesia Procedure Notes (Signed)
Procedure Name: MAC Date/Time: 09/02/2012 8:38 AM Performed by: Meyer Russel Pre-anesthesia Checklist: Patient identified, Emergency Drugs available, Suction available and Patient being monitored Patient Re-evaluated:Patient Re-evaluated prior to inductionOxygen Delivery Method: Simple face mask Preoxygenation: Pre-oxygenation with 100% oxygen Ventilation: Mask ventilation without difficulty Dental Injury: Teeth and Oropharynx as per pre-operative assessment

## 2012-09-02 NOTE — Interval H&P Note (Signed)
History and Physical Interval Note:  09/02/2012 8:15 AM  Samuel Coleman  has presented today for surgery, with the diagnosis of sebaceous cyst  The various methods of treatment have been discussed with the patient and family. After consideration of risks, benefits and other options for treatment, the patient has consented to  Procedure(s): EXCISION SEBACEOUS CYST LEFT POSTERIOR LEG (Left) as a surgical intervention .  The patient's history has been reviewed, patient examined, no change in status, stable for surgery.  I have reviewed the patient's chart and labs.  Site on left leg marked.   Questions were answered to the patient's satisfaction.     Alquan Morrish B

## 2012-09-02 NOTE — Anesthesia Postprocedure Evaluation (Signed)
Anesthesia Post Note  Patient: Samuel Coleman  Procedure(s) Performed: Procedure(s) (LRB): EXCISION SEBACEOUS CYST LEFT POSTERIOR LEG (Left)  Anesthesia type: general  Patient location: PACU  Post pain: Pain level controlled  Post assessment: Patient's Cardiovascular Status Stable  Last Vitals:  Filed Vitals:   09/02/12 1000  BP: 147/86  Pulse: 77  Temp: 36.5 C  Resp: 16    Post vital signs: Reviewed and stable  Level of consciousness: sedated  Complications: No apparent anesthesia complications

## 2012-09-02 NOTE — Transfer of Care (Signed)
Immediate Anesthesia Transfer of Care Note  Patient: Samuel Coleman  Procedure(s) Performed: Procedure(s): EXCISION SEBACEOUS CYST LEFT POSTERIOR LEG (Left)  Patient Location: PACU  Anesthesia Type:MAC  Level of Consciousness: awake, alert  and oriented  Airway & Oxygen Therapy: Patient Spontanous Breathing and Patient connected to face mask oxygen  Post-op Assessment: Report given to PACU RN, Post -op Vital signs reviewed and stable and Patient moving all extremities  Post vital signs: Reviewed and stable  Complications: No apparent anesthesia complications

## 2012-09-04 ENCOUNTER — Encounter (HOSPITAL_COMMUNITY): Payer: Self-pay

## 2012-09-04 ENCOUNTER — Encounter (HOSPITAL_COMMUNITY): Payer: Medicare Other

## 2012-09-04 ENCOUNTER — Encounter (HOSPITAL_BASED_OUTPATIENT_CLINIC_OR_DEPARTMENT_OTHER): Payer: Self-pay | Admitting: Surgery

## 2012-09-07 ENCOUNTER — Encounter (HOSPITAL_COMMUNITY): Payer: Medicare Other

## 2012-09-07 ENCOUNTER — Encounter (HOSPITAL_COMMUNITY): Payer: Self-pay

## 2012-09-09 ENCOUNTER — Encounter (HOSPITAL_COMMUNITY): Payer: Medicare Other

## 2012-09-09 ENCOUNTER — Encounter (HOSPITAL_COMMUNITY)
Admission: RE | Admit: 2012-09-09 | Discharge: 2012-09-09 | Disposition: A | Discharge status: Home / Self Care | Payer: Self-pay | Source: Ambulatory Visit | Attending: Cardiovascular Disease | Admitting: Cardiovascular Disease

## 2012-09-11 ENCOUNTER — Encounter (HOSPITAL_COMMUNITY): Payer: Medicare Other

## 2012-09-11 ENCOUNTER — Encounter (HOSPITAL_COMMUNITY)
Admission: RE | Admit: 2012-09-11 | Discharge: 2012-09-11 | Disposition: A | Discharge status: Home / Self Care | Payer: Self-pay | Source: Ambulatory Visit | Attending: Cardiovascular Disease | Admitting: Cardiovascular Disease

## 2012-09-14 ENCOUNTER — Encounter (HOSPITAL_COMMUNITY)
Admission: RE | Admit: 2012-09-14 | Discharge: 2012-09-14 | Disposition: A | Discharge status: Home / Self Care | Payer: Self-pay | Source: Ambulatory Visit | Attending: Cardiovascular Disease | Admitting: Cardiovascular Disease

## 2012-09-14 ENCOUNTER — Encounter (HOSPITAL_COMMUNITY): Payer: Medicare Other

## 2012-09-16 ENCOUNTER — Encounter (HOSPITAL_COMMUNITY): Payer: Medicare Other

## 2012-09-16 ENCOUNTER — Encounter (HOSPITAL_COMMUNITY)
Admission: RE | Admit: 2012-09-16 | Discharge: 2012-09-16 | Disposition: A | Discharge status: Home / Self Care | Payer: Self-pay | Source: Ambulatory Visit | Attending: Cardiovascular Disease | Admitting: Cardiovascular Disease

## 2012-09-18 ENCOUNTER — Encounter (HOSPITAL_COMMUNITY): Payer: Self-pay

## 2012-09-18 ENCOUNTER — Encounter (HOSPITAL_COMMUNITY): Payer: Medicare Other

## 2012-09-21 ENCOUNTER — Encounter (HOSPITAL_COMMUNITY): Payer: Medicare Other

## 2012-09-21 ENCOUNTER — Encounter (HOSPITAL_COMMUNITY)
Admission: RE | Admit: 2012-09-21 | Discharge: 2012-09-21 | Disposition: A | Discharge status: Home / Self Care | Payer: Self-pay | Source: Ambulatory Visit | Attending: Cardiovascular Disease | Admitting: Cardiovascular Disease

## 2012-09-23 ENCOUNTER — Encounter (HOSPITAL_COMMUNITY): Payer: Medicare Other

## 2012-09-23 ENCOUNTER — Encounter (HOSPITAL_COMMUNITY): Payer: Self-pay

## 2012-09-25 ENCOUNTER — Encounter (HOSPITAL_COMMUNITY): Payer: Medicare Other

## 2012-09-25 DIAGNOSIS — I10 Essential (primary) hypertension: Secondary | ICD-10-CM | POA: Insufficient documentation

## 2012-09-25 DIAGNOSIS — I2510 Atherosclerotic heart disease of native coronary artery without angina pectoris: Secondary | ICD-10-CM | POA: Insufficient documentation

## 2012-09-25 DIAGNOSIS — I252 Old myocardial infarction: Secondary | ICD-10-CM | POA: Insufficient documentation

## 2012-09-25 DIAGNOSIS — Z5189 Encounter for other specified aftercare: Secondary | ICD-10-CM | POA: Insufficient documentation

## 2012-09-25 DIAGNOSIS — Z9861 Coronary angioplasty status: Secondary | ICD-10-CM | POA: Insufficient documentation

## 2012-09-28 ENCOUNTER — Encounter (HOSPITAL_COMMUNITY)
Admission: RE | Admit: 2012-09-28 | Discharge: 2012-09-28 | Disposition: A | Discharge status: Home / Self Care | Payer: Medicare Other | Source: Ambulatory Visit | Attending: Cardiovascular Disease | Admitting: Cardiovascular Disease

## 2012-09-28 ENCOUNTER — Encounter (HOSPITAL_COMMUNITY): Payer: Medicare Other

## 2012-09-30 ENCOUNTER — Other Ambulatory Visit: Payer: Self-pay

## 2012-09-30 ENCOUNTER — Encounter (INDEPENDENT_AMBULATORY_CARE_PROVIDER_SITE_OTHER): Payer: Medicare Other | Admitting: Surgery

## 2012-09-30 ENCOUNTER — Encounter (HOSPITAL_COMMUNITY): Payer: Medicare Other

## 2012-10-02 ENCOUNTER — Encounter (HOSPITAL_COMMUNITY): Payer: Medicare Other

## 2012-10-05 ENCOUNTER — Encounter (HOSPITAL_COMMUNITY): Payer: Medicare Other

## 2012-10-05 ENCOUNTER — Encounter (HOSPITAL_COMMUNITY)
Admission: RE | Admit: 2012-10-05 | Discharge: 2012-10-05 | Disposition: A | Discharge status: Home / Self Care | Payer: Medicare Other | Source: Ambulatory Visit | Attending: Cardiovascular Disease | Admitting: Cardiovascular Disease

## 2012-10-07 ENCOUNTER — Encounter (HOSPITAL_COMMUNITY): Payer: Medicare Other

## 2012-10-07 ENCOUNTER — Encounter (HOSPITAL_COMMUNITY)
Admission: RE | Admit: 2012-10-07 | Discharge: 2012-10-07 | Disposition: A | Discharge status: Home / Self Care | Payer: Medicare Other | Source: Ambulatory Visit | Attending: Cardiovascular Disease | Admitting: Cardiovascular Disease

## 2012-10-08 ENCOUNTER — Ambulatory Visit (INDEPENDENT_AMBULATORY_CARE_PROVIDER_SITE_OTHER): Payer: Medicare Other | Admitting: Surgery

## 2012-10-08 ENCOUNTER — Encounter (INDEPENDENT_AMBULATORY_CARE_PROVIDER_SITE_OTHER): Payer: Self-pay | Admitting: Surgery

## 2012-10-08 VITALS — BP 128/82 | HR 78 | Temp 98.4°F | Resp 14 | Ht 70.0 in | Wt 202.4 lb

## 2012-10-08 DIAGNOSIS — K4090 Unilateral inguinal hernia, without obstruction or gangrene, not specified as recurrent: Secondary | ICD-10-CM

## 2012-10-08 NOTE — Progress Notes (Signed)
Chief Complaint:  Right scrotal hernia  History of Present Illness:  Samuel Coleman is an 67 y.o. male on whom I recently removed an epidermoid cyst from his left buttock crease. This has healed nicely. He reminded me about his right scrotal hernia. He wants to go and get that repaired in October. I suggested that he see Dr. Clifton James at [facility] his cardiologist before we do the surgery. In the meantime we can go ahead and get it set up.  I have given him a book on repair and will keep him overnight because he lives alone.    Past Medical History  Diagnosis Date  . 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 Thoracic aortic aneurysm      stable on CT 01/13/10  . Hyperlipidemia   . Gout   . Asthma   . Blood transfusion   . GERD (gastroesophageal reflux disease)   . Hypertension   . Ischemic colitis     noted on CT back in August 2013 possible related to hypotension  . Myocardial infarction 2011    Past Surgical History  Procedure Laterality Date  . Tonsillectomy    . Excision of deep left neck mass. surgeon:  christopher e. newman, m.d.  1995  . Coronary artery bypass graft  11/11  . Pilonidal cyst drainage    . Cardiac catheterization    . Chest tube insertion  1992    fx lt ribs-collapsed lung  . Eye surgery  both cararacts  . Ear cyst excision Left 09/02/2012    Procedure: EXCISION SEBACEOUS CYST LEFT POSTERIOR LEG;  Surgeon: Valarie Merino, MD;  Location: [facility];  Service: General;  Laterality: Left;    Current Outpatient Prescriptions  Medication Sig Dispense Refill  . allopurinol (ZYLOPRIM) 300 MG tablet Take 300 mg by mouth daily.      Marland Kitchen aspirin EC 81 MG tablet Take 81 mg by mouth daily.      . Cholecalciferol (VITAMIN D PO) Take 1 tablet by mouth daily.      Marland Kitchen dexlansoprazole (DEXILANT) 60 MG capsule Take 60 mg by mouth daily.       . diphenhydrAMINE  (BENADRYL) 12.5 MG/5ML elixir Take 12.5 mg by mouth 4 (four) times daily as needed. As needed for allergies.      Marland Kitchen HYDROcodone-acetaminophen (NORCO/VICODIN) 5-325 MG per tablet Take 1 tablet by mouth every 4 (four) hours as needed for pain.  30 tablet  1  . HYOSCYAMINE PO Take by mouth as needed.      Marland Kitchen lisinopril (PRINIVIL,ZESTRIL) 20 MG tablet Take 1 tablet (20 mg total) by mouth 2 (two) times daily.  60 tablet  11  . metoprolol tartrate (LOPRESSOR) 25 MG tablet Take 25 mg by mouth 2 (two) times daily.      . Multiple Vitamin (MULTIVITAMIN WITH MINERALS) TABS Take 1 tablet by mouth daily.      Marland Kitchen pyridOXINE (VITAMIN B-6) 100 MG tablet Take 100 mg by mouth daily.      . simvastatin (ZOCOR) 40 MG tablet Take 40 mg by mouth every morning.       . thiamine (VITAMIN B-1) 100 MG tablet Take 100 mg by mouth daily.      . vitamin C (ASCORBIC ACID) 500 MG tablet Take 500 mg by mouth daily.         No current  facility-administered medications for this visit.   Review of patient's allergies indicates no known allergies. Family History  Problem Relation Age of Onset  . Heart failure Mother   . Pneumonia Father   . Cancer Sister     lung  . Cancer Maternal Grandmother     pancreatic  . Cancer Sister     esophagus   Social History:   reports that he quit smoking about 38 years ago. He does not have any smokeless tobacco history on file. He reports that he drinks about 0.6 ounces of alcohol per week. He reports that he does not use illicit drugs.   REVIEW OF SYSTEMS - PERTINENT POSITIVES ONLY: Negative for recent chest pain. History of triple bypass surgery. Denies symptoms of stroke or headache. Negative GI problems or evidence of obstruction from his hernia.  Physical Exam:   Blood pressure 128/82, pulse 78, temperature 98.4 F (36.9 C), temperature source Temporal, resp. rate 14, height 5\' 10"  (1.778 m), weight 202 lb 6.4 oz (91.808 kg). Body mass index is 29.04 kg/(m^2).  Gen:  WDWN WM NAD   Neurological: Alert and oriented to person, place, and time. Motor and sensory function is grossly intact  Head: Normocephalic and atraumatic.  Eyes: Conjunctivae are normal. Pupils are equal, round, and reactive to light. No scleral icterus.  Neck: Normal range of motion. Neck supple. No tracheal deviation or thyromegaly present.  Cardiovascular:  SR without murmurs or gallops.  No carotid bruits Respiratory: Effort normal.  No respiratory distress. No chest wall tenderness. Breath sounds normal.  No wheezes, rales or rhonchi.  Abdomen:  Right scrotal hernia GU: Musculoskeletal: Normal range of motion. Extremities are nontender. No cyanosis, edema or clubbing noted Lymphadenopathy: No cervical, preauricular, postauricular or axillary adenopathy is present Skin: Skin is warm and dry. No rash noted. No diaphoresis. No erythema. No pallor. Pscyh: Normal mood and affect. Behavior is normal. Judgment and thought content normal.   LABORATORY RESULTS: No results found for this or any previous visit (from the past 48 hour(s)).  RADIOLOGY RESULTS: No results found.  Problem List: Patient Active Problem List   Diagnosis Date Noted  . Chest pain 12/07/2011  . Abdominal  pain, other specified site 12/07/2011  . Other and unspecified noninfectious gastroenteritis and colitis 10/05/2011  . 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 Right inguinal hernia-scrotum 08/23/2011  . Carotid bruit 01/16/2011  . CAD (coronary artery disease) 07/19/2010  . HYPERLIPIDEMIA 01/15/2010  . GOUT 01/15/2010  . HYPERTENSION 01/15/2010  . CAD, ARTERY BYPASS GRAFT 01/15/2010  . THORACIC AORTIC ANEURYSM 01/15/2010    Assessment & Plan: Right scrotal hernia, symptomatic    Matt B. Daphine Deutscher, MD, [facility] [phone] beeper [phone]  10/08/2012 9:03 AM

## 2012-10-08 NOTE — Patient Instructions (Signed)

## 2012-10-09 ENCOUNTER — Encounter (HOSPITAL_COMMUNITY)
Admission: RE | Admit: 2012-10-09 | Discharge: 2012-10-09 | Disposition: A | Discharge status: Home / Self Care | Payer: Medicare Other | Source: Ambulatory Visit | Attending: Cardiovascular Disease | Admitting: Cardiovascular Disease

## 2012-10-09 ENCOUNTER — Encounter (HOSPITAL_COMMUNITY): Payer: Medicare Other

## 2012-10-12 ENCOUNTER — Encounter (HOSPITAL_COMMUNITY): Payer: Medicare Other

## 2012-10-14 ENCOUNTER — Encounter (HOSPITAL_COMMUNITY): Payer: Medicare Other

## 2012-10-14 ENCOUNTER — Encounter (HOSPITAL_COMMUNITY)
Admission: RE | Admit: 2012-10-14 | Discharge: 2012-10-14 | Disposition: A | Discharge status: Home / Self Care | Payer: Medicare Other | Source: Ambulatory Visit | Attending: Cardiovascular Disease | Admitting: Cardiovascular Disease

## 2012-10-16 ENCOUNTER — Encounter (HOSPITAL_COMMUNITY): Payer: Medicare Other

## 2012-10-16 ENCOUNTER — Encounter (HOSPITAL_COMMUNITY)
Admission: RE | Admit: 2012-10-16 | Discharge: 2012-10-16 | Disposition: A | Discharge status: Home / Self Care | Payer: Medicare Other | Source: Ambulatory Visit | Attending: Cardiovascular Disease | Admitting: Cardiovascular Disease

## 2012-10-19 ENCOUNTER — Encounter (HOSPITAL_COMMUNITY)
Admission: RE | Admit: 2012-10-19 | Discharge: 2012-10-19 | Disposition: A | Discharge status: Home / Self Care | Payer: Medicare Other | Source: Ambulatory Visit | Attending: Cardiovascular Disease | Admitting: Cardiovascular Disease

## 2012-10-19 ENCOUNTER — Encounter (HOSPITAL_COMMUNITY): Payer: Medicare Other

## 2012-10-21 ENCOUNTER — Encounter (HOSPITAL_COMMUNITY): Payer: Medicare Other

## 2012-10-21 ENCOUNTER — Telehealth: Payer: Self-pay | Admitting: Cardiovascular Disease

## 2012-10-21 ENCOUNTER — Encounter (HOSPITAL_COMMUNITY)
Admission: RE | Admit: 2012-10-21 | Discharge: 2012-10-21 | Disposition: A | Discharge status: Home / Self Care | Payer: Medicare Other | Source: Ambulatory Visit | Attending: Cardiovascular Disease | Admitting: Cardiovascular Disease

## 2012-10-21 NOTE — Telephone Encounter (Signed)
Can we ask him to come in for an appt before his surgery? Thanks, chris

## 2012-10-21 NOTE — Telephone Encounter (Signed)
Left message for pt to call back. I have asked him to request to speak with triage nurse as he will need to be worked in to Dr. Gibson Ramp schedule. Possible dates for appt are-September 3 at 2:00, September 4 at 10:45 or September 8 at 9:00

## 2012-10-21 NOTE — Telephone Encounter (Signed)
New Problem  Pt will go into an inguinal hernia operation on 10/8 that requires anesthesia// wants to know that if there is anything that needs to be done before this appt

## 2012-10-22 NOTE — Telephone Encounter (Signed)
Received call from patient - scheduled him for 9/4 @ 1045

## 2012-10-23 ENCOUNTER — Encounter (HOSPITAL_COMMUNITY): Payer: Medicare Other

## 2012-10-26 ENCOUNTER — Encounter (HOSPITAL_COMMUNITY): Payer: Medicare Other

## 2012-10-28 ENCOUNTER — Encounter (HOSPITAL_COMMUNITY): Payer: Medicare Other

## 2012-10-28 DIAGNOSIS — I2510 Atherosclerotic heart disease of native coronary artery without angina pectoris: Secondary | ICD-10-CM | POA: Insufficient documentation

## 2012-10-28 DIAGNOSIS — I252 Old myocardial infarction: Secondary | ICD-10-CM | POA: Insufficient documentation

## 2012-10-28 DIAGNOSIS — Z9861 Coronary angioplasty status: Secondary | ICD-10-CM | POA: Insufficient documentation

## 2012-10-28 DIAGNOSIS — I10 Essential (primary) hypertension: Secondary | ICD-10-CM | POA: Insufficient documentation

## 2012-10-28 DIAGNOSIS — Z5189 Encounter for other specified aftercare: Secondary | ICD-10-CM | POA: Insufficient documentation

## 2012-10-29 ENCOUNTER — Encounter: Payer: Self-pay | Admitting: Cardiovascular Disease

## 2012-10-29 ENCOUNTER — Ambulatory Visit (INDEPENDENT_AMBULATORY_CARE_PROVIDER_SITE_OTHER): Payer: Medicare Other | Admitting: Cardiovascular Disease

## 2012-10-29 VITALS — BP 114/76 | HR 77 | Ht 70.0 in | Wt 203.0 lb

## 2012-10-29 DIAGNOSIS — Z01810 Encounter for preprocedural cardiovascular examination: Secondary | ICD-10-CM

## 2012-10-29 DIAGNOSIS — I2510 Atherosclerotic heart disease of native coronary artery without angina pectoris: Secondary | ICD-10-CM

## 2012-10-29 MED ORDER — NITROGLYCERIN 0.4 MG SL SUBL: 0.4000 mg | SUBLINGUAL_TABLET | SUBLINGUAL | Status: DC | PRN

## 2012-10-29 NOTE — Patient Instructions (Signed)
Your physician wants you to follow-up in:  6 months. You will receive a reminder letter in the mail two months in advance. If you don't receive a letter, please call our office to schedule the follow-up appointment.   

## 2012-10-29 NOTE — Progress Notes (Signed)
History of Present Illness: 67 yo WM with history of CAD, HLD, HTN who is here today for cardiac follow up. He was admitted to [facility] 01/01/10 with an acute inferior STEMI. Emergent cardiac cath after code STEMI called. He was found to have severe three vessel CAD. He underwent 3V CABG later that day with Dr. Laneta Simmers performing. (LIMA to LAD, SVG to OM, SVG to RCA). He was admitted to [facility] August 2013 for abdominal pain and felt to have ischemic colitis and his beta blocker dose was reduced. He also had some bradycardia. He was seen by Norma Fredrickson, NP in January 2014 and was doing well but c/o skipped beats. Event monitor with PVCs, NSR. Stress myoview without ischemia April 2014.   He is here today for follow up. He has plans for a scrotal hernia repair with Dr. Daphine Deutscher. No chest pain, SOB, palpitations. He feels well overall.   Primary Care Physician: Pearson Grippe   Last Lipid Profile: Followed in primary care and well controlled per pt.   Past Medical History  Diagnosis Date  . 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 Thoracic aortic aneurysm      stable on CT 01/13/10  . Hyperlipidemia   . Gout   . Asthma   . Blood transfusion   . GERD (gastroesophageal reflux disease)   . Hypertension   . Ischemic colitis     noted on CT back in August 2013 possible related to hypotension  . Myocardial infarction 2011    Past Surgical History  Procedure Laterality Date  . Tonsillectomy    . Excision of deep left neck mass. surgeon:  Elye Harmsen e. newman, m.d.  1995  . Coronary artery bypass graft  11/11  . Pilonidal cyst drainage    . Cardiac catheterization    . Chest tube insertion  1992    fx lt ribs-collapsed lung  . Eye surgery  both cararacts  . Ear cyst excision Left 09/02/2012    Procedure: EXCISION SEBACEOUS CYST LEFT POSTERIOR LEG;  Surgeon: Valarie Merino, MD;  Location: [facility];  Service: General;  Laterality: Left;    Current Outpatient Prescriptions  Medication Sig Dispense Refill  . allopurinol (ZYLOPRIM) 300 MG tablet Take 300 mg by mouth daily.      Marland Kitchen aspirin EC 81 MG tablet Take 81 mg by mouth daily.      . Cholecalciferol (VITAMIN D PO) Take 1 tablet by mouth daily.      Marland Kitchen dexlansoprazole (DEXILANT) 60 MG capsule Take 60 mg by mouth daily.       . diphenhydrAMINE (BENADRYL) 12.5 MG/5ML elixir Take 12.5 mg by mouth 4 (four) times daily as needed. As needed for allergies.      Marland Kitchen HYDROcodone-acetaminophen (NORCO/VICODIN) 5-325 MG per tablet Take 1 tablet by mouth every 4 (four) hours as needed for pain.  30 tablet  1  . HYOSCYAMINE PO Take by mouth as needed.      Marland Kitchen lisinopril (PRINIVIL,ZESTRIL) 20 MG tablet Take 1 tablet (20 mg total) by mouth 2 (two) times daily.  60 tablet  11  . metoprolol tartrate (LOPRESSOR) 25 MG tablet Take 25 mg by mouth 2 (two) times daily.      . simvastatin (ZOCOR) 40 MG tablet Take 40 mg by mouth every morning.       . thiamine (VITAMIN  B-1) 100 MG tablet Take 100 mg by mouth daily.       No current facility-administered medications for this visit.    No Known Allergies  History   Social History  . Marital Status: Widowed    Spouse Name: N/A    Number of Children: N/A  . Years of Education: N/A   Occupational History  . Not on file.   Social History Main Topics  . Smoking status: Former Smoker    Quit date: 02/25/1974  . Smokeless tobacco: Not on file  . Alcohol Use: 0.6 oz/week    1 Glasses of wine per week     Comment: weekly  . Drug Use: No  . Sexual Activity: Not on file   Other Topics Concern  . Not on file   Social History Narrative  . No narrative on file    Family History  Problem Relation Age of Onset  . Heart failure Mother   . Pneumonia Father   . Cancer Sister     lung  . Cancer Maternal Grandmother     pancreatic  . Cancer Sister     esophagus    Review of Systems:   As stated in the HPI and otherwise negative.   BP 114/76  Pulse 77  Ht 5\' 10"  (1.778 m)  Wt 203 lb (92.08 kg)  BMI 29.13 kg/m2  Physical Examination: General: Well developed, well nourished, NAD HEENT: OP clear, mucus membranes moist SKIN: warm, dry. No rashes. Neuro: No focal deficits Musculoskeletal: Muscle strength 5/5 all ext Psychiatric: Mood and affect normal Neck: No JVD, no carotid bruits, no thyromegaly, no lymphadenopathy. Lungs:Clear bilaterally, no wheezes, rhonci, crackles Cardiovascular: Regular rate and rhythm. No murmurs, gallops or rubs. Abdomen:Soft. Bowel sounds present. Non-tender.  Extremities: No lower extremity edema. Pulses are 2 + in the bilateral DP/PT.  EKG: NSR, rate 75 bpm.   Stress myoview April 2014:  Stress Procedure: The patient exercised on the treadmill utilizing the Bruce Protocol for 6:46 minutes. The patient stopped due to fatigue and denied any chest pain; he did c/o a "quick" pain in his left arm at peak exercise. Technetium 90m Sestamibi was injected at peak exercise and myocardial perfusion imaging was performed after a brief delay.  Stress ECG: No signif ST T wave changes to suggest ischemia 3 Beats NSVT  QPS  Raw Data Images: Soft tissue (diaphragm, bowel activity) underlie heart.  Stress Images: Normal homogeneous uptake.Marland Kitchen  Rest Images: Normal homogeneous uptake in all areas of the myocardium.  Subtraction (SDS): No evidence of ischemia.  Transient Ischemic Dilatation (Normal <1.22): 0.92  Lung/Heart Ratio (Normal <0.45): 0.32  Quantitative Gated Spect Images  QGS EDV: 85 ml  QGS ESV: 25 ml  Impression  Exercise Capacity: Good exercise capacity.  BP Response: Normal blood pressure response.  Clinical Symptoms: No chest pain.  ECG Impression: No significant ST segment change suggestive of ischemia.  Comparison with Prior Nuclear Study: No previous nuclear study performed  Overall Impression: Normal stress nuclear study.  LV  Ejection Fraction: 70%. LV Wall Motion: NL LV Function; NL Wall Motion  Assessment and Plan:   1. CAD: Stable. Stress myoview April 2014 without ischemia.  Continue beta blocker, ASA, statin, Ace-inh.   2. HTN: BP controlled. No changes   3. HLD: Continue statin.Lipids followed in primary care.   4. Thoracic aortic aneurysm: CTA chest October 2013 with 3.9 by 3.6 proximal descending thoracic aorta.   5. Palpitations: PVCs on event monitor 2014. Continue  beta blocker.   6. Pre-operative cardiovascular risk assessment/examination: His CAD is stable. BP well controlled. Recent stress test without ischemia. No changes today. He can proceed with surgery without further ischemic testing.

## 2012-10-30 ENCOUNTER — Encounter (HOSPITAL_COMMUNITY): Payer: Medicare Other

## 2012-10-30 ENCOUNTER — Encounter (HOSPITAL_COMMUNITY)
Admission: RE | Admit: 2012-10-30 | Discharge: 2012-10-30 | Disposition: A | Discharge status: Home / Self Care | Payer: Medicare Other | Source: Ambulatory Visit | Attending: Cardiovascular Disease | Admitting: Cardiovascular Disease

## 2012-11-02 ENCOUNTER — Encounter (HOSPITAL_COMMUNITY): Payer: Medicare Other

## 2012-11-02 ENCOUNTER — Encounter (HOSPITAL_COMMUNITY)
Admission: RE | Admit: 2012-11-02 | Discharge: 2012-11-02 | Disposition: A | Discharge status: Home / Self Care | Payer: Medicare Other | Source: Ambulatory Visit | Attending: Cardiovascular Disease | Admitting: Cardiovascular Disease

## 2012-11-04 ENCOUNTER — Encounter (HOSPITAL_COMMUNITY)
Admission: RE | Admit: 2012-11-04 | Discharge: 2012-11-04 | Disposition: A | Discharge status: Home / Self Care | Payer: Medicare Other | Source: Ambulatory Visit | Attending: Cardiovascular Disease | Admitting: Cardiovascular Disease

## 2012-11-04 ENCOUNTER — Encounter (HOSPITAL_COMMUNITY): Payer: Medicare Other

## 2012-11-06 ENCOUNTER — Encounter (HOSPITAL_COMMUNITY): Payer: Medicare Other

## 2012-11-09 ENCOUNTER — Encounter (HOSPITAL_COMMUNITY): Payer: Medicare Other

## 2012-11-11 ENCOUNTER — Encounter (HOSPITAL_COMMUNITY): Payer: Medicare Other

## 2012-11-13 ENCOUNTER — Encounter (HOSPITAL_COMMUNITY)
Admission: RE | Admit: 2012-11-13 | Discharge: 2012-11-13 | Disposition: A | Discharge status: Home / Self Care | Payer: Medicare Other | Source: Ambulatory Visit | Attending: Cardiovascular Disease | Admitting: Cardiovascular Disease

## 2012-11-13 ENCOUNTER — Encounter (HOSPITAL_COMMUNITY): Payer: Medicare Other

## 2012-11-16 ENCOUNTER — Encounter (HOSPITAL_COMMUNITY)
Admission: RE | Admit: 2012-11-16 | Discharge: 2012-11-16 | Disposition: A | Discharge status: Home / Self Care | Payer: Medicare Other | Source: Ambulatory Visit | Attending: Cardiovascular Disease | Admitting: Cardiovascular Disease

## 2012-11-16 ENCOUNTER — Encounter (HOSPITAL_COMMUNITY): Payer: Medicare Other

## 2012-11-18 ENCOUNTER — Encounter (HOSPITAL_COMMUNITY)
Admission: RE | Admit: 2012-11-18 | Discharge: 2012-11-18 | Disposition: A | Discharge status: Home / Self Care | Payer: Medicare Other | Source: Ambulatory Visit | Attending: Cardiovascular Disease | Admitting: Cardiovascular Disease

## 2012-11-18 ENCOUNTER — Encounter (HOSPITAL_COMMUNITY): Payer: Medicare Other

## 2012-11-20 ENCOUNTER — Encounter (HOSPITAL_COMMUNITY)
Admission: RE | Admit: 2012-11-20 | Discharge: 2012-11-20 | Disposition: A | Discharge status: Home / Self Care | Payer: Medicare Other | Source: Ambulatory Visit | Attending: Cardiovascular Disease | Admitting: Cardiovascular Disease

## 2012-11-20 ENCOUNTER — Encounter (HOSPITAL_COMMUNITY): Payer: Medicare Other

## 2012-11-23 ENCOUNTER — Encounter (HOSPITAL_COMMUNITY): Payer: Medicare Other

## 2012-11-23 ENCOUNTER — Encounter (HOSPITAL_COMMUNITY)
Admission: RE | Admit: 2012-11-23 | Discharge: 2012-11-23 | Disposition: A | Discharge status: Home / Self Care | Payer: Medicare Other | Source: Ambulatory Visit | Attending: Cardiovascular Disease | Admitting: Cardiovascular Disease

## 2012-11-24 ENCOUNTER — Encounter (HOSPITAL_COMMUNITY): Payer: Self-pay | Admitting: Pharmacy Technician

## 2012-11-25 ENCOUNTER — Encounter (HOSPITAL_COMMUNITY): Payer: Medicare Other

## 2012-11-25 ENCOUNTER — Other Ambulatory Visit (HOSPITAL_COMMUNITY): Payer: Self-pay | Admitting: *Deleted

## 2012-11-25 DIAGNOSIS — Z5189 Encounter for other specified aftercare: Secondary | ICD-10-CM | POA: Insufficient documentation

## 2012-11-25 DIAGNOSIS — Z9861 Coronary angioplasty status: Secondary | ICD-10-CM | POA: Insufficient documentation

## 2012-11-25 DIAGNOSIS — I252 Old myocardial infarction: Secondary | ICD-10-CM | POA: Insufficient documentation

## 2012-11-25 DIAGNOSIS — I10 Essential (primary) hypertension: Secondary | ICD-10-CM | POA: Insufficient documentation

## 2012-11-25 DIAGNOSIS — I2510 Atherosclerotic heart disease of native coronary artery without angina pectoris: Secondary | ICD-10-CM | POA: Insufficient documentation

## 2012-11-26 ENCOUNTER — Encounter (HOSPITAL_COMMUNITY): Payer: Self-pay

## 2012-11-26 ENCOUNTER — Encounter (HOSPITAL_COMMUNITY)
Admission: RE | Admit: 2012-11-26 | Discharge: 2012-11-26 | Disposition: A | Discharge status: Home / Self Care | Payer: Medicare Other | Source: Ambulatory Visit | Attending: Surgery | Admitting: Surgery

## 2012-11-26 DIAGNOSIS — Z01818 Encounter for other preprocedural examination: Secondary | ICD-10-CM | POA: Insufficient documentation

## 2012-11-26 DIAGNOSIS — Z01812 Encounter for preprocedural laboratory examination: Secondary | ICD-10-CM | POA: Insufficient documentation

## 2012-11-26 HISTORY — DX: Anxiety disorder, unspecified: F41.9

## 2012-11-26 HISTORY — DX: Dorsalgia, unspecified: M54.9

## 2012-11-26 HISTORY — DX: Other chronic pain: G89.29

## 2012-11-26 HISTORY — DX: Personal history of urinary calculi: Z87.442

## 2012-11-26 LAB — BASIC METABOLIC PANEL
CO2: 27 mEq/L (ref 19–32)
Calcium: 10.5 mg/dL (ref 8.4–10.5)
Chloride: 100 mEq/L (ref 96–112)
Glucose, Bld: 84 mg/dL (ref 70–99)
Sodium: 135 mEq/L (ref 135–145)

## 2012-11-26 LAB — CBC
HCT: 50.7 % (ref 39.0–52.0)
MCH: 33.6 pg (ref 26.0–34.0)
MCV: 95.3 fL (ref 78.0–100.0)
Platelets: 173 10*3/uL (ref 150–400)
RBC: 5.32 MIL/uL (ref 4.22–5.81)
WBC: 8.4 10*3/uL (ref 4.0–10.5)

## 2012-11-26 NOTE — Progress Notes (Signed)
11/26/12 0902  OBSTRUCTIVE SLEEP APNEA  Have you ever been diagnosed with sleep apnea through a sleep study? No  Do you snore loudly (loud enough to be heard through closed doors)?  1  Do you often feel tired, fatigued, or sleepy during the daytime? 1  Has anyone observed you stop breathing during your sleep? 0  Do you have, or are you being treated for high blood pressure? 1  BMI more than 35 kg/m2? 0  Age over 69 years old? 1  Neck circumference greater than 40 cm/18 inches? 0  Gender: 1  Obstructive Sleep Apnea Score 5  Score 4 or greater  Results sent to PCP

## 2012-11-26 NOTE — Patient Instructions (Signed)
Samuel Coleman  11/26/2012                           YOUR PROCEDURE IS SCHEDULED ON:  12/02/12               PLEASE REPORT TO SHORT STAY CENTER AT :  9:00 AM               CALL THIS NUMBER IF ANY PROBLEMS THE DAY OF SURGERY :               [phone]                      REMEMBER:   Do not eat food or drink liquids AFTER MIDNIGHT   Take these medicines the morning of surgery with A SIP OF WATER:  ALLOPURINOL / DEXILANT / METOPROLOL / SIMVASTATIN   Do not wear jewelry, make-up   Do not wear lotions, powders, or perfumes.   Do not shave legs or underarms 12 hrs. before surgery (men may shave face)  Do not bring valuables to the hospital.  Contacts, dentures or bridgework may not be worn into surgery.  Leave suitcase in the car. After surgery it may be brought to your room.  For patients admitted to the hospital more than one night, checkout time is 11:00                          The day of discharge.   Patients discharged the day of surgery will not be allowed to drive home                             If going home same day of surgery, must have someone stay with you first                           24 hrs at home and arrange for some one to drive you home from hospital.    Special Instructions:   Please read over the following fact sheets that you were given:                1.  PREPARING FOR SURGERY SHEET               2. Use FLEET ENEMA THE NIGHT BEFORE SURGERY               3. STOP ASPIRIN AND HERBAL MEDS 5 DAYS PREOP                                                X_____________________________________________________________________        Failure to follow these instructions may result in cancellation of your surgery

## 2012-11-27 ENCOUNTER — Encounter (HOSPITAL_COMMUNITY): Payer: Medicare Other

## 2012-11-27 ENCOUNTER — Encounter (HOSPITAL_COMMUNITY)
Admission: RE | Admit: 2012-11-27 | Discharge: 2012-11-27 | Disposition: A | Discharge status: Home / Self Care | Payer: Medicare Other | Source: Ambulatory Visit | Attending: Cardiovascular Disease | Admitting: Cardiovascular Disease

## 2012-11-30 ENCOUNTER — Encounter (HOSPITAL_COMMUNITY): Payer: Medicare Other

## 2012-11-30 ENCOUNTER — Encounter (HOSPITAL_COMMUNITY)
Admission: RE | Admit: 2012-11-30 | Discharge: 2012-11-30 | Disposition: A | Discharge status: Home / Self Care | Payer: Medicare Other | Source: Ambulatory Visit | Attending: Cardiovascular Disease | Admitting: Cardiovascular Disease

## 2012-12-02 ENCOUNTER — Ambulatory Visit (HOSPITAL_COMMUNITY): Payer: Medicare Other | Admitting: Certified Registered Nurse Anesthetist

## 2012-12-02 ENCOUNTER — Encounter (HOSPITAL_COMMUNITY)
Admission: RE | Disposition: A | Discharge status: Home / Self Care | Payer: Self-pay | Source: Ambulatory Visit | Attending: Surgery

## 2012-12-02 ENCOUNTER — Encounter (HOSPITAL_COMMUNITY): Payer: Medicare Other | Admitting: Certified Registered Nurse Anesthetist

## 2012-12-02 ENCOUNTER — Encounter (HOSPITAL_COMMUNITY): Payer: Medicare Other

## 2012-12-02 ENCOUNTER — Ambulatory Visit (HOSPITAL_COMMUNITY)
Admission: RE | Admit: 2012-12-02 | Discharge: 2012-12-03 | Disposition: A | Discharge status: Home / Self Care | Payer: Medicare Other | Source: Ambulatory Visit | Attending: Surgery | Admitting: Surgery

## 2012-12-02 ENCOUNTER — Encounter (HOSPITAL_COMMUNITY): Payer: Self-pay | Admitting: *Deleted

## 2012-12-02 DIAGNOSIS — K219 Gastro-esophageal reflux disease without esophagitis: Secondary | ICD-10-CM | POA: Insufficient documentation

## 2012-12-02 DIAGNOSIS — Z7982 Long term (current) use of aspirin: Secondary | ICD-10-CM | POA: Insufficient documentation

## 2012-12-02 DIAGNOSIS — I10 Essential (primary) hypertension: Secondary | ICD-10-CM | POA: Insufficient documentation

## 2012-12-02 DIAGNOSIS — E785 Hyperlipidemia, unspecified: Secondary | ICD-10-CM | POA: Insufficient documentation

## 2012-12-02 DIAGNOSIS — K4090 Unilateral inguinal hernia, without obstruction or gangrene, not specified as recurrent: Secondary | ICD-10-CM | POA: Insufficient documentation

## 2012-12-02 DIAGNOSIS — I252 Old myocardial infarction: Secondary | ICD-10-CM | POA: Insufficient documentation

## 2012-12-02 DIAGNOSIS — I2510 Atherosclerotic heart disease of native coronary artery without angina pectoris: Secondary | ICD-10-CM | POA: Insufficient documentation

## 2012-12-02 DIAGNOSIS — Z79899 Other long term (current) drug therapy: Secondary | ICD-10-CM | POA: Insufficient documentation

## 2012-12-02 DIAGNOSIS — Z951 Presence of aortocoronary bypass graft: Secondary | ICD-10-CM | POA: Insufficient documentation

## 2012-12-02 HISTORY — PX: INSERTION OF MESH: SHX5868

## 2012-12-02 HISTORY — PX: INGUINAL HERNIA REPAIR: SHX194

## 2012-12-02 LAB — CBC
HCT: 45.9 % (ref 39.0–52.0)
Hemoglobin: 16 g/dL (ref 13.0–17.0)
MCH: 32.9 pg (ref 26.0–34.0)
MCHC: 34.9 g/dL (ref 30.0–36.0)
MCV: 94.3 fL (ref 78.0–100.0)
RDW: 12.8 % (ref 11.5–15.5)
WBC: 15.6 10*3/uL — ABNORMAL HIGH (ref 4.0–10.5)

## 2012-12-02 LAB — CREATININE, SERUM: GFR calc Af Amer: >90 mL/min (ref >90)

## 2012-12-02 SURGERY — REPAIR, HERNIA, INGUINAL, ADULT
Anesthesia: General | Site: Groin | Laterality: Right | Wound class: Clean

## 2012-12-02 MED ORDER — MORPHINE SULFATE 2 MG/ML IJ SOLN
1.0000 mg | INTRAMUSCULAR | Status: DC | PRN
  Administered 2012-12-02: 1 mg via INTRAVENOUS
  Filled 2012-12-02: qty 1

## 2012-12-02 MED ORDER — LIDOCAINE HCL (CARDIAC) 20 MG/ML IV SOLN
INTRAVENOUS | Status: DC | PRN
  Administered 2012-12-02: 50 mg via INTRAVENOUS

## 2012-12-02 MED ORDER — HYDROCODONE-ACETAMINOPHEN 5-325 MG PO TABS
1.0000 | ORAL_TABLET | ORAL | Status: DC | PRN
  Administered 2012-12-02 – 2012-12-03 (×3): 2 via ORAL
  Filled 2012-12-02 (×3): qty 2

## 2012-12-02 MED ORDER — CEFAZOLIN SODIUM-DEXTROSE 2-3 GM-% IV SOLR
INTRAVENOUS | Status: AC
  Filled 2012-12-02: qty 50

## 2012-12-02 MED ORDER — LACTATED RINGERS IV SOLN
INTRAVENOUS | Status: DC
  Administered 2012-12-02 (×2): via INTRAVENOUS
  Administered 2012-12-02: 1000 mL via INTRAVENOUS

## 2012-12-02 MED ORDER — ONDANSETRON HCL 4 MG PO TABS: 4.0000 mg | ORAL_TABLET | Freq: Four times a day (QID) | ORAL | Status: DC | PRN

## 2012-12-02 MED ORDER — BUPIVACAINE HCL (PF) 0.5 % IJ SOLN
INTRAMUSCULAR | Status: DC | PRN
  Administered 2012-12-02: 20 mL

## 2012-12-02 MED ORDER — MIDAZOLAM HCL 5 MG/5ML IJ SOLN
INTRAMUSCULAR | Status: DC | PRN
  Administered 2012-12-02: 2 mg via INTRAVENOUS

## 2012-12-02 MED ORDER — LISINOPRIL 20 MG PO TABS
20.0000 mg | ORAL_TABLET | Freq: Two times a day (BID) | ORAL | Status: DC
  Administered 2012-12-02 – 2012-12-03 (×2): 20 mg via ORAL
  Filled 2012-12-02 (×3): qty 1

## 2012-12-02 MED ORDER — FLEET ENEMA 7-19 GM/118ML RE ENEM: 1.0000 | ENEMA | Freq: Once | RECTAL | Status: DC

## 2012-12-02 MED ORDER — CHLORHEXIDINE GLUCONATE 4 % EX LIQD
1.0000 "application " | Freq: Once | CUTANEOUS | Status: DC
  Filled 2012-12-02: qty 15

## 2012-12-02 MED ORDER — NITROGLYCERIN 0.4 MG SL SUBL: 0.4000 mg | SUBLINGUAL_TABLET | SUBLINGUAL | Status: DC | PRN

## 2012-12-02 MED ORDER — ASPIRIN EC 81 MG PO TBEC
81.0000 mg | DELAYED_RELEASE_TABLET | Freq: Every day | ORAL | Status: DC
  Administered 2012-12-03: 81 mg via ORAL
  Filled 2012-12-02: qty 1

## 2012-12-02 MED ORDER — CEFAZOLIN SODIUM-DEXTROSE 2-3 GM-% IV SOLR
2.0000 g | INTRAVENOUS | Status: AC
  Administered 2012-12-02: 2 g via INTRAVENOUS

## 2012-12-02 MED ORDER — PROPOFOL 10 MG/ML IV BOLUS
INTRAVENOUS | Status: DC | PRN
  Administered 2012-12-02: 160 mg via INTRAVENOUS

## 2012-12-02 MED ORDER — BUPIVACAINE HCL (PF) 0.5 % IJ SOLN
INTRAMUSCULAR | Status: AC
  Filled 2012-12-02: qty 30

## 2012-12-02 MED ORDER — PROMETHAZINE HCL 25 MG/ML IJ SOLN: 6.2500 mg | INTRAMUSCULAR | Status: DC | PRN

## 2012-12-02 MED ORDER — ONDANSETRON HCL 4 MG/2ML IJ SOLN: 4.0000 mg | Freq: Four times a day (QID) | INTRAMUSCULAR | Status: DC | PRN

## 2012-12-02 MED ORDER — KCL IN DEXTROSE-NACL 20-5-0.45 MEQ/L-%-% IV SOLN
INTRAVENOUS | Status: DC
  Filled 2012-12-02 (×2): qty 1000

## 2012-12-02 MED ORDER — FENTANYL CITRATE 0.05 MG/ML IJ SOLN
25.0000 ug | INTRAMUSCULAR | Status: DC | PRN
  Administered 2012-12-02 (×4): 25 ug via INTRAVENOUS

## 2012-12-02 MED ORDER — MEPERIDINE HCL 50 MG/ML IJ SOLN: 6.2500 mg | INTRAMUSCULAR | Status: DC | PRN

## 2012-12-02 MED ORDER — HEPARIN SODIUM (PORCINE) 5000 UNIT/ML IJ SOLN
5000.0000 [IU] | Freq: Three times a day (TID) | INTRAMUSCULAR | Status: DC
  Administered 2012-12-02 – 2012-12-03 (×2): 5000 [IU] via SUBCUTANEOUS
  Filled 2012-12-02 (×5): qty 1

## 2012-12-02 MED ORDER — METOPROLOL TARTRATE 25 MG PO TABS
25.0000 mg | ORAL_TABLET | Freq: Two times a day (BID) | ORAL | Status: DC
  Administered 2012-12-02 – 2012-12-03 (×2): 25 mg via ORAL
  Filled 2012-12-02 (×3): qty 1

## 2012-12-02 MED ORDER — ONDANSETRON HCL 4 MG/2ML IJ SOLN
INTRAMUSCULAR | Status: DC | PRN
  Administered 2012-12-02: 4 mg via INTRAMUSCULAR

## 2012-12-02 MED ORDER — FENTANYL CITRATE 0.05 MG/ML IJ SOLN
INTRAMUSCULAR | Status: AC
  Filled 2012-12-02: qty 2

## 2012-12-02 MED ORDER — EPHEDRINE SULFATE 50 MG/ML IJ SOLN
INTRAMUSCULAR | Status: DC | PRN
  Administered 2012-12-02 (×2): 10 mg via INTRAVENOUS

## 2012-12-02 MED ORDER — HEPARIN SODIUM (PORCINE) 5000 UNIT/ML IJ SOLN
5000.0000 [IU] | Freq: Once | INTRAMUSCULAR | Status: AC
  Administered 2012-12-02: 5000 [IU] via SUBCUTANEOUS
  Filled 2012-12-02: qty 1

## 2012-12-02 MED ORDER — FENTANYL CITRATE 0.05 MG/ML IJ SOLN
INTRAMUSCULAR | Status: DC | PRN
  Administered 2012-12-02 (×5): 50 ug via INTRAVENOUS

## 2012-12-02 SURGICAL SUPPLY — 40 items
BENZOIN TINCTURE PRP APPL 2/3 (GAUZE/BANDAGES/DRESSINGS) IMPLANT
BLADE HEX COATED 2.75 (ELECTRODE) ×2 IMPLANT
BLADE SURG 15 STRL LF DISP TIS (BLADE) ×1 IMPLANT
BLADE SURG 15 STRL SS (BLADE) ×1
BLADE SURG SZ10 CARB STEEL (BLADE) ×2 IMPLANT
CANISTER SUCTION 2500CC (MISCELLANEOUS) ×2 IMPLANT
CLOTH BEACON ORANGE TIMEOUT ST (SAFETY) ×2 IMPLANT
DECANTER SPIKE VIAL GLASS SM (MISCELLANEOUS) ×2 IMPLANT
DISSECTOR ROUND CHERRY 3/8 STR (MISCELLANEOUS) IMPLANT
DRAIN PENROSE 18X1/2 LTX STRL (DRAIN) ×2 IMPLANT
DRAPE LAPAROTOMY TRNSV 102X78 (DRAPE) ×2 IMPLANT
ELECT REM PT RETURN 9FT ADLT (ELECTROSURGICAL) ×2
ELECTRODE REM PT RTRN 9FT ADLT (ELECTROSURGICAL) ×1 IMPLANT
GLOVE BIOGEL M 8.0 STRL (GLOVE) ×2 IMPLANT
GOWN STRL REIN XL XLG (GOWN DISPOSABLE) ×6 IMPLANT
KIT BASIN OR (CUSTOM PROCEDURE TRAY) ×2 IMPLANT
MESH HERNIA 3X6 (Mesh General) ×2 IMPLANT
NEEDLE HYPO 25X1 1.5 SAFETY (NEEDLE) ×2 IMPLANT
NS IRRIG 1000ML POUR BTL (IV SOLUTION) ×2 IMPLANT
PACK BASIC VI WITH GOWN DISP (CUSTOM PROCEDURE TRAY) ×2 IMPLANT
PENCIL BUTTON HOLSTER BLD 10FT (ELECTRODE) ×2 IMPLANT
SPONGE GAUZE 4X4 12PLY (GAUZE/BANDAGES/DRESSINGS) IMPLANT
SPONGE LAP 4X18 X RAY DECT (DISPOSABLE) ×2 IMPLANT
STAPLER VISISTAT 35W (STAPLE) IMPLANT
STRIP CLOSURE SKIN 1/2X4 (GAUZE/BANDAGES/DRESSINGS) IMPLANT
SUT MON AB 5-0 PS2 18 (SUTURE) ×2 IMPLANT
SUT PROLENE 2 0 CT2 30 (SUTURE) ×4 IMPLANT
SUT SILK 2 0 (SUTURE) ×2
SUT SILK 2 0 SH (SUTURE) ×2 IMPLANT
SUT SILK 2 0 SH CR/8 (SUTURE) IMPLANT
SUT SILK 2-0 18XBRD TIE 12 (SUTURE) ×1 IMPLANT
SUT SILK 2-0 30XBRD TIE 12 (SUTURE) ×1 IMPLANT
SUT VIC AB 2-0 SH 27 (SUTURE) ×1
SUT VIC AB 2-0 SH 27X BRD (SUTURE) ×1 IMPLANT
SUT VIC AB 4-0 SH 18 (SUTURE) ×4 IMPLANT
SYR 20CC LL (SYRINGE) ×2 IMPLANT
SYR BULB IRRIGATION 50ML (SYRINGE) ×2 IMPLANT
TOWEL OR 17X26 10 PK STRL BLUE (TOWEL DISPOSABLE) ×2 IMPLANT
TOWEL OR NON WOVEN STRL DISP B (DISPOSABLE) ×2 IMPLANT
YANKAUER SUCT BULB TIP 10FT TU (MISCELLANEOUS) ×2 IMPLANT

## 2012-12-02 NOTE — Anesthesia Preprocedure Evaluation (Signed)
Anesthesia Evaluation  Patient identified by MRN, date of birth, ID band Patient awake    Reviewed: Allergy & Precautions, H&P , NPO status , Patient's Chart, lab work & pertinent test results  Airway Mallampati: II TM Distance: >3 FB Neck ROM: Full    Dental no notable dental hx.    Pulmonary neg pulmonary ROS, former smoker,  breath sounds clear to auscultation  Pulmonary exam normal       Cardiovascular hypertension, Pt. on medications and Pt. on home beta blockers + CAD, + Past MI, + CABG (2011) and + Peripheral Vascular Disease Rhythm:Regular Rate:Normal  Thoracic aortic aneurysm   Neuro/Psych negative neurological ROS  negative psych ROS   GI/Hepatic Neg liver ROS, GERD-  Medicated and Controlled,  Endo/Other  negative endocrine ROS  Renal/GU negative Renal ROS  negative genitourinary   Musculoskeletal negative musculoskeletal ROS (+)   Abdominal   Peds negative pediatric ROS (+)  Hematology negative hematology ROS (+)   Anesthesia Other Findings   Reproductive/Obstetrics negative OB ROS                           Anesthesia Physical Anesthesia Plan  ASA: III  Anesthesia Plan: General   Post-op Pain Management:    Induction: Intravenous  Airway Management Planned: LMA and Oral ETT  Additional Equipment:   Intra-op Plan:   Post-operative Plan: Extubation in OR  Informed Consent: I have reviewed the patients History and Physical, chart, labs and discussed the procedure including the risks, benefits and alternatives for the proposed anesthesia with the patient or authorized representative who has indicated his/her understanding and acceptance.   Dental advisory given  Plan Discussed with: CRNA  Anesthesia Plan Comments:         Anesthesia Quick Evaluation

## 2012-12-02 NOTE — Transfer of Care (Signed)
Immediate Anesthesia Transfer of Care Note  Patient: Samuel Coleman  Procedure(s) Performed: Procedure(s): OPEN RIGHT INGUINAL HERINA (Right) INSERTION OF MESH (Right)  Patient Location: PACU  Anesthesia Type:General  Level of Consciousness: awake and alert   Airway & Oxygen Therapy: Patient Spontanous Breathing and Patient connected to face mask oxygen  Post-op Assessment: Report given to PACU RN and Post -op Vital signs reviewed and stable  Post vital signs: Reviewed and stable  Complications: No apparent anesthesia complications

## 2012-12-02 NOTE — H&P (Signed)
Chief Complaint: Right scrotal hernia  History of Present Illness: Samuel Coleman is an 67 y.o. male on whom I recently removed an epidermoid cyst from his left buttock crease. This has healed nicely. He reminded me about his right scrotal hernia. He wants to go and get that repaired in October. I suggested that he see Dr. Clifton James at [facility] his cardiologist before we do the surgery. In the meantime we can go ahead and get it set up. I have given him a book on repair and will keep him overnight because he lives alone.  Past Medical History   Diagnosis  Date   .  CAD (coronary artery disease)      prior inferior MI; s/p 3V CABG 01/01/10 (LIMA to LAD, SVG to OM, SVG to RCA- Dr.Bartle)   .  Thoracic aortic aneurysm      stable on CT 01/13/10   .  Hyperlipidemia    .  Gout    .  Asthma    .  Blood transfusion    .  GERD (gastroesophageal reflux disease)    .  Hypertension    .  Ischemic colitis      noted on CT back in August 2013 possible related to hypotension   .  Myocardial infarction  2011    Past Surgical History   Procedure  Laterality  Date   .  Tonsillectomy     .  Excision of deep left neck mass. surgeon: christopher e. newman, m.d.   1995   .  Coronary artery bypass graft   11/11   .  Pilonidal cyst drainage     .  Cardiac catheterization     .  Chest tube insertion   1992     fx lt ribs-collapsed lung   .  Eye surgery   both cararacts   .  Ear cyst excision  Left  09/02/2012     Procedure: EXCISION SEBACEOUS CYST LEFT POSTERIOR LEG; Surgeon: Valarie Merino, MD; Location: [facility]; Service: General; Laterality: Left;    Current Outpatient Prescriptions   Medication  Sig  Dispense  Refill   .  allopurinol (ZYLOPRIM) 300 MG tablet  Take 300 mg by mouth daily.     Marland Kitchen  aspirin EC 81 MG tablet  Take 81 mg by mouth daily.     .  Cholecalciferol (VITAMIN D PO)  Take 1 tablet by mouth daily.     Marland Kitchen  dexlansoprazole (DEXILANT) 60 MG capsule  Take 60  mg by mouth daily.     .  diphenhydrAMINE (BENADRYL) 12.5 MG/5ML elixir  Take 12.5 mg by mouth 4 (four) times daily as needed. As needed for allergies.     Marland Kitchen  HYDROcodone-acetaminophen (NORCO/VICODIN) 5-325 MG per tablet  Take 1 tablet by mouth every 4 (four) hours as needed for pain.  30 tablet  1   .  HYOSCYAMINE PO  Take by mouth as needed.     Marland Kitchen  lisinopril (PRINIVIL,ZESTRIL) 20 MG tablet  Take 1 tablet (20 mg total) by mouth 2 (two) times daily.  60 tablet  11   .  metoprolol tartrate (LOPRESSOR) 25 MG tablet  Take 25 mg by mouth 2 (two) times daily.     .  Multiple Vitamin (MULTIVITAMIN WITH MINERALS) TABS  Take 1 tablet by mouth daily.     Marland Kitchen  pyridOXINE (VITAMIN B-6) 100 MG tablet  Take 100 mg by mouth daily.     Marland Kitchen  simvastatin (ZOCOR) 40 MG tablet  Take 40 mg by mouth every morning.     .  thiamine (VITAMIN B-1) 100 MG tablet  Take 100 mg by mouth daily.     .  vitamin C (ASCORBIC ACID) 500 MG tablet  Take 500 mg by mouth daily.      No current facility-administered medications for this visit.   Review of patient's allergies indicates no known allergies.  Family History   Problem  Relation  Age of Onset   .  Heart failure  Mother    .  Pneumonia  Father    .  Cancer  Sister      lung   .  Cancer  Maternal Grandmother      pancreatic   .  Cancer  Sister      esophagus   Social History: reports that he quit smoking about 38 years ago. He does not have any smokeless tobacco history on file. He reports that he drinks about 0.6 ounces of alcohol per week. He reports that he does not use illicit drugs.  REVIEW OF SYSTEMS - PERTINENT POSITIVES ONLY:  Negative for recent chest pain. History of triple bypass surgery. Denies symptoms of stroke or headache. Negative GI problems or evidence of obstruction from his hernia.  Physical Exam:  Blood pressure 128/82, pulse 78, temperature 98.4 F (36.9 C), temperature source Temporal, resp. rate 14, height 5\' 10"  (1.778 m), weight 202 lb 6.4 oz  (91.808 kg).  Body mass index is 29.04 kg/(m^2).  Gen: WDWN WM NAD  Neurological: Alert and oriented to person, place, and time. Motor and sensory function is grossly intact  Head: Normocephalic and atraumatic.  Eyes: Conjunctivae are normal. Pupils are equal, round, and reactive to light. No scleral icterus.  Neck: Normal range of motion. Neck supple. No tracheal deviation or thyromegaly present.  Cardiovascular: SR without murmurs or gallops. No carotid bruits  Respiratory: Effort normal. No respiratory distress. No chest wall tenderness. Breath sounds normal. No wheezes, rales or rhonchi.  Abdomen: Right scrotal hernia  GU:  Musculoskeletal: Normal range of motion. Extremities are nontender. No cyanosis, edema or clubbing noted Lymphadenopathy: No cervical, preauricular, postauricular or axillary adenopathy is present Skin: Skin is warm and dry. No rash noted. No diaphoresis. No erythema. No pallor. Pscyh: Normal mood and affect. Behavior is normal. Judgment and thought content normal.  LABORATORY RESULTS:  No results found for this or any previous visit (from the past 48 hour(s)).  RADIOLOGY RESULTS:  No results found.  Problem List:  Patient Active Problem List    Diagnosis  Date Noted   .  Chest pain  12/07/2011   .  Abdominal pain, other specified site  12/07/2011   .  Other and unspecified noninfectious gastroenteritis and colitis  10/05/2011   .  Ischemic bowel disease  10/04/2011   .  Right inguinal hernia-scrotum  08/23/2011   .  Carotid bruit  01/16/2011   .  CAD (coronary artery disease)  07/19/2010   .  HYPERLIPIDEMIA  01/15/2010   .  GOUT  01/15/2010   .  HYPERTENSION  01/15/2010   .  CAD, ARTERY BYPASS GRAFT  01/15/2010   .  THORACIC AORTIC ANEURYSM  01/15/2010   Assessment & Plan:  Right scrotal hernia, symptomatic  Matt B. Daphine Deutscher, MD, [facility]  [phone] beeper  [phone]

## 2012-12-02 NOTE — Op Note (Signed)
Surgeon: Wenda Low, MD, FACS  Asst:  none  Anes:  Gen.  Procedure: Open right inguinal hernia repair with Marlex type mesh  Diagnosis: Right scrotal hernia,ndirect  Complications: none  EBL:   10  cc  Description of Procedure:  The patient was taken to room 1 and given general via LMA. Scrotum and inguinal region were shaved and prepped with PCMX and draped sterilely. I made an oblique incision on the right side and carried this down through a generous panniculus to the external ring which was distorted by this large hernia that was going down into his scrotum. I opened the external ring preserving the nerves and then mobilized this hernia and the cord. First I dissected the sac down to where free it from the testicle left testicle down in the scrotum. In the proximal open the cremasteric fibers and found a hernia sac and separated from the inguinal canal structures including the vas and the testicular vessels. This is a large sac about 6 inches in height went ahead and dissected up and expose the neck. Neck was sewn off with a pursestring of 2-0 silk the excess sac was amputated. This was allowed to return to the abdomen.  The internal ring was then snugged up with 2 sutures of Prolene carefully placed feeling the inguinal ligament and feeling the more medial margin of the floor. The snugged up the ring considerably.  Next I cut a piece of mesh to fit the floor and sutured along the inguinal ligament with a running 2-0 Prolene. It was sutured medially with a running 2-0 Prolene and tacked to itself and tucked beneath the external oblique laterally. The external oblique was closed with interrupted 2-0 Vicryl. The area was infiltrated with Marcaine and then closed in layers of 4-0 Vicryl and then with a running subcuticular 5-0 Monocryl. Dermabond was used on the skin. Patient tolerated procedure well was taken to the recovery room in satisfactory condition.  Matt B. Daphine Deutscher, MD, [facility], [place] [phone]

## 2012-12-02 NOTE — Anesthesia Postprocedure Evaluation (Signed)
  Anesthesia Post-op Note  Patient: Samuel Coleman  Procedure(s) Performed: Procedure(s) (LRB): OPEN RIGHT INGUINAL HERINA (Right) INSERTION OF MESH (Right)  Patient Location: PACU  Anesthesia Type: General  Level of Consciousness: awake and alert   Airway and Oxygen Therapy: Patient Spontanous Breathing  Post-op Pain: mild  Post-op Assessment: Post-op Vital signs reviewed, Patient's Cardiovascular Status Stable, Respiratory Function Stable, Patent Airway and No signs of Nausea or vomiting  Last Vitals:  Filed Vitals:   12/02/12 1650  BP: 148/70  Pulse: 80  Temp: 36.5 C  Resp: 18    Post-op Vital Signs: stable   Complications: No apparent anesthesia complications

## 2012-12-02 NOTE — Interval H&P Note (Signed)
History and Physical Interval Note:  12/02/2012 11:11 AM  Samuel Coleman  has presented today for surgery, with the diagnosis of scrotal hernia  The various methods of treatment have been discussed with the patient and family. After consideration of risks, benefits and other options for treatment, the patient has consented to  Procedure(s): OPEN RIGHT INGUINAL HERINA (Right) as a surgical intervention .  The patient's history has been reviewed, patient examined, no change in status, stable for surgery.  I have reviewed the patient's chart and labs.  I have marked the right side.  Questions were answered to the patient's satisfaction.     Jafet Wissing B

## 2012-12-03 ENCOUNTER — Encounter (HOSPITAL_COMMUNITY): Payer: Self-pay | Admitting: Surgery

## 2012-12-03 LAB — CBC
HCT: 43.6 % (ref 39.0–52.0)
Hemoglobin: 15.1 g/dL (ref 13.0–17.0)
MCHC: 34.6 g/dL (ref 30.0–36.0)
WBC: 13.9 10*3/uL — ABNORMAL HIGH (ref 4.0–10.5)

## 2012-12-03 LAB — BASIC METABOLIC PANEL
BUN: 12 mg/dL (ref 6–23)
Chloride: 101 mEq/L (ref 96–112)
GFR calc non Af Amer: 89 mL/min — ABNORMAL LOW (ref >90)
Glucose, Bld: 134 mg/dL — ABNORMAL HIGH (ref 70–99)
Potassium: 3.8 mEq/L (ref 3.5–5.1)

## 2012-12-03 MED ORDER — HYDROCODONE-ACETAMINOPHEN 5-325 MG PO TABS: 1.0000 | ORAL_TABLET | ORAL | Status: DC | PRN

## 2012-12-04 ENCOUNTER — Encounter (HOSPITAL_COMMUNITY): Payer: Medicare Other

## 2012-12-05 NOTE — Discharge Summary (Signed)
Physician Discharge Summary  Patient ID: Samuel Coleman MRN: [id number] DOB/AGE: 1945/07/27 67 y.o.  Admit date: 12/02/2012 Discharge date: 12/05/2012  Admission Diagnoses:  Right inguinal hernia  Discharge Diagnoses:  same  Active Problems:   * No active hospital problems. *   Surgery:  Open right inguinal hernia  Discharged Condition: improved  Hospital Course:   Had surgery.  Kept overnight for pain management and per patient's concerns. Ready for discharge on PD 1  Consults: none  Significant Diagnostic Studies: none    Discharge Exam: Blood pressure 111/72, pulse 88, temperature 98.1 F (36.7 C), temperature source Oral, resp. rate 18, height 5\' 10"  (1.778 m), weight 204 lb (92.534 kg), SpO2 97.00%. Incisions closed with Dermabond.    Disposition: 01-Home or Self Care  Discharge Orders   Future Appointments Provider Department Dept Phone   12/07/2012 2:45 PM [facility] Re[facility] Maintenance [facility] CARD[facility] [phone]   12/09/2012 2:45 PM Mc-Cardiac Rehab Maintenance [facility] [phone]   12/11/2012 2:45 PM Mc-Cardiac Rehab Maintenance [facility] [phone]   12/14/2012 2:45 PM Mc-Cardiac Rehab Maintenance [facility] [phone]   12/16/2012 9:10 AM Valarie Merino, MD [facility], [place] [phone]   12/16/2012 2:45 PM Mc-Cardiac Rehab Maintenance [facility] [phone]   12/18/2012 2:45 PM Mc-Cardiac Rehab Maintenance [facility] [phone]   12/21/2012 2:45 PM Mc-Cardiac Rehab Maintenance [facility] [phone]   12/23/2012 2:45 PM Mc-Cardiac Rehab Maintenance [facility] C[facility] [phone]   12/25/2012 2:45 PM Mc-Cardiac Rehab Maintenance [facility] CARDIAC [facility] [phone]    12/28/2012 2:45 PM Mc-Cardiac Rehab Maintenance [facility] C[facility] [phone]   12/30/2012 2:45 PM Mc-Cardiac Rehab Maintenance [facility] [phone]   01/01/2013 2:45 PM Mc-Cardiac Rehab Maintenance [facility] CARDI[facility] [phone]   01/04/2013 2:45 PM Mc-Cardiac Rehab Maintenance [facility] [phone]   01/06/2013 2:45 PM Mc-Cardiac Rehab Maintenance [facility] [phone]   01-05-202014 2:45 PM [facility]Cardiac Rehab [facility] [phone]   01/11/2013 2:45 PM Mc-Cardiac Rehab Maintenance [facility] C[facility] [phone]   01/13/2013 2:45 PM Mc-Cardiac Rehab Maintenance [facility] CARDIAC [facility] [phone]   01/15/2013 2:45 PM Mc-Cardiac Rehab Maintenance [facility] CARDIAC [facility] [phone]   01/18/2013 2:45 PM Mc-Cardiac Rehab Maintenance [facility] CARD[facility] [phone]   01/20/2013 2:45 PM Mc-Cardiac Rehab Maintenance [facility] CARDI[facility] [phone]   01/25/2013 2:45 PM Mc-Cardiac Rehab Maintenance [facility] CARDIAC [facility] [phone]   01/27/2013 2:45 PM Mc-Cardiac Rehab Maintenance [facility] CARDIAC [facility] [phone]   01/29/2013 2:45 PM Mc-Cardiac Rehab Maintenance [facility] CARDIAC [facility] [phone]   02/01/2013 2:45 PM Mc-Cardiac Rehab Maintenance [facility] [phone]   02/03/2013 2:45 PM Mc-Cardiac Rehab Maintenance [facility] CARDIAC [facility] [phone]   02/05/2013 2:45 PM Mc-Cardiac Rehab Maintenance [facility] CARDIAC [facility] [phone]   02/08/2013 2:45 PM Mc-Cardiac Rehab Maintenance [name] [phone]   02/10/2013 2:45 PM Mc-Cardiac  Rehab Maintenance [facility] CARDIAC [facility] [phone]   02/12/2013 2:45 PM Mc-Cardiac Rehab Maintenance [facility] CARDIAC [facility] [phone]   02/15/2013 2:45 PM  Mc-Cardiac Rehab Maintenance [facility] CARDI[facility] [phone]   02/17/2013 2:45 PM Mc-Cardiac Rehab Maintenance [facility] CARDIAC [facility] [phone]   02/22/2013 2:45 PM Mc-Cardiac Rehab Maintenance [facility] C[facility] [phone]   02/24/2013 2:45 PM Mc-Cardiac Rehab Maintenance MOSES [facility] CARDIAC [facility] [phone]   02/26/2013 2:45 PM Mc-Cardiac Rehab Maintenance MOS[facility] CARDI[facility] [phone]   03/01/2013 2:45 PM Mc-Cardiac Rehab Maintenance [facility] CARDIAC [facility] [phone]   03/03/2013 2:45 PM Mc-Cardiac Rehab Maintenance [facility] CARDIAC [facility] [phone]   03/05/2013 2:45 PM Mc-Cardiac Rehab Maintenance MOSES [facility] CARDIAC [facility] [phone]   03/08/2013 2:45 PM Mc-Cardiac Rehab Maintenance MOSES [facility] CARDIAC [facility] [phone]   03/10/2013 2:45 PM Mc-Cardiac Rehab Maintenance [facility] CARDI[facility] [phone]   03/12/2013 2:45 PM Mc-Cardiac Rehab Maintenance [facility] CARDI[facility] [phone]   03/15/2013 2:45 PM Mc-Cardiac Rehab Maintenance [facility] [phone]   03/17/2013 2:45 PM Mc-Cardiac Rehab Maintenance [facility] CARDIAC [facility] [phone]   03/19/2013 2:45 PM Mc-Cardiac Rehab Maintenance [facility] CARDIAC [facility] [phone]   03/22/2013 2:45 PM Mc-Cardiac Rehab Maintenance [facility] CARD[facility] [phone]   03/24/2013 2:45 PM Mc-Cardiac Rehab Maintenance [facility] CARDIAC [facility] [phone]   03/26/2013 2:45 PM Mc-Cardiac Rehab Maintenance MOSES [facility] CARDIAC [facility] [phone]   03/29/2013 2:45 PM Mc-Cardiac Rehab Maintenance [facility] CARDI[facility] [phone]   03/31/2013 2:45 PM Mc-Cardiac Rehab Maintenance [facility] [phone]   04/02/2013 2:45 PM Mc-Cardiac Rehab Maintenance [facility] CARDIAC [facility] [phone]   04/05/2013 2:45 PM Mc-Cardiac Rehab Maintenance MOSES [facility] CARDIAC [facility] [phone]   04/07/2013 2:45 PM Mc-Cardiac Rehab Maintenance [facility] CARDIAC [facility] [phone]   04/09/2013 2:45 PM Mc-Cardiac Rehab Maintenance [facility] CARDIAC [facility] [phone]   04/12/2013 2:45 PM Mc-Cardiac Rehab Maintenance MOSES [facility] C[facility] [phone]   04/14/2013 2:45 PM Mc-Cardiac Rehab Maintenance [facility] CARDIAC [facility] [phone]   04/16/2013 2:45 PM Mc-Cardiac Rehab Maintenance [facility]ES [facility] CARDIAC [facility] [phone]   04/19/2013 2:45 PM Mc-Cardiac Rehab Maintenance [facility] CARDI[facility] [phone]   04/21/2013 2:45 PM Mc-Cardiac Rehab Maintenance [facility] CARDIAC [facility] [phone]   04/23/2013 2:45 PM Mc-Cardiac Rehab Maintenance MOSES [facility] CARDIAC [facility] [phone]   04/26/2013 2:45 PM Mc-Cardiac Rehab Maintenance MOS[facility] CARD[facility] [phone]   04/28/2013 2:45 PM Mc-Cardiac Rehab Maintenance MOSES [facility] C[facility] [phone]   04/30/2013 2:45 PM Mc-Cardiac Rehab Maintenance MOSES [facility] CARDIAC [facility] [phone]   05/03/2013 2:45 PM Mc-Cardiac Rehab Maintenance MOSES [facility] [phone]   05/05/2013 2:45 PM Mc-Cardiac Rehab Maintenance MOSES [facility] CARDIAC [facility] [phone]   05/07/2013 2:45 PM Mc-Cardiac Rehab Maintenance MOSES [facility] CARDIAC [facility] [phone]   05/10/2013  2:45 PM Mc-Cardiac Rehab Maintenance MOSES [facility] CARDIAC [facility] [phone]   05/12/2013 2:45 PM Mc-Cardiac Rehab Maintenance [facility] CARDIAC [facility] [phone]   05/14/2013 2:45 PM Mc-Cardiac Rehab Maintenance [facility] CARDIAC [facility] [phone]   Future Orders Complete By Expires   Diet - low sodium heart healthy  As directed    Discharge instructions  As directed    Comments:  May shower when at home Use Miralax and prune juice as needed to avoid constipation   Increase activity slowly  As directed    No wound care  As directed        Medication List         allopurinol 300 MG tablet  Commonly known as:  ZYLOPRIM  Take 300 mg by mouth daily.     aspirin EC 81 MG tablet  Take 81 mg by mouth daily.     cholecalciferol 1000 UNITS tablet  Commonly known as:  VITAMIN D  Take 1,000 Units by mouth daily.     dexlansoprazole 60 MG capsule  Commonly known as:  DEXILANT  Take 60 mg by mouth daily.     diphenhydrAMINE 25 MG tablet  Commonly known as:  SOMINEX  Take 25 mg by mouth at bedtime as needed for sleep.     HYDROcodone-acetaminophen 5-325 MG per tablet  Commonly known as:  NORCO/VICODIN  Take 1-2 tablets by mouth every 4 (four) hours as needed.     lisinopril 20 MG tablet  Commonly known as:  PRINIVIL,ZESTRIL  Take 1 tablet (20 mg total) by mouth 2 (two) times daily.     metoprolol tartrate 25 MG tablet  Commonly known as:  LOPRESSOR  Take 25 mg by mouth 2 (two) times daily.     multivitamin with minerals Tabs tablet  Take 1 tablet by mouth daily.     nitroGLYCERIN 0.4 MG SL tablet  Commonly known as:  NITROSTAT  Place 1 tablet (0.4 mg total) under the tongue every 5 (five) minutes as needed for chest pain.     simvastatin 40 MG tablet  Commonly known as:  ZOCOR  Take 40 mg by mouth every morning.     vitamin B-12 100 MCG tablet  Commonly known as:  CYANOCOBALAMIN  Take 500 mcg by mouth daily.      vitamin C 500 MG tablet  Commonly known as:  ASCORBIC ACID  Take 500 mg by mouth daily.         SignedValarie Merino 12/05/2012, 7:32 AM

## 2012-12-07 ENCOUNTER — Encounter (HOSPITAL_COMMUNITY): Payer: Medicare Other

## 2012-12-09 ENCOUNTER — Encounter (HOSPITAL_COMMUNITY): Payer: Medicare Other

## 2012-12-11 ENCOUNTER — Encounter (HOSPITAL_COMMUNITY): Payer: Medicare Other

## 2012-12-14 ENCOUNTER — Encounter (HOSPITAL_COMMUNITY): Payer: Medicare Other

## 2012-12-16 ENCOUNTER — Encounter (HOSPITAL_COMMUNITY): Payer: Medicare Other

## 2012-12-16 ENCOUNTER — Ambulatory Visit (INDEPENDENT_AMBULATORY_CARE_PROVIDER_SITE_OTHER): Payer: Medicare Other | Admitting: Surgery

## 2012-12-16 ENCOUNTER — Encounter (INDEPENDENT_AMBULATORY_CARE_PROVIDER_SITE_OTHER): Payer: Self-pay | Admitting: Surgery

## 2012-12-16 VITALS — BP 120/74 | HR 76 | Temp 98.8°F | Resp 15 | Ht 70.0 in | Wt 202.2 lb

## 2012-12-16 DIAGNOSIS — Z8719 Personal history of other diseases of the digestive system: Secondary | ICD-10-CM

## 2012-12-16 DIAGNOSIS — Z9889 Other specified postprocedural states: Secondary | ICD-10-CM

## 2012-12-16 NOTE — Patient Instructions (Signed)
Thanks for your patience.  If you need further assistance after leaving the office, please call our office and speak with a [facility] nurse.  [phone].  If you want to leave a message for Dr. Cashay Manganelli, please call his office phone at [phone]. 

## 2012-12-16 NOTE — Progress Notes (Signed)
Tor Milus Glazier 67 y.o.  Body mass index is 29.01 kg/(m^2).  Patient Active Problem List   Diagnosis Date Noted  . Chest pain 12/07/2011  . Abdominal  pain, other specified site 12/07/2011  . Other and unspecified noninfectious gastroenteritis and colitis 10/05/2011  . 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 Right inguinal hernia-scrotum 08/23/2011  . Carotid bruit 01/16/2011  . CAD (coronary artery disease) 07/19/2010  . HYPERLIPIDEMIA 01/15/2010  . GOUT 01/15/2010  . HYPERTENSION 01/15/2010  . CAD, ARTERY BYPASS GRAFT 01/15/2010  . THORACIC AORTIC ANEURYSM 01/15/2010    No Known Allergies  Past Surgical History  Procedure Laterality Date  . Tonsillectomy    . Excision of deep left neck mass. surgeon:  christopher e. newman, m.d.  1995  . Cardiac catheterization    . Chest tube insertion  1992    fx lt ribs-collapsed lung  . Eye surgery  both cararacts  . Ear cyst excision Left 09/02/2012    Procedure: EXCISION SEBACEOUS CYST LEFT POSTERIOR LEG;  Surgeon: Valarie Merino, MD;  Location: [facility];  Service: General;  Laterality: Left;  . Coronary artery bypass graft  11/11    X 3 VESSELS  . Hernia repair    . Inguinal hernia repair Right 12/02/2012    Procedure: OPEN RIGHT INGUINAL HERINA;  Surgeon: Valarie Merino, MD;  Location: [facility];  Service: General;  Laterality: Right;  . Insertion of mesh Right 12/02/2012    Procedure: INSERTION OF MESH;  Surgeon: Valarie Merino, MD;  Location: [facility];  Service: General;  Laterality: Right;   Pearson Grippe, MD No diagnosis found.  Doing well s/p open right inguinal indirect hernia with closure of ring and reinforecement with mesh.  Incision looks good.  Repair intact.  Will see prn.    Matt B. Daphine Deutscher, MD, [facility] [phone] beeper [phone]  12/16/2012 9:28 AM

## 2012-12-18 ENCOUNTER — Encounter (HOSPITAL_COMMUNITY): Payer: Medicare Other

## 2012-12-21 ENCOUNTER — Encounter (HOSPITAL_COMMUNITY): Payer: Medicare Other

## 2012-12-23 ENCOUNTER — Encounter (HOSPITAL_COMMUNITY): Payer: Medicare Other

## 2012-12-25 ENCOUNTER — Encounter (HOSPITAL_COMMUNITY): Payer: Medicare Other

## 2012-12-25 ENCOUNTER — Encounter (HOSPITAL_COMMUNITY)
Admission: RE | Admit: 2012-12-25 | Discharge: 2012-12-25 | Disposition: A | Discharge status: Home / Self Care | Payer: Medicare Other | Source: Ambulatory Visit | Attending: Cardiovascular Disease | Admitting: Cardiovascular Disease

## 2012-12-28 ENCOUNTER — Encounter (HOSPITAL_COMMUNITY): Payer: Medicare Other

## 2012-12-28 DIAGNOSIS — Z9861 Coronary angioplasty status: Secondary | ICD-10-CM | POA: Insufficient documentation

## 2012-12-28 DIAGNOSIS — I252 Old myocardial infarction: Secondary | ICD-10-CM | POA: Insufficient documentation

## 2012-12-28 DIAGNOSIS — Z5189 Encounter for other specified aftercare: Secondary | ICD-10-CM | POA: Insufficient documentation

## 2012-12-28 DIAGNOSIS — I10 Essential (primary) hypertension: Secondary | ICD-10-CM | POA: Insufficient documentation

## 2012-12-28 DIAGNOSIS — I2510 Atherosclerotic heart disease of native coronary artery without angina pectoris: Secondary | ICD-10-CM | POA: Insufficient documentation

## 2012-12-30 ENCOUNTER — Encounter (HOSPITAL_COMMUNITY): Payer: Medicare Other

## 2012-12-30 ENCOUNTER — Encounter (HOSPITAL_COMMUNITY)
Admission: RE | Admit: 2012-12-30 | Discharge: 2012-12-30 | Disposition: A | Discharge status: Home / Self Care | Payer: Medicare Other | Source: Ambulatory Visit | Attending: Cardiovascular Disease | Admitting: Cardiovascular Disease

## 2012-12-31 ENCOUNTER — Other Ambulatory Visit: Payer: Self-pay

## 2013-01-01 ENCOUNTER — Encounter (HOSPITAL_COMMUNITY): Payer: Medicare Other

## 2013-01-04 ENCOUNTER — Encounter (HOSPITAL_COMMUNITY)
Admission: RE | Admit: 2013-01-04 | Discharge: 2013-01-04 | Disposition: A | Discharge status: Home / Self Care | Payer: Medicare Other | Source: Ambulatory Visit | Attending: Cardiovascular Disease | Admitting: Cardiovascular Disease

## 2013-01-04 ENCOUNTER — Encounter (HOSPITAL_COMMUNITY): Payer: Medicare Other

## 2013-01-06 ENCOUNTER — Encounter (HOSPITAL_COMMUNITY): Payer: Medicare Other

## 2013-01-06 ENCOUNTER — Encounter (HOSPITAL_COMMUNITY)
Admission: RE | Admit: 2013-01-06 | Discharge: 2013-01-06 | Disposition: A | Discharge status: Home / Self Care | Payer: Medicare Other | Source: Ambulatory Visit | Attending: Cardiovascular Disease | Admitting: Cardiovascular Disease

## 2013-01-08 ENCOUNTER — Encounter (HOSPITAL_COMMUNITY): Payer: Medicare Other

## 2013-01-11 ENCOUNTER — Encounter (HOSPITAL_COMMUNITY): Payer: Medicare Other

## 2013-01-11 ENCOUNTER — Encounter (HOSPITAL_COMMUNITY)
Admission: RE | Admit: 2013-01-11 | Discharge: 2013-01-11 | Disposition: A | Discharge status: Home / Self Care | Payer: Medicare Other | Source: Ambulatory Visit | Attending: Cardiovascular Disease | Admitting: Cardiovascular Disease

## 2013-01-13 ENCOUNTER — Encounter (HOSPITAL_COMMUNITY): Payer: Medicare Other

## 2013-01-13 ENCOUNTER — Encounter (HOSPITAL_COMMUNITY)
Admission: RE | Admit: 2013-01-13 | Discharge: 2013-01-13 | Disposition: A | Discharge status: Home / Self Care | Payer: Medicare Other | Source: Ambulatory Visit | Attending: Cardiovascular Disease | Admitting: Cardiovascular Disease

## 2013-01-15 ENCOUNTER — Encounter (HOSPITAL_COMMUNITY): Payer: Medicare Other

## 2013-01-18 ENCOUNTER — Encounter (HOSPITAL_COMMUNITY)
Admission: RE | Admit: 2013-01-18 | Discharge: 2013-01-18 | Disposition: A | Discharge status: Home / Self Care | Payer: Medicare Other | Source: Ambulatory Visit | Attending: Cardiovascular Disease | Admitting: Cardiovascular Disease

## 2013-01-18 ENCOUNTER — Encounter (HOSPITAL_COMMUNITY): Payer: Medicare Other

## 2013-01-20 ENCOUNTER — Encounter (HOSPITAL_COMMUNITY): Payer: Medicare Other

## 2013-01-22 ENCOUNTER — Encounter (HOSPITAL_COMMUNITY): Payer: Medicare Other

## 2013-01-25 ENCOUNTER — Encounter (HOSPITAL_COMMUNITY): Payer: Medicare Other

## 2013-01-25 DIAGNOSIS — Z5189 Encounter for other specified aftercare: Secondary | ICD-10-CM | POA: Insufficient documentation

## 2013-01-25 DIAGNOSIS — I252 Old myocardial infarction: Secondary | ICD-10-CM | POA: Insufficient documentation

## 2013-01-25 DIAGNOSIS — I2510 Atherosclerotic heart disease of native coronary artery without angina pectoris: Secondary | ICD-10-CM | POA: Insufficient documentation

## 2013-01-25 DIAGNOSIS — I10 Essential (primary) hypertension: Secondary | ICD-10-CM | POA: Insufficient documentation

## 2013-01-25 DIAGNOSIS — Z9861 Coronary angioplasty status: Secondary | ICD-10-CM | POA: Insufficient documentation

## 2013-01-27 ENCOUNTER — Encounter (HOSPITAL_COMMUNITY): Payer: Medicare Other

## 2013-01-29 ENCOUNTER — Encounter (HOSPITAL_COMMUNITY)
Admission: RE | Admit: 2013-01-29 | Discharge: 2013-01-29 | Disposition: A | Discharge status: Home / Self Care | Payer: Medicare Other | Source: Ambulatory Visit | Attending: Cardiovascular Disease | Admitting: Cardiovascular Disease

## 2013-01-29 ENCOUNTER — Encounter (HOSPITAL_COMMUNITY): Payer: Medicare Other

## 2013-02-01 ENCOUNTER — Encounter (HOSPITAL_COMMUNITY): Payer: Medicare Other

## 2013-02-01 ENCOUNTER — Encounter (HOSPITAL_COMMUNITY)
Admission: RE | Admit: 2013-02-01 | Discharge: 2013-02-01 | Disposition: A | Discharge status: Home / Self Care | Payer: Medicare Other | Source: Ambulatory Visit | Attending: Cardiovascular Disease | Admitting: Cardiovascular Disease

## 2013-02-03 ENCOUNTER — Encounter (HOSPITAL_COMMUNITY): Payer: Medicare Other

## 2013-02-03 ENCOUNTER — Encounter (HOSPITAL_COMMUNITY)
Admission: RE | Admit: 2013-02-03 | Discharge: 2013-02-03 | Disposition: A | Discharge status: Home / Self Care | Payer: Medicare Other | Source: Ambulatory Visit | Attending: Cardiovascular Disease | Admitting: Cardiovascular Disease

## 2013-02-05 ENCOUNTER — Encounter (HOSPITAL_COMMUNITY): Payer: Medicare Other

## 2013-02-05 ENCOUNTER — Encounter (HOSPITAL_COMMUNITY)
Admission: RE | Admit: 2013-02-05 | Discharge: 2013-02-05 | Disposition: A | Discharge status: Home / Self Care | Payer: Medicare Other | Source: Ambulatory Visit | Attending: Cardiovascular Disease | Admitting: Cardiovascular Disease

## 2013-02-08 ENCOUNTER — Encounter (HOSPITAL_COMMUNITY): Payer: Medicare Other

## 2013-02-10 ENCOUNTER — Encounter (HOSPITAL_COMMUNITY): Payer: Medicare Other

## 2013-02-10 ENCOUNTER — Encounter (HOSPITAL_COMMUNITY)
Admission: RE | Admit: 2013-02-10 | Discharge: 2013-02-10 | Disposition: A | Discharge status: Home / Self Care | Payer: Medicare Other | Source: Ambulatory Visit | Attending: Cardiovascular Disease | Admitting: Cardiovascular Disease

## 2013-02-12 ENCOUNTER — Encounter (HOSPITAL_COMMUNITY): Payer: Medicare Other

## 2013-02-15 ENCOUNTER — Encounter (HOSPITAL_COMMUNITY): Payer: Medicare Other

## 2013-02-17 ENCOUNTER — Encounter (HOSPITAL_COMMUNITY): Payer: Medicare Other

## 2013-02-19 ENCOUNTER — Encounter (HOSPITAL_COMMUNITY): Payer: Medicare Other

## 2013-02-22 ENCOUNTER — Encounter (HOSPITAL_COMMUNITY): Payer: Medicare Other

## 2013-02-24 ENCOUNTER — Encounter (HOSPITAL_COMMUNITY): Payer: Medicare Other

## 2013-02-26 ENCOUNTER — Encounter (HOSPITAL_COMMUNITY): Payer: Medicare Other

## 2013-02-26 ENCOUNTER — Encounter (HOSPITAL_COMMUNITY): Payer: Self-pay

## 2013-02-26 DIAGNOSIS — Z9861 Coronary angioplasty status: Secondary | ICD-10-CM | POA: Insufficient documentation

## 2013-02-26 DIAGNOSIS — Z5189 Encounter for other specified aftercare: Secondary | ICD-10-CM | POA: Insufficient documentation

## 2013-02-26 DIAGNOSIS — I252 Old myocardial infarction: Secondary | ICD-10-CM | POA: Insufficient documentation

## 2013-02-26 DIAGNOSIS — I10 Essential (primary) hypertension: Secondary | ICD-10-CM | POA: Insufficient documentation

## 2013-02-26 DIAGNOSIS — I2510 Atherosclerotic heart disease of native coronary artery without angina pectoris: Secondary | ICD-10-CM | POA: Insufficient documentation

## 2013-03-01 ENCOUNTER — Encounter (HOSPITAL_COMMUNITY)
Admission: RE | Admit: 2013-03-01 | Discharge: 2013-03-01 | Disposition: A | Discharge status: Home / Self Care | Payer: Self-pay | Source: Ambulatory Visit | Attending: Cardiovascular Disease | Admitting: Cardiovascular Disease

## 2013-03-01 ENCOUNTER — Encounter (HOSPITAL_COMMUNITY): Payer: Medicare Other

## 2013-03-03 ENCOUNTER — Encounter (HOSPITAL_COMMUNITY): Payer: Self-pay

## 2013-03-03 ENCOUNTER — Encounter (HOSPITAL_COMMUNITY): Payer: Medicare Other

## 2013-03-05 ENCOUNTER — Encounter (HOSPITAL_COMMUNITY)
Admission: RE | Admit: 2013-03-05 | Discharge: 2013-03-05 | Disposition: A | Discharge status: Home / Self Care | Payer: Self-pay | Source: Ambulatory Visit | Attending: Cardiovascular Disease | Admitting: Cardiovascular Disease

## 2013-03-05 ENCOUNTER — Encounter (HOSPITAL_COMMUNITY): Payer: Medicare Other

## 2013-03-08 ENCOUNTER — Encounter (HOSPITAL_COMMUNITY): Payer: Self-pay

## 2013-03-08 ENCOUNTER — Encounter (HOSPITAL_COMMUNITY): Payer: Medicare Other

## 2013-03-10 ENCOUNTER — Encounter (HOSPITAL_COMMUNITY): Payer: Medicare Other

## 2013-03-10 ENCOUNTER — Encounter (HOSPITAL_COMMUNITY): Payer: Self-pay

## 2013-03-12 ENCOUNTER — Encounter (HOSPITAL_COMMUNITY): Payer: Medicare Other

## 2013-03-12 ENCOUNTER — Encounter (HOSPITAL_COMMUNITY)
Admission: RE | Admit: 2013-03-12 | Discharge: 2013-03-12 | Disposition: A | Discharge status: Home / Self Care | Payer: Self-pay | Source: Ambulatory Visit | Attending: Cardiovascular Disease | Admitting: Cardiovascular Disease

## 2013-03-15 ENCOUNTER — Encounter (HOSPITAL_COMMUNITY)
Admission: RE | Admit: 2013-03-15 | Discharge: 2013-03-15 | Disposition: A | Discharge status: Home / Self Care | Payer: Self-pay | Source: Ambulatory Visit | Attending: Cardiovascular Disease | Admitting: Cardiovascular Disease

## 2013-03-15 ENCOUNTER — Encounter (HOSPITAL_COMMUNITY): Payer: Medicare Other

## 2013-03-17 ENCOUNTER — Encounter (HOSPITAL_COMMUNITY)
Admission: RE | Admit: 2013-03-17 | Discharge: 2013-03-17 | Disposition: A | Discharge status: Home / Self Care | Payer: Self-pay | Source: Ambulatory Visit | Attending: Cardiovascular Disease | Admitting: Cardiovascular Disease

## 2013-03-17 ENCOUNTER — Encounter (HOSPITAL_COMMUNITY): Payer: Medicare Other

## 2013-03-19 ENCOUNTER — Encounter (HOSPITAL_COMMUNITY): Payer: Medicare Other

## 2013-03-19 ENCOUNTER — Encounter (HOSPITAL_COMMUNITY)
Admission: RE | Admit: 2013-03-19 | Discharge: 2013-03-19 | Disposition: A | Discharge status: Home / Self Care | Payer: Self-pay | Source: Ambulatory Visit | Attending: Cardiovascular Disease | Admitting: Cardiovascular Disease

## 2013-03-22 ENCOUNTER — Encounter (HOSPITAL_COMMUNITY): Payer: Medicare Other

## 2013-03-22 ENCOUNTER — Encounter (HOSPITAL_COMMUNITY): Payer: Self-pay

## 2013-03-24 ENCOUNTER — Encounter (HOSPITAL_COMMUNITY): Payer: Medicare Other

## 2013-03-24 ENCOUNTER — Encounter (HOSPITAL_COMMUNITY)
Admission: RE | Admit: 2013-03-24 | Discharge: 2013-03-24 | Disposition: A | Discharge status: Home / Self Care | Payer: Medicare HMO | Source: Ambulatory Visit | Attending: Cardiovascular Disease | Admitting: Cardiovascular Disease

## 2013-03-26 ENCOUNTER — Encounter (HOSPITAL_COMMUNITY)
Admission: RE | Admit: 2013-03-26 | Discharge: 2013-03-26 | Disposition: A | Discharge status: Home / Self Care | Payer: Self-pay | Source: Ambulatory Visit | Attending: Cardiovascular Disease | Admitting: Cardiovascular Disease

## 2013-03-26 ENCOUNTER — Encounter (HOSPITAL_COMMUNITY): Payer: Medicare Other

## 2013-03-29 ENCOUNTER — Encounter (HOSPITAL_COMMUNITY): Payer: Medicare Other

## 2013-03-29 ENCOUNTER — Encounter (HOSPITAL_COMMUNITY): Payer: Self-pay

## 2013-03-29 ENCOUNTER — Encounter: Payer: Self-pay | Admitting: Physician Assistant

## 2013-03-29 ENCOUNTER — Telehealth: Payer: Self-pay | Admitting: Cardiovascular Disease

## 2013-03-29 ENCOUNTER — Ambulatory Visit (INDEPENDENT_AMBULATORY_CARE_PROVIDER_SITE_OTHER): Payer: Medicare HMO | Admitting: Physician Assistant

## 2013-03-29 VITALS — BP 150/98 | HR 73 | Ht 70.0 in | Wt 208.4 lb

## 2013-03-29 DIAGNOSIS — I6529 Occlusion and stenosis of unspecified carotid artery: Secondary | ICD-10-CM

## 2013-03-29 DIAGNOSIS — I10 Essential (primary) hypertension: Secondary | ICD-10-CM | POA: Insufficient documentation

## 2013-03-29 DIAGNOSIS — R61 Generalized hyperhidrosis: Secondary | ICD-10-CM

## 2013-03-29 DIAGNOSIS — I2510 Atherosclerotic heart disease of native coronary artery without angina pectoris: Secondary | ICD-10-CM | POA: Insufficient documentation

## 2013-03-29 DIAGNOSIS — I712 Thoracic aortic aneurysm, without rupture, unspecified: Secondary | ICD-10-CM

## 2013-03-29 DIAGNOSIS — I739 Peripheral vascular disease, unspecified: Secondary | ICD-10-CM

## 2013-03-29 DIAGNOSIS — I252 Old myocardial infarction: Secondary | ICD-10-CM | POA: Insufficient documentation

## 2013-03-29 DIAGNOSIS — Z9861 Coronary angioplasty status: Secondary | ICD-10-CM | POA: Insufficient documentation

## 2013-03-29 DIAGNOSIS — I779 Disorder of arteries and arterioles, unspecified: Secondary | ICD-10-CM | POA: Insufficient documentation

## 2013-03-29 DIAGNOSIS — E785 Hyperlipidemia, unspecified: Secondary | ICD-10-CM

## 2013-03-29 DIAGNOSIS — Z5189 Encounter for other specified aftercare: Secondary | ICD-10-CM | POA: Insufficient documentation

## 2013-03-29 LAB — CBC WITH DIFFERENTIAL/PLATELET
BASOS ABS: 0 10*3/uL (ref 0.0–0.1)
Basophils Relative: 0.3 % (ref 0.0–3.0)
EOS ABS: 0.1 10*3/uL (ref 0.0–0.7)
Eosinophils Relative: 1.4 % (ref 0.0–5.0)
HEMATOCRIT: 50.1 % (ref 39.0–52.0)
HEMOGLOBIN: 16.5 g/dL (ref 13.0–17.0)
LYMPHS ABS: 2.8 10*3/uL (ref 0.7–4.0)
LYMPHS PCT: 27.7 % (ref 12.0–46.0)
MCHC: 33 g/dL (ref 30.0–36.0)
MCV: 101 fl — ABNORMAL HIGH (ref 78.0–100.0)
MONOS PCT: 9.5 % (ref 3.0–12.0)
Monocytes Absolute: 1 10*3/uL (ref 0.1–1.0)
Neutro Abs: 6.1 10*3/uL (ref 1.4–7.7)
Neutrophils Relative %: 61.1 % (ref 43.0–77.0)
PLATELETS: 187 10*3/uL (ref 150.0–400.0)
RBC: 4.96 Mil/uL (ref 4.22–5.81)
RDW: 14.6 % (ref 11.5–14.6)
WBC: 10 10*3/uL (ref 4.5–10.5)

## 2013-03-29 LAB — BASIC METABOLIC PANEL
BUN: 13 mg/dL (ref 6–23)
CALCIUM: 10.1 mg/dL (ref 8.4–10.5)
CO2: 28 meq/L (ref 19–32)
Chloride: 102 mEq/L (ref 96–112)
Creatinine, Ser: 0.9 mg/dL (ref 0.4–1.5)
GFR: 92.31 mL/min (ref >60.00)
GLUCOSE: 97 mg/dL (ref 70–99)
Potassium: 4.2 mEq/L (ref 3.5–5.1)
Sodium: 137 mEq/L (ref 135–145)

## 2013-03-29 LAB — TROPONIN I

## 2013-03-29 MED ORDER — METOPROLOL TARTRATE 50 MG PO TABS: 50.0000 mg | ORAL_TABLET | Freq: Two times a day (BID) | ORAL | Status: DC

## 2013-03-29 NOTE — Telephone Encounter (Signed)
New problem   Pt need to speak to nurse concerning eposide he had last night with cold sweats. He stated it was similar to same symptoms when he had heart attack. Please call pt concerning this matter.

## 2013-03-29 NOTE — Telephone Encounter (Signed)
Spoke with pt. He reports headache which started around 2 PM on Sunday. He took Tylenol with relief. Ate pizza for dinner with Dr. Malachi Bonds to drink. Later last night he developed cold sweats. No pain. Blood pressure at this time was 178/84 and 168/82. He rested about one hour and felt better and blood pressure was 124/70.  He reports having cold sweats and back pain prior to MI. No back pain last night. Also reports he had cold sweats last August and he had ischemic colitis at that time. Was also having diarrhea and GI problems at that time and reports he is not having any GI symptoms at present.  Feels good today except one brief cold sweat this AM. Appt made for pt to see Richardson Dopp, PA today at 2:00.

## 2013-03-29 NOTE — Progress Notes (Signed)
[address], [place], [place]  [address] Phone: [phone] Fax:  [phone]  Date:  03/29/2013   ID:  Samuel Coleman, DOB 1945/06/17, MRN [id number]  PCP:  Jani Gravel, MD  Cardiologist:  Dr. Lauree Chandler     History of Present Illness: Samuel Coleman is a 68 y.o. male with a hx of CAD, s/p inferior STEMI and subsequent CABG (L-LAD, S-OM, S-RCA) in 12/2009, HTN, HL, prior ischemic colitis, thoracic aortic aneurysm, GERD.  Echo (01/2010):  EF 55-60%, ? Apical HK, mild BAE, atrial septal aneurysm.  Carotid US (01/2011):  Bilateral ICA 0-39% (repeat in 1 year).  Chest/abdominal CTA (11/2011):  3.9 x 3.6 cm prox descending thoracic aorta (stable).  Last Myoview (05/2012):  Normal; EF 70%.  Event monitor in 04/2012: NSR, PVCs.  Last seen by Dr. Lauree Chandler 10/29/2012.    He called in today with symptoms of diaphoresis and was added on to my schedule for evaluation.  He tells me that he had a HA yesterday.  He ate some pizza and had a Dr. Malachi Bonds.  He then developed what he calls a "cold sweat."  He felt diaphoretic for several minutes.  He noted that his BP was elevated (178/84).  He may have been short of breath with this.  He took a shower and felt better afterwards.  His BP was better then as well.  He did note some chest tightness later but this felt like indigestion to him.  He was concerned b/c he had an episode of diaphoresis with his MI as well as his ischemic colitis last year.  At the time of his MI he had back pain.  With his colitis, he had diarrhea.  He has not had back pain or diarrhea at this time.  He is on Doxycycline for a spider bite on his leg with assoc rash.  He feels better today without chest pain or dyspnea.    Recent Labs: 12/03/2012: Creatinine 0.81; Hemoglobin 15.1; Potassium 3.8   Wt Readings from Last 3 Encounters:  03/29/13 208 lb 6.4 oz (94.53 kg)  12/16/12 202 lb 3.2 oz (91.717 kg)  12/02/12 204 lb (92.534 kg)     Past Medical  History  Diagnosis Date  . Thoracic aortic aneurysm      stable on CT 01/13/10;  Chest/abdominal CTA (11/2011):  3.9 x 3.6 cm prox descending thoracic aorta (stable)  . Hyperlipidemia   . Gout   . GERD (gastroesophageal reflux disease)   . Hypertension   . Ischemic colitis     noted on CT back in August 2013 possible related to hypotension  . ST elevation myocardial infarction (STEMI) of inferior wall 2011    s/p CABG 2011  . Asthma     exercise induced  . PUD (peptic ulcer disease) 1974  . History of kidney stones     no problems with stones  . Anxiety   . Back pain, chronic     gets injections in back  . CAD (coronary artery disease)     a.  prior inferior MI; s/p 3V CABG 01/01/10 (LIMA to LAD, SVG to OM, SVG to RCA- Dr.Bartle);  b. Last Myoview (05/2012):  Normal; EF 70%  . Hx of echocardiogram     Echo (01/2010):  EF 55-60%, ? Apical HK, mild BAE, atrial septal aneurysm.  . PVC's (premature ventricular contractions)     Event monitor in 04/2012: NSR, PVCs.  . Carotid stenosis  a. Carotid US (01/2011):  Bilateral ICA 0-39% (repeat in 1 year).    Current Outpatient Prescriptions  Medication Sig Dispense Refill  . allopurinol (ZYLOPRIM) 300 MG tablet Take 300 mg by mouth daily.      Marland Kitchen aspirin EC 81 MG tablet Take 81 mg by mouth daily.      . cholecalciferol (VITAMIN D) 1000 UNITS tablet Take 1,000 Units by mouth daily.      Marland Kitchen dexlansoprazole (DEXILANT) 60 MG capsule Take 60 mg by mouth daily.       . diphenhydrAMINE (SOMINEX) 25 MG tablet Take 25 mg by mouth at bedtime as needed for sleep.      Marland Kitchen doxycycline (VIBRAMYCIN) 100 MG capsule Take 100 mg by mouth 2 (two) times daily.      Marland Kitchen lisinopril (PRINIVIL,ZESTRIL) 20 MG tablet Take 1 tablet (20 mg total) by mouth 2 (two) times daily.  60 tablet  11  . metoprolol tartrate (LOPRESSOR) 25 MG tablet Take 25 mg by mouth 2 (two) times daily.      . Multiple Vitamin (MULTIVITAMIN WITH MINERALS) TABS tablet Take 1 tablet by mouth  daily.      . nitroGLYCERIN (NITROSTAT) 0.4 MG SL tablet Place 1 tablet (0.4 mg total) under the tongue every 5 (five) minutes as needed for chest pain.  25 tablet  6  . simvastatin (ZOCOR) 40 MG tablet Take 40 mg by mouth every morning.       . vitamin B-12 (CYANOCOBALAMIN) 100 MCG tablet Take 500 mcg by mouth daily.       . vitamin C (ASCORBIC ACID) 500 MG tablet Take 1,000 mg by mouth daily.        No current facility-administered medications for this visit.    Allergies:   Review of patient's allergies indicates no known allergies.   Social History:  The patient  reports that he quit smoking about 39 years ago. He does not have any smokeless tobacco history on file. He reports that he drinks about 0.6 ounces of alcohol per week. He reports that he does not use illicit drugs.   Family History:  The patient's family history includes Cancer in his maternal grandmother, sister, and sister; Heart failure in his mother; Pneumonia in his father.   ROS:  Please see the history of present illness.      All other systems reviewed and negative.   PHYSICAL EXAM: VS:  BP 150/98  Pulse 73  Ht 5\' 10"  (1.778 m)  Wt 208 lb 6.4 oz (94.53 kg)  BMI 29.90 kg/m2 Well nourished, well developed, in no acute distress HEENT: normal Neck: no JVD Cardiac:  normal S1, S2; RRR; no murmur Lungs:  clear to auscultation bilaterally, no wheezing, rhonchi or rales Abd: soft, nontender, no hepatomegaly Ext: no edema Skin: warm and dry;  L leg without erythema or discharge Neuro:  CNs 2-12 intact, no focal abnormalities noted  EKG:  NSR, HR 73, rightward axis nonspecific ST-T wave changes, no significant change when compared to prior tracing     ASSESSMENT AND PLAN:  1. Episodic Diaphoresis:  Etiology not clear.  He has been on doxycycline for a spider bite for the past 2 weeks. He sees dermatology soon for follow up.  This does not appear to be infected.  No other etiology for infection noted.  Question if  related to dyspepsia given type of meal ingested prior to this episode occurring.  Check BMET and CBC.  As he had diaphoresis with his prior  MI, will check a stat troponin.  If this is elevated, he will be sent to the ED for admission.   2. CAD:  As noted, I will check a troponin.  I also reviewed his case with Dr. Casandra Doffing (DOD).  He had a stress test that was low risk in 05/2012.  At this point, he has not had any exertional symptoms and we do not feel that he needs repeat stress testing.  I will adjust antianginal Rx by increasing his metoprolol to 50 BID. 3. Hypertension:  Elevated.  Increase metoprolol to 50 bid.   4. Hyperlipidemia:  Continue statin.   5. Carotid Stenosis:  Arrange f/u Carotid US.   6. Thoracic Aortic Aneurysm:  Stable by last CT in 11/2011.  It appears his aneurysm has measured 3.9 cm since 2009.  Defer timing of next scan to Dr. Lauree Chandler.   7. Disposition:  F/u with Dr. Lauree Chandler or me in 3-4 weeks.   Signed, Richardson Dopp, PA-C  03/29/2013 2:29 PM

## 2013-03-29 NOTE — Patient Instructions (Signed)
INCREASE METOPROLOL TO 50 MG TWICE DAILY; NEW RX WAS SENT IN TODAY FOR THE 50 MG TABLET  LAB WORK TODAY; STAT TROPONIN I; BMET, CBC W/DIFF  Your physician has requested that you have a carotid duplex. This test is an ultrasound of the carotid arteries in your neck. It looks at blood flow through these arteries that supply the brain with blood. Allow one hour for this exam. There are no restrictions or special instructions.   Your physician recommends that you schedule a follow-up appointment in: 3-4 WEEKS WITH DR. Angelena Form OR SCOTT WEAVER, PAC SAME DAY DR. Angelena Form IS IN THE OFFICE PER SCOTT.

## 2013-03-30 ENCOUNTER — Telehealth: Payer: Self-pay | Admitting: *Deleted

## 2013-03-30 NOTE — Telephone Encounter (Signed)
lmom labs ok, Troponin I was negative, other labs ok, no chanegs to be made, continue same medication regimen. left [phone] tcb if any questions.

## 2013-03-31 ENCOUNTER — Encounter (HOSPITAL_COMMUNITY): Payer: Medicare Other

## 2013-03-31 ENCOUNTER — Encounter (HOSPITAL_COMMUNITY): Payer: Medicare HMO

## 2013-04-01 ENCOUNTER — Encounter: Payer: Self-pay | Admitting: Cardiology

## 2013-04-01 ENCOUNTER — Ambulatory Visit (HOSPITAL_COMMUNITY): Payer: Medicare HMO | Attending: Cardiology

## 2013-04-01 DIAGNOSIS — Z87891 Personal history of nicotine dependence: Secondary | ICD-10-CM | POA: Insufficient documentation

## 2013-04-01 DIAGNOSIS — I10 Essential (primary) hypertension: Secondary | ICD-10-CM | POA: Insufficient documentation

## 2013-04-01 DIAGNOSIS — I2510 Atherosclerotic heart disease of native coronary artery without angina pectoris: Secondary | ICD-10-CM | POA: Insufficient documentation

## 2013-04-01 DIAGNOSIS — I712 Thoracic aortic aneurysm, without rupture, unspecified: Secondary | ICD-10-CM

## 2013-04-01 DIAGNOSIS — E785 Hyperlipidemia, unspecified: Secondary | ICD-10-CM | POA: Insufficient documentation

## 2013-04-01 DIAGNOSIS — I658 Occlusion and stenosis of other precerebral arteries: Secondary | ICD-10-CM | POA: Insufficient documentation

## 2013-04-01 DIAGNOSIS — Z951 Presence of aortocoronary bypass graft: Secondary | ICD-10-CM | POA: Insufficient documentation

## 2013-04-01 DIAGNOSIS — R0989 Other specified symptoms and signs involving the circulatory and respiratory systems: Secondary | ICD-10-CM | POA: Insufficient documentation

## 2013-04-01 DIAGNOSIS — R42 Dizziness and giddiness: Secondary | ICD-10-CM | POA: Insufficient documentation

## 2013-04-01 DIAGNOSIS — I6529 Occlusion and stenosis of unspecified carotid artery: Secondary | ICD-10-CM

## 2013-04-02 ENCOUNTER — Encounter: Payer: Self-pay | Admitting: Physician Assistant

## 2013-04-02 ENCOUNTER — Encounter (HOSPITAL_COMMUNITY): Payer: Medicare Other

## 2013-04-02 ENCOUNTER — Telehealth: Payer: Self-pay | Admitting: *Deleted

## 2013-04-02 ENCOUNTER — Encounter (HOSPITAL_COMMUNITY)
Admission: RE | Admit: 2013-04-02 | Discharge: 2013-04-02 | Disposition: A | Discharge status: Home / Self Care | Payer: Self-pay | Source: Ambulatory Visit | Attending: Cardiovascular Disease | Admitting: Cardiovascular Disease

## 2013-04-02 DIAGNOSIS — I6529 Occlusion and stenosis of unspecified carotid artery: Secondary | ICD-10-CM

## 2013-04-02 DIAGNOSIS — R0989 Other specified symptoms and signs involving the circulatory and respiratory systems: Secondary | ICD-10-CM

## 2013-04-02 NOTE — Telephone Encounter (Signed)
lmptcb for carotid results

## 2013-04-02 NOTE — Telephone Encounter (Signed)
lmptcb x 3 for carotid results

## 2013-04-05 ENCOUNTER — Encounter (HOSPITAL_COMMUNITY): Payer: Medicare Other

## 2013-04-05 ENCOUNTER — Encounter (HOSPITAL_COMMUNITY): Payer: Self-pay

## 2013-04-05 NOTE — Telephone Encounter (Signed)
I reached pt on his cell today and he has been notified about carotid results with verbal understanding. Pt asked if there was any changes from the carotid u/s in 2012. I reviewed report advised pt no, still mod plague R, mild plague L. Repeat 1 yrPt verbalized understanding to the carotid results today

## 2013-04-07 ENCOUNTER — Encounter (HOSPITAL_COMMUNITY): Payer: Self-pay

## 2013-04-07 ENCOUNTER — Encounter (HOSPITAL_COMMUNITY): Payer: Medicare Other

## 2013-04-09 ENCOUNTER — Encounter (HOSPITAL_COMMUNITY)
Admission: RE | Admit: 2013-04-09 | Discharge: 2013-04-09 | Disposition: A | Discharge status: Home / Self Care | Payer: Medicare HMO | Source: Ambulatory Visit | Attending: Cardiovascular Disease | Admitting: Cardiovascular Disease

## 2013-04-09 ENCOUNTER — Encounter (HOSPITAL_COMMUNITY): Payer: Medicare Other

## 2013-04-12 ENCOUNTER — Encounter (HOSPITAL_COMMUNITY): Payer: Self-pay

## 2013-04-12 ENCOUNTER — Encounter (HOSPITAL_COMMUNITY): Payer: Medicare Other

## 2013-04-14 ENCOUNTER — Encounter (HOSPITAL_COMMUNITY)
Admission: RE | Admit: 2013-04-14 | Discharge: 2013-04-14 | Disposition: A | Discharge status: Home / Self Care | Payer: Self-pay | Source: Ambulatory Visit | Attending: Cardiovascular Disease | Admitting: Cardiovascular Disease

## 2013-04-14 ENCOUNTER — Encounter (HOSPITAL_COMMUNITY): Payer: Medicare Other

## 2013-04-16 ENCOUNTER — Encounter (HOSPITAL_COMMUNITY): Payer: Self-pay

## 2013-04-16 ENCOUNTER — Encounter (HOSPITAL_COMMUNITY): Payer: Medicare Other

## 2013-04-19 ENCOUNTER — Encounter (HOSPITAL_COMMUNITY): Payer: Medicare Other

## 2013-04-19 ENCOUNTER — Encounter (HOSPITAL_COMMUNITY): Payer: Self-pay

## 2013-04-20 ENCOUNTER — Encounter: Payer: Self-pay | Admitting: Physician Assistant

## 2013-04-20 ENCOUNTER — Ambulatory Visit (INDEPENDENT_AMBULATORY_CARE_PROVIDER_SITE_OTHER): Payer: Medicare HMO | Admitting: Physician Assistant

## 2013-04-20 VITALS — BP 136/88 | HR 83 | Ht 70.0 in | Wt 206.8 lb

## 2013-04-20 DIAGNOSIS — E785 Hyperlipidemia, unspecified: Secondary | ICD-10-CM

## 2013-04-20 DIAGNOSIS — I712 Thoracic aortic aneurysm, without rupture, unspecified: Secondary | ICD-10-CM

## 2013-04-20 DIAGNOSIS — I6529 Occlusion and stenosis of unspecified carotid artery: Secondary | ICD-10-CM

## 2013-04-20 DIAGNOSIS — I2510 Atherosclerotic heart disease of native coronary artery without angina pectoris: Secondary | ICD-10-CM

## 2013-04-20 DIAGNOSIS — I10 Essential (primary) hypertension: Secondary | ICD-10-CM

## 2013-04-20 NOTE — Patient Instructions (Signed)
Good to see you today.  Be careful on the way home.  Your physician recommends that you schedule a follow-up appointment in: 6 mos with Dr. Lauree Chandler.

## 2013-04-20 NOTE — Progress Notes (Signed)
[address], [place], [place]  [address] Phone: [phone] Fax:  [phone]  Date:  04/20/2013   ID:  Samuel Coleman, DOB 12-Apr-1945, MRN [id number]  PCP:  Jani Gravel, MD  Cardiologist:  Dr. Lauree Chandler     History of Present Illness: Samuel Coleman is a 68 y.o. male with a hx of CAD, s/p inferior STEMI and subsequent CABG (L-LAD, S-OM, S-RCA) in 12/2009, HTN, HL, prior ischemic colitis, thoracic aortic aneurysm, GERD.  I saw him 03/29/13 with episodes of diaphoresis. This was somewhat concerning for him as he had diaphoresis in the setting of his prior myocardial infarction. Troponin was negative. He had a recent low risk stress test and no further testing was indicated. I did increase his antianginal therapy by increasing his metoprolol to 50 mg twice a day.  Echo (01/2010):  EF 55-60%, ? Apical HK, mild BAE, atrial septal aneurysm.   Carotid US (03/2013):  RICA 70-62%; LICA 3-76% (repeat in 1 year).   Chest/abdominal CTA (11/2011):  3.9 x 3.6 cm prox descending thoracic aorta (stable).   Last Myoview (05/2012):  Normal; EF 70%.   Event monitor in 04/2012: NSR, PVCs.    He is doing well.  No further episodes.  He thinks he probably had gas.  The patient denies chest pain, shortness of breath, syncope, orthopnea, PND or significant pedal edema.   Recent Labs: 03/29/2013: Creatinine 0.9; Hemoglobin 16.5; Potassium 4.2   Wt Readings from Last 3 Encounters:  04/20/13 206 lb 12.8 oz (93.804 kg)  03/29/13 208 lb 6.4 oz (94.53 kg)  12/16/12 202 lb 3.2 oz (91.717 kg)     Past Medical History  Diagnosis Date  . Thoracic aortic aneurysm      stable on CT 01/13/10;  Chest/abdominal CTA (11/2011):  3.9 x 3.6 cm prox descending thoracic aorta (stable)  . Hyperlipidemia   . Gout   . GERD (gastroesophageal reflux disease)   . Hypertension   . Ischemic colitis     noted on CT back in August 2013 possible related to hypotension  . ST elevation myocardial  infarction (STEMI) of inferior wall 2011    s/p CABG 2011  . Asthma     exercise induced  . PUD (peptic ulcer disease) 1974  . History of kidney stones     no problems with stones  . Anxiety   . Back pain, chronic     gets injections in back  . CAD (coronary artery disease)     a.  prior inferior MI; s/p 3V CABG 01/01/10 (LIMA to LAD, SVG to OM, SVG to RCA- Dr.Bartle);  b. Last Myoview (05/2012):  Normal; EF 70%  . Hx of echocardiogram     Echo (01/2010):  EF 55-60%, ? Apical HK, mild BAE, atrial septal aneurysm.  . PVC's (premature ventricular contractions)     Event monitor in 04/2012: NSR, PVCs.  . Carotid stenosis     a. Carotid US (01/2011):  Bilateral ICA 0-39% (repeat in 1 year).;  b.  Carotid US (03/8313): RICA 17-61%; LICA 6-07%. Follow up 1 year    Current Outpatient Prescriptions  Medication Sig Dispense Refill  . allopurinol (ZYLOPRIM) 300 MG tablet Take 300 mg by mouth daily.      Marland Kitchen aspirin EC 81 MG tablet Take 81 mg by mouth daily.      . cholecalciferol (VITAMIN D) 1000 UNITS tablet Take 1,000 Units by mouth daily.      Marland Kitchen dexlansoprazole (DEXILANT)  60 MG capsule Take 60 mg by mouth daily.       . diphenhydrAMINE (SOMINEX) 25 MG tablet Take 25 mg by mouth at bedtime as needed for sleep.      Marland Kitchen lisinopril (PRINIVIL,ZESTRIL) 20 MG tablet Take 1 tablet (20 mg total) by mouth 2 (two) times daily.  60 tablet  11  . metoprolol (LOPRESSOR) 50 MG tablet Take 50 mg by mouth. 50 mg in the am and 25 mg in the pm      . Multiple Vitamin (MULTIVITAMIN WITH MINERALS) TABS tablet Take 1 tablet by mouth daily.      . nitroGLYCERIN (NITROSTAT) 0.4 MG SL tablet Place 1 tablet (0.4 mg total) under the tongue every 5 (five) minutes as needed for chest pain.  25 tablet  6  . simvastatin (ZOCOR) 40 MG tablet Take 40 mg by mouth every morning.       . vitamin B-12 (CYANOCOBALAMIN) 100 MCG tablet Take 500 mcg by mouth daily.       . vitamin C (ASCORBIC ACID) 500 MG tablet Take 1,000 mg by mouth  daily.        No current facility-administered medications for this visit.    Allergies:   Review of patient's allergies indicates no known allergies.   Social History:  The patient  reports that he quit smoking about 39 years ago. He does not have any smokeless tobacco history on file. He reports that he drinks about 0.6 ounces of alcohol per week. He reports that he does not use illicit drugs.   Family History:  The patient's family history includes Cancer in his maternal grandmother, sister, and sister; Heart failure in his mother; Pneumonia in his father.   ROS:  Please see the history of present illness.    All other systems reviewed and negative.   PHYSICAL EXAM: VS:  BP 136/88  Pulse 83  Ht 5\' 10"  (1.778 m)  Wt 206 lb 12.8 oz (93.804 kg)  BMI 29.67 kg/m2 Well nourished, well developed, in no acute distress HEENT: normal Neck: no JVD Cardiac:  normal S1, S2; RRR; no murmur Lungs:  clear to auscultation bilaterally, no wheezing, rhonchi or rales Abd: soft, nontender, no hepatomegaly Ext: no edema Skin: warm and dry;  Neuro:  CNs 2-12 intact, no focal abnormalities noted  EKG:   NSR, HR 83, normal axis, PVC, no ST changes      ASSESSMENT AND PLAN:  1. CAD:  No further episodes of chest pain or diaphoresis.  This was probably GI related. Continue ASA, statin, beta blocker.   2. Hypertension:  Controlled.    3. Hyperlipidemia:  Continue statin.   4. Carotid Stenosis:   Recent Carotid US stable.  Repeat planned in 1 year.   5. Thoracic Aortic Aneurysm:  Stable by last CT in 11/2011.  It appears his aneurysm has measured 3.9 cm since 2009.  Defer timing of next scan to Dr. Lauree Chandler.   6. Disposition:  F/u with Dr. Lauree Chandler in 6 mos.   Signed, Richardson Dopp, PA-C  04/20/2013 10:05 AM

## 2013-04-21 ENCOUNTER — Other Ambulatory Visit: Payer: Self-pay | Admitting: Cardiovascular Disease

## 2013-04-21 ENCOUNTER — Encounter (HOSPITAL_COMMUNITY)
Admission: RE | Admit: 2013-04-21 | Discharge: 2013-04-21 | Disposition: A | Discharge status: Home / Self Care | Payer: Self-pay | Source: Ambulatory Visit | Attending: Cardiovascular Disease | Admitting: Cardiovascular Disease

## 2013-04-21 ENCOUNTER — Encounter (HOSPITAL_COMMUNITY): Payer: Medicare Other

## 2013-04-23 ENCOUNTER — Encounter (HOSPITAL_COMMUNITY)
Admission: RE | Admit: 2013-04-23 | Discharge: 2013-04-23 | Disposition: A | Discharge status: Home / Self Care | Payer: Self-pay | Source: Ambulatory Visit | Attending: Cardiovascular Disease | Admitting: Cardiovascular Disease

## 2013-04-23 ENCOUNTER — Encounter (HOSPITAL_COMMUNITY): Payer: Medicare Other

## 2013-04-26 ENCOUNTER — Encounter (HOSPITAL_COMMUNITY)
Admission: RE | Admit: 2013-04-26 | Discharge: 2013-04-26 | Disposition: A | Discharge status: Home / Self Care | Payer: Self-pay | Source: Ambulatory Visit | Attending: Cardiovascular Disease | Admitting: Cardiovascular Disease

## 2013-04-26 ENCOUNTER — Encounter (HOSPITAL_COMMUNITY): Payer: Self-pay

## 2013-04-26 DIAGNOSIS — I10 Essential (primary) hypertension: Secondary | ICD-10-CM | POA: Insufficient documentation

## 2013-04-26 DIAGNOSIS — I2510 Atherosclerotic heart disease of native coronary artery without angina pectoris: Secondary | ICD-10-CM | POA: Insufficient documentation

## 2013-04-26 DIAGNOSIS — Z9861 Coronary angioplasty status: Secondary | ICD-10-CM | POA: Insufficient documentation

## 2013-04-26 DIAGNOSIS — I252 Old myocardial infarction: Secondary | ICD-10-CM | POA: Insufficient documentation

## 2013-04-26 DIAGNOSIS — Z5189 Encounter for other specified aftercare: Secondary | ICD-10-CM | POA: Insufficient documentation

## 2013-04-28 ENCOUNTER — Encounter (HOSPITAL_COMMUNITY): Payer: Self-pay

## 2013-04-30 ENCOUNTER — Encounter (HOSPITAL_COMMUNITY): Payer: Self-pay

## 2013-04-30 ENCOUNTER — Encounter (HOSPITAL_COMMUNITY)
Admission: RE | Admit: 2013-04-30 | Discharge: 2013-04-30 | Disposition: A | Discharge status: Home / Self Care | Payer: Self-pay | Source: Ambulatory Visit | Attending: Cardiovascular Disease | Admitting: Cardiovascular Disease

## 2013-05-03 ENCOUNTER — Encounter (HOSPITAL_COMMUNITY)
Admission: RE | Admit: 2013-05-03 | Payer: Medicare HMO | Source: Ambulatory Visit | Attending: Cardiovascular Disease | Admitting: Cardiovascular Disease

## 2013-05-03 ENCOUNTER — Encounter (HOSPITAL_COMMUNITY): Payer: Self-pay

## 2013-05-05 ENCOUNTER — Encounter (HOSPITAL_COMMUNITY): Payer: Self-pay

## 2013-05-07 ENCOUNTER — Encounter (HOSPITAL_COMMUNITY): Payer: Self-pay

## 2013-05-10 ENCOUNTER — Encounter (HOSPITAL_COMMUNITY): Payer: Self-pay

## 2013-05-12 ENCOUNTER — Encounter (HOSPITAL_COMMUNITY): Payer: Self-pay

## 2013-05-12 ENCOUNTER — Encounter (HOSPITAL_COMMUNITY)
Admission: RE | Admit: 2013-05-12 | Discharge: 2013-05-12 | Disposition: A | Discharge status: Home / Self Care | Payer: Self-pay | Source: Ambulatory Visit | Attending: Cardiovascular Disease | Admitting: Cardiovascular Disease

## 2013-05-14 ENCOUNTER — Encounter (HOSPITAL_COMMUNITY): Payer: Self-pay

## 2013-05-17 ENCOUNTER — Encounter (HOSPITAL_COMMUNITY): Payer: Self-pay

## 2013-05-19 ENCOUNTER — Encounter (HOSPITAL_COMMUNITY): Payer: Self-pay

## 2013-05-21 ENCOUNTER — Encounter (HOSPITAL_COMMUNITY): Payer: Self-pay

## 2013-05-24 ENCOUNTER — Encounter (HOSPITAL_COMMUNITY): Payer: Self-pay

## 2013-05-28 ENCOUNTER — Encounter (HOSPITAL_COMMUNITY): Payer: Self-pay

## 2013-05-28 DIAGNOSIS — Z5189 Encounter for other specified aftercare: Secondary | ICD-10-CM | POA: Insufficient documentation

## 2013-05-28 DIAGNOSIS — I2510 Atherosclerotic heart disease of native coronary artery without angina pectoris: Secondary | ICD-10-CM | POA: Insufficient documentation

## 2013-05-31 ENCOUNTER — Encounter (HOSPITAL_COMMUNITY): Payer: Self-pay

## 2013-06-02 ENCOUNTER — Encounter (HOSPITAL_COMMUNITY): Payer: Self-pay

## 2013-06-04 ENCOUNTER — Encounter (HOSPITAL_COMMUNITY)
Admission: RE | Admit: 2013-06-04 | Discharge: 2013-06-04 | Disposition: A | Discharge status: Home / Self Care | Payer: Self-pay | Source: Ambulatory Visit | Attending: Cardiovascular Disease | Admitting: Cardiovascular Disease

## 2013-06-07 ENCOUNTER — Encounter (HOSPITAL_COMMUNITY): Payer: Self-pay

## 2013-06-09 ENCOUNTER — Encounter (HOSPITAL_COMMUNITY)
Admission: RE | Admit: 2013-06-09 | Discharge: 2013-06-09 | Disposition: A | Discharge status: Home / Self Care | Payer: Self-pay | Source: Ambulatory Visit | Attending: Cardiovascular Disease | Admitting: Cardiovascular Disease

## 2013-06-11 ENCOUNTER — Encounter (HOSPITAL_COMMUNITY)
Admission: RE | Admit: 2013-06-11 | Discharge: 2013-06-11 | Disposition: A | Discharge status: Home / Self Care | Payer: Self-pay | Source: Ambulatory Visit | Attending: Cardiovascular Disease | Admitting: Cardiovascular Disease

## 2013-06-14 ENCOUNTER — Encounter (HOSPITAL_COMMUNITY): Payer: Self-pay

## 2013-06-16 ENCOUNTER — Encounter (HOSPITAL_COMMUNITY)
Admission: RE | Admit: 2013-06-16 | Discharge: 2013-06-16 | Disposition: A | Discharge status: Home / Self Care | Payer: Self-pay | Source: Ambulatory Visit | Attending: Cardiovascular Disease | Admitting: Cardiovascular Disease

## 2013-06-18 ENCOUNTER — Encounter (HOSPITAL_COMMUNITY): Payer: Self-pay

## 2013-06-21 ENCOUNTER — Encounter (HOSPITAL_COMMUNITY): Payer: Self-pay

## 2013-06-23 ENCOUNTER — Encounter (HOSPITAL_COMMUNITY)
Admission: RE | Admit: 2013-06-23 | Discharge: 2013-06-23 | Disposition: A | Discharge status: Home / Self Care | Payer: Self-pay | Source: Ambulatory Visit | Attending: Cardiovascular Disease | Admitting: Cardiovascular Disease

## 2013-06-25 ENCOUNTER — Encounter (HOSPITAL_COMMUNITY): Payer: Self-pay

## 2013-06-25 DIAGNOSIS — I10 Essential (primary) hypertension: Secondary | ICD-10-CM | POA: Insufficient documentation

## 2013-06-25 DIAGNOSIS — Z5189 Encounter for other specified aftercare: Secondary | ICD-10-CM | POA: Insufficient documentation

## 2013-06-25 DIAGNOSIS — I712 Thoracic aortic aneurysm, without rupture, unspecified: Secondary | ICD-10-CM | POA: Insufficient documentation

## 2013-06-25 DIAGNOSIS — I2510 Atherosclerotic heart disease of native coronary artery without angina pectoris: Secondary | ICD-10-CM | POA: Insufficient documentation

## 2013-06-25 DIAGNOSIS — E785 Hyperlipidemia, unspecified: Secondary | ICD-10-CM | POA: Insufficient documentation

## 2013-06-28 ENCOUNTER — Encounter (HOSPITAL_COMMUNITY): Payer: Medicare HMO

## 2013-06-30 ENCOUNTER — Encounter (HOSPITAL_COMMUNITY)
Admission: RE | Admit: 2013-06-30 | Discharge: 2013-06-30 | Disposition: A | Discharge status: Home / Self Care | Payer: Self-pay | Source: Ambulatory Visit | Attending: Cardiovascular Disease | Admitting: Cardiovascular Disease

## 2013-07-02 ENCOUNTER — Encounter (HOSPITAL_COMMUNITY): Payer: Self-pay

## 2013-07-05 ENCOUNTER — Encounter (HOSPITAL_COMMUNITY): Payer: Self-pay

## 2013-07-07 ENCOUNTER — Encounter (HOSPITAL_COMMUNITY)
Admission: RE | Admit: 2013-07-07 | Discharge: 2013-07-07 | Disposition: A | Discharge status: Home / Self Care | Payer: Self-pay | Source: Ambulatory Visit | Attending: Cardiovascular Disease | Admitting: Cardiovascular Disease

## 2013-07-09 ENCOUNTER — Encounter (HOSPITAL_COMMUNITY)
Admission: RE | Admit: 2013-07-09 | Discharge: 2013-07-09 | Disposition: A | Discharge status: Home / Self Care | Payer: Self-pay | Source: Ambulatory Visit | Attending: Cardiovascular Disease | Admitting: Cardiovascular Disease

## 2013-07-12 ENCOUNTER — Encounter (HOSPITAL_COMMUNITY): Payer: Self-pay

## 2013-07-14 ENCOUNTER — Encounter (HOSPITAL_COMMUNITY)
Admission: RE | Admit: 2013-07-14 | Discharge: 2013-07-14 | Disposition: A | Discharge status: Home / Self Care | Payer: Self-pay | Source: Ambulatory Visit | Attending: Cardiovascular Disease | Admitting: Cardiovascular Disease

## 2013-07-16 ENCOUNTER — Encounter (HOSPITAL_COMMUNITY)
Admission: RE | Admit: 2013-07-16 | Discharge: 2013-07-16 | Disposition: A | Discharge status: Home / Self Care | Payer: Self-pay | Source: Ambulatory Visit | Attending: Cardiovascular Disease | Admitting: Cardiovascular Disease

## 2013-07-21 ENCOUNTER — Encounter (HOSPITAL_COMMUNITY)
Admission: RE | Admit: 2013-07-21 | Discharge: 2013-07-21 | Disposition: A | Discharge status: Home / Self Care | Payer: Self-pay | Source: Ambulatory Visit | Attending: Cardiovascular Disease | Admitting: Cardiovascular Disease

## 2013-07-23 ENCOUNTER — Encounter (HOSPITAL_COMMUNITY): Payer: Self-pay

## 2013-07-26 ENCOUNTER — Encounter (HOSPITAL_COMMUNITY): Payer: Self-pay

## 2013-07-26 DIAGNOSIS — I10 Essential (primary) hypertension: Secondary | ICD-10-CM | POA: Insufficient documentation

## 2013-07-26 DIAGNOSIS — E785 Hyperlipidemia, unspecified: Secondary | ICD-10-CM | POA: Insufficient documentation

## 2013-07-26 DIAGNOSIS — I712 Thoracic aortic aneurysm, without rupture, unspecified: Secondary | ICD-10-CM | POA: Insufficient documentation

## 2013-07-26 DIAGNOSIS — I2510 Atherosclerotic heart disease of native coronary artery without angina pectoris: Secondary | ICD-10-CM | POA: Insufficient documentation

## 2013-07-26 DIAGNOSIS — Z5189 Encounter for other specified aftercare: Secondary | ICD-10-CM | POA: Insufficient documentation

## 2013-07-28 ENCOUNTER — Encounter (HOSPITAL_COMMUNITY): Payer: Medicare HMO

## 2013-07-30 ENCOUNTER — Encounter (HOSPITAL_COMMUNITY): Payer: Self-pay

## 2013-08-02 ENCOUNTER — Encounter (HOSPITAL_COMMUNITY): Payer: Self-pay

## 2013-08-04 ENCOUNTER — Encounter (HOSPITAL_COMMUNITY)
Admission: RE | Admit: 2013-08-04 | Discharge: 2013-08-04 | Disposition: A | Discharge status: Home / Self Care | Payer: Self-pay | Source: Ambulatory Visit | Attending: Cardiovascular Disease | Admitting: Cardiovascular Disease

## 2013-08-06 ENCOUNTER — Encounter (HOSPITAL_COMMUNITY)
Admission: RE | Admit: 2013-08-06 | Discharge: 2013-08-06 | Disposition: A | Discharge status: Home / Self Care | Payer: Self-pay | Source: Ambulatory Visit | Attending: Cardiovascular Disease | Admitting: Cardiovascular Disease

## 2013-08-08 ENCOUNTER — Other Ambulatory Visit: Payer: Self-pay | Admitting: Physician Assistant

## 2013-08-08 ENCOUNTER — Other Ambulatory Visit: Payer: Self-pay | Admitting: Cardiovascular Disease

## 2013-08-09 ENCOUNTER — Encounter (HOSPITAL_COMMUNITY): Payer: Self-pay

## 2013-08-11 ENCOUNTER — Encounter (HOSPITAL_COMMUNITY)
Admission: RE | Admit: 2013-08-11 | Discharge: 2013-08-11 | Disposition: A | Discharge status: Home / Self Care | Payer: Self-pay | Source: Ambulatory Visit | Attending: Cardiovascular Disease | Admitting: Cardiovascular Disease

## 2013-08-13 ENCOUNTER — Encounter (HOSPITAL_COMMUNITY)
Admission: RE | Admit: 2013-08-13 | Discharge: 2013-08-13 | Disposition: A | Discharge status: Home / Self Care | Payer: Self-pay | Source: Ambulatory Visit | Attending: Cardiovascular Disease | Admitting: Cardiovascular Disease

## 2013-08-16 ENCOUNTER — Encounter (HOSPITAL_COMMUNITY): Payer: Self-pay

## 2013-08-18 ENCOUNTER — Encounter (HOSPITAL_COMMUNITY): Payer: Self-pay

## 2013-08-20 ENCOUNTER — Encounter (HOSPITAL_COMMUNITY): Payer: Self-pay

## 2013-08-23 ENCOUNTER — Encounter (HOSPITAL_COMMUNITY): Payer: Self-pay

## 2013-08-25 ENCOUNTER — Encounter (HOSPITAL_COMMUNITY)
Admission: RE | Admit: 2013-08-25 | Discharge: 2013-08-25 | Disposition: A | Discharge status: Home / Self Care | Payer: Self-pay | Source: Ambulatory Visit | Attending: Cardiovascular Disease | Admitting: Cardiovascular Disease

## 2013-08-25 DIAGNOSIS — I10 Essential (primary) hypertension: Secondary | ICD-10-CM | POA: Insufficient documentation

## 2013-08-25 DIAGNOSIS — Z5189 Encounter for other specified aftercare: Secondary | ICD-10-CM | POA: Insufficient documentation

## 2013-08-25 DIAGNOSIS — I712 Thoracic aortic aneurysm, without rupture, unspecified: Secondary | ICD-10-CM | POA: Insufficient documentation

## 2013-08-25 DIAGNOSIS — I2510 Atherosclerotic heart disease of native coronary artery without angina pectoris: Secondary | ICD-10-CM | POA: Insufficient documentation

## 2013-08-25 DIAGNOSIS — E785 Hyperlipidemia, unspecified: Secondary | ICD-10-CM | POA: Insufficient documentation

## 2013-08-26 ENCOUNTER — Encounter: Payer: Self-pay | Admitting: Cardiovascular Disease

## 2013-08-30 ENCOUNTER — Encounter (HOSPITAL_COMMUNITY)
Admission: RE | Admit: 2013-08-30 | Discharge: 2013-08-30 | Disposition: A | Discharge status: Home / Self Care | Payer: Self-pay | Source: Ambulatory Visit | Attending: Cardiovascular Disease | Admitting: Cardiovascular Disease

## 2013-09-01 ENCOUNTER — Encounter (HOSPITAL_COMMUNITY): Payer: Self-pay

## 2013-09-03 ENCOUNTER — Encounter (HOSPITAL_COMMUNITY)
Admission: RE | Admit: 2013-09-03 | Discharge: 2013-09-03 | Disposition: A | Discharge status: Home / Self Care | Payer: Self-pay | Source: Ambulatory Visit | Attending: Cardiovascular Disease | Admitting: Cardiovascular Disease

## 2013-09-06 ENCOUNTER — Encounter (HOSPITAL_COMMUNITY)
Admission: RE | Admit: 2013-09-06 | Discharge: 2013-09-06 | Disposition: A | Discharge status: Home / Self Care | Payer: Self-pay | Source: Ambulatory Visit | Attending: Cardiovascular Disease | Admitting: Cardiovascular Disease

## 2013-09-08 ENCOUNTER — Encounter (HOSPITAL_COMMUNITY)
Admission: RE | Admit: 2013-09-08 | Discharge: 2013-09-08 | Disposition: A | Discharge status: Home / Self Care | Payer: Self-pay | Source: Ambulatory Visit | Attending: Cardiovascular Disease | Admitting: Cardiovascular Disease

## 2013-09-10 ENCOUNTER — Encounter (HOSPITAL_COMMUNITY): Payer: Self-pay

## 2013-09-13 ENCOUNTER — Encounter (HOSPITAL_COMMUNITY): Payer: Self-pay

## 2013-09-15 ENCOUNTER — Encounter (HOSPITAL_COMMUNITY): Payer: Self-pay

## 2013-09-17 ENCOUNTER — Encounter (HOSPITAL_COMMUNITY)
Admission: RE | Admit: 2013-09-17 | Discharge: 2013-09-17 | Disposition: A | Discharge status: Home / Self Care | Payer: Self-pay | Source: Ambulatory Visit | Attending: Cardiovascular Disease | Admitting: Cardiovascular Disease

## 2013-09-20 ENCOUNTER — Encounter (HOSPITAL_COMMUNITY)
Admission: RE | Admit: 2013-09-20 | Discharge: 2013-09-20 | Disposition: A | Discharge status: Home / Self Care | Payer: Self-pay | Source: Ambulatory Visit | Attending: Cardiovascular Disease | Admitting: Cardiovascular Disease

## 2013-09-22 ENCOUNTER — Encounter (HOSPITAL_COMMUNITY)
Admission: RE | Admit: 2013-09-22 | Discharge: 2013-09-22 | Disposition: A | Discharge status: Home / Self Care | Payer: Self-pay | Source: Ambulatory Visit | Attending: Cardiovascular Disease | Admitting: Cardiovascular Disease

## 2013-09-24 ENCOUNTER — Encounter (HOSPITAL_COMMUNITY): Payer: Self-pay

## 2013-09-27 ENCOUNTER — Encounter (HOSPITAL_COMMUNITY): Payer: Medicare HMO

## 2013-09-27 DIAGNOSIS — I712 Thoracic aortic aneurysm, without rupture, unspecified: Secondary | ICD-10-CM | POA: Insufficient documentation

## 2013-09-27 DIAGNOSIS — E785 Hyperlipidemia, unspecified: Secondary | ICD-10-CM | POA: Insufficient documentation

## 2013-09-27 DIAGNOSIS — Z5189 Encounter for other specified aftercare: Secondary | ICD-10-CM | POA: Insufficient documentation

## 2013-09-27 DIAGNOSIS — I2510 Atherosclerotic heart disease of native coronary artery without angina pectoris: Secondary | ICD-10-CM | POA: Insufficient documentation

## 2013-09-27 DIAGNOSIS — I10 Essential (primary) hypertension: Secondary | ICD-10-CM | POA: Insufficient documentation

## 2013-09-29 ENCOUNTER — Encounter (HOSPITAL_COMMUNITY): Payer: Self-pay

## 2013-10-01 ENCOUNTER — Encounter (HOSPITAL_COMMUNITY)
Admission: RE | Admit: 2013-10-01 | Discharge: 2013-10-01 | Disposition: A | Discharge status: Home / Self Care | Payer: Self-pay | Source: Ambulatory Visit | Attending: Cardiovascular Disease | Admitting: Cardiovascular Disease

## 2013-10-04 ENCOUNTER — Encounter (HOSPITAL_COMMUNITY)
Admission: RE | Admit: 2013-10-04 | Discharge: 2013-10-04 | Disposition: A | Discharge status: Home / Self Care | Payer: Self-pay | Source: Ambulatory Visit | Attending: Cardiovascular Disease | Admitting: Cardiovascular Disease

## 2013-10-06 ENCOUNTER — Encounter (HOSPITAL_COMMUNITY)
Admission: RE | Admit: 2013-10-06 | Discharge: 2013-10-06 | Disposition: A | Discharge status: Home / Self Care | Payer: Self-pay | Source: Ambulatory Visit | Attending: Cardiovascular Disease | Admitting: Cardiovascular Disease

## 2013-10-08 ENCOUNTER — Encounter (HOSPITAL_COMMUNITY): Payer: Self-pay

## 2013-10-11 ENCOUNTER — Encounter (HOSPITAL_COMMUNITY)
Admission: RE | Admit: 2013-10-11 | Discharge: 2013-10-11 | Disposition: A | Discharge status: Home / Self Care | Payer: Self-pay | Source: Ambulatory Visit | Attending: Cardiovascular Disease | Admitting: Cardiovascular Disease

## 2013-10-13 ENCOUNTER — Encounter (HOSPITAL_COMMUNITY)
Admission: RE | Admit: 2013-10-13 | Discharge: 2013-10-13 | Disposition: A | Discharge status: Home / Self Care | Payer: Self-pay | Source: Ambulatory Visit | Attending: Cardiovascular Disease | Admitting: Cardiovascular Disease

## 2013-10-15 ENCOUNTER — Encounter (HOSPITAL_COMMUNITY): Payer: Self-pay

## 2013-10-18 ENCOUNTER — Encounter (HOSPITAL_COMMUNITY): Payer: Self-pay

## 2013-10-20 ENCOUNTER — Encounter (HOSPITAL_COMMUNITY): Payer: Self-pay

## 2013-10-22 ENCOUNTER — Encounter (HOSPITAL_COMMUNITY): Payer: Self-pay

## 2013-10-25 ENCOUNTER — Encounter (HOSPITAL_COMMUNITY): Payer: Self-pay

## 2013-10-27 ENCOUNTER — Encounter (HOSPITAL_COMMUNITY): Payer: Medicare HMO

## 2013-10-27 DIAGNOSIS — I2510 Atherosclerotic heart disease of native coronary artery without angina pectoris: Secondary | ICD-10-CM | POA: Insufficient documentation

## 2013-10-27 DIAGNOSIS — Z5189 Encounter for other specified aftercare: Secondary | ICD-10-CM | POA: Insufficient documentation

## 2013-10-27 DIAGNOSIS — I712 Thoracic aortic aneurysm, without rupture, unspecified: Secondary | ICD-10-CM | POA: Insufficient documentation

## 2013-10-27 DIAGNOSIS — E785 Hyperlipidemia, unspecified: Secondary | ICD-10-CM | POA: Insufficient documentation

## 2013-10-27 DIAGNOSIS — I10 Essential (primary) hypertension: Secondary | ICD-10-CM | POA: Insufficient documentation

## 2013-10-29 ENCOUNTER — Encounter (HOSPITAL_COMMUNITY): Payer: Self-pay

## 2013-11-03 ENCOUNTER — Encounter (HOSPITAL_COMMUNITY)
Admission: RE | Admit: 2013-11-03 | Discharge: 2013-11-03 | Disposition: A | Discharge status: Home / Self Care | Payer: Self-pay | Source: Ambulatory Visit | Attending: Cardiovascular Disease | Admitting: Cardiovascular Disease

## 2013-11-05 ENCOUNTER — Encounter (HOSPITAL_COMMUNITY)
Admission: RE | Admit: 2013-11-05 | Discharge: 2013-11-05 | Disposition: A | Discharge status: Home / Self Care | Payer: Self-pay | Source: Ambulatory Visit | Attending: Cardiovascular Disease | Admitting: Cardiovascular Disease

## 2013-11-08 ENCOUNTER — Encounter (HOSPITAL_COMMUNITY): Payer: Self-pay

## 2013-11-10 ENCOUNTER — Encounter (HOSPITAL_COMMUNITY)
Admission: RE | Admit: 2013-11-10 | Discharge: 2013-11-10 | Disposition: A | Discharge status: Home / Self Care | Payer: Self-pay | Source: Ambulatory Visit | Attending: Cardiovascular Disease | Admitting: Cardiovascular Disease

## 2013-11-12 ENCOUNTER — Encounter (HOSPITAL_COMMUNITY)
Admission: RE | Admit: 2013-11-12 | Discharge: 2013-11-12 | Disposition: A | Discharge status: Home / Self Care | Payer: Self-pay | Source: Ambulatory Visit | Attending: Cardiovascular Disease | Admitting: Cardiovascular Disease

## 2013-11-15 ENCOUNTER — Encounter (HOSPITAL_COMMUNITY): Payer: Self-pay

## 2013-11-16 ENCOUNTER — Other Ambulatory Visit: Payer: Self-pay

## 2013-11-16 MED ORDER — LISINOPRIL 20 MG PO TABS: ORAL_TABLET | ORAL | Status: DC

## 2013-11-17 ENCOUNTER — Encounter (HOSPITAL_COMMUNITY): Payer: Self-pay

## 2013-11-19 ENCOUNTER — Encounter (HOSPITAL_COMMUNITY)
Admission: RE | Admit: 2013-11-19 | Discharge: 2013-11-19 | Disposition: A | Discharge status: Home / Self Care | Payer: Self-pay | Source: Ambulatory Visit | Attending: Cardiovascular Disease | Admitting: Cardiovascular Disease

## 2013-11-22 ENCOUNTER — Encounter (HOSPITAL_COMMUNITY): Payer: Self-pay

## 2013-11-24 ENCOUNTER — Encounter (HOSPITAL_COMMUNITY)
Admission: RE | Admit: 2013-11-24 | Discharge: 2013-11-24 | Disposition: A | Discharge status: Home / Self Care | Payer: Self-pay | Source: Ambulatory Visit | Attending: Cardiovascular Disease | Admitting: Cardiovascular Disease

## 2013-11-26 ENCOUNTER — Encounter (HOSPITAL_COMMUNITY)
Admission: RE | Admit: 2013-11-26 | Discharge: 2013-11-26 | Disposition: A | Discharge status: Home / Self Care | Payer: Self-pay | Source: Ambulatory Visit | Attending: Cardiovascular Disease | Admitting: Cardiovascular Disease

## 2013-11-26 DIAGNOSIS — I712 Thoracic aortic aneurysm, without rupture: Secondary | ICD-10-CM | POA: Insufficient documentation

## 2013-11-26 DIAGNOSIS — E785 Hyperlipidemia, unspecified: Secondary | ICD-10-CM | POA: Insufficient documentation

## 2013-11-26 DIAGNOSIS — I2510 Atherosclerotic heart disease of native coronary artery without angina pectoris: Secondary | ICD-10-CM | POA: Insufficient documentation

## 2013-11-26 DIAGNOSIS — I10 Essential (primary) hypertension: Secondary | ICD-10-CM | POA: Insufficient documentation

## 2013-11-26 DIAGNOSIS — Z5189 Encounter for other specified aftercare: Secondary | ICD-10-CM | POA: Insufficient documentation

## 2013-11-29 ENCOUNTER — Encounter (HOSPITAL_COMMUNITY)
Admission: RE | Admit: 2013-11-29 | Discharge: 2013-11-29 | Disposition: A | Discharge status: Home / Self Care | Payer: Self-pay | Source: Ambulatory Visit | Attending: Cardiovascular Disease | Admitting: Cardiovascular Disease

## 2013-12-01 ENCOUNTER — Encounter (HOSPITAL_COMMUNITY): Payer: Self-pay

## 2013-12-03 ENCOUNTER — Encounter (HOSPITAL_COMMUNITY)
Admission: RE | Admit: 2013-12-03 | Discharge: 2013-12-03 | Disposition: A | Discharge status: Home / Self Care | Payer: Self-pay | Source: Ambulatory Visit | Attending: Cardiovascular Disease | Admitting: Cardiovascular Disease

## 2013-12-06 ENCOUNTER — Encounter (HOSPITAL_COMMUNITY)
Admission: RE | Admit: 2013-12-06 | Discharge: 2013-12-06 | Disposition: A | Discharge status: Home / Self Care | Payer: Self-pay | Source: Ambulatory Visit | Attending: Cardiovascular Disease | Admitting: Cardiovascular Disease

## 2013-12-08 ENCOUNTER — Encounter (HOSPITAL_COMMUNITY)
Admission: RE | Admit: 2013-12-08 | Discharge: 2013-12-08 | Disposition: A | Discharge status: Home / Self Care | Payer: Self-pay | Source: Ambulatory Visit | Attending: Cardiovascular Disease | Admitting: Cardiovascular Disease

## 2013-12-10 ENCOUNTER — Encounter (HOSPITAL_COMMUNITY)
Admission: RE | Admit: 2013-12-10 | Discharge: 2013-12-10 | Disposition: A | Discharge status: Home / Self Care | Payer: Self-pay | Source: Ambulatory Visit | Attending: Cardiovascular Disease | Admitting: Cardiovascular Disease

## 2013-12-13 ENCOUNTER — Encounter (HOSPITAL_COMMUNITY): Payer: Self-pay

## 2013-12-15 ENCOUNTER — Encounter (HOSPITAL_COMMUNITY)
Admission: RE | Admit: 2013-12-15 | Discharge: 2013-12-15 | Disposition: A | Discharge status: Home / Self Care | Payer: Self-pay | Source: Ambulatory Visit | Attending: Cardiovascular Disease | Admitting: Cardiovascular Disease

## 2013-12-17 ENCOUNTER — Encounter (HOSPITAL_COMMUNITY): Payer: Self-pay

## 2013-12-20 ENCOUNTER — Encounter (HOSPITAL_COMMUNITY): Payer: Self-pay

## 2013-12-21 ENCOUNTER — Other Ambulatory Visit: Payer: Self-pay | Admitting: Cardiovascular Disease

## 2013-12-22 ENCOUNTER — Encounter (HOSPITAL_COMMUNITY)
Admission: RE | Admit: 2013-12-22 | Discharge: 2013-12-22 | Disposition: A | Discharge status: Home / Self Care | Payer: Self-pay | Source: Ambulatory Visit | Attending: Cardiovascular Disease | Admitting: Cardiovascular Disease

## 2013-12-24 ENCOUNTER — Encounter (HOSPITAL_COMMUNITY)
Admission: RE | Admit: 2013-12-24 | Discharge: 2013-12-24 | Disposition: A | Discharge status: Home / Self Care | Payer: Self-pay | Source: Ambulatory Visit | Attending: Cardiovascular Disease | Admitting: Cardiovascular Disease

## 2013-12-27 ENCOUNTER — Encounter (HOSPITAL_COMMUNITY): Payer: Self-pay

## 2013-12-27 DIAGNOSIS — Z5189 Encounter for other specified aftercare: Secondary | ICD-10-CM | POA: Insufficient documentation

## 2013-12-27 DIAGNOSIS — I2510 Atherosclerotic heart disease of native coronary artery without angina pectoris: Secondary | ICD-10-CM | POA: Insufficient documentation

## 2013-12-27 DIAGNOSIS — E785 Hyperlipidemia, unspecified: Secondary | ICD-10-CM | POA: Insufficient documentation

## 2013-12-27 DIAGNOSIS — I712 Thoracic aortic aneurysm, without rupture: Secondary | ICD-10-CM | POA: Insufficient documentation

## 2013-12-27 DIAGNOSIS — I10 Essential (primary) hypertension: Secondary | ICD-10-CM | POA: Insufficient documentation

## 2013-12-29 ENCOUNTER — Encounter (HOSPITAL_COMMUNITY): Payer: Medicare HMO

## 2013-12-31 ENCOUNTER — Encounter (HOSPITAL_COMMUNITY)
Admission: RE | Admit: 2013-12-31 | Discharge: 2013-12-31 | Disposition: A | Discharge status: Home / Self Care | Payer: Self-pay | Source: Ambulatory Visit | Attending: Cardiovascular Disease | Admitting: Cardiovascular Disease

## 2014-01-03 ENCOUNTER — Encounter (HOSPITAL_COMMUNITY)
Admission: RE | Admit: 2014-01-03 | Discharge: 2014-01-03 | Disposition: A | Discharge status: Home / Self Care | Payer: Self-pay | Source: Ambulatory Visit | Attending: Cardiovascular Disease | Admitting: Cardiovascular Disease

## 2014-01-05 ENCOUNTER — Encounter (HOSPITAL_COMMUNITY): Payer: Self-pay

## 2014-01-07 ENCOUNTER — Encounter (HOSPITAL_COMMUNITY): Payer: Self-pay

## 2014-01-10 ENCOUNTER — Encounter (HOSPITAL_COMMUNITY): Payer: Self-pay

## 2014-01-12 ENCOUNTER — Encounter (HOSPITAL_COMMUNITY): Payer: Self-pay

## 2014-01-14 ENCOUNTER — Encounter (HOSPITAL_COMMUNITY)
Admission: RE | Admit: 2014-01-14 | Discharge: 2014-01-14 | Disposition: A | Discharge status: Home / Self Care | Payer: Self-pay | Source: Ambulatory Visit | Attending: Cardiovascular Disease | Admitting: Cardiovascular Disease

## 2014-01-17 ENCOUNTER — Encounter (HOSPITAL_COMMUNITY): Payer: Self-pay

## 2014-01-19 ENCOUNTER — Encounter (HOSPITAL_COMMUNITY): Payer: Self-pay

## 2014-01-24 ENCOUNTER — Encounter (HOSPITAL_COMMUNITY): Payer: Self-pay

## 2014-01-26 ENCOUNTER — Encounter (HOSPITAL_COMMUNITY): Payer: Medicare HMO

## 2014-01-26 DIAGNOSIS — I10 Essential (primary) hypertension: Secondary | ICD-10-CM | POA: Insufficient documentation

## 2014-01-26 DIAGNOSIS — E785 Hyperlipidemia, unspecified: Secondary | ICD-10-CM | POA: Insufficient documentation

## 2014-01-26 DIAGNOSIS — I712 Thoracic aortic aneurysm, without rupture: Secondary | ICD-10-CM | POA: Insufficient documentation

## 2014-01-26 DIAGNOSIS — Z5189 Encounter for other specified aftercare: Secondary | ICD-10-CM | POA: Insufficient documentation

## 2014-01-26 DIAGNOSIS — I2510 Atherosclerotic heart disease of native coronary artery without angina pectoris: Secondary | ICD-10-CM | POA: Insufficient documentation

## 2014-01-28 ENCOUNTER — Encounter (HOSPITAL_COMMUNITY)
Admission: RE | Admit: 2014-01-28 | Discharge: 2014-01-28 | Disposition: A | Discharge status: Home / Self Care | Payer: Self-pay | Source: Ambulatory Visit | Attending: Cardiovascular Disease | Admitting: Cardiovascular Disease

## 2014-01-31 ENCOUNTER — Encounter (HOSPITAL_COMMUNITY): Payer: Self-pay

## 2014-02-02 ENCOUNTER — Encounter (HOSPITAL_COMMUNITY)
Admission: RE | Admit: 2014-02-02 | Discharge: 2014-02-02 | Disposition: A | Discharge status: Home / Self Care | Payer: Self-pay | Source: Ambulatory Visit | Attending: Cardiovascular Disease | Admitting: Cardiovascular Disease

## 2014-02-03 ENCOUNTER — Encounter: Payer: Self-pay | Admitting: Cardiovascular Disease

## 2014-02-03 ENCOUNTER — Ambulatory Visit (INDEPENDENT_AMBULATORY_CARE_PROVIDER_SITE_OTHER): Payer: Medicare HMO | Admitting: Cardiovascular Disease

## 2014-02-03 VITALS — BP 138/82 | HR 80 | Ht 70.0 in | Wt 205.0 lb

## 2014-02-03 DIAGNOSIS — I712 Thoracic aortic aneurysm, without rupture, unspecified: Secondary | ICD-10-CM

## 2014-02-03 DIAGNOSIS — I779 Disorder of arteries and arterioles, unspecified: Secondary | ICD-10-CM

## 2014-02-03 DIAGNOSIS — I2510 Atherosclerotic heart disease of native coronary artery without angina pectoris: Secondary | ICD-10-CM

## 2014-02-03 DIAGNOSIS — E785 Hyperlipidemia, unspecified: Secondary | ICD-10-CM

## 2014-02-03 DIAGNOSIS — I10 Essential (primary) hypertension: Secondary | ICD-10-CM

## 2014-02-03 DIAGNOSIS — I739 Peripheral vascular disease, unspecified: Secondary | ICD-10-CM

## 2014-02-03 LAB — BASIC METABOLIC PANEL
BUN: 27 mg/dL — ABNORMAL HIGH (ref 6–23)
CHLORIDE: 99 meq/L (ref 96–112)
CO2: 26 mEq/L (ref 19–32)
Calcium: 10 mg/dL (ref 8.4–10.5)
Creatinine, Ser: 1.1 mg/dL (ref 0.4–1.5)
GFR: 73.31 mL/min (ref >60.00)
Glucose, Bld: 94 mg/dL (ref 70–99)
Potassium: 4.4 mEq/L (ref 3.5–5.1)
Sodium: 133 mEq/L — ABNORMAL LOW (ref 135–145)

## 2014-02-03 MED ORDER — NITROGLYCERIN 0.4 MG SL SUBL: 0.4000 mg | SUBLINGUAL_TABLET | SUBLINGUAL | Status: DC | PRN

## 2014-02-03 MED ORDER — METOPROLOL TARTRATE 50 MG PO TABS: 50.0000 mg | ORAL_TABLET | Freq: Two times a day (BID) | ORAL | Status: DC

## 2014-02-03 NOTE — Progress Notes (Signed)
History of Present Illness: 68 yo WM with history of CAD, HLD, HTN, thoracic aortic aneurysm, carotid artery disease who is here today for cardiac follow up. He was admitted to [facility] 01/01/10 with an acute inferior STEMI. Emergent cardiac cath showed severe three vessel CAD. He underwent 3V CABG later that day with Dr. Cyndia Bent performing. (LIMA to LAD, SVG to OM, SVG to RCA). He was admitted to [facility] August 2013 for abdominal pain and felt to have ischemic colitis and his beta blocker dose was reduced. He also had some bradycardia. He was seen by Truitt Merle, NP in January 2014 and was doing well but c/o skipped beats. Event monitor with PVCs, NSR. Stress myoview without ischemia April 2014.   He is here today for follow up. No chest pain, SOB. He feels well overall. He has been exercising in cardiac rehab and doing well. Occasional PVCs.  Primary Care Physician: Jani Gravel   Last Lipid Profile: Followed in primary care and well controlled per pt.   Past Medical History  Diagnosis Date  . Thoracic aortic aneurysm      stable on CT 01/13/10;  Chest/abdominal CTA (11/2011):  3.9 x 3.6 cm prox descending thoracic aorta (stable)  . Hyperlipidemia   . Gout   . GERD (gastroesophageal reflux disease)   . Hypertension   . Ischemic colitis     noted on CT back in August 2013 possible related to hypotension  . ST elevation myocardial infarction (STEMI) of inferior wall 2011    s/p CABG 2011  . Asthma     exercise induced  . PUD (peptic ulcer disease) 1974  . History of kidney stones     no problems with stones  . Anxiety   . Back pain, chronic     gets injections in back  . CAD (coronary artery disease)     a.  prior inferior MI; s/p 3V CABG 01/01/10 (LIMA to LAD, SVG to OM, SVG to RCA- Dr.Bartle);  b. Last Myoview (05/2012):  Normal; EF 70%  . Hx of echocardiogram     Echo (01/2010):  EF 55-60%, ? Apical HK, mild BAE, atrial septal aneurysm.  . PVC's (premature ventricular  contractions)     Event monitor in 04/2012: NSR, PVCs.  . Carotid stenosis     a. Carotid US (01/2011):  Bilateral ICA 0-39% (repeat in 1 year).;  b.  Carotid US (08/175): RICA 93-90%; LICA 3-00%. Follow up 1 year    Past Surgical History  Procedure Laterality Date  . Tonsillectomy    . Excision of deep left neck mass. surgeon:  Valyn Latchford e. newman, [place]  . Cardiac catheterization    . Chest tube insertion  1992    fx lt ribs-collapsed lung  . Eye surgery  both cararacts  . Ear cyst excision Left 09/02/2012    Procedure: EXCISION SEBACEOUS CYST LEFT POSTERIOR LEG;  Surgeon: Pedro Earls, MD;  Location: [facility];  Service: General;  Laterality: Left;  . Coronary artery bypass graft  11/11    X 3 VESSELS  . Hernia repair    . Inguinal hernia repair Right 12/02/2012    Procedure: OPEN RIGHT INGUINAL HERINA;  Surgeon: Pedro Earls, MD;  Location: [facility];  Service: General;  Laterality: Right;  . Insertion of mesh Right 12/02/2012    Procedure: INSERTION OF MESH;  Surgeon: Pedro Earls, MD;  Location: [facility];  Service: General;  Laterality: Right;  Current Outpatient Prescriptions  Medication Sig Dispense Refill  . allopurinol (ZYLOPRIM) 300 MG tablet Take 300 mg by mouth daily.    Marland Kitchen aspirin EC 81 MG tablet Take 81 mg by mouth daily.    . cholecalciferol (VITAMIN D) 1000 UNITS tablet Take 1,000 Units by mouth daily.    Marland Kitchen dexlansoprazole (DEXILANT) 60 MG capsule Take 60 mg by mouth daily.     . diphenhydrAMINE (SOMINEX) 25 MG tablet Take 25 mg by mouth at bedtime as needed for sleep.    Marland Kitchen lisinopril (PRINIVIL,ZESTRIL) 20 MG tablet TAKE 1 TABLET (20 MG TOTAL) BY MOUTH 2 (TWO) TIMES DAILY. 180 tablet 0  . metoprolol (LOPRESSOR) 50 MG tablet TAKE 1 TABLET (50 MG TOTAL) BY MOUTH 2 (TWO) TIMES DAILY. (Patient taking differently: TAKE 1 TABLET (50 MG TOTAL) BY MOUTH IN THE MORNING AND TAKE 1/2 TABLET (25 MG TOTAL) IN THE EVENING) 60 tablet 11  . Multiple  Vitamin (MULTIVITAMIN WITH MINERALS) TABS tablet Take 1 tablet by mouth daily.    . nitroGLYCERIN (NITROSTAT) 0.4 MG SL tablet Place 1 tablet (0.4 mg total) under the tongue every 5 (five) minutes as needed for chest pain. 25 tablet 6  . simvastatin (ZOCOR) 40 MG tablet Take 40 mg by mouth every morning.     . vitamin B-12 (CYANOCOBALAMIN) 100 MCG tablet Take 500 mcg by mouth daily.     . vitamin C (ASCORBIC ACID) 500 MG tablet Take 1,000 mg by mouth daily.      No current facility-administered medications for this visit.    Allergies  Allergen Reactions  . Levaquin [Levofloxacin]     INSOMNIA    History   Social History  . Marital Status: Widowed    Spouse Name: N/A    Number of Children: N/A  . Years of Education: N/A   Occupational History  . Not on file.   Social History Main Topics  . Smoking status: Former Smoker    Quit date: 02/25/1974  . Smokeless tobacco: Not on file  . Alcohol Use: 0.6 oz/week    1 Glasses of wine per week     Comment: weekly  . Drug Use: No  . Sexual Activity: Not on file   Other Topics Concern  . Not on file   Social History Narrative    Family History  Problem Relation Age of Onset  . Heart failure Mother   . Pneumonia Father   . Cancer Sister     lung  . Cancer Maternal Grandmother     pancreatic  . Cancer Sister     esophagus    Review of Systems:  As stated in the HPI and otherwise negative.   BP 138/82 mmHg  Pulse 80  Ht 5\' 10"  (1.778 m)  Wt 205 lb (92.987 kg)  BMI 29.41 kg/m2  Physical Examination: General: Well developed, well nourished, NAD HEENT: OP clear, mucus membranes moist SKIN: warm, dry. No rashes. Neuro: No focal deficits Musculoskeletal: Muscle strength 5/5 all ext Psychiatric: Mood and affect normal Neck: No JVD, no carotid bruits, no thyromegaly, no lymphadenopathy. Lungs:Clear bilaterally, no wheezes, rhonci, crackles Cardiovascular: Regular rate and rhythm. No murmurs, gallops or  rubs. Abdomen:Soft. Bowel sounds present. Non-tender.  Extremities: No lower extremity edema. Pulses are 2 + in the bilateral DP/PT.  EKG: NSR, rate 75 bpm.   Stress myoview April 2014:  Stress Procedure: The patient exercised on the treadmill utilizing the Bruce Protocol for 6:46 minutes. The patient stopped due to fatigue  and denied any chest pain; he did c/o a "quick" pain in his left arm at peak exercise. Technetium 25m Sestamibi was injected at peak exercise and myocardial perfusion imaging was performed after a brief delay.  Stress ECG: No signif ST T wave changes to suggest ischemia 3 Beats NSVT  QPS  Raw Data Images: Soft tissue (diaphragm, bowel activity) underlie heart.  Stress Images: Normal homogeneous uptake.Marland Kitchen  Rest Images: Normal homogeneous uptake in all areas of the myocardium.  Subtraction (SDS): No evidence of ischemia.  Transient Ischemic Dilatation (Normal <1.22): 0.92  Lung/Heart Ratio (Normal <0.45): 0.32  Quantitative Gated Spect Images  QGS EDV: 85 ml  QGS ESV: 25 ml  Impression  Exercise Capacity: Good exercise capacity.  BP Response: Normal blood pressure response.  Clinical Symptoms: No chest pain.  ECG Impression: No significant ST segment change suggestive of ischemia.  Comparison with Prior Nuclear Study: No previous nuclear study performed  Overall Impression: Normal stress nuclear study.  LV Ejection Fraction: 70%. LV Wall Motion: NL LV Function; NL Wall Motion  Assessment and Plan:   1. CAD: Stable. Stress myoview April 2014 without ischemia.  Continue beta blocker, ASA, statin, Ace-inh.   2. HTN: BP controlled. No changes   3. HLD: Continue statin.Lipids well controlled per pt, checked in primary care.   4. Thoracic aortic aneurysm: CTA chest October 2013 with 3.9 by 3.6 proximal descending thoracic aorta. Will need repeat now  5. Palpitations: PVCs more frequent. Increase Lopressor to 50 mg po BID  6. Carotid artery disease: Last dopplers  February 2015 with 40-59% RICA stenosis. 0-24% LICA stenosis. Repeat February 2016.

## 2014-02-03 NOTE — Patient Instructions (Signed)
Your physician wants you to follow-up in:  6 months. You will receive a reminder letter in the mail two months in advance. If you don't receive a letter, please call our office to schedule the follow-up appointment.  Your physician has requested that you have a carotid duplex. This test is an ultrasound of the carotid arteries in your neck. It looks at blood flow through these arteries that supply the brain with blood. Allow one hour for this exam. There are no restrictions or special instructions. To be done in February 2016--after Feb 5.   Non-Cardiac CT Angiography (CTA), is a special type of CT scan that uses a computer to produce multi-dimensional views of major blood vessels throughout the body. In CT angiography, a contrast material is injected through an IV to help visualize the blood vessels  Your physician has recommended you make the following change in your medication:  Change metoprolol tartrate to 50 mg by mouth twice daily.  Lab work to be done today--BMP

## 2014-02-04 ENCOUNTER — Encounter (HOSPITAL_COMMUNITY): Payer: Self-pay

## 2014-02-07 ENCOUNTER — Encounter (HOSPITAL_COMMUNITY)
Admission: RE | Admit: 2014-02-07 | Discharge: 2014-02-07 | Disposition: A | Discharge status: Home / Self Care | Payer: Self-pay | Source: Ambulatory Visit | Attending: Cardiovascular Disease | Admitting: Cardiovascular Disease

## 2014-02-09 ENCOUNTER — Encounter (HOSPITAL_COMMUNITY)
Admission: RE | Admit: 2014-02-09 | Discharge: 2014-02-09 | Disposition: A | Discharge status: Home / Self Care | Payer: Self-pay | Source: Ambulatory Visit | Attending: Cardiovascular Disease | Admitting: Cardiovascular Disease

## 2014-02-10 ENCOUNTER — Inpatient Hospital Stay: Admission: RE | Admit: 2014-02-10 | Payer: Medicare HMO | Source: Ambulatory Visit

## 2014-02-11 ENCOUNTER — Encounter (HOSPITAL_COMMUNITY)
Admission: RE | Admit: 2014-02-11 | Discharge: 2014-02-11 | Disposition: A | Discharge status: Home / Self Care | Payer: Self-pay | Source: Ambulatory Visit | Attending: Cardiovascular Disease | Admitting: Cardiovascular Disease

## 2014-02-14 ENCOUNTER — Inpatient Hospital Stay: Admission: RE | Admit: 2014-02-14 | Payer: Medicare HMO | Source: Ambulatory Visit

## 2014-02-14 ENCOUNTER — Encounter (HOSPITAL_COMMUNITY): Payer: Self-pay

## 2014-02-15 ENCOUNTER — Ambulatory Visit (INDEPENDENT_AMBULATORY_CARE_PROVIDER_SITE_OTHER)
Admission: RE | Admit: 2014-02-15 | Discharge: 2014-02-15 | Disposition: A | Discharge status: Home / Self Care | Payer: Medicare HMO | Source: Ambulatory Visit | Attending: Cardiovascular Disease | Admitting: Cardiovascular Disease

## 2014-02-15 DIAGNOSIS — I712 Thoracic aortic aneurysm, without rupture, unspecified: Secondary | ICD-10-CM

## 2014-02-15 MED ORDER — IOHEXOL 350 MG/ML SOLN
100.0000 mL | Freq: Once | INTRAVENOUS | Status: AC | PRN
  Administered 2014-02-15: 100 mL via INTRAVENOUS

## 2014-02-16 ENCOUNTER — Encounter (HOSPITAL_COMMUNITY)
Admission: RE | Admit: 2014-02-16 | Discharge: 2014-02-16 | Disposition: A | Discharge status: Home / Self Care | Payer: Self-pay | Source: Ambulatory Visit | Attending: Cardiovascular Disease | Admitting: Cardiovascular Disease

## 2014-02-21 ENCOUNTER — Encounter (HOSPITAL_COMMUNITY): Payer: Self-pay

## 2014-02-23 ENCOUNTER — Encounter (HOSPITAL_COMMUNITY)
Admission: RE | Admit: 2014-02-23 | Discharge: 2014-02-23 | Disposition: A | Discharge status: Home / Self Care | Payer: Self-pay | Source: Ambulatory Visit | Attending: Cardiovascular Disease | Admitting: Cardiovascular Disease

## 2014-02-24 ENCOUNTER — Other Ambulatory Visit: Payer: Self-pay | Admitting: Internal Medicine

## 2014-02-24 DIAGNOSIS — R319 Hematuria, unspecified: Secondary | ICD-10-CM

## 2014-02-24 DIAGNOSIS — R109 Unspecified abdominal pain: Secondary | ICD-10-CM

## 2014-02-24 DIAGNOSIS — R1030 Lower abdominal pain, unspecified: Secondary | ICD-10-CM

## 2014-02-25 ENCOUNTER — Other Ambulatory Visit: Payer: Self-pay | Admitting: Cardiovascular Disease

## 2014-02-28 ENCOUNTER — Encounter (HOSPITAL_COMMUNITY): Payer: Medicare Other

## 2014-02-28 DIAGNOSIS — I2510 Atherosclerotic heart disease of native coronary artery without angina pectoris: Secondary | ICD-10-CM | POA: Diagnosis not present

## 2014-02-28 DIAGNOSIS — E785 Hyperlipidemia, unspecified: Secondary | ICD-10-CM | POA: Diagnosis not present

## 2014-02-28 DIAGNOSIS — Z5189 Encounter for other specified aftercare: Secondary | ICD-10-CM | POA: Diagnosis not present

## 2014-02-28 DIAGNOSIS — I712 Thoracic aortic aneurysm, without rupture: Secondary | ICD-10-CM | POA: Diagnosis not present

## 2014-02-28 DIAGNOSIS — I10 Essential (primary) hypertension: Secondary | ICD-10-CM | POA: Diagnosis not present

## 2014-03-01 ENCOUNTER — Ambulatory Visit
Admission: RE | Admit: 2014-03-01 | Discharge: 2014-03-01 | Disposition: A | Discharge status: Home / Self Care | Payer: Medicare Other | Source: Ambulatory Visit | Attending: Internal Medicine | Admitting: Internal Medicine

## 2014-03-01 DIAGNOSIS — R319 Hematuria, unspecified: Secondary | ICD-10-CM

## 2014-03-01 DIAGNOSIS — R109 Unspecified abdominal pain: Secondary | ICD-10-CM

## 2014-03-01 DIAGNOSIS — R1030 Lower abdominal pain, unspecified: Secondary | ICD-10-CM

## 2014-03-02 ENCOUNTER — Encounter (HOSPITAL_COMMUNITY): Payer: Medicare Other

## 2014-03-04 ENCOUNTER — Encounter: Payer: Self-pay | Admitting: Cardiovascular Disease

## 2014-03-04 ENCOUNTER — Encounter (HOSPITAL_COMMUNITY): Payer: Medicare Other

## 2014-03-07 ENCOUNTER — Encounter (HOSPITAL_COMMUNITY)
Admission: RE | Admit: 2014-03-07 | Discharge: 2014-03-07 | Disposition: A | Discharge status: Home / Self Care | Payer: Medicare Other | Source: Ambulatory Visit | Attending: Cardiovascular Disease | Admitting: Cardiovascular Disease

## 2014-03-07 NOTE — Progress Notes (Signed)
Samuel Coleman presented to the cardiac rehab maintenance exercise program denying complaints. He was found to be hypertensive with a standing pressure of 161/100 (left arm automatic cuff) and 144/100 (left arm manual cuff). Upon questioning, Samuel Coleman verbalized compliance with his morning medications and denied an increase in his sodium intake. Samuel Coleman did state that when he was at his primary care provider last Thursday, his "bottem number" was above 90 and his MD told him to double his morning dose of lisinopril. Samuel Coleman said he had not done this because he has a history of ischemic colitis and his GI doctor told him it was "because his blood pressure dropped to low". Patient was encouraged to discuss his concerns ASAP with his physician and to take his medications as prescribed. Patient verbalized understanding. He was not allowed to exercise and discharged with a manual pressure of 144/98. Patient remained asymptomatic at discharge.

## 2014-03-09 ENCOUNTER — Encounter (HOSPITAL_COMMUNITY): Payer: Medicare Other

## 2014-03-11 ENCOUNTER — Encounter (HOSPITAL_COMMUNITY): Payer: Medicare Other

## 2014-03-14 ENCOUNTER — Encounter (HOSPITAL_COMMUNITY): Payer: Medicare Other

## 2014-03-16 ENCOUNTER — Encounter (HOSPITAL_COMMUNITY)
Admission: RE | Admit: 2014-03-16 | Discharge: 2014-03-16 | Disposition: A | Discharge status: Home / Self Care | Payer: Medicare Other | Source: Ambulatory Visit | Attending: Cardiovascular Disease | Admitting: Cardiovascular Disease

## 2014-03-18 ENCOUNTER — Encounter (HOSPITAL_COMMUNITY): Payer: Medicare Other

## 2014-03-21 ENCOUNTER — Encounter (HOSPITAL_COMMUNITY): Payer: Medicare Other

## 2014-03-23 ENCOUNTER — Encounter (HOSPITAL_COMMUNITY)
Admission: RE | Admit: 2014-03-23 | Discharge: 2014-03-23 | Disposition: A | Discharge status: Home / Self Care | Payer: Medicare Other | Source: Ambulatory Visit | Attending: Cardiovascular Disease | Admitting: Cardiovascular Disease

## 2014-03-25 ENCOUNTER — Encounter (HOSPITAL_COMMUNITY): Payer: Medicare Other

## 2014-03-28 ENCOUNTER — Encounter (HOSPITAL_COMMUNITY)
Admission: RE | Admit: 2014-03-28 | Discharge: 2014-03-28 | Disposition: A | Discharge status: Home / Self Care | Payer: Self-pay | Source: Ambulatory Visit | Attending: Cardiovascular Disease | Admitting: Cardiovascular Disease

## 2014-03-28 DIAGNOSIS — I712 Thoracic aortic aneurysm, without rupture: Secondary | ICD-10-CM | POA: Insufficient documentation

## 2014-03-28 DIAGNOSIS — Z5189 Encounter for other specified aftercare: Secondary | ICD-10-CM | POA: Insufficient documentation

## 2014-03-28 DIAGNOSIS — E785 Hyperlipidemia, unspecified: Secondary | ICD-10-CM | POA: Insufficient documentation

## 2014-03-28 DIAGNOSIS — I10 Essential (primary) hypertension: Secondary | ICD-10-CM | POA: Insufficient documentation

## 2014-03-28 DIAGNOSIS — I2510 Atherosclerotic heart disease of native coronary artery without angina pectoris: Secondary | ICD-10-CM | POA: Insufficient documentation

## 2014-03-30 ENCOUNTER — Encounter (HOSPITAL_COMMUNITY): Payer: Self-pay

## 2014-04-01 ENCOUNTER — Encounter (HOSPITAL_COMMUNITY): Payer: Self-pay

## 2014-04-04 ENCOUNTER — Encounter (HOSPITAL_COMMUNITY): Payer: Self-pay

## 2014-04-05 ENCOUNTER — Ambulatory Visit (HOSPITAL_COMMUNITY): Payer: Medicare Other | Attending: Physician Assistant | Admitting: Cardiology

## 2014-04-05 DIAGNOSIS — R51 Headache: Secondary | ICD-10-CM | POA: Diagnosis not present

## 2014-04-05 DIAGNOSIS — Z87891 Personal history of nicotine dependence: Secondary | ICD-10-CM | POA: Diagnosis not present

## 2014-04-05 DIAGNOSIS — I10 Essential (primary) hypertension: Secondary | ICD-10-CM | POA: Diagnosis not present

## 2014-04-05 DIAGNOSIS — I2510 Atherosclerotic heart disease of native coronary artery without angina pectoris: Secondary | ICD-10-CM | POA: Diagnosis not present

## 2014-04-05 DIAGNOSIS — Z951 Presence of aortocoronary bypass graft: Secondary | ICD-10-CM | POA: Insufficient documentation

## 2014-04-05 DIAGNOSIS — R0989 Other specified symptoms and signs involving the circulatory and respiratory systems: Secondary | ICD-10-CM

## 2014-04-05 DIAGNOSIS — E785 Hyperlipidemia, unspecified: Secondary | ICD-10-CM | POA: Diagnosis not present

## 2014-04-05 DIAGNOSIS — I6523 Occlusion and stenosis of bilateral carotid arteries: Secondary | ICD-10-CM | POA: Diagnosis not present

## 2014-04-06 ENCOUNTER — Encounter (HOSPITAL_COMMUNITY): Payer: Self-pay

## 2014-04-06 NOTE — Progress Notes (Signed)
Carotid duplex performed 

## 2014-04-07 ENCOUNTER — Encounter: Payer: Self-pay | Admitting: Physician Assistant

## 2014-04-08 ENCOUNTER — Telehealth: Payer: Self-pay | Admitting: *Deleted

## 2014-04-08 ENCOUNTER — Encounter (HOSPITAL_COMMUNITY): Payer: Self-pay

## 2014-04-08 DIAGNOSIS — I6523 Occlusion and stenosis of bilateral carotid arteries: Secondary | ICD-10-CM

## 2014-04-08 NOTE — Telephone Encounter (Signed)
pt notified about carotid stable and will repeat in 1 yr. pt verbalized understanding.

## 2014-04-11 ENCOUNTER — Encounter (HOSPITAL_COMMUNITY): Payer: Self-pay

## 2014-04-13 ENCOUNTER — Encounter (HOSPITAL_COMMUNITY): Payer: Self-pay

## 2014-04-15 ENCOUNTER — Encounter (HOSPITAL_COMMUNITY)
Admission: RE | Admit: 2014-04-15 | Discharge: 2014-04-15 | Disposition: A | Discharge status: Home / Self Care | Payer: Self-pay | Source: Ambulatory Visit | Attending: Cardiovascular Disease | Admitting: Cardiovascular Disease

## 2014-04-18 ENCOUNTER — Encounter (HOSPITAL_COMMUNITY)
Admission: RE | Admit: 2014-04-18 | Discharge: 2014-04-18 | Disposition: A | Discharge status: Home / Self Care | Payer: Self-pay | Source: Ambulatory Visit | Attending: Cardiovascular Disease | Admitting: Cardiovascular Disease

## 2014-04-20 ENCOUNTER — Encounter (HOSPITAL_COMMUNITY): Payer: Self-pay

## 2014-04-22 ENCOUNTER — Encounter (HOSPITAL_COMMUNITY)
Admission: RE | Admit: 2014-04-22 | Discharge: 2014-04-22 | Disposition: A | Discharge status: Home / Self Care | Payer: Self-pay | Source: Ambulatory Visit | Attending: Cardiovascular Disease | Admitting: Cardiovascular Disease

## 2014-04-25 ENCOUNTER — Encounter (HOSPITAL_COMMUNITY)
Admission: RE | Admit: 2014-04-25 | Discharge: 2014-04-25 | Disposition: A | Discharge status: Home / Self Care | Payer: Self-pay | Source: Ambulatory Visit | Attending: Cardiovascular Disease | Admitting: Cardiovascular Disease

## 2014-04-27 ENCOUNTER — Encounter (HOSPITAL_COMMUNITY): Payer: Medicare Other

## 2014-04-27 DIAGNOSIS — I10 Essential (primary) hypertension: Secondary | ICD-10-CM | POA: Insufficient documentation

## 2014-04-27 DIAGNOSIS — I2510 Atherosclerotic heart disease of native coronary artery without angina pectoris: Secondary | ICD-10-CM | POA: Insufficient documentation

## 2014-04-27 DIAGNOSIS — I712 Thoracic aortic aneurysm, without rupture: Secondary | ICD-10-CM | POA: Insufficient documentation

## 2014-04-27 DIAGNOSIS — Z5189 Encounter for other specified aftercare: Secondary | ICD-10-CM | POA: Insufficient documentation

## 2014-04-27 DIAGNOSIS — E785 Hyperlipidemia, unspecified: Secondary | ICD-10-CM | POA: Insufficient documentation

## 2014-04-29 ENCOUNTER — Encounter (HOSPITAL_COMMUNITY): Payer: Self-pay

## 2014-05-02 ENCOUNTER — Encounter (HOSPITAL_COMMUNITY)
Admission: RE | Admit: 2014-05-02 | Discharge: 2014-05-02 | Disposition: A | Discharge status: Home / Self Care | Payer: Self-pay | Source: Ambulatory Visit | Attending: Cardiovascular Disease | Admitting: Cardiovascular Disease

## 2014-05-04 ENCOUNTER — Encounter (HOSPITAL_COMMUNITY): Payer: Self-pay

## 2014-05-06 ENCOUNTER — Encounter (HOSPITAL_COMMUNITY)
Admission: RE | Admit: 2014-05-06 | Discharge: 2014-05-06 | Disposition: A | Discharge status: Home / Self Care | Payer: Self-pay | Source: Ambulatory Visit | Attending: Cardiovascular Disease | Admitting: Cardiovascular Disease

## 2014-05-09 ENCOUNTER — Encounter (HOSPITAL_COMMUNITY)
Admission: RE | Admit: 2014-05-09 | Discharge: 2014-05-09 | Disposition: A | Discharge status: Home / Self Care | Payer: Self-pay | Source: Ambulatory Visit | Attending: Cardiovascular Disease | Admitting: Cardiovascular Disease

## 2014-05-11 ENCOUNTER — Encounter (HOSPITAL_COMMUNITY): Payer: Self-pay

## 2014-05-13 ENCOUNTER — Encounter (HOSPITAL_COMMUNITY)
Admission: RE | Admit: 2014-05-13 | Discharge: 2014-05-13 | Disposition: A | Discharge status: Home / Self Care | Payer: Self-pay | Source: Ambulatory Visit | Attending: Cardiovascular Disease | Admitting: Cardiovascular Disease

## 2014-05-16 ENCOUNTER — Encounter (HOSPITAL_COMMUNITY): Payer: Self-pay

## 2014-05-18 ENCOUNTER — Encounter (HOSPITAL_COMMUNITY)
Admission: RE | Admit: 2014-05-18 | Discharge: 2014-05-18 | Disposition: A | Discharge status: Home / Self Care | Payer: Self-pay | Source: Ambulatory Visit | Attending: Cardiovascular Disease | Admitting: Cardiovascular Disease

## 2014-05-20 ENCOUNTER — Encounter (HOSPITAL_COMMUNITY): Payer: Self-pay

## 2014-05-23 ENCOUNTER — Encounter (HOSPITAL_COMMUNITY)
Admission: RE | Admit: 2014-05-23 | Discharge: 2014-05-23 | Disposition: A | Discharge status: Home / Self Care | Payer: Self-pay | Source: Ambulatory Visit | Attending: Cardiovascular Disease | Admitting: Cardiovascular Disease

## 2014-05-25 ENCOUNTER — Encounter (HOSPITAL_COMMUNITY): Payer: Self-pay

## 2014-05-27 ENCOUNTER — Encounter (HOSPITAL_COMMUNITY)
Admission: RE | Admit: 2014-05-27 | Discharge: 2014-05-27 | Disposition: A | Discharge status: Home / Self Care | Payer: Self-pay | Source: Ambulatory Visit | Attending: Cardiovascular Disease | Admitting: Cardiovascular Disease

## 2014-05-27 DIAGNOSIS — Z951 Presence of aortocoronary bypass graft: Secondary | ICD-10-CM | POA: Insufficient documentation

## 2014-05-27 DIAGNOSIS — I252 Old myocardial infarction: Secondary | ICD-10-CM | POA: Insufficient documentation

## 2014-05-30 ENCOUNTER — Encounter (HOSPITAL_COMMUNITY): Payer: Self-pay

## 2014-06-01 ENCOUNTER — Encounter (HOSPITAL_COMMUNITY)
Admission: RE | Admit: 2014-06-01 | Discharge: 2014-06-01 | Disposition: A | Discharge status: Home / Self Care | Payer: Self-pay | Source: Ambulatory Visit | Attending: Cardiovascular Disease | Admitting: Cardiovascular Disease

## 2014-06-03 ENCOUNTER — Encounter (HOSPITAL_COMMUNITY)
Admission: RE | Admit: 2014-06-03 | Discharge: 2014-06-03 | Disposition: A | Discharge status: Home / Self Care | Payer: Self-pay | Source: Ambulatory Visit | Attending: Cardiovascular Disease | Admitting: Cardiovascular Disease

## 2014-06-06 ENCOUNTER — Encounter (HOSPITAL_COMMUNITY): Payer: Self-pay

## 2014-06-08 ENCOUNTER — Encounter (HOSPITAL_COMMUNITY)
Admission: RE | Admit: 2014-06-08 | Discharge: 2014-06-08 | Disposition: A | Discharge status: Home / Self Care | Payer: Self-pay | Source: Ambulatory Visit | Attending: Cardiovascular Disease | Admitting: Cardiovascular Disease

## 2014-06-10 ENCOUNTER — Encounter (HOSPITAL_COMMUNITY)
Admission: RE | Admit: 2014-06-10 | Discharge: 2014-06-10 | Disposition: A | Discharge status: Home / Self Care | Payer: Self-pay | Source: Ambulatory Visit | Attending: Cardiovascular Disease | Admitting: Cardiovascular Disease

## 2014-06-13 ENCOUNTER — Encounter (HOSPITAL_COMMUNITY): Payer: Self-pay

## 2014-06-15 ENCOUNTER — Encounter (HOSPITAL_COMMUNITY): Payer: Self-pay

## 2014-06-17 ENCOUNTER — Encounter (HOSPITAL_COMMUNITY)
Admission: RE | Admit: 2014-06-17 | Discharge: 2014-06-17 | Disposition: A | Discharge status: Home / Self Care | Payer: Self-pay | Source: Ambulatory Visit | Attending: Cardiovascular Disease | Admitting: Cardiovascular Disease

## 2014-06-20 ENCOUNTER — Encounter (HOSPITAL_COMMUNITY): Payer: Self-pay

## 2014-06-22 ENCOUNTER — Encounter (HOSPITAL_COMMUNITY)
Admission: RE | Admit: 2014-06-22 | Discharge: 2014-06-22 | Disposition: A | Discharge status: Home / Self Care | Payer: Self-pay | Source: Ambulatory Visit | Attending: Cardiovascular Disease | Admitting: Cardiovascular Disease

## 2014-06-24 ENCOUNTER — Encounter (HOSPITAL_COMMUNITY): Payer: Self-pay

## 2014-06-27 ENCOUNTER — Encounter (HOSPITAL_COMMUNITY): Payer: Medicare Other

## 2014-06-27 DIAGNOSIS — I6523 Occlusion and stenosis of bilateral carotid arteries: Secondary | ICD-10-CM | POA: Insufficient documentation

## 2014-06-27 DIAGNOSIS — R0989 Other specified symptoms and signs involving the circulatory and respiratory systems: Secondary | ICD-10-CM | POA: Insufficient documentation

## 2014-06-29 ENCOUNTER — Encounter (HOSPITAL_COMMUNITY): Payer: Self-pay

## 2014-07-01 ENCOUNTER — Encounter (HOSPITAL_COMMUNITY)
Admission: RE | Admit: 2014-07-01 | Discharge: 2014-07-01 | Disposition: A | Discharge status: Home / Self Care | Payer: Self-pay | Source: Ambulatory Visit | Attending: Cardiovascular Disease | Admitting: Cardiovascular Disease

## 2014-07-04 ENCOUNTER — Encounter (HOSPITAL_COMMUNITY)
Admission: RE | Admit: 2014-07-04 | Discharge: 2014-07-04 | Disposition: A | Discharge status: Home / Self Care | Payer: Self-pay | Source: Ambulatory Visit | Attending: Cardiovascular Disease | Admitting: Cardiovascular Disease

## 2014-07-06 ENCOUNTER — Encounter (HOSPITAL_COMMUNITY)
Admission: RE | Admit: 2014-07-06 | Discharge: 2014-07-06 | Disposition: A | Discharge status: Home / Self Care | Payer: Self-pay | Source: Ambulatory Visit | Attending: Cardiovascular Disease | Admitting: Cardiovascular Disease

## 2014-07-08 ENCOUNTER — Encounter (HOSPITAL_COMMUNITY): Payer: Self-pay

## 2014-07-11 ENCOUNTER — Encounter (HOSPITAL_COMMUNITY): Payer: Self-pay

## 2014-07-12 ENCOUNTER — Telehealth: Payer: Self-pay | Admitting: Cardiovascular Disease

## 2014-07-12 NOTE — Telephone Encounter (Signed)
Pt c/o BP issue: STAT if pt c/o blurred vision, one-sided weakness or slurred speech  1. What are your last 5 BP readings? Pt doesn't have them with him  2. Are you having any other symptoms (ex. Dizziness, headache, blurred vision, passed out)? Slight headaches  3. What is your BP issue? Pt calling stating that his bp has been running high about 150/90, 140/85, 150/85, lowest has been 139/77. Please call back and advise.

## 2014-07-12 NOTE — Telephone Encounter (Signed)
Pat, Can we look for openings in my schedule for June and if he would like we can maybe arrange a quicker f/u appt with an office APP. Thanks, chris

## 2014-07-12 NOTE — Telephone Encounter (Signed)
Pt states that for the last week pt has been experience an increase in BP. One time he was sent home from rehab because of his high BP, because he had not taken his BP medication the night before, but now he has been taking his medications ; his BP continues to be running higher then normal. 150/90,140/85, 150/85 the lowest has been 139/77. Pt last was seen in Dr. Camillia Herter clinic on 02/03/14 his BP then was 138/82. Pt  takes Lisinopril 40 mg in the AM and 20 mg in the PM, Metoprolol 50 mg twice a day. Pt denies any other symptoms except he had SOB when getting out in the cold, none now that the weather is worm. A 6 month F/U visit was made for August 22/16 at 8:45 AM.  Pt would like to have a sooner appointment in June with Dr. Angelena Form. Pt is aware that this message will be send to MD for recommendations.

## 2014-07-13 ENCOUNTER — Encounter (HOSPITAL_COMMUNITY)
Admission: RE | Admit: 2014-07-13 | Discharge: 2014-07-13 | Disposition: A | Discharge status: Home / Self Care | Payer: Self-pay | Source: Ambulatory Visit | Attending: Cardiovascular Disease | Admitting: Cardiovascular Disease

## 2014-07-13 NOTE — Telephone Encounter (Signed)
Spoke with pt. He saw primary care yesterday and blood pressure was 148/100. New blood pressure medication started by primary care.  Pt would like appt in our office in June.  Appt made for pt to see Dr. Angelena Form on August 19, 2014 at 10:30.  He will continue to monitor blood pressure and call us if he needs sooner appt.

## 2014-07-15 ENCOUNTER — Encounter (HOSPITAL_COMMUNITY)
Admission: RE | Admit: 2014-07-15 | Discharge: 2014-07-15 | Disposition: A | Discharge status: Home / Self Care | Payer: Self-pay | Source: Ambulatory Visit | Attending: Cardiovascular Disease | Admitting: Cardiovascular Disease

## 2014-07-18 ENCOUNTER — Encounter (HOSPITAL_COMMUNITY): Payer: Self-pay

## 2014-07-20 ENCOUNTER — Encounter (HOSPITAL_COMMUNITY)
Admission: RE | Admit: 2014-07-20 | Discharge: 2014-07-20 | Disposition: A | Discharge status: Home / Self Care | Payer: Self-pay | Source: Ambulatory Visit | Attending: Cardiovascular Disease | Admitting: Cardiovascular Disease

## 2014-07-22 ENCOUNTER — Encounter (HOSPITAL_COMMUNITY)
Admission: RE | Admit: 2014-07-22 | Discharge: 2014-07-22 | Disposition: A | Discharge status: Home / Self Care | Payer: Self-pay | Source: Ambulatory Visit | Attending: Cardiovascular Disease | Admitting: Cardiovascular Disease

## 2014-07-27 ENCOUNTER — Encounter (HOSPITAL_COMMUNITY)
Admission: RE | Admit: 2014-07-27 | Discharge: 2014-07-27 | Disposition: A | Discharge status: Home / Self Care | Payer: Self-pay | Source: Ambulatory Visit | Attending: Cardiovascular Disease | Admitting: Cardiovascular Disease

## 2014-07-27 DIAGNOSIS — R0989 Other specified symptoms and signs involving the circulatory and respiratory systems: Secondary | ICD-10-CM | POA: Insufficient documentation

## 2014-07-27 DIAGNOSIS — I6523 Occlusion and stenosis of bilateral carotid arteries: Secondary | ICD-10-CM | POA: Insufficient documentation

## 2014-07-29 ENCOUNTER — Encounter (HOSPITAL_COMMUNITY): Payer: Self-pay

## 2014-08-01 ENCOUNTER — Encounter (HOSPITAL_COMMUNITY): Payer: Self-pay

## 2014-08-03 ENCOUNTER — Encounter (HOSPITAL_COMMUNITY): Payer: Self-pay

## 2014-08-05 ENCOUNTER — Encounter (HOSPITAL_COMMUNITY): Payer: Self-pay

## 2014-08-08 ENCOUNTER — Encounter (HOSPITAL_COMMUNITY): Payer: Self-pay

## 2014-08-10 ENCOUNTER — Encounter (HOSPITAL_COMMUNITY): Payer: Self-pay

## 2014-08-12 ENCOUNTER — Encounter (HOSPITAL_COMMUNITY): Payer: Self-pay

## 2014-08-15 ENCOUNTER — Encounter (HOSPITAL_COMMUNITY): Payer: Self-pay

## 2014-08-17 ENCOUNTER — Encounter (HOSPITAL_COMMUNITY): Payer: Self-pay

## 2014-08-19 ENCOUNTER — Encounter (HOSPITAL_COMMUNITY)
Admission: RE | Admit: 2014-08-19 | Discharge: 2014-08-19 | Disposition: A | Discharge status: Home / Self Care | Payer: Self-pay | Source: Ambulatory Visit | Attending: Cardiovascular Disease | Admitting: Cardiovascular Disease

## 2014-08-19 ENCOUNTER — Ambulatory Visit (INDEPENDENT_AMBULATORY_CARE_PROVIDER_SITE_OTHER): Payer: Medicare Other | Admitting: Cardiovascular Disease

## 2014-08-19 ENCOUNTER — Encounter: Payer: Self-pay | Admitting: Cardiovascular Disease

## 2014-08-19 ENCOUNTER — Other Ambulatory Visit: Payer: Self-pay | Admitting: *Deleted

## 2014-08-19 VITALS — BP 110/78 | HR 66 | Ht 70.0 in | Wt 208.0 lb

## 2014-08-19 DIAGNOSIS — I10 Essential (primary) hypertension: Secondary | ICD-10-CM | POA: Diagnosis not present

## 2014-08-19 DIAGNOSIS — E785 Hyperlipidemia, unspecified: Secondary | ICD-10-CM

## 2014-08-19 DIAGNOSIS — Z01810 Encounter for preprocedural cardiovascular examination: Secondary | ICD-10-CM

## 2014-08-19 DIAGNOSIS — I712 Thoracic aortic aneurysm, without rupture, unspecified: Secondary | ICD-10-CM

## 2014-08-19 DIAGNOSIS — I2510 Atherosclerotic heart disease of native coronary artery without angina pectoris: Secondary | ICD-10-CM

## 2014-08-19 DIAGNOSIS — I6523 Occlusion and stenosis of bilateral carotid arteries: Secondary | ICD-10-CM

## 2014-08-19 NOTE — Progress Notes (Signed)
Chief Complaint  Patient presents with  . Leg Swelling      History of Present Illness: 69 yo WM with history of CAD, HLD, HTN, thoracic aortic aneurysm, carotid artery disease who is here today for cardiac follow up. He was admitted to [facility] 01/01/10 with an acute inferior STEMI. Emergent cardiac cath showed severe three vessel CAD. He underwent 3V CABG later that day with Dr. Cyndia Bent performing. (LIMA to LAD, SVG to OM, SVG to RCA). He was admitted to [facility] August 2013 for abdominal pain and felt to have ischemic colitis and his beta blocker dose was reduced. He also had some bradycardia. He was seen by Truitt Merle, NP in January 2014 and was doing well but c/o skipped beats. Event monitor with PVCs, NSR. Stress myoview without ischemia April 2014. BP much better on Lisinopril increased dose.   He is here today for follow up. No chest pain, SOB. He feels well overall. He has been exercising in cardiac rehab and doing well. Occasional PVCs. Mild LE edema at times. Upcoming hernia surgery.   Primary Care Physician: Jani Gravel   Last Lipid Profile: Followed in primary care and well controlled per pt.   Past Medical History  Diagnosis Date  . Thoracic aortic aneurysm      stable on CT 01/13/10;  Chest/abdominal CTA (11/2011):  3.9 x 3.6 cm prox descending thoracic aorta (stable)  . Hyperlipidemia   . Gout   . GERD (gastroesophageal reflux disease)   . Hypertension   . Ischemic colitis     noted on CT back in August 2013 possible related to hypotension  . ST elevation myocardial infarction (STEMI) of inferior wall 2011    s/p CABG 2011  . Asthma     exercise induced  . PUD (peptic ulcer disease) 1974  . History of kidney stones     no problems with stones  . Anxiety   . Back pain, chronic     gets injections in back  . CAD (coronary artery disease)     a.  prior inferior MI; s/p 3V CABG 01/01/10 (LIMA to LAD, SVG to OM, SVG to RCA- Dr.Bartle);  b. Last Myoview  (05/2012):  Normal; EF 70%  . Hx of echocardiogram     Echo (01/2010):  EF 55-60%, ? Apical HK, mild BAE, atrial septal aneurysm.  . PVC's (premature ventricular contractions)     Event monitor in 04/2012: NSR, PVCs.  . Carotid stenosis     a. Carotid US (01/2011):  Bilateral ICA 0-39% (repeat in 1 year).;  b.  Carotid US (0/8657): RICA 84-69%; LICA 6-29%. Follow up 1 year;  c.  Carotid US (2/16):  R 40-59%; L 1-39%; FU 1 year.    Past Surgical History  Procedure Laterality Date  . Tonsillectomy    . Excision of deep left neck mass. surgeon:  Avigail Pilling e. newman, [place]  . Cardiac catheterization    . Chest tube insertion  1992    fx lt ribs-collapsed lung  . Eye surgery  both cararacts  . Ear cyst excision Left 09/02/2012    Procedure: EXCISION SEBACEOUS CYST LEFT POSTERIOR LEG;  Surgeon: Pedro Earls, MD;  Location: [place];  Service: General;  Laterality: Left;  . Coronary artery bypass graft  11/11    X 3 VESSELS  . Hernia repair    . Inguinal hernia repair Right 12/02/2012    Procedure: OPEN RIGHT INGUINAL HERINA;  Surgeon: Rodman Key  Verdie Drown, MD;  Location: [facility];  Service: General;  Laterality: Right;  . Insertion of mesh Right 12/02/2012    Procedure: INSERTION OF MESH;  Surgeon: Pedro Earls, MD;  Location: [facility];  Service: General;  Laterality: Right;    Current Outpatient Prescriptions  Medication Sig Dispense Refill  . allopurinol (ZYLOPRIM) 300 MG tablet Take 300 mg by mouth daily.    Marland Kitchen aspirin EC 81 MG tablet Take 81 mg by mouth daily.    . cholecalciferol (VITAMIN D) 1000 UNITS tablet Take 1,000 Units by mouth daily.    Marland Kitchen dexlansoprazole (DEXILANT) 60 MG capsule Take 60 mg by mouth daily.     . diphenhydrAMINE (SOMINEX) 25 MG tablet Take 25 mg by mouth at bedtime as needed for sleep.    Marland Kitchen HYDROcodone-acetaminophen (NORCO) 10-325 MG per tablet TAKE 1 TABLET BY MOUTH THREE TIMES A DAY AS NEEDED FOR PAIN  0  . lisinopril (PRINIVIL,ZESTRIL) 20  MG tablet TAKE 1 TABLET (20 MG TOTAL) BY MOUTH 2 (TWO) TIMES DAILY. (Patient taking differently: TAKE 1 TABLET (20 MG TOTAL) BY MOUTH 3 (THREE)  TIMES DAILY.) 180 tablet 0  . metoprolol (LOPRESSOR) 50 MG tablet Take 1 tablet (50 mg total) by mouth 2 (two) times daily. 60 tablet 11  . Multiple Vitamin (MULTIVITAMIN WITH MINERALS) TABS tablet Take 1 tablet by mouth daily.    . Naftifine HCl 2 % CREA     . nitroGLYCERIN (NITROSTAT) 0.4 MG SL tablet Place 1 tablet (0.4 mg total) under the tongue every 5 (five) minutes as needed for chest pain. 25 tablet 6  . simvastatin (ZOCOR) 40 MG tablet Take 40 mg by mouth every morning.     . triamcinolone cream (KENALOG) 0.1 %     . vitamin B-12 (CYANOCOBALAMIN) 100 MCG tablet Take 500 mcg by mouth daily.     . vitamin C (ASCORBIC ACID) 500 MG tablet Take 1,000 mg by mouth daily.      No current facility-administered medications for this visit.    Allergies  Allergen Reactions  . Levaquin [Levofloxacin]     INSOMNIA    History   Social History  . Marital Status: Widowed    Spouse Name: N/A  . Number of Children: N/A  . Years of Education: N/A   Occupational History  . Not on file.   Social History Main Topics  . Smoking status: Former Smoker    Quit date: 02/25/1974  . Smokeless tobacco: Not on file  . Alcohol Use: 0.6 oz/week    1 Glasses of wine per week     Comment: weekly  . Drug Use: No  . Sexual Activity: Not on file   Other Topics Concern  . Not on file   Social History Narrative    Family History  Problem Relation Age of Onset  . Heart failure Mother   . Pneumonia Father   . Cancer Sister     lung  . Cancer Maternal Grandmother     pancreatic  . Cancer Sister     esophagus    Review of Systems:  As stated in the HPI and otherwise negative.   BP 110/78 mmHg  Pulse 66  Ht 5\' 10"  (1.778 m)  Wt 208 lb (94.348 kg)  BMI 29.84 kg/m2  Physical Examination: General: Well developed, well nourished, NAD HEENT: OP  clear, mucus membranes moist SKIN: warm, dry. No rashes. Neuro: No focal deficits Musculoskeletal: Muscle strength 5/5 all ext Psychiatric: Mood and affect  normal Neck: No JVD, no carotid bruits, no thyromegaly, no lymphadenopathy. Lungs:Clear bilaterally, no wheezes, rhonci, crackles Cardiovascular: Regular rate and rhythm. No murmurs, gallops or rubs. Abdomen:Soft. Bowel sounds present. Non-tender.  Extremities: No lower extremity edema. Pulses are 2 + in the bilateral DP/PT.  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 The wall of the aneurysm is calcified.  No evidence of aortic dissection or transection. Postoperative changes from CABG.  Innominate artery, right subclavian artery, right common carotid artery, left common carotid artery, left subclavian artery, and bilateral vertebral arteries are patent within the confines of the examination.  Innumerable collateral venous structures in the right axilla and right chest opacified without visible venous stenosis. This may simply represent elevated right heart pressures.  Stable sub cm nodules again, compatible with benign disease. Triangular opacity at the lateral left base on image 59 likely represents volume loss.  No pneumothorax or pleural effusion.  Chronic bilateral rib deformities.  Small gastrohepatic ligament nodes are visualized.  Review of the MIP images confirms the above findings.  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  Stress myoview April 2014:  Stress Procedure: The patient exercised on the treadmill utilizing the Bruce Protocol for 6:46 minutes. The patient stopped due to fatigue and denied any chest pain; he did c/o a "quick" pain in his left arm at peak exercise. Technetium 33m Sestamibi was injected  at peak exercise and myocardial perfusion imaging was performed after a brief delay.  Stress ECG: No signif ST T wave changes to suggest ischemia 3 Beats NSVT  QPS  Raw Data Images: Soft tissue (diaphragm, bowel activity) underlie heart.  Stress Images: Normal homogeneous uptake.Marland Kitchen  Rest Images: Normal homogeneous uptake in all areas of the myocardium.  Subtraction (SDS): No evidence of ischemia.  Transient Ischemic Dilatation (Normal <1.22): 0.92  Lung/Heart Ratio (Normal <0.45): 0.32  Quantitative Gated Spect Images  QGS EDV: 85 ml  QGS ESV: 25 ml  Impression  Exercise Capacity: Good exercise capacity.  BP Response: Normal blood pressure response.  Clinical Symptoms: No chest pain.  ECG Impression: No significant ST segment change suggestive of ischemia.  Comparison with Prior Nuclear Study: No previous nuclear study performed  Overall Impression: Normal stress nuclear study.  LV Ejection Fraction: 70%. LV Wall Motion: NL LV Function; NL Wall Motion  EKG:  EKG is not ordered today. The ekg ordered today demonstrates   Recent Labs: 02/03/2014: BUN 27*; Creatinine, Ser 1.1; Potassium 4.4; Sodium 133*   Lipid Panel No results found for: CHOL, TRIG, HDL, CHOLHDL, VLDL, LDLCALC, LDLDIRECT   Wt Readings from Last 3 Encounters:  08/19/14 208 lb (94.348 kg)  02/03/14 205 lb (92.987 kg)  04/20/13 206 lb 12.8 oz (93.804 kg)     Other studies Reviewed: Additional studies/ records that were reviewed today include: . Review of the above records demonstrates:    Assessment and Plan:   1. CAD: Stable. Stress myoview April 2014 without ischemia.  Continue beta blocker, ASA, statin, Ace-inh.   2. HTN: BP controlled. No changes   3. HLD: Continue statin. Lipids well controlled per pt, checked in primary care.   4. Thoracic aortic aneurysm: CTA chest December 2015 with stable 3.9 proximal descending thoracic aorta.   5. Palpitations: PVCs controlled on higher dose of Lopressor.    6. Carotid  artery disease: Last dopplers February 2016 with 40-59% RICA stenosis. 7-03% LICA stenosis. Repeat February 2017.    7. Pre-operative cardiovascular examination: He can proceed with his planned surgical procedure without ischemic testing.   Current medicines are reviewed at length with the patient today.  The patient does not have concerns regarding medicines.  The following changes have been made:  no change  Labs/ tests ordered today include:  No orders of the defined types were placed in this encounter.    Disposition:   FU with me in 1 year  Signed, Lauree Chandler, MD 08/19/2014 5:24 PM    [address] [place], [place], [place]  [address] Phone: [phone]; Fax: [phone]

## 2014-08-19 NOTE — Patient Instructions (Signed)
Medication Instructions:  Your physician recommends that you continue on your current medications as directed. Please refer to the Current Medication list given to you today.   Labwork: none  Testing/Procedures: Your physician has requested that you have a carotid duplex. This test is an ultrasound of the carotid arteries in your neck. It looks at blood flow through these arteries that supply the brain with blood. Allow one hour for this exam. There are no restrictions or special instructions. To be done in February 2017    Follow-Up: Your physician wants you to follow-up in: 12 months.  You will receive a reminder letter in the mail two months in advance. If you don't receive a letter, please call our office to schedule the follow-up appointment.

## 2014-08-22 ENCOUNTER — Encounter (HOSPITAL_COMMUNITY)
Admission: RE | Admit: 2014-08-22 | Discharge: 2014-08-22 | Disposition: A | Discharge status: Home / Self Care | Payer: Self-pay | Source: Ambulatory Visit | Attending: Cardiovascular Disease | Admitting: Cardiovascular Disease

## 2014-08-22 NOTE — Progress Notes (Signed)
Samuel Coleman presented to cardiac rehab for his exercise session. Samuel Coleman check in BP was 156/99, 158/100. Samuel Coleman stated he took all of his morning medications as prescribed, however he took his first dose of Norco 10-325 for inguinal hernia pain 10/10 approximately 1 hour prior to rehab. He was also experiencing chronic back pain 6/10. Samuel Coleman states he is in the process of scheduling a hernia repair. Samuel Coleman denies any significant change in diet, stress, or medication. He did have a stressful encounter with an MD office this am. After rest his BP decreased to 140/92, HR 66. His pain is now 0/10. Samuel Coleman was discharged without exercising.

## 2014-08-24 ENCOUNTER — Encounter (HOSPITAL_COMMUNITY)
Admission: RE | Admit: 2014-08-24 | Discharge: 2014-08-24 | Disposition: A | Discharge status: Home / Self Care | Payer: Self-pay | Source: Ambulatory Visit | Attending: Cardiovascular Disease | Admitting: Cardiovascular Disease

## 2014-08-25 ENCOUNTER — Ambulatory Visit: Payer: Self-pay | Admitting: Surgery

## 2014-08-25 NOTE — H&P (Signed)
Chief Complaint:  Left inguinal hernia  History of Present Illness:  Samuel Coleman is an 69 y.o. male on whom I have previously performed a right inguinal hernia and presented to [facility] February with a new left inguinal hernia.  He had a CT scan which showed a right inguinal hernia repair to be intact showed a left inguinal hernia that contains the sigmoid colon.  This was prominent and we went ahead and felt that we should go ahead and repair this implant and open repair.  He presented for cardiac clearanceand was cleared by Dr. Lauree Chandler.  He presents for open left inguinal hernia repair.    Past Medical History  Diagnosis Date  . Thoracic aortic aneurysm      stable on CT 01/13/10;  Chest/abdominal CTA (11/2011):  3.9 x 3.6 cm prox descending thoracic aorta (stable)  . Hyperlipidemia   . Gout   . GERD (gastroesophageal reflux disease)   . Hypertension   . Ischemic colitis     noted on CT back in August 2013 possible related to hypotension  . ST elevation myocardial infarction (STEMI) of inferior wall 2011    s/p CABG 2011  . Asthma     exercise induced  . PUD (peptic ulcer disease) 1974  . History of kidney stones     no problems with stones  . Anxiety   . Back pain, chronic     gets injections in back  . CAD (coronary artery disease)     a.  prior inferior MI; s/p 3V CABG 01/01/10 (LIMA to LAD, SVG to OM, SVG to RCA- Dr.Bartle);  b. Last Myoview (05/2012):  Normal; EF 70%  . Hx of echocardiogram     Echo (01/2010):  EF 55-60%, ? Apical HK, mild BAE, atrial septal aneurysm.  . PVC's (premature ventricular contractions)     Event monitor in 04/2012: NSR, PVCs.  . Carotid stenosis     a. Carotid US (01/2011):  Bilateral ICA 0-39% (repeat in 1 year).;  b.  Carotid US (08/8293): RICA 62-13%; LICA 0-86%. Follow up 1 year;  c.  Carotid US (2/16):  R 40-59%; L 1-39%; FU 1 year.    Past Surgical History  Procedure Laterality Date  . Tonsillectomy    . Excision of deep left  neck mass. surgeon:  christopher e. newman, [place]  . Cardiac catheterization    . Chest tube insertion  1992    fx lt ribs-collapsed lung  . Eye surgery  both cararacts  . Ear cyst excision Left 09/02/2012    Procedure: EXCISION SEBACEOUS CYST LEFT POSTERIOR LEG;  Surgeon: Pedro Earls, MD;  Location: [place];  Service: General;  Laterality: Left;  . Coronary artery bypass graft  11/11    X 3 VESSELS  . Hernia repair    . Inguinal hernia repair Right 12/02/2012    Procedure: OPEN RIGHT INGUINAL HERINA;  Surgeon: Pedro Earls, MD;  Location: [facility];  Service: General;  Laterality: Right;  . Insertion of mesh Right 12/02/2012    Procedure: INSERTION OF MESH;  Surgeon: Pedro Earls, MD;  Location: [facility];  Service: General;  Laterality: Right;    Current Outpatient Prescriptions  Medication Sig Dispense Refill  . allopurinol (ZYLOPRIM) 300 MG tablet Take 300 mg by mouth daily.    Marland Kitchen aspirin EC 81 MG tablet Take 81 mg by mouth daily.    . cholecalciferol (VITAMIN D) 1000 UNITS tablet Take 1,000 Units  by mouth daily.    Marland Kitchen dexlansoprazole (DEXILANT) 60 MG capsule Take 60 mg by mouth daily.     . diphenhydrAMINE (SOMINEX) 25 MG tablet Take 25 mg by mouth at bedtime as needed for sleep.    Marland Kitchen HYDROcodone-acetaminophen (NORCO) 10-325 MG per tablet TAKE 1 TABLET BY MOUTH THREE TIMES A DAY AS NEEDED FOR PAIN  0  . lisinopril (PRINIVIL,ZESTRIL) 20 MG tablet TAKE 1 TABLET (20 MG TOTAL) BY MOUTH 2 (TWO) TIMES DAILY. (Patient taking differently: patient taking 2 tabs (40mg ) in the morning and 1 tab (20mg ) in the evening) 180 tablet 0  . metoprolol (LOPRESSOR) 50 MG tablet Take 1 tablet (50 mg total) by mouth 2 (two) times daily. 60 tablet 11  . Multiple Vitamin (MULTIVITAMIN WITH MINERALS) TABS tablet Take 1 tablet by mouth daily.    . Naftifine HCl 2 % CREA     . nitroGLYCERIN (NITROSTAT) 0.4 MG SL tablet Place 1 tablet (0.4 mg total) under the tongue every 5 (five) minutes  as needed for chest pain. 25 tablet 6  . simvastatin (ZOCOR) 40 MG tablet Take 40 mg by mouth every morning.     . triamcinolone cream (KENALOG) 0.1 %     . vitamin B-12 (CYANOCOBALAMIN) 100 MCG tablet Take 500 mcg by mouth daily.     . vitamin C (ASCORBIC ACID) 500 MG tablet Take 1,000 mg by mouth daily.      No current facility-administered medications for this visit.   L[place]quin Family History  Problem Relation Age of Onset  . Heart failure Mother   . Pneumonia Father   . Cancer Sister     lung  . Cancer Maternal Grandmother     pancreatic  . Cancer Sister     esophagus   Social History:   reports that he quit smoking about 40 years ago. He does not have any smokeless tobacco history on file. He reports that he drinks about 0.6 oz of alcohol per week. He reports that he does not use illicit drugs.   REVIEW OF SYSTEMS : Negative except for C problem list  Physical Exam:   There were no vitals taken for this visit. There is no weight on file to calculate BMI.  Gen:  WDWN white male NAD  Neurological: Alert and oriented to person, place, and time. Motor and sensory function is grossly intact  Head: Normocephalic and atraumatic.  Eyes: Conjunctivae are normal. Pupils are equal, round, and reactive to light. No scleral icterus.  Neck: Normal range of motion. Neck supple. No tracheal deviation or thyromegaly present.  Cardiovascular:  SR without murmurs or gallops.  No carotid bruits Breast:  Not examined Respiratory: Effort normal.  No respiratory distress. No chest wall tenderness. Breath sounds normal.  No wheezes, rales or rhonchi.  Abdomen:  Palpable left inguinal hernia GU:  Left inguinal hernia and prior right inguinal hernia that was repaired Musculoskeletal: Normal range of motion. Extremities are nontender. No cyanosis, edema or clubbing noted Lymphadenopathy: No cervical, preauricular, postauricular or axillary adenopathy is present Skin: Skin is warm and dry. No rash  noted. No diaphoresis. No erythema. No pallor. Pscyh: Normal mood and affect. Behavior is normal. Judgment and thought content normal.   LABORATORY RESULTS: No results found. However, due to the size of the patient record, not all encounters were searched. Please check Results Review for a complete set of results.   RADIOLOGY RESULTS: No results found.  Problem List: Patient Active Problem List   Diagnosis  Date Noted  . Carotid stenosis 03/29/2013  . S/P right inguinal hernia repair 12/16/2012  . Chest pain 12/07/2011  . Abdominal pain, other specified site 12/07/2011  . Other and unspecified noninfectious gastroenteritis and colitis(558.9) 10/05/2011  . 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 Right inguinal hernia-scrotum 08/23/2011  . Carotid bruit 01/16/2011  . CAD (coronary artery disease) 07/19/2010  . HYPERLIPIDEMIA 01/15/2010  . GOUT 01/15/2010  . HYPERTENSION 01/15/2010  . THORACIC AORTIC ANEURYSM 01/15/2010    Assessment & Plan: Left inguinal hernia.  For open left inguinal herniorrhaphy    Matt B. Hassell Done, MD, [facility] [phone] beeper [phone]  08/25/2014 11:48 AM

## 2014-08-26 ENCOUNTER — Encounter (HOSPITAL_COMMUNITY): Payer: Medicare Other

## 2014-08-26 DIAGNOSIS — R0989 Other specified symptoms and signs involving the circulatory and respiratory systems: Secondary | ICD-10-CM | POA: Insufficient documentation

## 2014-08-26 DIAGNOSIS — I6523 Occlusion and stenosis of bilateral carotid arteries: Secondary | ICD-10-CM | POA: Insufficient documentation

## 2014-08-27 ENCOUNTER — Other Ambulatory Visit: Payer: Self-pay | Admitting: Cardiovascular Disease

## 2014-08-31 ENCOUNTER — Encounter (HOSPITAL_COMMUNITY): Payer: Self-pay

## 2014-09-02 ENCOUNTER — Encounter (HOSPITAL_COMMUNITY): Payer: Self-pay

## 2014-09-05 ENCOUNTER — Encounter (HOSPITAL_COMMUNITY): Payer: Self-pay

## 2014-09-07 ENCOUNTER — Encounter (HOSPITAL_COMMUNITY): Payer: Self-pay

## 2014-09-09 ENCOUNTER — Encounter (HOSPITAL_COMMUNITY)
Admission: RE | Admit: 2014-09-09 | Discharge: 2014-09-09 | Disposition: A | Discharge status: Home / Self Care | Payer: Self-pay | Source: Ambulatory Visit | Attending: Cardiovascular Disease | Admitting: Cardiovascular Disease

## 2014-09-12 ENCOUNTER — Encounter (HOSPITAL_COMMUNITY): Payer: Self-pay

## 2014-09-14 ENCOUNTER — Encounter (HOSPITAL_COMMUNITY)
Admission: RE | Admit: 2014-09-14 | Discharge: 2014-09-14 | Disposition: A | Discharge status: Home / Self Care | Payer: Self-pay | Source: Ambulatory Visit | Attending: Cardiovascular Disease | Admitting: Cardiovascular Disease

## 2014-09-16 ENCOUNTER — Encounter (HOSPITAL_COMMUNITY)
Admission: RE | Admit: 2014-09-16 | Discharge: 2014-09-16 | Disposition: A | Discharge status: Home / Self Care | Payer: Self-pay | Source: Ambulatory Visit | Attending: Cardiovascular Disease | Admitting: Cardiovascular Disease

## 2014-09-19 ENCOUNTER — Encounter (HOSPITAL_COMMUNITY): Payer: Self-pay

## 2014-09-21 ENCOUNTER — Encounter (HOSPITAL_COMMUNITY): Payer: Self-pay

## 2014-09-23 ENCOUNTER — Encounter (HOSPITAL_COMMUNITY): Payer: Self-pay

## 2014-09-26 ENCOUNTER — Encounter (HOSPITAL_COMMUNITY)
Admission: RE | Admit: 2014-09-26 | Discharge: 2014-09-26 | Disposition: A | Discharge status: Home / Self Care | Payer: Self-pay | Source: Ambulatory Visit | Attending: Cardiovascular Disease | Admitting: Cardiovascular Disease

## 2014-09-26 DIAGNOSIS — R0989 Other specified symptoms and signs involving the circulatory and respiratory systems: Secondary | ICD-10-CM | POA: Insufficient documentation

## 2014-09-26 DIAGNOSIS — I6523 Occlusion and stenosis of bilateral carotid arteries: Secondary | ICD-10-CM | POA: Insufficient documentation

## 2014-09-28 ENCOUNTER — Other Ambulatory Visit: Payer: Self-pay | Admitting: Cardiovascular Disease

## 2014-09-28 ENCOUNTER — Encounter (HOSPITAL_COMMUNITY): Payer: Self-pay

## 2014-09-30 ENCOUNTER — Encounter (HOSPITAL_COMMUNITY): Payer: Self-pay

## 2014-10-03 ENCOUNTER — Encounter (HOSPITAL_COMMUNITY): Payer: Self-pay

## 2014-10-05 ENCOUNTER — Encounter (HOSPITAL_COMMUNITY): Payer: Self-pay

## 2014-10-07 ENCOUNTER — Encounter (HOSPITAL_COMMUNITY): Payer: Self-pay

## 2014-10-08 ENCOUNTER — Other Ambulatory Visit: Payer: Self-pay | Admitting: Cardiovascular Disease

## 2014-10-10 ENCOUNTER — Encounter (HOSPITAL_COMMUNITY): Payer: Self-pay

## 2014-10-12 ENCOUNTER — Encounter (HOSPITAL_COMMUNITY): Payer: Self-pay

## 2014-10-14 ENCOUNTER — Ambulatory Visit: Payer: Self-pay | Admitting: Surgery

## 2014-10-14 ENCOUNTER — Encounter (HOSPITAL_COMMUNITY): Payer: Self-pay

## 2014-10-14 NOTE — H&P (Signed)
Chief Complaint:  Enlarging left scrotal hernia  History of Present Illness:  Samuel Coleman is an 69 y.o. male on whom I have previously repaired a RIH open with mesh.  He presents with an enlarging LIH.  He is scheduled for surgery on September 7.  He is obtained cardiology clearance to undergo the surgery.  Because of his heart always kept him postop and plan to do so overnight.  I answered his questions regarding surgery and he is prepared for an open left inguinal hernia.  This is become much more symptomatic and stings a lot particularly with straining with bowel movements.  Past Medical History  Diagnosis Date  . Thoracic aortic aneurysm      stable on CT 01/13/10;  Chest/abdominal CTA (11/2011):  3.9 x 3.6 cm prox descending thoracic aorta (stable)  . Hyperlipidemia   . Gout   . GERD (gastroesophageal reflux disease)   . Hypertension   . Ischemic colitis     noted on CT back in August 2013 possible related to hypotension  . ST elevation myocardial infarction (STEMI) of inferior wall 2011    s/p CABG 2011  . Asthma     exercise induced  . PUD (peptic ulcer disease) 1974  . History of kidney stones     no problems with stones  . Anxiety   . Back pain, chronic     gets injections in back  . CAD (coronary artery disease)     a.  prior inferior MI; s/p 3V CABG 01/01/10 (LIMA to LAD, SVG to OM, SVG to RCA- Dr.Bartle);  b. Last Myoview (05/2012):  Normal; EF 70%  . Hx of echocardiogram     Echo (01/2010):  EF 55-60%, ? Apical HK, mild BAE, atrial septal aneurysm.  . PVC's (premature ventricular contractions)     Event monitor in 04/2012: NSR, PVCs.  . Carotid stenosis     a. Carotid US (01/2011):  Bilateral ICA 0-39% (repeat in 1 year).;  b.  Carotid US (0/3704): RICA 88-89%; LICA 1-69%. Follow up 1 year;  c.  Carotid US (2/16):  R 40-59%; L 1-39%; FU 1 year.    Past Surgical History  Procedure Laterality Date  . Tonsillectomy    . Excision of deep left neck mass. surgeon:   christopher e. newman, [place]  . Cardiac catheterization    . Chest tube insertion  1992    fx lt ribs-collapsed lung  . Eye surgery  both cararacts  . Ear cyst excision Left 09/02/2012    Procedure: EXCISION SEBACEOUS CYST LEFT POSTERIOR LEG;  Surgeon: Pedro Earls, MD;  Location: [facility];  Service: General;  Laterality: Left;  . Coronary artery bypass graft  11/11    X 3 VESSELS  . Hernia repair    . Inguinal hernia repair Right 12/02/2012    Procedure: OPEN RIGHT INGUINAL HERINA;  Surgeon: Pedro Earls, MD;  Location: [facility];  Service: General;  Laterality: Right;  . Insertion of mesh Right 12/02/2012    Procedure: INSERTION OF MESH;  Surgeon: Pedro Earls, MD;  Location: [facility];  Service: General;  Laterality: Right;    Current Outpatient Prescriptions  Medication Sig Dispense Refill  . allopurinol (ZYLOPRIM) 300 MG tablet Take 300 mg by mouth daily.    Marland Kitchen aspirin EC 81 MG tablet Take 81 mg by mouth daily.    . cholecalciferol (VITAMIN D) 1000 UNITS tablet Take 1,000 Units by mouth daily.    Marland Kitchen  dexlansoprazole (DEXILANT) 60 MG capsule Take 60 mg by mouth daily.     . diphenhydrAMINE (SOMINEX) 25 MG tablet Take 25 mg by mouth at bedtime as needed for sleep.    Marland Kitchen HYDROcodone-acetaminophen (NORCO) 10-325 MG per tablet TAKE 1 TABLET BY MOUTH THREE TIMES A DAY AS NEEDED FOR PAIN  0  . lisinopril (PRINIVIL,ZESTRIL) 20 MG tablet TAKE 1 TABLET (20 MG TOTAL) BY MOUTH 2 (TWO) TIMES DAILY. (Patient taking differently: patient taking 2 tabs (40mg ) in the morning and 1 tab (20mg ) in the evening) 180 tablet 0  . lisinopril (PRINIVIL,ZESTRIL) 20 MG tablet TAKE 1 TABLET BY MOUTH TWICE A DAY 60 tablet 11  . metoprolol (LOPRESSOR) 50 MG tablet Take 1 tablet (50 mg total) by mouth 2 (two) times daily. 60 tablet 11  . Multiple Vitamin (MULTIVITAMIN WITH MINERALS) TABS tablet Take 1 tablet by mouth daily.    . Naftifine HCl 2 % CREA     . nitroGLYCERIN (NITROSTAT) 0.4 MG SL  tablet Place 1 tablet (0.4 mg total) under the tongue every 5 (five) minutes as needed for chest pain. 25 tablet 6  . simvastatin (ZOCOR) 40 MG tablet Take 40 mg by mouth every morning.     . triamcinolone cream (KENALOG) 0.1 %     . vitamin B-12 (CYANOCOBALAMIN) 100 MCG tablet Take 500 mcg by mouth daily.     . vitamin C (ASCORBIC ACID) 500 MG tablet Take 1,000 mg by mouth daily.      No current facility-administered medications for this visit.   Levaquin Family History  Problem Relation Age of Onset  . Heart failure Mother   . Pneumonia Father   . Cancer Sister     lung  . Cancer Maternal Grandmother     pancreatic  . Cancer Sister     esophagus   Social History:   reports that he quit smoking about 40 years ago. He does not have any smokeless tobacco history on file. He reports that he drinks about 0.6 oz of alcohol per week. He reports that he does not use illicit drugs.   REVIEW OF SYSTEMS : Negative except for see problem list  Physical Exam:   There were no vitals taken for this visit. There is no weight on file to calculate BMI.  Gen:  WDWN WM NAD  Neurological: Alert and oriented to person, place, and time. Motor and sensory function is grossly intact  Head: Normocephalic and atraumatic.  Eyes: Conjunctivae are normal. Pupils are equal, round, and reactive to light. No scleral icterus.  Neck: Normal range of motion. Neck supple. No tracheal deviation or thyromegaly present.  Cardiovascular:  SR without murmurs or gallops.  No carotid bruits Breast:  Not examined Respiratory: Effort normal.  No respiratory distress. No chest wall tenderness. Breath sounds normal.  No wheezes, rales or rhonchi.  Abdomen:  Non tender GU:  Large left scrotal hernia.  No RIH recurrence Musculoskeletal: Normal range of motion. Extremities are nontender. No cyanosis, edema or clubbing noted Lymphadenopathy: No cervical, preauricular, postauricular or axillary adenopathy is present Skin:  Skin is warm and dry. No rash noted. No diaphoresis. No erythema. No pallor. Pscyh: Normal mood and affect. Behavior is normal. Judgment and thought content normal.   LABORATORY RESULTS: No results found. However, due to the size of the patient record, not all encounters were searched. Please check Results Review for a complete set of results.   RADIOLOGY RESULTS: No results found.  Problem List: Patient Active  Problem List   Diagnosis Date Noted  . Carotid stenosis 03/29/2013  . S/P right inguinal hernia repair 12/16/2012  . Chest pain 12/07/2011  . Abdominal pain, other specified site 12/07/2011  . Other and unspecified noninfectious gastroenteritis and colitis(558.9) 10/05/2011  . 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 Right inguinal hernia-scrotum 08/23/2011  . Carotid bruit 01/16/2011  . CAD (coronary artery disease) 07/19/2010  . HYPERLIPIDEMIA 01/15/2010  . GOUT 01/15/2010  . HYPERTENSION 01/15/2010  . THORACIC AORTIC ANEURYSM 01/15/2010    Assessment & Plan: [facility];  Plan open LIH repair.      Matt B. Hassell Done, MD, [facility] [phone] beeper [phone]  10/14/2014 9:54 AM

## 2014-10-17 ENCOUNTER — Encounter (HOSPITAL_COMMUNITY)
Admission: RE | Admit: 2014-10-17 | Discharge: 2014-10-17 | Disposition: A | Discharge status: Home / Self Care | Payer: Self-pay | Source: Ambulatory Visit | Attending: Cardiovascular Disease | Admitting: Cardiovascular Disease

## 2014-10-17 ENCOUNTER — Ambulatory Visit: Payer: Medicare Other | Admitting: Cardiovascular Disease

## 2014-10-19 ENCOUNTER — Encounter (HOSPITAL_COMMUNITY)
Admission: RE | Admit: 2014-10-19 | Discharge: 2014-10-19 | Disposition: A | Discharge status: Home / Self Care | Payer: Self-pay | Source: Ambulatory Visit | Attending: Cardiovascular Disease | Admitting: Cardiovascular Disease

## 2014-10-21 ENCOUNTER — Encounter (HOSPITAL_COMMUNITY): Payer: Self-pay

## 2014-10-24 ENCOUNTER — Encounter (HOSPITAL_COMMUNITY)
Admission: RE | Admit: 2014-10-24 | Discharge: 2014-10-24 | Disposition: A | Discharge status: Home / Self Care | Payer: Self-pay | Source: Ambulatory Visit | Attending: Cardiovascular Disease | Admitting: Cardiovascular Disease

## 2014-10-26 ENCOUNTER — Other Ambulatory Visit (HOSPITAL_COMMUNITY): Payer: Self-pay | Admitting: *Deleted

## 2014-10-26 ENCOUNTER — Encounter (HOSPITAL_COMMUNITY): Payer: Self-pay

## 2014-10-26 ENCOUNTER — Other Ambulatory Visit: Payer: Self-pay | Admitting: Cardiovascular Disease

## 2014-10-26 NOTE — Patient Instructions (Addendum)
Samuel Coleman  10/26/2014   Your procedure is scheduled on: Wednesday November 02, 2014  Report to [facility] Main  Entrance take [place]  elevators to 3rd floor to  [place] at 630 AM.  Call this number if you have problems the morning of surgery [phone]   Remember: ONLY 1 PERSON MAY GO WITH YOU TO SHORT STAY TO GET  READY MORNING OF [place].  Do not eat food or drink liquids :After Midnight.  FLEETS ENEMA NIGHT BEFORE SURGERY   Take these medicines the morning of surgery with A SIP OF WATER: Metoprolol, Allopurinol, Dexlansoprazole (Dexilant)                               You may not have any metal on your body including hair pins and              piercings  Do not wear jewelry,, lotions, powders or perfumes, deodorant                        Men may shave face and neck.   Do not bring valuables to the hospital. [place].  Contacts, dentures or bridgework may not be worn into surgery.      Patients discharged the day of surgery will not be allowed to drive home.  Name and phone number of your driver:  Special Instructions: coughing and deep breathing exercises, leg exercises              Please read over the following fact sheets you were given: _____________________________________________________________________             [facility] - Preparing for Surgery Before surgery, you can play an important role.  Because skin is not sterile, your skin needs to be as free of germs as possible.  You can reduce the number of germs on your skin by washing with CHG (chlorahexidine gluconate) soap before surgery.  CHG is an antiseptic cleaner which kills germs and bonds with the skin to continue killing germs even after washing. Please DO NOT use if you have an allergy to CHG or antibacterial soaps.  If your skin becomes reddened/irritated stop using the CHG and inform your nurse when you arrive  at Short Stay. Do not shave (including legs and underarms) for at least 48 hours prior to the first CHG shower.  You may shave your face/neck. Please follow these instructions carefully:  1.  Shower with CHG Soap the night before surgery and the  morning of Surgery.  2.  If you choose to wash your hair, wash your hair first as usual with your  normal  shampoo.  3.  After you shampoo, rinse your hair and body thoroughly to remove the  shampoo.                           4.  Use CHG as you would any other liquid soap.  You can apply chg directly  to the skin and wash                       Gently with a scrungie or clean washcloth.  5.  Apply the CHG Soap to your body ONLY FROM THE NECK DOWN.   Do not use on face/ open                           Wound or open sores. Avoid contact with eyes, ears mouth and genitals (private parts).                       Wash face,  Genitals (private parts) with your normal soap.             6.  Wash thoroughly, paying special attention to the area where your surgery  will be performed.  7.  Thoroughly rinse your body with warm water from the neck down.  8.  DO NOT shower/wash with your normal soap after using and rinsing off  the CHG Soap.                9.  Pat yourself dry with a clean towel.            10.  Wear clean pajamas.            11.  Place clean sheets on your bed the night of your first shower and do not  sleep with pets. Day of Surgery : Do not apply any lotions/deodorants the morning of surgery.  Please wear clean clothes to the hospital/surgery center.  FAILURE TO FOLLOW THESE INSTRUCTIONS MAY RESULT IN THE CANCELLATION OF YOUR SURGERY PATIENT SIGNATURE_________________________________  NURSE SIGNATURE__________________________________  ________________________________________________________________________

## 2014-10-27 ENCOUNTER — Encounter (HOSPITAL_COMMUNITY)
Admission: RE | Admit: 2014-10-27 | Discharge: 2014-10-27 | Disposition: A | Discharge status: Home / Self Care | Payer: Medicare Other | Source: Ambulatory Visit | Attending: Surgery | Admitting: Surgery

## 2014-10-27 ENCOUNTER — Encounter (HOSPITAL_COMMUNITY): Payer: Self-pay

## 2014-10-27 DIAGNOSIS — Z01812 Encounter for preprocedural laboratory examination: Secondary | ICD-10-CM | POA: Insufficient documentation

## 2014-10-27 DIAGNOSIS — K4090 Unilateral inguinal hernia, without obstruction or gangrene, not specified as recurrent: Secondary | ICD-10-CM | POA: Insufficient documentation

## 2014-10-27 HISTORY — DX: Pneumonia, unspecified organism: J18.9

## 2014-10-27 HISTORY — DX: Unspecified osteoarthritis, unspecified site: M19.90

## 2014-10-27 LAB — BASIC METABOLIC PANEL
ANION GAP: 7 (ref 5–15)
BUN: 26 mg/dL — ABNORMAL HIGH (ref 6–20)
CO2: 26 mmol/L (ref 22–32)
Calcium: 10.3 mg/dL (ref 8.9–10.3)
Chloride: 106 mmol/L (ref 101–111)
Creatinine, Ser: 1.09 mg/dL (ref 0.61–1.24)
GFR calc Af Amer: >60 mL/min (ref >60)
GLUCOSE: 91 mg/dL (ref 65–99)
POTASSIUM: 4.7 mmol/L (ref 3.5–5.1)
Sodium: 139 mmol/L (ref 135–145)

## 2014-10-27 LAB — CBC
HEMATOCRIT: 44.6 % (ref 39.0–52.0)
HEMOGLOBIN: 15.3 g/dL (ref 13.0–17.0)
MCH: 33.4 pg (ref 26.0–34.0)
MCHC: 34.3 g/dL (ref 30.0–36.0)
MCV: 97.4 fL (ref 78.0–100.0)
Platelets: 195 10*3/uL (ref 150–400)
RBC: 4.58 MIL/uL (ref 4.22–5.81)
RDW: 13.9 % (ref 11.5–15.5)
WBC: 7.8 10*3/uL (ref 4.0–10.5)

## 2014-10-27 NOTE — Progress Notes (Signed)
10-27-14 - BMP lab results from preop visit on 10-27-14 faxed to Dr. Johnathan Hausen via [facility]

## 2014-10-27 NOTE — Progress Notes (Signed)
   10/27/14 0931  OBSTRUCTIVE SLEEP APNEA  Have you ever been diagnosed with sleep apnea through a sleep study? No  Do you snore loudly (loud enough to be heard through closed doors)?  1  Do you often feel tired, fatigued, or sleepy during the daytime? 1  Has anyone observed you stop breathing during your sleep? 0  Do you have, or are you being treated for high blood pressure? 1  BMI more than 35 kg/m2? 0  Age over 71 years old? 1  Neck circumference greater than 40 cm/16 inches? 0  Gender: 1  Obstructive Sleep Apnea Score 5

## 2014-10-27 NOTE — Progress Notes (Addendum)
08-19-14 - [facility] - Dr. Julianne Handler (cardio) - EPIC 04-06-14 - Carotid Duplex (3.9 x 3.6 cm  prox dec. Thoracic aneurysm - stable) 06-14-14 - MRI Lumbar Spine - Care Everywhere EPIC 03-01-14 - CT ABD/Pelvis - EPIC  02-03-14 - EKG - EPIC 02-15-14 - CT Angio Chest  06-04-12 - Stress Test - EPIC

## 2014-10-28 ENCOUNTER — Encounter (HOSPITAL_COMMUNITY)
Admission: RE | Admit: 2014-10-28 | Discharge: 2014-10-28 | Disposition: A | Discharge status: Home / Self Care | Payer: Self-pay | Source: Ambulatory Visit | Attending: Cardiovascular Disease | Admitting: Cardiovascular Disease

## 2014-10-28 DIAGNOSIS — R0989 Other specified symptoms and signs involving the circulatory and respiratory systems: Secondary | ICD-10-CM | POA: Insufficient documentation

## 2014-10-28 DIAGNOSIS — I6523 Occlusion and stenosis of bilateral carotid arteries: Secondary | ICD-10-CM | POA: Insufficient documentation

## 2014-11-01 NOTE — Anesthesia Preprocedure Evaluation (Addendum)
Anesthesia Evaluation  Patient identified by MRN, date of birth, ID band Patient awake  General Assessment Comment:Pre-operative cardiovascular examination: He can proceed with his planned surgical procedure without ischemic testing.    Reviewed: Allergy & Precautions, NPO status , Patient's Chart, lab work & pertinent test results  History of Anesthesia Complications Negative for: history of anesthetic complications  Airway Mallampati: II  TM Distance: >3 FB Neck ROM: Full    Dental no notable dental hx. (+) Dental Advisory Given   Pulmonary asthma , former smoker,  breath sounds clear to auscultation  Pulmonary exam normal       Cardiovascular hypertension, Pt. on medications and Pt. on home beta blockers + CAD, + Past MI, + CABG and + Peripheral Vascular Disease  Normal cardiovascular examRhythm:Regular Rate:Normal     Neuro/Psych PSYCHIATRIC DISORDERS Anxiety    GI/Hepatic Neg liver ROS, PUD, GERD-  Medicated and Controlled,  Endo/Other  negative endocrine ROS  Renal/GU negative Renal ROS  negative genitourinary   Musculoskeletal  (+) Arthritis -,   Abdominal   Peds negative pediatric ROS (+)  Hematology negative hematology ROS (+)   Anesthesia Other Findings   Reproductive/Obstetrics negative OB ROS                            Anesthesia Physical Anesthesia Plan  ASA: III  Anesthesia Plan: General   Post-op Pain Management:    Induction: Intravenous  Airway Management Planned: LMA  Additional Equipment:   Intra-op Plan:   Post-operative Plan: Extubation in OR  Informed Consent: I have reviewed the patients History and Physical, chart, labs and discussed the procedure including the risks, benefits and alternatives for the proposed anesthesia with the patient or authorized representative who has indicated his/her understanding and acceptance.   Dental advisory  given  Plan Discussed with:   Anesthesia Plan Comments:        Anesthesia Quick Evaluation

## 2014-11-02 ENCOUNTER — Ambulatory Visit (HOSPITAL_COMMUNITY): Payer: Medicare Other | Admitting: Anesthesiology

## 2014-11-02 ENCOUNTER — Encounter (HOSPITAL_COMMUNITY): Payer: Self-pay | Admitting: Anesthesiology

## 2014-11-02 ENCOUNTER — Encounter (HOSPITAL_COMMUNITY): Payer: Self-pay

## 2014-11-02 ENCOUNTER — Observation Stay (HOSPITAL_COMMUNITY)
Admission: RE | Admit: 2014-11-02 | Discharge: 2014-11-03 | Disposition: A | Discharge status: Home / Self Care | Payer: Medicare Other | Source: Ambulatory Visit | Attending: Surgery | Admitting: Surgery

## 2014-11-02 ENCOUNTER — Encounter (HOSPITAL_COMMUNITY)
Admission: RE | Disposition: A | Discharge status: Home / Self Care | Payer: Self-pay | Source: Ambulatory Visit | Attending: Surgery

## 2014-11-02 DIAGNOSIS — F419 Anxiety disorder, unspecified: Secondary | ICD-10-CM | POA: Diagnosis not present

## 2014-11-02 DIAGNOSIS — I6523 Occlusion and stenosis of bilateral carotid arteries: Secondary | ICD-10-CM | POA: Diagnosis not present

## 2014-11-02 DIAGNOSIS — J45990 Exercise induced bronchospasm: Secondary | ICD-10-CM | POA: Insufficient documentation

## 2014-11-02 DIAGNOSIS — K4090 Unilateral inguinal hernia, without obstruction or gangrene, not specified as recurrent: Secondary | ICD-10-CM | POA: Diagnosis not present

## 2014-11-02 DIAGNOSIS — Z87442 Personal history of urinary calculi: Secondary | ICD-10-CM | POA: Diagnosis not present

## 2014-11-02 DIAGNOSIS — E785 Hyperlipidemia, unspecified: Secondary | ICD-10-CM | POA: Diagnosis not present

## 2014-11-02 DIAGNOSIS — I252 Old myocardial infarction: Secondary | ICD-10-CM | POA: Insufficient documentation

## 2014-11-02 DIAGNOSIS — Z8711 Personal history of peptic ulcer disease: Secondary | ICD-10-CM | POA: Diagnosis not present

## 2014-11-02 DIAGNOSIS — R0989 Other specified symptoms and signs involving the circulatory and respiratory systems: Secondary | ICD-10-CM | POA: Diagnosis not present

## 2014-11-02 DIAGNOSIS — M549 Dorsalgia, unspecified: Secondary | ICD-10-CM | POA: Insufficient documentation

## 2014-11-02 DIAGNOSIS — Z7982 Long term (current) use of aspirin: Secondary | ICD-10-CM | POA: Insufficient documentation

## 2014-11-02 DIAGNOSIS — I10 Essential (primary) hypertension: Secondary | ICD-10-CM | POA: Diagnosis not present

## 2014-11-02 DIAGNOSIS — Z8719 Personal history of other diseases of the digestive system: Secondary | ICD-10-CM

## 2014-11-02 DIAGNOSIS — M109 Gout, unspecified: Secondary | ICD-10-CM | POA: Diagnosis not present

## 2014-11-02 DIAGNOSIS — K219 Gastro-esophageal reflux disease without esophagitis: Secondary | ICD-10-CM | POA: Insufficient documentation

## 2014-11-02 DIAGNOSIS — G8929 Other chronic pain: Secondary | ICD-10-CM | POA: Diagnosis not present

## 2014-11-02 DIAGNOSIS — Z9889 Other specified postprocedural states: Secondary | ICD-10-CM

## 2014-11-02 DIAGNOSIS — Z951 Presence of aortocoronary bypass graft: Secondary | ICD-10-CM | POA: Diagnosis not present

## 2014-11-02 DIAGNOSIS — I712 Thoracic aortic aneurysm, without rupture: Secondary | ICD-10-CM | POA: Insufficient documentation

## 2014-11-02 DIAGNOSIS — I739 Peripheral vascular disease, unspecified: Secondary | ICD-10-CM | POA: Insufficient documentation

## 2014-11-02 DIAGNOSIS — Z881 Allergy status to other antibiotic agents status: Secondary | ICD-10-CM | POA: Diagnosis not present

## 2014-11-02 DIAGNOSIS — I493 Ventricular premature depolarization: Secondary | ICD-10-CM | POA: Diagnosis not present

## 2014-11-02 DIAGNOSIS — M1990 Unspecified osteoarthritis, unspecified site: Secondary | ICD-10-CM | POA: Diagnosis not present

## 2014-11-02 DIAGNOSIS — Z87891 Personal history of nicotine dependence: Secondary | ICD-10-CM | POA: Insufficient documentation

## 2014-11-02 DIAGNOSIS — I2510 Atherosclerotic heart disease of native coronary artery without angina pectoris: Secondary | ICD-10-CM | POA: Insufficient documentation

## 2014-11-02 HISTORY — PX: INGUINAL HERNIA REPAIR: SHX194

## 2014-11-02 LAB — CBC
HEMATOCRIT: 42.5 % (ref 39.0–52.0)
HEMOGLOBIN: 14.2 g/dL (ref 13.0–17.0)
MCH: 32.5 pg (ref 26.0–34.0)
MCHC: 33.4 g/dL (ref 30.0–36.0)
MCV: 97.3 fL (ref 78.0–100.0)
Platelets: 146 10*3/uL — ABNORMAL LOW (ref 150–400)
RBC: 4.37 MIL/uL (ref 4.22–5.81)
RDW: 13.7 % (ref 11.5–15.5)
WBC: 12.9 10*3/uL — AB (ref 4.0–10.5)

## 2014-11-02 LAB — CREATININE, SERUM: CREATININE: 1.04 mg/dL (ref 0.61–1.24)

## 2014-11-02 SURGERY — REPAIR, HERNIA, INGUINAL, ADULT
Anesthesia: General | Laterality: Left

## 2014-11-02 MED ORDER — LACTATED RINGERS IV SOLN
INTRAVENOUS | Status: DC
  Administered 2014-11-02 (×2): 1000 mL via INTRAVENOUS

## 2014-11-02 MED ORDER — FENTANYL CITRATE (PF) 100 MCG/2ML IJ SOLN
25.0000 ug | INTRAMUSCULAR | Status: DC | PRN
  Administered 2014-11-02: 50 ug via INTRAVENOUS

## 2014-11-02 MED ORDER — MIDAZOLAM HCL 2 MG/2ML IJ SOLN
INTRAMUSCULAR | Status: AC
  Filled 2014-11-02: qty 4

## 2014-11-02 MED ORDER — HEPARIN SODIUM (PORCINE) 5000 UNIT/ML IJ SOLN
5000.0000 [IU] | Freq: Once | INTRAMUSCULAR | Status: AC
  Administered 2014-11-02: 5000 [IU] via SUBCUTANEOUS
  Filled 2014-11-02: qty 1

## 2014-11-02 MED ORDER — PROPOFOL 10 MG/ML IV BOLUS
INTRAVENOUS | Status: DC | PRN
  Administered 2014-11-02: 170 mg via INTRAVENOUS

## 2014-11-02 MED ORDER — PROPOFOL 10 MG/ML IV BOLUS
INTRAVENOUS | Status: AC
  Filled 2014-11-02: qty 20

## 2014-11-02 MED ORDER — FENTANYL CITRATE (PF) 100 MCG/2ML IJ SOLN
INTRAMUSCULAR | Status: AC
  Filled 2014-11-02: qty 2

## 2014-11-02 MED ORDER — NITROGLYCERIN 0.4 MG SL SUBL: 0.4000 mg | SUBLINGUAL_TABLET | SUBLINGUAL | Status: DC | PRN

## 2014-11-02 MED ORDER — DEXAMETHASONE SODIUM PHOSPHATE 10 MG/ML IJ SOLN
INTRAMUSCULAR | Status: DC | PRN
  Administered 2014-11-02: 10 mg via INTRAVENOUS

## 2014-11-02 MED ORDER — PANTOPRAZOLE SODIUM 40 MG PO TBEC
40.0000 mg | DELAYED_RELEASE_TABLET | Freq: Every day | ORAL | Status: DC
  Administered 2014-11-03: 40 mg via ORAL
  Filled 2014-11-02: qty 1

## 2014-11-02 MED ORDER — LACTATED RINGERS IV SOLN
INTRAVENOUS | Status: DC | PRN
  Administered 2014-11-02: 08:00:00 via INTRAVENOUS

## 2014-11-02 MED ORDER — CEFAZOLIN SODIUM-DEXTROSE 2-3 GM-% IV SOLR
INTRAVENOUS | Status: AC
  Filled 2014-11-02: qty 50

## 2014-11-02 MED ORDER — FENTANYL CITRATE (PF) 250 MCG/5ML IJ SOLN
INTRAMUSCULAR | Status: AC
  Filled 2014-11-02: qty 25

## 2014-11-02 MED ORDER — CEFAZOLIN SODIUM-DEXTROSE 2-3 GM-% IV SOLR
2.0000 g | INTRAVENOUS | Status: AC
  Administered 2014-11-02: 2 g via INTRAVENOUS

## 2014-11-02 MED ORDER — EPHEDRINE SULFATE 50 MG/ML IJ SOLN
INTRAMUSCULAR | Status: DC | PRN
  Administered 2014-11-02: 5 mg via INTRAVENOUS

## 2014-11-02 MED ORDER — ONDANSETRON HCL 4 MG/2ML IJ SOLN
INTRAMUSCULAR | Status: AC
  Filled 2014-11-02: qty 2

## 2014-11-02 MED ORDER — LIDOCAINE HCL (CARDIAC) 20 MG/ML IV SOLN
INTRAVENOUS | Status: DC | PRN
  Administered 2014-11-02: 60 mg via INTRAVENOUS

## 2014-11-02 MED ORDER — ONDANSETRON HCL 4 MG/2ML IJ SOLN
INTRAMUSCULAR | Status: DC | PRN
  Administered 2014-11-02: 4 mg via INTRAVENOUS

## 2014-11-02 MED ORDER — MIDAZOLAM HCL 5 MG/5ML IJ SOLN
INTRAMUSCULAR | Status: DC | PRN
  Administered 2014-11-02: 1 mg via INTRAVENOUS

## 2014-11-02 MED ORDER — METOPROLOL TARTRATE 50 MG PO TABS
50.0000 mg | ORAL_TABLET | Freq: Two times a day (BID) | ORAL | Status: DC
  Administered 2014-11-02 – 2014-11-03 (×2): 50 mg via ORAL
  Filled 2014-11-02 (×3): qty 1

## 2014-11-02 MED ORDER — CHLORHEXIDINE GLUCONATE 4 % EX LIQD
1.0000 | Freq: Once | CUTANEOUS | Status: DC
Start: 2014-11-02 — End: 2014-11-02

## 2014-11-02 MED ORDER — ONDANSETRON HCL 4 MG/2ML IJ SOLN: 4.0000 mg | Freq: Four times a day (QID) | INTRAMUSCULAR | Status: DC | PRN

## 2014-11-02 MED ORDER — KCL IN DEXTROSE-NACL 20-5-0.45 MEQ/L-%-% IV SOLN
INTRAVENOUS | Status: DC
  Administered 2014-11-02: 14:00:00 via INTRAVENOUS
  Filled 2014-11-02 (×2): qty 1000

## 2014-11-02 MED ORDER — ROCURONIUM BROMIDE 100 MG/10ML IV SOLN
INTRAVENOUS | Status: AC
  Filled 2014-11-02: qty 1

## 2014-11-02 MED ORDER — LIDOCAINE HCL (CARDIAC) 20 MG/ML IV SOLN
INTRAVENOUS | Status: AC
  Filled 2014-11-02: qty 5

## 2014-11-02 MED ORDER — PHENYLEPHRINE HCL 10 MG/ML IJ SOLN
INTRAMUSCULAR | Status: DC | PRN
  Administered 2014-11-02 (×3): 40 ug via INTRAVENOUS

## 2014-11-02 MED ORDER — FENTANYL CITRATE (PF) 100 MCG/2ML IJ SOLN
INTRAMUSCULAR | Status: DC | PRN
  Administered 2014-11-02 (×7): 25 ug via INTRAVENOUS
  Administered 2014-11-02: 50 ug via INTRAVENOUS
  Administered 2014-11-02: 25 ug via INTRAVENOUS

## 2014-11-02 MED ORDER — BUPIVACAINE-EPINEPHRINE (PF) 0.25% -1:200000 IJ SOLN
INTRAMUSCULAR | Status: AC
  Filled 2014-11-02: qty 30

## 2014-11-02 MED ORDER — SODIUM CHLORIDE 0.9 % IJ SOLN
INTRAMUSCULAR | Status: AC
  Filled 2014-11-02: qty 10

## 2014-11-02 MED ORDER — MORPHINE SULFATE (PF) 2 MG/ML IV SOLN: 1.0000 mg | INTRAVENOUS | Status: DC | PRN

## 2014-11-02 MED ORDER — ONDANSETRON HCL 4 MG/2ML IJ SOLN: 4.0000 mg | Freq: Once | INTRAMUSCULAR | Status: DC | PRN

## 2014-11-02 MED ORDER — LISINOPRIL 20 MG PO TABS
20.0000 mg | ORAL_TABLET | Freq: Two times a day (BID) | ORAL | Status: DC
  Administered 2014-11-02 – 2014-11-03 (×3): 20 mg via ORAL
  Filled 2014-11-02 (×5): qty 1

## 2014-11-02 MED ORDER — EPHEDRINE SULFATE 50 MG/ML IJ SOLN
INTRAMUSCULAR | Status: AC
  Filled 2014-11-02: qty 1

## 2014-11-02 MED ORDER — BUPIVACAINE LIPOSOME 1.3 % IJ SUSP
20.0000 mL | Freq: Once | INTRAMUSCULAR | Status: AC
  Administered 2014-11-02: 20 mL
  Filled 2014-11-02: qty 20

## 2014-11-02 MED ORDER — HYDROCODONE-ACETAMINOPHEN 10-325 MG PO TABS
1.0000 | ORAL_TABLET | ORAL | Status: DC | PRN
  Administered 2014-11-02 – 2014-11-03 (×5): 1 via ORAL
  Filled 2014-11-02 (×5): qty 1

## 2014-11-02 MED ORDER — HEPARIN SODIUM (PORCINE) 5000 UNIT/ML IJ SOLN
5000.0000 [IU] | Freq: Three times a day (TID) | INTRAMUSCULAR | Status: DC
  Administered 2014-11-02 – 2014-11-03 (×2): 5000 [IU] via SUBCUTANEOUS
  Filled 2014-11-02 (×5): qty 1

## 2014-11-02 MED ORDER — ONDANSETRON 4 MG PO TBDP: 4.0000 mg | ORAL_TABLET | Freq: Four times a day (QID) | ORAL | Status: DC | PRN

## 2014-11-02 MED ORDER — METOCLOPRAMIDE HCL 5 MG/ML IJ SOLN
INTRAMUSCULAR | Status: DC | PRN
  Administered 2014-11-02: 10 mg via INTRAVENOUS

## 2014-11-02 SURGICAL SUPPLY — 35 items
BENZOIN TINCTURE PRP APPL 2/3 (GAUZE/BANDAGES/DRESSINGS) IMPLANT
BLADE HEX COATED 2.75 (ELECTRODE) ×3 IMPLANT
BLADE SURG 15 STRL LF DISP TIS (BLADE) ×1 IMPLANT
BLADE SURG 15 STRL SS (BLADE) ×2
CLOSURE WOUND 1/2 X4 (GAUZE/BANDAGES/DRESSINGS)
COVER SURGICAL LIGHT HANDLE (MISCELLANEOUS) ×3 IMPLANT
DECANTER SPIKE VIAL GLASS SM (MISCELLANEOUS) IMPLANT
DISSECTOR ROUND CHERRY 3/8 STR (MISCELLANEOUS) IMPLANT
DRAIN PENROSE 18X1/2 LTX STRL (DRAIN) ×3 IMPLANT
DRAPE LAPAROTOMY TRNSV 102X78 (DRAPE) ×3 IMPLANT
ELECT PENCIL ROCKER SW 15FT (MISCELLANEOUS) ×3 IMPLANT
ELECT REM PT RETURN 9FT ADLT (ELECTROSURGICAL) ×3
ELECTRODE REM PT RTRN 9FT ADLT (ELECTROSURGICAL) ×1 IMPLANT
GLOVE BIOGEL M 8.0 STRL (GLOVE) ×9 IMPLANT
GOWN STRL REUS W/TWL XL LVL3 (GOWN DISPOSABLE) ×6 IMPLANT
KIT BASIN OR (CUSTOM PROCEDURE TRAY) ×3 IMPLANT
LIQUID BAND (GAUZE/BANDAGES/DRESSINGS) ×3 IMPLANT
MESH HERNIA 3X6 (Mesh General) ×3 IMPLANT
NEEDLE HYPO 22GX1.5 SAFETY (NEEDLE) ×3 IMPLANT
PACK BASIC VI WITH GOWN DISP (CUSTOM PROCEDURE TRAY) ×3 IMPLANT
SPONGE LAP 4X18 X RAY DECT (DISPOSABLE) ×3 IMPLANT
STAPLER VISISTAT 35W (STAPLE) IMPLANT
STRIP CLOSURE SKIN 1/2X4 (GAUZE/BANDAGES/DRESSINGS) IMPLANT
SUT MON AB 5-0 PS2 18 (SUTURE) ×3 IMPLANT
SUT PROLENE 2 0 CT2 30 (SUTURE) ×12 IMPLANT
SUT SILK 2 0 SH (SUTURE) IMPLANT
SUT VIC AB 2-0 SH 27 (SUTURE) ×2
SUT VIC AB 2-0 SH 27X BRD (SUTURE) ×1 IMPLANT
SUT VIC AB 4-0 SH 18 (SUTURE) ×3 IMPLANT
SUT VICRYL 4-0 (SUTURE) ×3 IMPLANT
SYR 20CC LL (SYRINGE) ×3 IMPLANT
SYR BULB IRRIGATION 50ML (SYRINGE) ×3 IMPLANT
TOWEL OR 17X26 10 PK STRL BLUE (TOWEL DISPOSABLE) ×3 IMPLANT
TOWEL OR NON WOVEN STRL DISP B (DISPOSABLE) ×3 IMPLANT
YANKAUER SUCT BULB TIP 10FT TU (MISCELLANEOUS) ×3 IMPLANT

## 2014-11-02 NOTE — Transfer of Care (Signed)
Immediate Anesthesia Transfer of Care Note  Patient: Samuel Coleman  Procedure(s) Performed: Procedure(s): OPEN LEFT INGUINAL HERNIA REPAIR WITH MESH (Left)  Patient Location: PACU  Anesthesia Type:General  Level of Consciousness: awake, alert , oriented and patient cooperative  Airway & Oxygen Therapy: Patient Spontanous Breathing and Patient connected to face mask oxygen  Post-op Assessment: Report given to RN and Post -op Vital signs reviewed and stable  Post vital signs: Reviewed and stable  Last Vitals:  Filed Vitals:   11/02/14 1052  BP: 148/78  Pulse: 72  Temp: 36.6 C  Resp: 10    Complications: No apparent anesthesia complications

## 2014-11-02 NOTE — Anesthesia Procedure Notes (Signed)
Procedure Name: LMA Insertion Date/Time: 11/02/2014 8:40 AM Performed by: Sherian Maroon A Pre-anesthesia Checklist: Patient identified, Emergency Drugs available, Suction available, Patient being monitored and Timeout performed Patient Re-evaluated:Patient Re-evaluated prior to inductionOxygen Delivery Method: Circle system utilized Preoxygenation: Pre-oxygenation with 100% oxygen Intubation Type: IV induction Ventilation: Mask ventilation without difficulty LMA Size: 4.0 Number of attempts: 1 Dental Injury: Teeth and Oropharynx as per pre-operative assessment

## 2014-11-02 NOTE — Interval H&P Note (Signed)
History and Physical Interval Note:  11/02/2014 8:29 AM  Samuel Coleman  has presented today for surgery, with the diagnosis of LEFT INGUINAL HERNIA  The various methods of treatment have been discussed with the patient and family. After consideration of risks, benefits and other options for treatment, the patient has consented to  Procedure(s): OPEN LEFT INGUINAL HERNIA REPAIR (Left) as a surgical intervention .  The patient's history has been reviewed, patient examined, no change in status, stable for surgery.  I have reviewed the patient's chart and labs.  Questions were answered to the patient's satisfaction.     Hearl Heikes B

## 2014-11-02 NOTE — H&P (View-Only) (Signed)
Chief Complaint:  Enlarging left scrotal hernia  History of Present Illness:  Samuel Coleman is an 69 y.o. male on whom I have previously repaired a RIH open with mesh.  He presents with an enlarging LIH.  He is scheduled for surgery on September 7.  He is obtained cardiology clearance to undergo the surgery.  Because of his heart always kept him postop and plan to do so overnight.  I answered his questions regarding surgery and he is prepared for an open left inguinal hernia.  This is become much more symptomatic and stings a lot particularly with straining with bowel movements.  Past Medical History  Diagnosis Date  . Thoracic aortic aneurysm      stable on CT 01/13/10;  Chest/abdominal CTA (11/2011):  3.9 x 3.6 cm prox descending thoracic aorta (stable)  . Hyperlipidemia   . Gout   . GERD (gastroesophageal reflux disease)   . Hypertension   . Ischemic colitis     noted on CT back in August 2013 possible related to hypotension  . ST elevation myocardial infarction (STEMI) of inferior wall 2011    s/p CABG 2011  . Asthma     exercise induced  . PUD (peptic ulcer disease) 1974  . History of kidney stones     no problems with stones  . Anxiety   . Back pain, chronic     gets injections in back  . CAD (coronary artery disease)     a.  prior inferior MI; s/p 3V CABG 01/01/10 (LIMA to LAD, SVG to OM, SVG to RCA- Dr.Bartle);  b. Last Myoview (05/2012):  Normal; EF 70%  . Hx of echocardiogram     Echo (01/2010):  EF 55-60%, ? Apical HK, mild BAE, atrial septal aneurysm.  . PVC's (premature ventricular contractions)     Event monitor in 04/2012: NSR, PVCs.  . Carotid stenosis     a. Carotid US (01/2011):  Bilateral ICA 0-39% (repeat in 1 year).;  b.  Carotid US (03/2013): RICA 40-59%; LICA 1-39%. Follow up 1 year;  c.  Carotid US (2/16):  R 40-59%; L 1-39%; FU 1 year.    Past Surgical History  Procedure Laterality Date  . Tonsillectomy    . Excision of deep left neck mass. surgeon:   christopher e. newman, m.d.  1995  . Cardiac catheterization    . Chest tube insertion  1992    fx lt ribs-collapsed lung  . Eye surgery  both cararacts  . Ear cyst excision Left 09/02/2012    Procedure: EXCISION SEBACEOUS CYST LEFT POSTERIOR LEG;  Surgeon: Niasha Devins B Ardice Boyan, MD;  Location: [facility];  Service: General;  Laterality: Left;  . Coronary artery bypass graft  11/11    X 3 VESSELS  . Hernia repair    . Inguinal hernia repair Right 12/02/2012    Procedure: OPEN RIGHT INGUINAL HERINA;  Surgeon: Shamiracle Gorden B Shanty Ginty, MD;  Location: [facility];  Service: General;  Laterality: Right;  . Insertion of mesh Right 12/02/2012    Procedure: INSERTION OF MESH;  Surgeon: Sierria Bruney B Amiera Herzberg, MD;  Location: [facility];  Service: General;  Laterality: Right;    Current Outpatient Prescriptions  Medication Sig Dispense Refill  . allopurinol (ZYLOPRIM) 300 MG tablet Take 300 mg by mouth daily.    . aspirin EC 81 MG tablet Take 81 mg by mouth daily.    . cholecalciferol (VITAMIN D) 1000 UNITS tablet Take 1,000 Units by mouth daily.    .   dexlansoprazole (DEXILANT) 60 MG capsule Take 60 mg by mouth daily.     . diphenhydrAMINE (SOMINEX) 25 MG tablet Take 25 mg by mouth at bedtime as needed for sleep.    . HYDROcodone-acetaminophen (NORCO) 10-325 MG per tablet TAKE 1 TABLET BY MOUTH THREE TIMES A DAY AS NEEDED FOR PAIN  0  . lisinopril (PRINIVIL,ZESTRIL) 20 MG tablet TAKE 1 TABLET (20 MG TOTAL) BY MOUTH 2 (TWO) TIMES DAILY. (Patient taking differently: patient taking 2 tabs (40mg) in the morning and 1 tab (20mg) in the evening) 180 tablet 0  . lisinopril (PRINIVIL,ZESTRIL) 20 MG tablet TAKE 1 TABLET BY MOUTH TWICE A DAY 60 tablet 11  . metoprolol (LOPRESSOR) 50 MG tablet Take 1 tablet (50 mg total) by mouth 2 (two) times daily. 60 tablet 11  . Multiple Vitamin (MULTIVITAMIN WITH MINERALS) TABS tablet Take 1 tablet by mouth daily.    . Naftifine HCl 2 % CREA     . nitroGLYCERIN (NITROSTAT) 0.4 MG SL  tablet Place 1 tablet (0.4 mg total) under the tongue every 5 (five) minutes as needed for chest pain. 25 tablet 6  . simvastatin (ZOCOR) 40 MG tablet Take 40 mg by mouth every morning.     . triamcinolone cream (KENALOG) 0.1 %     . vitamin B-12 (CYANOCOBALAMIN) 100 MCG tablet Take 500 mcg by mouth daily.     . vitamin C (ASCORBIC ACID) 500 MG tablet Take 1,000 mg by mouth daily.      No current facility-administered medications for this visit.   [place] Family History  Problem Relation Age of Onset  . Heart failure Mother   . Pneumonia Father   . Cancer Sister     lung  . Cancer Maternal Grandmother     pancreatic  . Cancer Sister     esophagus   Social History:   reports that he quit smoking about 40 years ago. He does not have any smokeless tobacco history on file. He reports that he drinks about 0.6 oz of alcohol per week. He reports that he does not use illicit drugs.   REVIEW OF SYSTEMS : Negative except for see problem list  Physical Exam:   There were no vitals taken for this visit. There is no weight on file to calculate BMI.  Gen:  WDWN WM NAD  Neurological: Alert and oriented to person, place, and time. Motor and sensory function is grossly intact  Head: Normocephalic and atraumatic.  Eyes: Conjunctivae are normal. Pupils are equal, round, and reactive to light. No scleral icterus.  Neck: Normal range of motion. Neck supple. No tracheal deviation or thyromegaly present.  Cardiovascular:  SR without murmurs or gallops.  No carotid bruits Breast:  Not examined Respiratory: Effort normal.  No respiratory distress. No chest wall tenderness. Breath sounds normal.  No wheezes, rales or rhonchi.  Abdomen:  Non tender GU:  Large left scrotal hernia.  No RIH recurrence Musculoskeletal: Normal range of motion. Extremities are nontender. No cyanosis, edema or clubbing noted Lymphadenopathy: No cervical, preauricular, postauricular or axillary adenopathy is present Skin:  Skin is warm and dry. No rash noted. No diaphoresis. No erythema. No pallor. Pscyh: Normal mood and affect. Behavior is normal. Judgment and thought content normal.   LABORATORY RESULTS: No results found. However, due to the size of the patient record, not all encounters were searched. Please check Results Review for a complete set of results.   RADIOLOGY RESULTS: No results found.  Problem List: Patient Active   Problem List   Diagnosis Date Noted  . Carotid stenosis 03/29/2013  . S/P right inguinal hernia repair 12/16/2012  . Chest pain 12/07/2011  . Abdominal pain, other specified site 12/07/2011  . Other and unspecified noninfectious gastroenteritis and colitis(558.9) 10/05/2011  . 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 Right inguinal hernia-scrotum 08/23/2011  . Carotid bruit 01/16/2011  . CAD (coronary artery disease) 07/19/2010  . HYPERLIPIDEMIA 01/15/2010  . GOUT 01/15/2010  . HYPERTENSION 01/15/2010  . THORACIC AORTIC ANEURYSM 01/15/2010    Assessment & Plan: LIH;  Plan open LIH repair.      Matt B. Tavionna Grout, MD, FACS  [facility] [phone] beeper [phone]  10/14/2014 9:54 AM      

## 2014-11-02 NOTE — Brief Op Note (Signed)
11/02/2014  10:48 AM  PATIENT:  Samuel Coleman  69 y.o. male  PRE-OPERATIVE DIAGNOSIS:  LEFT INGUINAL HERNIA  POST-OPERATIVE DIAGNOSIS:  left inguinal hernia  PROCEDURE:  Procedure(s): OPEN LEFT INGUINAL HERNIA REPAIR WITH MESH (Left)  SURGEON:  Surgeon(s) and Role:    * Johnathan Hausen, MD - Primary  PHYSICIAN ASSISTANT:   ASSISTANTS: none   ANESTHESIA:   general  EBL:  Total I/O In: -  Out: 30 [Blood:30]  BLOOD ADMINISTERED:none  DRAINS: none   LOCAL MEDICATIONS USED:  BUPIVICAINE   SPECIMEN:  No Specimen  DISPOSITION OF SPECIMEN:  N/A  COUNTS:  YES  TOURNIQUET:  * No tourniquets in log *  DICTATION: .Other Dictation: Dictation Number [id number]  PLAN OF CARE: Admit for overnight observation  PATIENT DISPOSITION:  PACU - hemodynamically stable.   Delay start of Pharmacological VTE agent (>24hrs) due to surgical blood loss or risk of bleeding: yes

## 2014-11-03 ENCOUNTER — Encounter (HOSPITAL_COMMUNITY): Payer: Self-pay | Admitting: Surgery

## 2014-11-03 DIAGNOSIS — K4090 Unilateral inguinal hernia, without obstruction or gangrene, not specified as recurrent: Secondary | ICD-10-CM | POA: Diagnosis not present

## 2014-11-03 LAB — BASIC METABOLIC PANEL
ANION GAP: 7 (ref 5–15)
BUN: 20 mg/dL (ref 6–20)
CHLORIDE: 104 mmol/L (ref 101–111)
CO2: 26 mmol/L (ref 22–32)
Calcium: 9.4 mg/dL (ref 8.9–10.3)
Creatinine, Ser: 0.86 mg/dL (ref 0.61–1.24)
GFR calc Af Amer: >60 mL/min (ref >60)
GLUCOSE: 145 mg/dL — AB (ref 65–99)
POTASSIUM: 4.2 mmol/L (ref 3.5–5.1)
Sodium: 137 mmol/L (ref 135–145)

## 2014-11-03 LAB — CBC
HEMATOCRIT: 39.3 % (ref 39.0–52.0)
HEMOGLOBIN: 13.6 g/dL (ref 13.0–17.0)
MCH: 33.5 pg (ref 26.0–34.0)
MCHC: 34.6 g/dL (ref 30.0–36.0)
MCV: 96.8 fL (ref 78.0–100.0)
Platelets: 158 10*3/uL (ref 150–400)
RBC: 4.06 MIL/uL — AB (ref 4.22–5.81)
RDW: 13.5 % (ref 11.5–15.5)
WBC: 15.4 10*3/uL — AB (ref 4.0–10.5)

## 2014-11-03 MED ORDER — INFLUENZA VAC SPLIT QUAD 0.5 ML IM SUSY
0.5000 mL | PREFILLED_SYRINGE | Freq: Once | INTRAMUSCULAR | Status: AC
  Administered 2014-11-03: 0.5 mL via INTRAMUSCULAR
  Filled 2014-11-03: qty 0.5

## 2014-11-03 MED ORDER — HYDROCODONE-ACETAMINOPHEN 10-325 MG PO TABS: 1.0000 | ORAL_TABLET | ORAL | Status: DC | PRN

## 2014-11-03 NOTE — Progress Notes (Signed)
Assessment unchanged. Pt verbalized understanding of dc instructions through teach back regarding activities to resume, medications, diet, follow up care as well as when to call the Doctor. Script x 1 given as provided by MD. Discharged via wc to front entrance to meet awaiting vehicle to carry home. Accompanied by relative and NT.

## 2014-11-03 NOTE — Op Note (Signed)
NAMEREFOEL, PALLADINO NO.:  [id number]  MEDICAL RECORD NO.:  [id number]  LOCATION:  4270                         FACILITY:  [facility]  PHYSICIAN:  Isabel Caprice. Hassell Done, MD  DATE OF BIRTH:  14-Mar-1945  DATE OF PROCEDURE:  11/02/2014 DATE OF DISCHARGE:                              OPERATIVE REPORT   PREOPERATIVE DIAGNOSIS:  Left inguinal hernia.  POSTOPERATIVE DIAGNOSIS:  Left indirect sliding hernia containing colon.  PROCEDURE:  Left inguinal herniorrhaphy including ligation of the sac, removal of large fatty lipomatous hernia, placement of mesh, reconstruction of the internal ring.  SURGEON:  Isabel Caprice. Hassell Done, M.D.  ANESTHESIA:  General endotracheal.  DESCRIPTION OF PROCEDURE:  Mr. Rennels was taken to room 4 at [facility] and given general anesthesia.  The abdomen was prepped with PCMX. Previously, we had marked his left side.  An oblique incision was made and carried down to very generous fatty layer.  Identified his external oblique and the external oblique fibers going down to the external ring. I opened these up and mobilized the cord structures.  There was a tremendous amount of fat around this.  I identified the vas deferens and the spermatic vessels.  I put a Penrose around these and then dissected the sac from the cord structures.  The floor was pretty much obscured by the fat, but eventually I had the cord structures separated from this large anterior medial hernia which I opened them with my finger and I dissected it free from the cord structures and found it contained the sigmoid colon.  I removed the back as much of the sac down to the structures as I could and then I closed this with a pursestring suture staying outside of the sigmoid to obliterate the remaining portion of the sac.  I tucked that back inside.  I also separated this large lipomatous hernia that came along with it and I dissected it free from the cord structures.  I made sure it  did not contain any vital structures and was separated from the spermatic vessels and the vas.  I then ligated this and discarded the excess.  I then cut a piece of Marlex mesh to fit and sutured it medially down near the [place] ligament insertion on the pubis.  I tacked it along the inguinal ligament with a running suture and then medially to the internal oblique and then brought it around the cord structures and tacked it to itself with horizontal mattress suture 2-0 Prolene.  I also anchored it laterally to the external oblique.  Prior to doing that, I had tried best to tighten the ring, placing this single 2-0 Prolene medially, again doing this all by feel because there was so much fat and it was very deep and this was more of a hernia done with feel and ability to visualize.  The wound was irrigated and then closed in layers of 4-0 Vicryl and then with a running subcuticular 5-0 Monocryl and LiquiBand.  Prior to completion, I injected the fascial layer with 20 mL of Exparel.  The patient was taken to the recovery room in satisfactory condition and will be kept for overnight observation.     Isabel Caprice  Hassell Done, MD     MBM/MEDQ  D:  11/02/2014  T:  11/03/2014  Job:  [id number]

## 2014-11-03 NOTE — Discharge Summary (Signed)
Physician Discharge Summary  Patient ID: Samuel Coleman MRN: [id number] DOB/AGE: [birth year] 69 y.o.  Admit date: 11/02/2014 Discharge date: 11/03/2014  Admission Diagnoses:  Left inguinal hernia  Discharge Diagnoses:  Left sliding indirect inguinal hernia  Active Problems:   S/P left inguinal hernia repair   Surgery:  Open left inguinal hernia repair  Discharged Condition: improved  Hospital Course:   Had surgery.  Kept overnight.  Minimal pain.  Ready for discharge on PD 1  Consults: none  Significant Diagnostic Studies: none    Discharge Exam: Blood pressure 127/66, pulse 69, temperature 97.8 F (36.6 C), temperature source Oral, resp. rate 12, height 5\' 10"  (1.778 m), weight 90.719 kg (200 lb), SpO2 100 %. Incision closed with Liquiban-intact.  No inguinal or scrotal ecchymoses.    Disposition: 01-Home or Self Care  Discharge Instructions    Diet - low sodium heart healthy    Complete by:  As directed      Discharge instructions    Complete by:  As directed   May shower ad lib     Increase activity slowly    Complete by:  As directed             Medication List    TAKE these medications        allopurinol 300 MG tablet  Commonly known as:  ZYLOPRIM  Take 300 mg by mouth every morning.     aspirin EC 81 MG tablet  Take 81 mg by mouth every morning.     cholecalciferol 1000 UNITS tablet  Commonly known as:  VITAMIN D  Take 1,000 Units by mouth every morning.     clotrimazole 1 % cream  Commonly known as:  LOTRIMIN  Apply 1 application topically 2 (two) times daily.     dexlansoprazole 60 MG capsule  Commonly known as:  DEXILANT  Take 60 mg by mouth every morning.     diphenhydrAMINE 25 MG tablet  Commonly known as:  SOMINEX  Take 25 mg by mouth at bedtime as needed for sleep.     HYDROcodone-acetaminophen 10-325 MG per tablet  Commonly known as:  NORCO  TAKE 1 TABLET BY MOUTH THREE TIMES A DAY AS NEEDED FOR PAIN     HYDROcodone-acetaminophen 10-325 MG per tablet  Commonly known as:  NORCO  Take 1 tablet by mouth every 4 (four) hours as needed for severe pain.     hydrocortisone cream 1 %  Apply 1 application topically 2 (two) times daily.     lisinopril 20 MG tablet  Commonly known as:  PRINIVIL,ZESTRIL  TAKE 1 TABLET (20 MG TOTAL) BY MOUTH 2 (TWO) TIMES DAILY.     lisinopril 20 MG tablet  Commonly known as:  PRINIVIL,ZESTRIL  TAKE 1 TABLET BY MOUTH TWICE A DAY     metoprolol 50 MG tablet  Commonly known as:  LOPRESSOR  Take 1 tablet (50 mg total) by mouth 2 (two) times daily.     metoprolol 50 MG tablet  Commonly known as:  LOPRESSOR  TAKE 1 TABLET (50 MG TOTAL) BY MOUTH 2 (TWO) TIMES DAILY.     multivitamin with minerals Tabs tablet  Take 1 tablet by mouth every morning.     neomycin-bacitracin-polymyxin [phone] ointment  Apply 1 application topically 2 (two) times daily.     nitroGLYCERIN 0.4 MG SL tablet  Commonly known as:  NITROSTAT  Place 1 tablet (0.4 mg total) under the tongue every 5 (five) minutes as needed for chest pain.  simvastatin 40 MG tablet  Commonly known as:  ZOCOR  Take 40 mg by mouth every morning.     vitamin B-12 100 MCG tablet  Commonly known as:  CYANOCOBALAMIN  Take 500 mcg by mouth every morning.     vitamin C 500 MG tablet  Commonly known as:  ASCORBIC ACID  Take 1,000 mg by mouth every morning.           Follow-up Information    Follow up with Johnathan Hausen B, MD In 4 weeks.   Specialty:  General Surgery   Contact information:   [place] [address] [phone]       Signed: Pedro Earls 11/03/2014, 8:20 AM

## 2014-11-03 NOTE — Discharge Instructions (Signed)

## 2014-11-04 ENCOUNTER — Encounter (HOSPITAL_COMMUNITY): Payer: Self-pay

## 2014-11-07 ENCOUNTER — Encounter (HOSPITAL_COMMUNITY): Payer: Self-pay

## 2014-11-07 NOTE — Anesthesia Postprocedure Evaluation (Signed)
  Anesthesia Post-op Note  Patient: Samuel Coleman  Procedure(s) Performed: Procedure(s) (LRB): OPEN LEFT INGUINAL HERNIA REPAIR WITH MESH (Left)      Post-op Assessment: Post-op Vital signs reviewed, Patient's Cardiovascular Status Stable, Respiratory Function Stable, Patent Airway and No signs of Nausea or vomiting  Last Vitals:  Filed Vitals:   11/03/14 1018  BP: 118/65  Pulse: 78  Temp: 37.1 C  Resp: 16    Post-op Vital Signs: stable   Complications: No apparent anesthesia complications

## 2014-11-09 ENCOUNTER — Encounter (HOSPITAL_COMMUNITY): Payer: Self-pay

## 2014-11-11 ENCOUNTER — Encounter (HOSPITAL_COMMUNITY): Payer: Self-pay

## 2014-11-14 ENCOUNTER — Encounter (HOSPITAL_COMMUNITY): Payer: Self-pay

## 2014-11-16 ENCOUNTER — Encounter (HOSPITAL_COMMUNITY): Payer: Self-pay

## 2014-11-18 ENCOUNTER — Encounter (HOSPITAL_COMMUNITY): Payer: Self-pay

## 2014-11-21 ENCOUNTER — Encounter (HOSPITAL_COMMUNITY): Payer: Self-pay

## 2014-11-23 ENCOUNTER — Encounter (HOSPITAL_COMMUNITY): Payer: Self-pay

## 2014-11-25 ENCOUNTER — Encounter (HOSPITAL_COMMUNITY): Payer: Self-pay

## 2014-11-28 ENCOUNTER — Encounter (HOSPITAL_COMMUNITY): Payer: Self-pay

## 2014-11-28 DIAGNOSIS — Z951 Presence of aortocoronary bypass graft: Secondary | ICD-10-CM | POA: Insufficient documentation

## 2014-11-28 DIAGNOSIS — I252 Old myocardial infarction: Secondary | ICD-10-CM | POA: Insufficient documentation

## 2014-11-30 ENCOUNTER — Encounter (HOSPITAL_COMMUNITY): Payer: Self-pay

## 2014-12-02 ENCOUNTER — Encounter (HOSPITAL_COMMUNITY): Payer: Self-pay

## 2014-12-05 ENCOUNTER — Encounter (HOSPITAL_COMMUNITY): Payer: Self-pay

## 2014-12-07 ENCOUNTER — Encounter (HOSPITAL_COMMUNITY): Payer: Self-pay

## 2014-12-09 ENCOUNTER — Encounter (HOSPITAL_COMMUNITY): Payer: Self-pay

## 2014-12-09 ENCOUNTER — Encounter: Payer: Self-pay | Admitting: Cardiovascular Disease

## 2014-12-12 ENCOUNTER — Encounter (HOSPITAL_COMMUNITY): Payer: Self-pay

## 2014-12-14 ENCOUNTER — Encounter (HOSPITAL_COMMUNITY): Payer: Self-pay

## 2014-12-16 ENCOUNTER — Encounter (HOSPITAL_COMMUNITY): Payer: Self-pay

## 2014-12-19 ENCOUNTER — Encounter (HOSPITAL_COMMUNITY)
Admission: RE | Admit: 2014-12-19 | Discharge: 2014-12-19 | Disposition: A | Discharge status: Home / Self Care | Payer: Self-pay | Source: Ambulatory Visit | Attending: Cardiovascular Disease | Admitting: Cardiovascular Disease

## 2014-12-21 ENCOUNTER — Encounter (HOSPITAL_COMMUNITY): Payer: Self-pay

## 2014-12-23 ENCOUNTER — Encounter (HOSPITAL_COMMUNITY): Payer: Self-pay

## 2014-12-26 ENCOUNTER — Encounter (HOSPITAL_COMMUNITY): Payer: Self-pay

## 2014-12-28 ENCOUNTER — Encounter (HOSPITAL_COMMUNITY)
Admission: RE | Admit: 2014-12-28 | Discharge: 2014-12-28 | Disposition: A | Discharge status: Home / Self Care | Payer: Self-pay | Source: Ambulatory Visit | Attending: Cardiovascular Disease | Admitting: Cardiovascular Disease

## 2014-12-28 DIAGNOSIS — I252 Old myocardial infarction: Secondary | ICD-10-CM | POA: Insufficient documentation

## 2014-12-28 DIAGNOSIS — Z951 Presence of aortocoronary bypass graft: Secondary | ICD-10-CM | POA: Insufficient documentation

## 2014-12-30 ENCOUNTER — Encounter (HOSPITAL_COMMUNITY): Payer: Self-pay

## 2015-01-02 ENCOUNTER — Encounter (HOSPITAL_COMMUNITY)
Admission: RE | Admit: 2015-01-02 | Discharge: 2015-01-02 | Disposition: A | Discharge status: Home / Self Care | Payer: Self-pay | Source: Ambulatory Visit | Attending: Cardiovascular Disease | Admitting: Cardiovascular Disease

## 2015-01-04 ENCOUNTER — Other Ambulatory Visit: Payer: Self-pay | Admitting: Orthopedic Surgery

## 2015-01-04 ENCOUNTER — Encounter (HOSPITAL_COMMUNITY): Payer: Self-pay

## 2015-01-06 ENCOUNTER — Encounter (HOSPITAL_COMMUNITY)
Admission: RE | Admit: 2015-01-06 | Discharge: 2015-01-06 | Disposition: A | Discharge status: Home / Self Care | Payer: Self-pay | Source: Ambulatory Visit | Attending: Cardiovascular Disease | Admitting: Cardiovascular Disease

## 2015-01-09 ENCOUNTER — Encounter (HOSPITAL_COMMUNITY)
Admission: RE | Admit: 2015-01-09 | Discharge: 2015-01-09 | Disposition: A | Discharge status: Home / Self Care | Payer: Self-pay | Source: Ambulatory Visit | Attending: Cardiovascular Disease | Admitting: Cardiovascular Disease

## 2015-01-11 ENCOUNTER — Other Ambulatory Visit: Payer: Self-pay | Admitting: Orthopedic Surgery

## 2015-01-11 ENCOUNTER — Encounter (HOSPITAL_COMMUNITY): Payer: Self-pay

## 2015-01-11 NOTE — Pre-Procedure Instructions (Signed)
    THEORY IMIG  01/11/2015      [id number] - [place], [place] - [address]OSS [address] [place] [address] Phone: [phone] Fax: [phone]    Your procedure is scheduled on 01/26/15.  Report to [facility] Admitting at 8:15 A.M.  Call this number if you have problems the morning of surgery:  [phone]   Remember:  Do not eat food or drink liquids after midnight.  Take these medicines the morning of surgery with A SIP OF WATER --allopurinol,hydrocodone,metoprolol   Do not wear jewelry, make-up or nail polish.  Do not wear lotions, powders, or perfumes.  You may wear deodorant.  Do not shave 48 hours prior to surgery.  Men may shave face and neck.  Do not bring valuables to the hospital.  [facility] is not responsible for any belongings or valuables.  Contacts, dentures or bridgework may not be worn into surgery.  Leave your suitcase in the car.  After surgery it may be brought to your room.  For patients admitted to the hospital, discharge time will be determined by your treatment team.  Patients discharged the day of surgery will not be allowed to drive home.   Name and phone number of your driver:    Special instructions:    Please read over the following fact sheets that you were given. Pain Booklet, Coughing and Deep Breathing, MRSA Information and Surgical Site Infection Prevention

## 2015-01-12 ENCOUNTER — Encounter (HOSPITAL_COMMUNITY): Payer: Self-pay

## 2015-01-12 ENCOUNTER — Encounter (HOSPITAL_COMMUNITY)
Admission: RE | Admit: 2015-01-12 | Discharge: 2015-01-12 | Disposition: A | Discharge status: Home / Self Care | Payer: Medicare Other | Source: Ambulatory Visit | Attending: Orthopedic Surgery | Admitting: Orthopedic Surgery

## 2015-01-12 DIAGNOSIS — I712 Thoracic aortic aneurysm, without rupture: Secondary | ICD-10-CM | POA: Diagnosis not present

## 2015-01-12 DIAGNOSIS — Z0183 Encounter for blood typing: Secondary | ICD-10-CM | POA: Diagnosis not present

## 2015-01-12 DIAGNOSIS — I252 Old myocardial infarction: Secondary | ICD-10-CM | POA: Diagnosis not present

## 2015-01-12 DIAGNOSIS — Z01812 Encounter for preprocedural laboratory examination: Secondary | ICD-10-CM | POA: Insufficient documentation

## 2015-01-12 DIAGNOSIS — Z87891 Personal history of nicotine dependence: Secondary | ICD-10-CM | POA: Insufficient documentation

## 2015-01-12 DIAGNOSIS — M5410 Radiculopathy, site unspecified: Secondary | ICD-10-CM | POA: Diagnosis not present

## 2015-01-12 DIAGNOSIS — G8929 Other chronic pain: Secondary | ICD-10-CM | POA: Insufficient documentation

## 2015-01-12 DIAGNOSIS — Z951 Presence of aortocoronary bypass graft: Secondary | ICD-10-CM | POA: Diagnosis not present

## 2015-01-12 DIAGNOSIS — Z01818 Encounter for other preprocedural examination: Secondary | ICD-10-CM | POA: Diagnosis not present

## 2015-01-12 DIAGNOSIS — I2510 Atherosclerotic heart disease of native coronary artery without angina pectoris: Secondary | ICD-10-CM | POA: Insufficient documentation

## 2015-01-12 DIAGNOSIS — R918 Other nonspecific abnormal finding of lung field: Secondary | ICD-10-CM | POA: Diagnosis not present

## 2015-01-12 DIAGNOSIS — Z79899 Other long term (current) drug therapy: Secondary | ICD-10-CM | POA: Insufficient documentation

## 2015-01-12 DIAGNOSIS — Z7982 Long term (current) use of aspirin: Secondary | ICD-10-CM | POA: Diagnosis not present

## 2015-01-12 DIAGNOSIS — K219 Gastro-esophageal reflux disease without esophagitis: Secondary | ICD-10-CM | POA: Diagnosis not present

## 2015-01-12 DIAGNOSIS — I10 Essential (primary) hypertension: Secondary | ICD-10-CM | POA: Insufficient documentation

## 2015-01-12 DIAGNOSIS — M549 Dorsalgia, unspecified: Secondary | ICD-10-CM | POA: Insufficient documentation

## 2015-01-12 DIAGNOSIS — E785 Hyperlipidemia, unspecified: Secondary | ICD-10-CM | POA: Diagnosis not present

## 2015-01-12 LAB — COMPREHENSIVE METABOLIC PANEL
ALK PHOS: 56 U/L (ref 38–126)
ALT: 27 U/L (ref 17–63)
ANION GAP: 7 (ref 5–15)
AST: 29 U/L (ref 15–41)
Albumin: 4.2 g/dL (ref 3.5–5.0)
BILIRUBIN TOTAL: 0.9 mg/dL (ref 0.3–1.2)
BUN: 20 mg/dL (ref 6–20)
CALCIUM: 10.6 mg/dL — AB (ref 8.9–10.3)
CO2: 29 mmol/L (ref 22–32)
Chloride: 103 mmol/L (ref 101–111)
Creatinine, Ser: 1.03 mg/dL (ref 0.61–1.24)
Glucose, Bld: 110 mg/dL — ABNORMAL HIGH (ref 65–99)
POTASSIUM: 4.4 mmol/L (ref 3.5–5.1)
Sodium: 139 mmol/L (ref 135–145)
TOTAL PROTEIN: 6.8 g/dL (ref 6.5–8.1)

## 2015-01-12 LAB — URINALYSIS, ROUTINE W REFLEX MICROSCOPIC
BILIRUBIN URINE: NEGATIVE
Glucose, UA: NEGATIVE mg/dL
HGB URINE DIPSTICK: NEGATIVE
KETONES UR: NEGATIVE mg/dL
Leukocytes, UA: NEGATIVE
NITRITE: NEGATIVE
PH: 5 (ref 5.0–8.0)
Protein, ur: NEGATIVE mg/dL
Specific Gravity, Urine: 1.019 (ref 1.005–1.030)

## 2015-01-12 LAB — CBC WITH DIFFERENTIAL/PLATELET
BASOS PCT: 0 %
Basophils Absolute: 0 10*3/uL (ref 0.0–0.1)
Eosinophils Absolute: 0.2 10*3/uL (ref 0.0–0.7)
Eosinophils Relative: 3 %
HEMATOCRIT: 47.3 % (ref 39.0–52.0)
Hemoglobin: 15.9 g/dL (ref 13.0–17.0)
LYMPHS ABS: 2.1 10*3/uL (ref 0.7–4.0)
LYMPHS PCT: 28 %
MCH: 32.8 pg (ref 26.0–34.0)
MCHC: 33.6 g/dL (ref 30.0–36.0)
MCV: 97.5 fL (ref 78.0–100.0)
MONO ABS: 0.6 10*3/uL (ref 0.1–1.0)
MONOS PCT: 8 %
NEUTROS ABS: 4.6 10*3/uL (ref 1.7–7.7)
Neutrophils Relative %: 61 %
Platelets: 159 10*3/uL (ref 150–400)
RBC: 4.85 MIL/uL (ref 4.22–5.81)
RDW: 13.2 % (ref 11.5–15.5)
WBC: 7.6 10*3/uL (ref 4.0–10.5)

## 2015-01-12 LAB — SURGICAL PCR SCREEN
MRSA, PCR: NEGATIVE
STAPHYLOCOCCUS AUREUS: NEGATIVE

## 2015-01-12 LAB — PROTIME-INR
INR: 1.13 (ref 0.00–1.49)
PROTHROMBIN TIME: 14.7 s (ref 11.6–15.2)

## 2015-01-12 LAB — APTT: aPTT: 31 seconds (ref 24–37)

## 2015-01-13 ENCOUNTER — Encounter (HOSPITAL_COMMUNITY)
Admission: RE | Admit: 2015-01-13 | Discharge: 2015-01-13 | Disposition: A | Discharge status: Home / Self Care | Payer: Self-pay | Source: Ambulatory Visit | Attending: Cardiovascular Disease | Admitting: Cardiovascular Disease

## 2015-01-13 NOTE — Progress Notes (Signed)
Anesthesia Chart Review: Patient is a 69 year old male scheduled for L5-S1 decompression on 01/26/15 by Dr. Lynann Bologna.  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   Meds include allopurinol, ASA 81 mg, Dexilant, lisinopril, Lopressor, Nitro, Zocor.   PCP is Dr. Jani Gravel.  Cardiologist is Dr. Angelena Form, last visit 08/19/14. One year follow-up recommended. Plan to repeat carotid duplex 03/2015. He has been exercising in cardiac rehab.  01/12/15 EKG: NSR, cannot rule out anterior infarct (age undetermined).  06/04/12 Nuclear stress test: Overall Impression: Normal stress nuclear study.  LV Ejection Fraction: 70%. LV Wall Motion: NL LV Function; NL Wall Motion  03/18/12-04/16/12 Event monitor: NSR, PVCs. No afib or VT.  02/05/10 Echo: Study Conclusions - Left ventricle: The cavity size was normal. Wall thickness was  normal. Systolic function was normal. The estimated ejection  fraction was in the range of 55% to 60%. Possible hypokinesis of  the apical myocardium. - Left atrium: The atrium was mildly dilated. - Right atrium: The atrium was mildly dilated. - Atrial septum: There was an atrial septal aneurysm. Impressions: - Technically difficult study; EF difficult to quantitate.  Last cardiac cath was pre-CABG (01/01/10).  04/06/14 Carotid duplex: Stable 40-59% RICA stenosis. Stable 123456 LICA stenosis. Patent vertebral arteries with antegrade flow. Normal subclavian arteries, bilaterally.   01/12/15 CXR: IMPRESSION: The interstitial markings within the left lung are increased and may be related to the patient's reactive airway disease and remote smoking history. There is no alveolar pneumonia.  Correlation with the presence or absence of any clinical signs of bronchitis is needed. There is no definite pulmonary edema. There are chronic bilateral rib fractures exhibiting residual deformity.  Preoperative labs noted.   Patient with cardiology follow-up within the past six months. Participating in cardiac rehab. Tolerated surgery two months ago. Carotid and TAA both evaluated within the past year. If no acute changes or new cardiopulmonary symptoms then I would anticipate that he could proceed as planned. Anesthesiologist Dr. Deatra Canter agrees with this plan.  [facility] Stay [facility]/Anesthesiology Phone [phone] 01/13/2015 1:51 PM

## 2015-01-16 ENCOUNTER — Encounter (HOSPITAL_COMMUNITY): Payer: Self-pay

## 2015-01-16 ENCOUNTER — Encounter: Payer: Self-pay | Admitting: Cardiovascular Disease

## 2015-01-16 ENCOUNTER — Telehealth: Payer: Self-pay | Admitting: *Deleted

## 2015-01-16 NOTE — Telephone Encounter (Signed)
Received request for surgical clearance from [place].  I spoke with Samuel Coleman and he reports from a heart standpoint he is doing well.  No chest pain or shortness of breath.  I told Samuel Coleman Dr. Angelena Form would write letter clearing him for surgery.  Letter given to medical records to fax.

## 2015-01-18 ENCOUNTER — Encounter (HOSPITAL_COMMUNITY): Payer: Self-pay

## 2015-01-23 ENCOUNTER — Encounter (HOSPITAL_COMMUNITY): Payer: Self-pay

## 2015-01-23 NOTE — H&P (Signed)
PREOPERATIVE H&P  Chief Complaint: L leg pain  HPI: Samuel Coleman is a 69 y.o. male who presents with ongoing pain in the L buttock and L leg x 11 months  MRI reveals NF stenosis on the left at L5/S1 from a disc protrusion and L5/S1 facet hypertrophy  Patient has failed multiple forms of conservative care and continues to have pain (see office notes for additional details regarding the patient's full course of treatment)  Past Medical History  Diagnosis Date  . Thoracic aortic aneurysm ([place])      stable on CT 01/13/10;  Chest/abdominal CTA (11/2011):  3.9 x 3.6 cm prox descending thoracic aorta (stable)  . Hyperlipidemia   . Gout   . GERD (gastroesophageal reflux disease)   . Hypertension   . Ischemic colitis ([place])     noted on CT back in August 2013 possible related to hypotension  . ST elevation myocardial infarction (STEMI) of inferior wall ([place]) 2011    s/p CABG 2011  . Asthma     exercise induced  . PUD (peptic ulcer disease) 1974  . History of kidney stones     no problems with stones  . Anxiety   . Back pain, chronic     gets injections in back  . CAD (coronary artery disease)     a.  prior inferior MI; s/p 3V CABG 01/01/10 (LIMA to LAD, SVG to OM, SVG to RCA- Dr.Bartle);  b. Last Myoview (05/2012):  Normal; EF 70%  . Hx of echocardiogram     Echo (01/2010):  EF 55-60%, ? Apical HK, mild BAE, atrial septal aneurysm.  . PVC's (premature ventricular contractions)     Event monitor in 04/2012: NSR, PVCs.  . Carotid stenosis     a. Carotid US (01/2011):  Bilateral ICA 0-39% (repeat in 1 year).;  b.  Carotid US (123XX123): RICA 123456; LICA 123456. Follow up 1 year;  c.  Carotid US (2/16):  R 40-59%; L 1-39%; FU 1 year.  Marland Kitchen Dysrhythmia     PVC's  . Pneumonia     x 2  . Arthritis    Past Surgical History  Procedure Laterality Date  . Tonsillectomy    . Excision of deep left neck mass. surgeon:  christopher e. newman, [place]  . Cardiac catheterization      . Chest tube insertion  1992    fx lt ribs-collapsed lung  . Eye surgery  both cararacts  . Ear cyst excision Left 09/02/2012    Procedure: EXCISION SEBACEOUS CYST LEFT POSTERIOR LEG;  Surgeon: Pedro Earls, MD;  Location: [place];  Service: General;  Laterality: Left;  . Coronary artery bypass graft  11/11    X 3 VESSELS  . Hernia repair    . Inguinal hernia repair Right 12/02/2012    Procedure: OPEN RIGHT INGUINAL HERINA;  Surgeon: Pedro Earls, MD;  Location: [facility];  Service: General;  Laterality: Right;  . Insertion of mesh Right 12/02/2012    Procedure: INSERTION OF MESH;  Surgeon: Pedro Earls, MD;  Location: [facility];  Service: General;  Laterality: Right;  . Cyst removel on right buttocks - 2014    . Cyst removed from back of neck    . Inguinal hernia repair Left 11/02/2014    Procedure: OPEN LEFT INGUINAL HERNIA REPAIR WITH MESH;  Surgeon: Johnathan Hausen, MD;  Location: [facility];  Service: General;  Laterality: Left;   Social History  Social History  . Marital Status: Widowed    Spouse Name: N/A  . Number of Children: N/A  . Years of Education: N/A   Social History Main Topics  . Smoking status: Former Smoker    Quit date: 02/25/1974  . Smokeless tobacco: Never Used  . Alcohol Use: Yes     Comment: rarely  . Drug Use: No  . Sexual Activity: Not on file   Other Topics Concern  . Not on file   Social History Narrative   Family History  Problem Relation Age of Onset  . Heart failure Mother   . Pneumonia Father   . Cancer Sister     lung  . Cancer Maternal Grandmother     pancreatic  . Cancer Sister     esophagus   Allergies  Allergen Reactions  . Levaquin [Levofloxacin]     INSOMNIA   Prior to Admission medications   Medication Sig Start Date End Date Taking? Authorizing Provider  allopurinol (ZYLOPRIM) 300 MG tablet Take 300 mg by mouth every morning.    Yes Historical Provider, MD  aspirin EC 81 MG tablet Take 81 mg by mouth  every morning.    Yes Historical Provider, MD  Cholecalciferol (VITAMIN D3) 3000 UNITS TABS Take 300 mg by mouth daily.   Yes Historical Provider, MD  clotrimazole (LOTRIMIN) 1 % cream Apply 1 application topically 2 (two) times daily.   Yes Historical Provider, MD  dexlansoprazole (DEXILANT) 60 MG capsule Take 60 mg by mouth every morning.    Yes Historical Provider, MD  HYDROcodone-acetaminophen (NORCO) 10-325 MG per tablet Take 1 tablet by mouth every 4 (four) hours as needed for severe pain. 11/03/14  Yes Johnathan Hausen, MD  hydrocortisone cream 1 % Apply 1 application topically 2 (two) times daily.   Yes Historical Provider, MD  lisinopril (PRINIVIL,ZESTRIL) 20 MG tablet TAKE 1 TABLET (20 MG TOTAL) BY MOUTH 2 (TWO) TIMES DAILY. Patient taking differently: patient taking 2 tabs (40mg ) in the morning and 1 tab (20mg ) in the evening 02/28/14  Yes [name], MD  metoprolol (LOPRESSOR) 50 MG tablet Take 1 tablet (50 mg total) by mouth 2 (two) times daily. 02/03/14  Yes [name], MD  Multiple Vitamin (MULTIVITAMIN WITH MINERALS) TABS tablet Take 1 tablet by mouth every morning.    Yes Historical Provider, MD  neomycin-bacitracin-polymyxin (NEOSPORIN) [phone] ointment Apply 1 application topically 2 (two) times daily.   Yes Historical Provider, MD  nitroGLYCERIN (NITROSTAT) 0.4 MG SL tablet Place 1 tablet (0.4 mg total) under the tongue every 5 (five) minutes as needed for chest pain. 02/03/14  Yes [name], MD  simvastatin (ZOCOR) 40 MG tablet Take 40 mg by mouth every morning.    Yes Historical Provider, MD  vitamin B-12 (CYANOCOBALAMIN) 500 MCG tablet Take 500 mcg by mouth daily.   Yes Historical Provider, MD  vitamin C (ASCORBIC ACID) 500 MG tablet Take 1,000 mg by mouth every morning.    Yes Historical Provider, MD  HYDROcodone-acetaminophen (NORCO) 10-325 MG per tablet TAKE 1 TABLET BY MOUTH THREE TIMES A DAY AS NEEDED FOR PAIN 06/13/14   Historical Provider,  MD  lisinopril (PRINIVIL,ZESTRIL) 20 MG tablet TAKE 1 TABLET BY MOUTH TWICE A DAY Patient not taking: Reported on 10/17/2014 08/30/14   [name], MD  metoprolol (LOPRESSOR) 50 MG tablet TAKE 1 TABLET (50 MG TOTAL) BY MOUTH 2 (TWO) TIMES DAILY. Patient not taking: Reported on 01/11/2015 10/26/14   [name],  MD     All other systems have been reviewed and were otherwise negative with the exception of those mentioned in the HPI and as above.  Physical Exam: There were no vitals filed for this visit.  General: Alert, no acute distress Cardiovascular: No pedal edema Respiratory: No cyanosis, no use of accessory musculature Skin: No lesions in the area of chief complaint Neurologic: Sensation intact distally Psychiatric: Patient is competent for consent with normal mood and affect Lymphatic: No axillary or cervical lymphadenopathy  MUSCULOSKELETAL: NVI  Assessment/Plan:  Left lumbar radiculopathy (L5)  Plan for Procedure(s): LUMBAR LAMINECTOMY/DECOMPRESSION L5/S1   Sinclair Ship, MD 01/23/2015 10:18 AM

## 2015-01-25 ENCOUNTER — Encounter (HOSPITAL_COMMUNITY): Payer: Self-pay

## 2015-01-25 MED ORDER — CEFAZOLIN SODIUM-DEXTROSE 2-3 GM-% IV SOLR
2.0000 g | INTRAVENOUS | Status: AC
  Administered 2015-01-26: 2 g via INTRAVENOUS
  Filled 2015-01-25: qty 50

## 2015-01-25 MED ORDER — POVIDONE-IODINE 7.5 % EX SOLN
Freq: Once | CUTANEOUS | Status: DC
  Filled 2015-01-25: qty 118

## 2015-01-26 ENCOUNTER — Ambulatory Visit (HOSPITAL_COMMUNITY)
Admission: RE | Admit: 2015-01-26 | Discharge: 2015-01-26 | Disposition: A | Discharge status: Home / Self Care | Payer: Medicare Other | Source: Ambulatory Visit | Attending: Orthopedic Surgery | Admitting: Orthopedic Surgery

## 2015-01-26 ENCOUNTER — Encounter (HOSPITAL_COMMUNITY)
Admission: RE | Disposition: A | Discharge status: Home / Self Care | Payer: Self-pay | Source: Ambulatory Visit | Attending: Orthopedic Surgery

## 2015-01-26 ENCOUNTER — Ambulatory Visit (HOSPITAL_COMMUNITY): Payer: Medicare Other | Admitting: Anesthesiology

## 2015-01-26 ENCOUNTER — Encounter (HOSPITAL_COMMUNITY): Payer: Self-pay | Admitting: *Deleted

## 2015-01-26 ENCOUNTER — Ambulatory Visit (HOSPITAL_COMMUNITY): Payer: Medicare Other | Admitting: Vascular Surgery

## 2015-01-26 ENCOUNTER — Ambulatory Visit (HOSPITAL_COMMUNITY): Payer: Medicare Other

## 2015-01-26 DIAGNOSIS — M4807 Spinal stenosis, lumbosacral region: Secondary | ICD-10-CM | POA: Diagnosis present

## 2015-01-26 DIAGNOSIS — Z419 Encounter for procedure for purposes other than remedying health state, unspecified: Secondary | ICD-10-CM

## 2015-01-26 DIAGNOSIS — I2510 Atherosclerotic heart disease of native coronary artery without angina pectoris: Secondary | ICD-10-CM | POA: Diagnosis not present

## 2015-01-26 DIAGNOSIS — E785 Hyperlipidemia, unspecified: Secondary | ICD-10-CM | POA: Insufficient documentation

## 2015-01-26 DIAGNOSIS — M5416 Radiculopathy, lumbar region: Secondary | ICD-10-CM | POA: Insufficient documentation

## 2015-01-26 DIAGNOSIS — Z7982 Long term (current) use of aspirin: Secondary | ICD-10-CM | POA: Insufficient documentation

## 2015-01-26 DIAGNOSIS — I10 Essential (primary) hypertension: Secondary | ICD-10-CM | POA: Diagnosis not present

## 2015-01-26 DIAGNOSIS — Z951 Presence of aortocoronary bypass graft: Secondary | ICD-10-CM | POA: Insufficient documentation

## 2015-01-26 DIAGNOSIS — I252 Old myocardial infarction: Secondary | ICD-10-CM | POA: Insufficient documentation

## 2015-01-26 DIAGNOSIS — Z87891 Personal history of nicotine dependence: Secondary | ICD-10-CM | POA: Diagnosis not present

## 2015-01-26 HISTORY — PX: LUMBAR LAMINECTOMY/DECOMPRESSION MICRODISCECTOMY: SHX5026

## 2015-01-26 SURGERY — LUMBAR LAMINECTOMY/DECOMPRESSION MICRODISCECTOMY
Anesthesia: General | Site: Back

## 2015-01-26 MED ORDER — HYDROMORPHONE HCL 1 MG/ML IJ SOLN
0.2500 mg | INTRAMUSCULAR | Status: DC | PRN
Start: 2015-01-26 — End: 2015-01-26

## 2015-01-26 MED ORDER — GLYCOPYRROLATE 0.2 MG/ML IJ SOLN
INTRAMUSCULAR | Status: AC
  Filled 2015-01-26: qty 2

## 2015-01-26 MED ORDER — DEXAMETHASONE SODIUM PHOSPHATE 10 MG/ML IJ SOLN
INTRAMUSCULAR | Status: DC | PRN
  Administered 2015-01-26: 10 mg via INTRAVENOUS

## 2015-01-26 MED ORDER — LACTATED RINGERS IV SOLN
INTRAVENOUS | Status: DC
  Administered 2015-01-26 (×3): via INTRAVENOUS

## 2015-01-26 MED ORDER — NEOSTIGMINE METHYLSULFATE 10 MG/10ML IV SOLN
INTRAVENOUS | Status: DC | PRN
  Administered 2015-01-26: 3 mg via INTRAVENOUS

## 2015-01-26 MED ORDER — PHENYLEPHRINE HCL 10 MG/ML IJ SOLN
10.0000 mg | INTRAVENOUS | Status: DC | PRN
  Administered 2015-01-26: 25 ug/min via INTRAVENOUS

## 2015-01-26 MED ORDER — GLYCOPYRROLATE 0.2 MG/ML IJ SOLN
INTRAMUSCULAR | Status: DC | PRN
  Administered 2015-01-26: 0.4 mg via INTRAVENOUS

## 2015-01-26 MED ORDER — LIDOCAINE HCL (CARDIAC) 20 MG/ML IV SOLN
INTRAVENOUS | Status: DC | PRN
  Administered 2015-01-26: 50 mg via INTRAVENOUS

## 2015-01-26 MED ORDER — METHYLENE BLUE 1 % INJ SOLN
INTRAMUSCULAR | Status: AC
  Filled 2015-01-26: qty 10

## 2015-01-26 MED ORDER — FENTANYL CITRATE (PF) 250 MCG/5ML IJ SOLN
INTRAMUSCULAR | Status: AC
  Filled 2015-01-26: qty 5

## 2015-01-26 MED ORDER — MIDAZOLAM HCL 2 MG/2ML IJ SOLN
INTRAMUSCULAR | Status: AC
  Filled 2015-01-26: qty 2

## 2015-01-26 MED ORDER — BUPIVACAINE-EPINEPHRINE 0.25% -1:200000 IJ SOLN
INTRAMUSCULAR | Status: DC | PRN
  Administered 2015-01-26: 15 mL

## 2015-01-26 MED ORDER — METHYLENE BLUE 1 % INJ SOLN
INTRAMUSCULAR | Status: DC | PRN
  Administered 2015-01-26: 1 mL

## 2015-01-26 MED ORDER — ONDANSETRON HCL 4 MG/2ML IJ SOLN
INTRAMUSCULAR | Status: AC
  Filled 2015-01-26: qty 2

## 2015-01-26 MED ORDER — PROPOFOL 10 MG/ML IV BOLUS
INTRAVENOUS | Status: AC
  Filled 2015-01-26: qty 20

## 2015-01-26 MED ORDER — OXYCODONE-ACETAMINOPHEN 5-325 MG PO TABS
ORAL_TABLET | ORAL | Status: AC
  Filled 2015-01-26: qty 1

## 2015-01-26 MED ORDER — ROCURONIUM BROMIDE 50 MG/5ML IV SOLN
INTRAVENOUS | Status: AC
  Filled 2015-01-26: qty 1

## 2015-01-26 MED ORDER — LIDOCAINE HCL (CARDIAC) 20 MG/ML IV SOLN
INTRAVENOUS | Status: AC
  Filled 2015-01-26: qty 5

## 2015-01-26 MED ORDER — THROMBIN 20000 UNITS EX SOLR
CUTANEOUS | Status: DC | PRN
  Administered 2015-01-26: 20 mL via TOPICAL

## 2015-01-26 MED ORDER — THROMBIN 20000 UNITS EX SOLR
CUTANEOUS | Status: AC
  Filled 2015-01-26: qty 20000

## 2015-01-26 MED ORDER — HEMOSTATIC AGENTS (NO CHARGE) OPTIME
TOPICAL | Status: DC | PRN
Start: 2015-01-26 — End: 2015-01-26
  Administered 2015-01-26: 1 via TOPICAL

## 2015-01-26 MED ORDER — 0.9 % SODIUM CHLORIDE (POUR BTL) OPTIME
TOPICAL | Status: DC | PRN
  Administered 2015-01-26: 1000 mL

## 2015-01-26 MED ORDER — EPHEDRINE SULFATE 50 MG/ML IJ SOLN
INTRAMUSCULAR | Status: AC
  Filled 2015-01-26: qty 1

## 2015-01-26 MED ORDER — PROPOFOL 10 MG/ML IV BOLUS
INTRAVENOUS | Status: DC | PRN
  Administered 2015-01-26: 100 mg via INTRAVENOUS

## 2015-01-26 MED ORDER — METHYLPREDNISOLONE ACETATE 40 MG/ML IJ SUSP
INTRAMUSCULAR | Status: AC
  Filled 2015-01-26: qty 1

## 2015-01-26 MED ORDER — MIDAZOLAM HCL 5 MG/5ML IJ SOLN
INTRAMUSCULAR | Status: DC | PRN
  Administered 2015-01-26: 2 mg via INTRAVENOUS

## 2015-01-26 MED ORDER — DEXAMETHASONE SODIUM PHOSPHATE 10 MG/ML IJ SOLN
INTRAMUSCULAR | Status: AC
  Filled 2015-01-26: qty 1

## 2015-01-26 MED ORDER — ONDANSETRON HCL 4 MG/2ML IJ SOLN
INTRAMUSCULAR | Status: DC | PRN
  Administered 2015-01-26: 4 mg via INTRAVENOUS

## 2015-01-26 MED ORDER — PHENYLEPHRINE 40 MCG/ML (10ML) SYRINGE FOR IV PUSH (FOR BLOOD PRESSURE SUPPORT)
PREFILLED_SYRINGE | INTRAVENOUS | Status: AC
  Filled 2015-01-26: qty 10

## 2015-01-26 MED ORDER — PROMETHAZINE HCL 25 MG/ML IJ SOLN: 6.2500 mg | INTRAMUSCULAR | Status: DC | PRN

## 2015-01-26 MED ORDER — BUPIVACAINE-EPINEPHRINE (PF) 0.25% -1:200000 IJ SOLN
INTRAMUSCULAR | Status: AC
  Filled 2015-01-26: qty 30

## 2015-01-26 MED ORDER — OXYCODONE-ACETAMINOPHEN 5-325 MG PO TABS
1.0000 | ORAL_TABLET | Freq: Once | ORAL | Status: AC
  Administered 2015-01-26: 1 via ORAL

## 2015-01-26 MED ORDER — NEOSTIGMINE METHYLSULFATE 10 MG/10ML IV SOLN
INTRAVENOUS | Status: AC
  Filled 2015-01-26: qty 1

## 2015-01-26 MED ORDER — FENTANYL CITRATE (PF) 100 MCG/2ML IJ SOLN
INTRAMUSCULAR | Status: DC | PRN
  Administered 2015-01-26: 50 ug via INTRAVENOUS
  Administered 2015-01-26: 100 ug via INTRAVENOUS
  Administered 2015-01-26: 50 ug via INTRAVENOUS
  Administered 2015-01-26: 100 ug via INTRAVENOUS
  Administered 2015-01-26: 50 ug via INTRAVENOUS

## 2015-01-26 MED ORDER — PHENYLEPHRINE HCL 10 MG/ML IJ SOLN
INTRAMUSCULAR | Status: DC | PRN
  Administered 2015-01-26: 80 ug via INTRAVENOUS

## 2015-01-26 MED ORDER — ROCURONIUM BROMIDE 100 MG/10ML IV SOLN
INTRAVENOUS | Status: DC | PRN
  Administered 2015-01-26: 50 mg via INTRAVENOUS

## 2015-01-26 SURGICAL SUPPLY — 71 items
BENZOIN TINCTURE PRP APPL 2/3 (GAUZE/BANDAGES/DRESSINGS) ×3 IMPLANT
BUR SABER RD CUTTING 3.0 (BURR) ×2 IMPLANT
BUR SABER RD CUTTING 3.0MM (BURR) ×1
CANISTER SUCTION 2500CC (MISCELLANEOUS) ×3 IMPLANT
CARTRIDGE OIL MAESTRO DRILL (MISCELLANEOUS) ×1 IMPLANT
CLOSURE WOUND 1/2 X4 (GAUZE/BANDAGES/DRESSINGS) ×1
CORDS BIPOLAR (ELECTRODE) ×3 IMPLANT
COVER SURGICAL LIGHT HANDLE (MISCELLANEOUS) ×3 IMPLANT
DIFFUSER DRILL AIR PNEUMATIC (MISCELLANEOUS) ×3 IMPLANT
DRAIN CHANNEL 15F RND FF W/TCR (WOUND CARE) IMPLANT
DRAPE POUCH INSTRU U-SHP 10X18 (DRAPES) ×6 IMPLANT
DRAPE SURG 17X23 STRL (DRAPES) ×12 IMPLANT
DURAPREP 26ML APPLICATOR (WOUND CARE) ×3 IMPLANT
ELECT BLADE 4.0 EZ CLEAN MEGAD (MISCELLANEOUS) ×3
ELECT CAUTERY BLADE 6.4 (BLADE) ×3 IMPLANT
ELECT REM PT RETURN 9FT ADLT (ELECTROSURGICAL) ×3
ELECTRODE BLDE 4.0 EZ CLN MEGD (MISCELLANEOUS) ×1 IMPLANT
ELECTRODE REM PT RTRN 9FT ADLT (ELECTROSURGICAL) ×1 IMPLANT
EVACUATOR SILICONE 100CC (DRAIN) IMPLANT
FILTER STRAW FLUID ASPIR (MISCELLANEOUS) ×3 IMPLANT
GAUZE SPONGE 4X4 12PLY STRL (GAUZE/BANDAGES/DRESSINGS) ×3 IMPLANT
GAUZE SPONGE 4X4 16PLY XRAY LF (GAUZE/BANDAGES/DRESSINGS) ×6 IMPLANT
GLOVE BIO SURGEON STRL SZ7 (GLOVE) ×3 IMPLANT
GLOVE BIO SURGEON STRL SZ8 (GLOVE) ×3 IMPLANT
GLOVE BIOGEL PI IND STRL 7.0 (GLOVE) ×1 IMPLANT
GLOVE BIOGEL PI IND STRL 8 (GLOVE) ×1 IMPLANT
GLOVE BIOGEL PI INDICATOR 7.0 (GLOVE) ×2
GLOVE BIOGEL PI INDICATOR 8 (GLOVE) ×2
GOWN STRL REUS W/ TWL LRG LVL3 (GOWN DISPOSABLE) ×1 IMPLANT
GOWN STRL REUS W/ TWL XL LVL3 (GOWN DISPOSABLE) ×2 IMPLANT
GOWN STRL REUS W/TWL LRG LVL3 (GOWN DISPOSABLE) ×2
GOWN STRL REUS W/TWL XL LVL3 (GOWN DISPOSABLE) ×4
IV CATH 14GX2 1/4 (CATHETERS) ×3 IMPLANT
KIT BASIN OR (CUSTOM PROCEDURE TRAY) ×3 IMPLANT
KIT POSITION SURG JACKSON T1 (MISCELLANEOUS) ×3 IMPLANT
KIT ROOM TURNOVER OR (KITS) ×3 IMPLANT
NEEDLE 18GX1X1/2 (RX/OR ONLY) (NEEDLE) ×3 IMPLANT
NEEDLE 22X1 1/2 (OR ONLY) (NEEDLE) ×3 IMPLANT
NEEDLE HYPO 25GX1X1/2 BEV (NEEDLE) ×3 IMPLANT
NEEDLE SPNL 18GX3.5 QUINCKE PK (NEEDLE) ×6 IMPLANT
NS IRRIG 1000ML POUR BTL (IV SOLUTION) ×3 IMPLANT
OIL CARTRIDGE MAESTRO DRILL (MISCELLANEOUS) ×3
PACK LAMINECTOMY ORTHO (CUSTOM PROCEDURE TRAY) ×3 IMPLANT
PACK UNIVERSAL I (CUSTOM PROCEDURE TRAY) ×3 IMPLANT
PAD ARMBOARD 7.5X6 YLW CONV (MISCELLANEOUS) ×6 IMPLANT
PATTIES SURGICAL .5 X.5 (GAUZE/BANDAGES/DRESSINGS) IMPLANT
PATTIES SURGICAL .5 X1 (DISPOSABLE) ×3 IMPLANT
SPONGE INTESTINAL PEANUT (DISPOSABLE) ×3 IMPLANT
SPONGE SURGIFOAM ABS GEL 100 (HEMOSTASIS) ×3 IMPLANT
SPONGE SURGIFOAM ABS GEL SZ50 (HEMOSTASIS) ×3 IMPLANT
STRIP CLOSURE SKIN 1/2X4 (GAUZE/BANDAGES/DRESSINGS) ×2 IMPLANT
SURGIFLO W/THROMBIN 8M KIT (HEMOSTASIS) ×3 IMPLANT
SUT BONE WAX W31G (SUTURE) ×6 IMPLANT
SUT MNCRL AB 4-0 PS2 18 (SUTURE) ×3 IMPLANT
SUT NURALON 4 0 TR CR/8 (SUTURE) ×3 IMPLANT
SUT VIC AB 0 CT1 18XCR BRD 8 (SUTURE) IMPLANT
SUT VIC AB 0 CT1 27 (SUTURE) ×2
SUT VIC AB 0 CT1 27XBRD ANBCTR (SUTURE) ×1 IMPLANT
SUT VIC AB 0 CT1 8-18 (SUTURE)
SUT VIC AB 1 CT1 18XCR BRD 8 (SUTURE) ×1 IMPLANT
SUT VIC AB 1 CT1 8-18 (SUTURE) ×2
SUT VIC AB 2-0 CT2 18 VCP726D (SUTURE) ×3 IMPLANT
SYR 20CC LL (SYRINGE) ×3 IMPLANT
SYR BULB IRRIGATION 50ML (SYRINGE) ×3 IMPLANT
SYR CONTROL 10ML LL (SYRINGE) ×6 IMPLANT
SYR TB 1ML 26GX3/8 SAFETY (SYRINGE) ×6 IMPLANT
SYR TB 1ML LUER SLIP (SYRINGE) ×6 IMPLANT
TOWEL OR 17X24 6PK STRL BLUE (TOWEL DISPOSABLE) ×3 IMPLANT
TOWEL OR 17X26 10 PK STRL BLUE (TOWEL DISPOSABLE) ×3 IMPLANT
WATER STERILE IRR 1000ML POUR (IV SOLUTION) ×3 IMPLANT
YANKAUER SUCT BULB TIP NO VENT (SUCTIONS) ×3 IMPLANT

## 2015-01-26 NOTE — Anesthesia Preprocedure Evaluation (Signed)
Anesthesia Evaluation  Patient identified by MRN, date of birth, ID band Patient awake    Reviewed: Allergy & Precautions, NPO status , Patient's Chart, lab work & pertinent test results  Airway Mallampati: II  TM Distance: >3 FB Neck ROM: Full    Dental no notable dental hx.    Pulmonary neg pulmonary ROS, former smoker,    Pulmonary exam normal breath sounds clear to auscultation       Cardiovascular hypertension, Pt. on home beta blockers + CAD, + Past MI, + CABG and + Peripheral Vascular Disease  Normal cardiovascular exam Rhythm:Regular Rate:Normal  Myoview (05/2012): Normal; EF 70%   Neuro/Psych negative neurological ROS  negative psych ROS   GI/Hepatic Neg liver ROS, PUD,   Endo/Other  negative endocrine ROS  Renal/GU negative Renal ROS  negative genitourinary   Musculoskeletal negative musculoskeletal ROS (+)   Abdominal   Peds negative pediatric ROS (+)  Hematology negative hematology ROS (+)   Anesthesia Other Findings   Reproductive/Obstetrics negative OB ROS                             Anesthesia Physical Anesthesia Plan  ASA: III  Anesthesia Plan: General   Post-op Pain Management:    Induction: Intravenous  Airway Management Planned: Oral ETT  Additional Equipment:   Intra-op Plan:   Post-operative Plan: Extubation in OR  Informed Consent: I have reviewed the patients History and Physical, chart, labs and discussed the procedure including the risks, benefits and alternatives for the proposed anesthesia with the patient or authorized representative who has indicated his/her understanding and acceptance.   Dental advisory given  Plan Discussed with: CRNA and Surgeon  Anesthesia Plan Comments:         Anesthesia Quick Evaluation

## 2015-01-26 NOTE — Anesthesia Postprocedure Evaluation (Signed)
Anesthesia Post Note  Patient: Samuel Coleman  Procedure(s) Performed: Procedure(s) (LRB): Lumbar 5-sacrum 1 decompression (N/A)  Patient location during evaluation: PACU Anesthesia Type: General Level of consciousness: awake and alert Pain management: pain level controlled Vital Signs Assessment: post-procedure vital signs reviewed and stable Respiratory status: spontaneous breathing, nonlabored ventilation and respiratory function stable Cardiovascular status: blood pressure returned to baseline and stable Postop Assessment: no signs of nausea or vomiting Anesthetic complications: no    Last Vitals:  Filed Vitals:   01/26/15 1420 01/26/15 1430  BP: 140/77 146/81  Pulse: 71   Temp: 36.5 C 36.5 C  Resp: 8 12    Last Pain:  Filed Vitals:   01/26/15 1436  PainSc: 6     LLE Motor Response: Purposeful movement, Responds to commands LLE Sensation: No numbness, No pain, No tingling RLE Motor Response: Purposeful movement, Responds to commands RLE Sensation: No numbness, No pain, No tingling      Carlosdaniel Grob A

## 2015-01-26 NOTE — Transfer of Care (Signed)
Immediate Anesthesia Transfer of Care Note  Patient: Samuel Coleman  Procedure(s) Performed: Procedure(s): Lumbar 5-sacrum 1 decompression (N/A)  Patient Location: PACU  Anesthesia Type:General  Level of Consciousness: awake, alert , oriented and patient cooperative  Airway & Oxygen Therapy: Patient Spontanous Breathing and Patient connected to nasal cannula oxygen  Post-op Assessment: Report given to RN, Post -op Vital signs reviewed and stable and Patient moving all extremities  Post vital signs: Reviewed and stable  Last Vitals: There were no vitals filed for this visit.  Complications: No apparent anesthesia complications

## 2015-01-26 NOTE — Anesthesia Procedure Notes (Signed)
Procedure Name: Intubation Date/Time: 01/26/2015 10:44 AM Performed by: Rebekah Chesterfield L Pre-anesthesia Checklist: Patient identified, Emergency Drugs available, Suction available, Patient being monitored and Timeout performed Patient Re-evaluated:Patient Re-evaluated prior to inductionOxygen Delivery Method: Circle system utilized Preoxygenation: Pre-oxygenation with 100% oxygen Intubation Type: IV induction Ventilation: Mask ventilation with difficulty, Mask ventilation without difficulty and Oral airway inserted - appropriate to patient size Laryngoscope Size: Mac and 4 Grade View: Grade I Tube type: Oral Tube size: 8.0 mm Number of attempts: 1 Airway Equipment and Method: Stylet Placement Confirmation: ETT inserted through vocal cords under direct vision,  positive ETCO2 and breath sounds checked- equal and bilateral Secured at: 22 cm Tube secured with: Tape Dental Injury: Teeth and Oropharynx as per pre-operative assessment

## 2015-01-27 ENCOUNTER — Encounter (HOSPITAL_COMMUNITY): Payer: Self-pay

## 2015-01-27 ENCOUNTER — Encounter (HOSPITAL_COMMUNITY): Payer: Self-pay | Admitting: Orthopedic Surgery

## 2015-01-27 NOTE — Op Note (Signed)
NAMEBINNIE, LIVING NO.:  [id number]  MEDICAL RECORD NO.:  [id number]  LOCATION:  [facility]                         FACILITY:  [facility]  PHYSICIAN:  Phylliss Bob, MD      DATE OF BIRTH:  [birth year]  DATE OF PROCEDURE:  01/26/2015 DATE OF DISCHARGE:  01/26/2015                              OPERATIVE REPORT   PREOPERATIVE DIAGNOSES: 1. Left-sided L5 radiculopathy. 2. Left-sided L5-S1 neural foraminal stenosis.  POSTOPERATIVE DIAGNOSES: 1. Left-sided L5 radiculopathy. 2. Left-sided L5-S1 neural foraminal stenosis.  PROCEDURE:  L5-S1 decompression with laminectomy and bilateral partial facetectomy and liberal left-sided L5-S1 foraminotomy.  SURGEON:  Phylliss Bob, MD.  ASSISTANTPricilla Holm, PA-C.  ANESTHESIA:  General endotracheal anesthesia.  COMPLICATIONS:  Very small durotomy x1, uneventfully repaired with a 4-0 Nurolon stitch.  DISPOSITION:  Stable.  ESTIMATED BLOOD LOSS:  Minimal.  INDICATIONS FOR SURGERY:  Briefly, Mr. Las is a very pleasant 69- year-old male, who did present to me with ongoing and severe pain in the left leg.  The patient's pain was very consistent with left L5 radiculopathy.  The patient did have multiple left L5 selective nerve blocks, which did help in the past, but more recently, were not benefitting him.  His MRI did reveal neuroforaminal stenosis on the left at L5-S1.  Given this, we did discuss proceeding with the procedure noted above.  The patient was fully aware of the risks and limitations of surgery as outlined in my preoperative note.  OPERATIVE DETAILS:  On January 26, 2015, the patient was brought to surgery and general endotracheal anesthesia was administered.  The patient was placed prone on a well-padded flat Jackson bed with a spinal frame.  Antibiotics were given and a time-out procedure was performed. A midline incision was then made overlying the L5-S1 intervertebral space.  The fascia was  incised at the midline.  A lateral intraoperative radiograph did confirm the appropriate operative level.  I then proceeded with removing the L5 spinous process and the central lamina at L5.  A bilateral partial facetectomy was performed.  Of note, there was a very small meniscal spike of bone over the midline, which did result in a very small durotomy at the midline at the L5-S1 intervertebral space.  This was uneventfully repaired using a 4-0 Nurolon stitch. There was no extravasation of cerebrospinal fluid noted upon the completion of the closure.  I then proceeded with the decompression.  I turned my attention towards the left side.  A liberal L5-S1 partial facetectomy was performed.  I did identify the L5 pedicle and the exiting L5 nerve.  I did continue the decompression, and was able to trace the L5 nerve out the left neural foramen and past the L5 and S1 pedicles.  In doing so, I was able to adequately decompress the nerve in its entirety.  I was able to adequately decompress the nerve root in its entirety at the level of the spinal canal to the lateral aspect of the pedicles.  The wound was then copiously irrigated.  There were minor areas of bleeding, which was controlled using Surgiflo.  The wound was then closed in layers using #1 Vicryl, followed by 2-0 Vicryl, followed by  3-0 Monocryl.  Benzoin and Steri-Strips were applied followed by sterile dressing.  All instrument counts were correct at the termination of the procedure.  Of note, Pricilla Holm was my assistant throughout surgery, and did aid in retraction, suctioning, and closure.     Phylliss Bob, MD     MD/MEDQ  D:  01/26/2015  T:  01/27/2015  Job:  [id number]

## 2015-01-30 ENCOUNTER — Encounter (HOSPITAL_COMMUNITY): Payer: Self-pay

## 2015-02-01 ENCOUNTER — Encounter (HOSPITAL_COMMUNITY): Payer: Self-pay

## 2015-02-03 ENCOUNTER — Encounter (HOSPITAL_COMMUNITY): Payer: Self-pay

## 2015-02-06 ENCOUNTER — Encounter (HOSPITAL_COMMUNITY): Payer: Self-pay

## 2015-02-08 ENCOUNTER — Encounter (HOSPITAL_COMMUNITY): Payer: Self-pay

## 2015-02-10 ENCOUNTER — Encounter (HOSPITAL_COMMUNITY): Payer: Self-pay

## 2015-02-13 ENCOUNTER — Encounter (HOSPITAL_COMMUNITY): Payer: Self-pay

## 2015-02-15 ENCOUNTER — Encounter (HOSPITAL_COMMUNITY): Payer: Self-pay

## 2015-02-17 ENCOUNTER — Encounter (HOSPITAL_COMMUNITY): Payer: Self-pay

## 2015-02-22 ENCOUNTER — Encounter (HOSPITAL_COMMUNITY): Payer: Self-pay

## 2015-02-24 ENCOUNTER — Encounter (HOSPITAL_COMMUNITY): Payer: Self-pay

## 2015-03-11 ENCOUNTER — Other Ambulatory Visit: Payer: Self-pay | Admitting: Cardiovascular Disease

## 2015-03-23 ENCOUNTER — Other Ambulatory Visit (HOSPITAL_COMMUNITY): Payer: Self-pay | Admitting: Internal Medicine

## 2015-03-23 DIAGNOSIS — I6523 Occlusion and stenosis of bilateral carotid arteries: Secondary | ICD-10-CM

## 2015-03-27 ENCOUNTER — Telehealth: Payer: Self-pay | Admitting: Cardiovascular Disease

## 2015-03-27 NOTE — Telephone Encounter (Signed)
Spoke with pt. He reports he has been in cardiac rehab maintenance program for several years. Had recent back surgery and needs approval from Dr. Angelena Form to resume cardiac rehab.  I told pt I would follow up with cardiac rehab regarding necessary paperwork. Message sent to cardiac rehab.

## 2015-03-27 NOTE — Telephone Encounter (Signed)
New message     Pt wants to speak to rn

## 2015-03-29 NOTE — Telephone Encounter (Signed)
Form received in office. Dr. Angelena Form can complete when he returns to office on 04/03/15

## 2015-04-03 ENCOUNTER — Encounter (HOSPITAL_COMMUNITY)
Admission: RE | Admit: 2015-04-03 | Discharge: 2015-04-03 | Disposition: A | Discharge status: Home / Self Care | Payer: Self-pay | Source: Ambulatory Visit | Attending: Cardiovascular Disease | Admitting: Cardiovascular Disease

## 2015-04-03 DIAGNOSIS — I213 ST elevation (STEMI) myocardial infarction of unspecified site: Secondary | ICD-10-CM | POA: Insufficient documentation

## 2015-04-03 DIAGNOSIS — Z951 Presence of aortocoronary bypass graft: Secondary | ICD-10-CM | POA: Insufficient documentation

## 2015-04-03 NOTE — Telephone Encounter (Signed)
Completed form faxed to cardiac rehab. Pt notified.

## 2015-04-05 ENCOUNTER — Encounter (HOSPITAL_COMMUNITY)
Admission: RE | Admit: 2015-04-05 | Discharge: 2015-04-05 | Disposition: A | Discharge status: Home / Self Care | Payer: Self-pay | Source: Ambulatory Visit | Attending: Cardiovascular Disease | Admitting: Cardiovascular Disease

## 2015-04-07 ENCOUNTER — Encounter (HOSPITAL_COMMUNITY): Payer: Self-pay

## 2015-04-10 ENCOUNTER — Encounter (HOSPITAL_COMMUNITY): Payer: Self-pay

## 2015-04-10 ENCOUNTER — Telehealth: Payer: Self-pay | Admitting: *Deleted

## 2015-04-10 ENCOUNTER — Ambulatory Visit (HOSPITAL_COMMUNITY)
Admission: RE | Admit: 2015-04-10 | Discharge: 2015-04-10 | Disposition: A | Discharge status: Home / Self Care | Payer: Medicare Other | Source: Ambulatory Visit | Attending: Cardiology | Admitting: Cardiology

## 2015-04-10 DIAGNOSIS — I6523 Occlusion and stenosis of bilateral carotid arteries: Secondary | ICD-10-CM | POA: Insufficient documentation

## 2015-04-10 DIAGNOSIS — E785 Hyperlipidemia, unspecified: Secondary | ICD-10-CM | POA: Diagnosis not present

## 2015-04-10 DIAGNOSIS — I2510 Atherosclerotic heart disease of native coronary artery without angina pectoris: Secondary | ICD-10-CM

## 2015-04-10 DIAGNOSIS — I10 Essential (primary) hypertension: Secondary | ICD-10-CM | POA: Diagnosis not present

## 2015-04-10 MED ORDER — NITROGLYCERIN 0.4 MG SL SUBL: 0.4000 mg | SUBLINGUAL_TABLET | SUBLINGUAL | Status: DC | PRN

## 2015-04-10 NOTE — Telephone Encounter (Signed)
LATE ENTRY PATIENT IN EARLIER TODAY FOR  CAROTID DOPPLER PATIENT INFORMED TECH . HE HAS BEEN HAVING LIGHT TIGHTNESS IN HIS CHEST , SWEATING  BACK PAIN WHEN HE WAS HELPING HIS DOG UP THE STEPS. PATIENT STATES HE DID NOT HAVE THE SYMPTOMS  WHEN HE WAS IN [facility] LA[facility] WEEK PATIENT STATES HE HAS NOT USED NTG AT ALL. NEW PRESCRIPTION SENT TO PHARMACY NO DISCOMFORT AT PRESENT APPOINTMENT SCHEDULE 04/12/15 WITH LAURA INGOLD NP AWARE SYMPTOMS REOCCUR WITH OUT RELIEF CALL 911.

## 2015-04-11 NOTE — Progress Notes (Signed)
Cardiology Office Note   Date:  04/12/2015   ID:  Samuel Coleman, DOB October 28, 1945, MRN [id number]  PCP:  Jani Gravel, MD  Cardiologist:  Dr. Angelena Form    Chief Complaint  Patient presents with  . Chest Pain      History of Present Illness: Samuel Coleman is a 70 y.o. male who presents for chest pain. He was getting carotid dopplers yesterday and complained of chest pain.    He has a history of CAD, HLD, HTN, thoracic aortic aneurysm, carotid artery disease who is here today for chest pain.   He was admitted to [facility] 01/01/10 with an acute inferior STEMI. Emergent cardiac cath showed severe three vessel CAD. He underwent 3V CABG later that day with Dr. Cyndia Bent performing. (LIMA to LAD, SVG to OM, SVG to RCA). He was admitted to [facility] August 2013 for abdominal pain and felt to have ischemic colitis and his beta blocker dose was reduced. He also had some bradycardia. He was seen by Truitt Merle, NP in January 2014 and was doing well but c/o skipped beats. Event monitor with PVCs, NSR. Stress myoview without ischemia April 2014. BP much better on Lisinopril increased dose.   Pt states he has been having chest pain. Usually occurs with exertion.  At times it radiates to Lt shoulder.  No associated nausea or SOB. Has not had any episodes for over 3 days now.    Past Medical History  Diagnosis Date  . Thoracic aortic aneurysm ([place])      stable on CT 01/13/10;  Chest/abdominal CTA (11/2011):  3.9 x 3.6 cm prox descending thoracic aorta (stable)  . Hyperlipidemia   . Gout   . GERD (gastroesophageal reflux disease)   . Hypertension   . Ischemic colitis ([place])     noted on CT back in August 2013 possible related to hypotension  . ST elevation myocardial infarction (STEMI) of inferior wall ([place]) 2011    s/p CABG 2011  . Asthma     exercise induced  . PUD (peptic ulcer disease) 1974  . History of kidney stones     no problems with stones  . Anxiety   . Back pain,  chronic     gets injections in back  . CAD (coronary artery disease)     a.  prior inferior MI; s/p 3V CABG 01/01/10 (LIMA to LAD, SVG to OM, SVG to RCA- Dr.Bartle);  b. Last Myoview (05/2012):  Normal; EF 70%  . Hx of echocardiogram     Echo (01/2010):  EF 55-60%, ? Apical HK, mild BAE, atrial septal aneurysm.  . PVC's (premature ventricular contractions)     Event monitor in 04/2012: NSR, PVCs.  . Carotid stenosis     a. Carotid US (01/2011):  Bilateral ICA 0-39% (repeat in 1 year).;  b.  Carotid US (123XX123): RICA 123456; LICA 123456. Follow up 1 year;  c.  Carotid US (2/16):  R 40-59%; L 1-39%; FU 1 year.  Marland Kitchen Dysrhythmia     PVC's  . Pneumonia     x 2  . Arthritis     Past Surgical History  Procedure Laterality Date  . Tonsillectomy    . Excision of deep left neck mass. surgeon:  christopher e. newman, [place]  . Cardiac catheterization    . Chest tube insertion  1992    fx lt ribs-collapsed lung  . Eye surgery  both cararacts  . Ear cyst excision Left 09/02/2012  Procedure: EXCISION SEBACEOUS CYST LEFT POSTERIOR LEG;  Surgeon: Pedro Earls, MD;  Location: [facility];  Service: General;  Laterality: Left;  . Coronary artery bypass graft  11/11    X 3 VESSELS  . Hernia repair    . Inguinal hernia repair Right 12/02/2012    Procedure: OPEN RIGHT INGUINAL HERINA;  Surgeon: Pedro Earls, MD;  Location: [facility];  Service: General;  Laterality: Right;  . Insertion of mesh Right 12/02/2012    Procedure: INSERTION OF MESH;  Surgeon: Pedro Earls, MD;  Location: [facility];  Service: General;  Laterality: Right;  . Cyst removel on right buttocks - 2014    . Cyst removed from back of neck    . Inguinal hernia repair Left 11/02/2014    Procedure: OPEN LEFT INGUINAL HERNIA REPAIR WITH MESH;  Surgeon: Johnathan Hausen, MD;  Location: [facility];  Service: General;  Laterality: Left;  . Lumbar laminectomy/decompression microdiscectomy N/A 01/26/2015    Procedure: Lumbar 5-sacrum  1 decompression;  Surgeon: Phylliss Bob, MD;  Location: [facility];  Service: Orthopedics;  Laterality: N/A;     Current Outpatient Prescriptions  Medication Sig Dispense Refill  . allopurinol (ZYLOPRIM) 300 MG tablet Take 300 mg by mouth every morning.     . Cholecalciferol (VITAMIN D3) 3000 UNITS TABS Take 300 mg by mouth daily.    . clotrimazole (LOTRIMIN) 1 % cream Apply 1 application topically 2 (two) times daily.    Marland Kitchen dexlansoprazole (DEXILANT) 60 MG capsule Take 60 mg by mouth every morning.     . hydrocortisone cream 1 % Apply 1 application topically 2 (two) times daily.    Marland Kitchen lisinopril (PRINIVIL,ZESTRIL) 20 MG tablet TAKE 1 TABLET (20 MG TOTAL) BY MOUTH 2 (TWO) TIMES DAILY. (Patient taking differently: patient taking 2 tabs (40mg ) in the morning and 1 tab (20mg ) in the evening) 180 tablet 0  . lisinopril (PRINIVIL,ZESTRIL) 40 MG tablet Take 40 mg by mouth every morning.    . metoprolol (LOPRESSOR) 50 MG tablet Take 1 tablet (50 mg total) by mouth 2 (two) times daily. 60 tablet 11  . Multiple Vitamin (MULTIVITAMIN WITH MINERALS) TABS tablet Take 1 tablet by mouth every morning.     . neomycin-bacitracin-polymyxin (NEOSPORIN) [id number] ointment Apply 1 application topically 2 (two) times daily.    . nitroGLYCERIN (NITROSTAT) 0.4 MG SL tablet Place 1 tablet (0.4 mg total) under the tongue every 5 (five) minutes as needed for chest pain. 25 tablet 6  . simvastatin (ZOCOR) 40 MG tablet Take 40 mg by mouth every morning.     . vitamin B-12 (CYANOCOBALAMIN) 500 MCG tablet Take 500 mcg by mouth daily.    . vitamin C (ASCORBIC ACID) 500 MG tablet Take 1,000 mg by mouth every morning.      No current facility-administered medications for this visit.    Allergies:   Levaquin    Social History:  The patient  reports that he quit smoking about 41 years ago. He has never used smokeless tobacco. He reports that he drinks alcohol. He reports that he does not use illicit drugs.   Family History:   The patient's family history includes Cancer in his maternal grandmother, sister, and sister; Heart failure in his mother; Pneumonia in his father.    ROS:  General:no colds or fevers, no weight changes Skin:no rashes or ulcers HEENT:no blurred vision, no congestion CV:see HPI PUL:see HPI GI:no diarrhea constipation or melena, no indigestion GU:no hematuria, no dysuria MS:no  joint pain, no claudication Neuro:no syncope, no lightheadedness Endo:no diabetes, no thyroid disease  Wt Readings from Last 3 Encounters:  04/12/15 204 lb (92.534 kg)  01/12/15 202 lb 3.2 oz (91.717 kg)  11/02/14 200 lb (90.719 kg)     PHYSICAL EXAM: VS:  BP 136/88 mmHg  Pulse 72  Ht 5\' 10"  (1.778 m)  Wt 204 lb (92.534 kg)  BMI 29.27 kg/m2 , BMI Body mass index is 29.27 kg/(m^2). General:Pleasant affect, NAD Skin:Warm and dry, brisk capillary refill HEENT:normocephalic, sclera clear, mucus membranes moist Neck:supple, no JVD, no bruits  Heart:S1S2 RRR without murmur, gallup, rub or click Lungs:clear without rales, rhonchi, or wheezes [id number], non tender, + BS, do not palpate liver spleen or masses Ext:no lower ext edema, 2+ pedal pulses, 2+ radial pulses Neuro:alert and oriented, MAE, follows commands, + facial symmetry    EKG:  EKG is ordered today. The ekg ordered today demonstrates SR rightward axis, no acute changes.    Recent Labs: 01/12/2015: ALT 27; BUN 20; Creatinine, Ser 1.03; Hemoglobin 15.9; Platelets 159; Potassium 4.4; Sodium 139    Lipid Panel No results found for: CHOL, TRIG, HDL, CHOLHDL, VLDL, LDLCALC, LDLDIRECT     Other studies Reviewed: Additional studies/ records that were reviewed today include: Caroid dopplers discussed with pt.    ASSESSMENT AND PLAN:  1. CAD: having episodes of chest discomfort with exertion. previous. Stress myoview April 2014 without ischemia. Continue beta blocker, ASA, statin, Ace-inh. Will schedule a exercise myoview and follow up with Dr.  Angelena Form.  If Positive we discussed possible need of cath.   2. HTN: BP controlled. No changes   3. HLD: Continue statin. Lipids well controlled per pt, checked in primary care.   4. Thoracic aortic aneurysm: CTA chest December 2015 with stable 3.9 proximal descending thoracic aorta.   5. Palpitations: PVCs controlled on higher dose of Lopressor.   6. Carotid artery disease: Last dopplers February 2017 with 40-59% RICA stenosis. [id number] LICA stenosis. No changes.  Repeat February 2018.     Current medicines are reviewed with the patient today.  The patient Has no concerns regarding medicines.  The following changes have been made:  See above Labs/ tests ordered today include:see above  Disposition:   FU:  see above  Signed, Isaiah Serge, NP  04/12/2015 10:05 AM    [facility] [place], [place], [place] [place], [place] Phone: [phone]; Fax: [phone]

## 2015-04-12 ENCOUNTER — Ambulatory Visit (INDEPENDENT_AMBULATORY_CARE_PROVIDER_SITE_OTHER): Payer: Medicare Other | Admitting: Cardiology

## 2015-04-12 ENCOUNTER — Encounter (HOSPITAL_COMMUNITY)
Admission: RE | Admit: 2015-04-12 | Discharge: 2015-04-12 | Disposition: A | Discharge status: Home / Self Care | Payer: Self-pay | Source: Ambulatory Visit | Attending: Cardiovascular Disease | Admitting: Cardiovascular Disease

## 2015-04-12 ENCOUNTER — Encounter: Payer: Self-pay | Admitting: Cardiology

## 2015-04-12 VITALS — BP 136/88 | HR 72 | Ht 70.0 in | Wt 204.0 lb

## 2015-04-12 DIAGNOSIS — R079 Chest pain, unspecified: Secondary | ICD-10-CM | POA: Diagnosis not present

## 2015-04-12 DIAGNOSIS — I6523 Occlusion and stenosis of bilateral carotid arteries: Secondary | ICD-10-CM

## 2015-04-12 DIAGNOSIS — E785 Hyperlipidemia, unspecified: Secondary | ICD-10-CM | POA: Diagnosis not present

## 2015-04-12 DIAGNOSIS — I25118 Atherosclerotic heart disease of native coronary artery with other forms of angina pectoris: Secondary | ICD-10-CM

## 2015-04-12 DIAGNOSIS — I200 Unstable angina: Secondary | ICD-10-CM

## 2015-04-12 NOTE — Patient Instructions (Addendum)
Medication Instructions:  None  Labwork: None  Testing/Procedures: Your physician has requested that you have en exercise stress myoview this week or the beginning of next week. For further information please visit [email or web address]. Please follow instruction sheet, as given.   Follow-Up: Your physician recommends that you schedule a follow-up appointment in: 2-3 weeks with Dr. Angelena Form or an extender on a day that Dr. Angelena Form is in the office.   Any Other Special Instructions Will Be Listed Below (If Applicable).     If you need a refill on your cardiac medications before your next appointment, please call your pharmacy.

## 2015-04-14 ENCOUNTER — Telehealth (HOSPITAL_COMMUNITY): Payer: Self-pay

## 2015-04-14 ENCOUNTER — Encounter (HOSPITAL_COMMUNITY)
Admission: RE | Admit: 2015-04-14 | Discharge: 2015-04-14 | Disposition: A | Discharge status: Home / Self Care | Payer: Self-pay | Source: Ambulatory Visit | Attending: Cardiovascular Disease | Admitting: Cardiovascular Disease

## 2015-04-14 NOTE — Telephone Encounter (Signed)
Encounter complete. 

## 2015-04-17 ENCOUNTER — Encounter (HOSPITAL_COMMUNITY)
Admission: RE | Admit: 2015-04-17 | Discharge: 2015-04-17 | Disposition: A | Discharge status: Home / Self Care | Payer: Self-pay | Source: Ambulatory Visit | Attending: Cardiovascular Disease | Admitting: Cardiovascular Disease

## 2015-04-18 ENCOUNTER — Telehealth (HOSPITAL_COMMUNITY): Payer: Self-pay

## 2015-04-18 NOTE — Telephone Encounter (Signed)
Encounter complete. 

## 2015-04-19 ENCOUNTER — Encounter (HOSPITAL_COMMUNITY): Payer: Self-pay

## 2015-04-19 ENCOUNTER — Ambulatory Visit (HOSPITAL_COMMUNITY)
Admission: RE | Admit: 2015-04-19 | Discharge: 2015-04-19 | Disposition: A | Discharge status: Home / Self Care | Payer: Medicare Other | Source: Ambulatory Visit | Attending: Cardiovascular Disease | Admitting: Cardiovascular Disease

## 2015-04-19 DIAGNOSIS — R61 Generalized hyperhidrosis: Secondary | ICD-10-CM | POA: Diagnosis not present

## 2015-04-19 DIAGNOSIS — Z8249 Family history of ischemic heart disease and other diseases of the circulatory system: Secondary | ICD-10-CM | POA: Diagnosis not present

## 2015-04-19 DIAGNOSIS — I779 Disorder of arteries and arterioles, unspecified: Secondary | ICD-10-CM | POA: Diagnosis not present

## 2015-04-19 DIAGNOSIS — Z87891 Personal history of nicotine dependence: Secondary | ICD-10-CM | POA: Diagnosis not present

## 2015-04-19 DIAGNOSIS — I200 Unstable angina: Secondary | ICD-10-CM | POA: Insufficient documentation

## 2015-04-19 DIAGNOSIS — I10 Essential (primary) hypertension: Secondary | ICD-10-CM | POA: Insufficient documentation

## 2015-04-19 DIAGNOSIS — R079 Chest pain, unspecified: Secondary | ICD-10-CM | POA: Insufficient documentation

## 2015-04-19 DIAGNOSIS — R42 Dizziness and giddiness: Secondary | ICD-10-CM | POA: Diagnosis not present

## 2015-04-19 DIAGNOSIS — R0609 Other forms of dyspnea: Secondary | ICD-10-CM | POA: Insufficient documentation

## 2015-04-19 DIAGNOSIS — K219 Gastro-esophageal reflux disease without esophagitis: Secondary | ICD-10-CM | POA: Diagnosis not present

## 2015-04-19 DIAGNOSIS — R9439 Abnormal result of other cardiovascular function study: Secondary | ICD-10-CM | POA: Insufficient documentation

## 2015-04-19 LAB — MYOCARDIAL PERFUSION IMAGING
CHL CUP MPHR: 149 {beats}/min
CHL CUP NUCLEAR SRS: 1
CHL CUP RESTING HR STRESS: 86 {beats}/min
CSEPHR: 91 %
Estimated workload: 8.5 METS
Exercise duration (min): 7 min
LV sys vol: 36 mL
LVDIAVOL: 87 mL
Peak HR: 136 {beats}/min
RPE: 15
SSS: 1
TID: 1.15

## 2015-04-19 MED ORDER — TECHNETIUM TC 99M SESTAMIBI GENERIC - CARDIOLITE
10.7000 | Freq: Once | INTRAVENOUS | Status: AC | PRN
  Administered 2015-04-19: 10.7 via INTRAVENOUS

## 2015-04-19 MED ORDER — TECHNETIUM TC 99M SESTAMIBI GENERIC - CARDIOLITE
30.2000 | Freq: Once | INTRAVENOUS | Status: AC | PRN
  Administered 2015-04-19: 30.2 via INTRAVENOUS

## 2015-04-21 ENCOUNTER — Encounter (HOSPITAL_COMMUNITY)
Admission: RE | Admit: 2015-04-21 | Discharge: 2015-04-21 | Disposition: A | Discharge status: Home / Self Care | Payer: Self-pay | Source: Ambulatory Visit | Attending: Cardiovascular Disease | Admitting: Cardiovascular Disease

## 2015-04-24 ENCOUNTER — Telehealth: Payer: Self-pay | Admitting: Cardiovascular Disease

## 2015-04-24 ENCOUNTER — Encounter (HOSPITAL_COMMUNITY): Payer: Self-pay

## 2015-04-24 NOTE — Telephone Encounter (Signed)
Left message to call back  

## 2015-04-24 NOTE — Telephone Encounter (Signed)
Pt returning call from last week,thinks it was about his test results.

## 2015-04-24 NOTE — Telephone Encounter (Signed)
Pt given results of stress test.  Pt stated he feels fine no c/o of chest pain or pressure since last seen.  Pt reminded to keep scheduled appt with Kathlen Mody on 3/9

## 2015-04-26 ENCOUNTER — Encounter (HOSPITAL_COMMUNITY): Payer: Self-pay

## 2015-04-26 DIAGNOSIS — I213 ST elevation (STEMI) myocardial infarction of unspecified site: Secondary | ICD-10-CM | POA: Insufficient documentation

## 2015-04-26 DIAGNOSIS — Z951 Presence of aortocoronary bypass graft: Secondary | ICD-10-CM | POA: Insufficient documentation

## 2015-04-27 ENCOUNTER — Encounter: Payer: Self-pay | Admitting: Cardiovascular Disease

## 2015-04-28 ENCOUNTER — Encounter (HOSPITAL_COMMUNITY): Payer: Self-pay

## 2015-04-28 ENCOUNTER — Encounter (HOSPITAL_COMMUNITY)
Admission: RE | Admit: 2015-04-28 | Discharge: 2015-04-28 | Disposition: A | Discharge status: Home / Self Care | Payer: Self-pay | Source: Ambulatory Visit | Attending: Cardiovascular Disease | Admitting: Cardiovascular Disease

## 2015-05-01 ENCOUNTER — Encounter (HOSPITAL_COMMUNITY)
Admission: RE | Admit: 2015-05-01 | Discharge: 2015-05-01 | Disposition: A | Discharge status: Home / Self Care | Payer: Self-pay | Source: Ambulatory Visit | Attending: Cardiovascular Disease | Admitting: Cardiovascular Disease

## 2015-05-01 ENCOUNTER — Encounter (HOSPITAL_COMMUNITY): Payer: Self-pay

## 2015-05-03 ENCOUNTER — Encounter (HOSPITAL_COMMUNITY): Payer: Self-pay

## 2015-05-03 NOTE — Progress Notes (Signed)
Cardiology Office Note:    Date:  05/04/2015   ID:  Samuel Coleman, DOB 1945/11/20, MRN [id number]  PCP:  Jani Gravel, MD  Cardiologist:  Dr. Lauree Chandler   Electrophysiologist:  n/a  Chief Complaint  Patient presents with  . Chest Pain    Follow up    History of Present Illness:     Samuel Coleman is a 70 y.o. male with a hx of CAD, s/p inferior STEMI and subsequent CABG (L-LAD, S-OM, S-RCA) in 12/2009, HTN, HL, prior ischemic colitis, thoracic aortic aneurysm, GERD. Last seen by Dr. Lauree Chandler in 6/16.    Evaluated by Cecilie Kicks, NP 2/17 for chest pain with exertion.  Myoview was arranged. This was low risk with diaph atten and no ischemia. He returns for FU.  Overall, he is doing well. He does note a clammy feeling with bending over or when he stands up quickly. He might feel a little bit lightheaded with this. He really denies any chest discomfort with exertion. He's been going to cardiac rehabilitation without significant symptoms. Denies significant dyspnea. Denies syncope or near syncope. Denies orthopnea, PND or significant edema.     Past Medical History  Diagnosis Date  . Thoracic aortic aneurysm ([place])      stable on CT 01/13/10;  Chest/abdominal CTA (11/2011):  3.9 x 3.6 cm prox descending thoracic aorta (stable)  . Hyperlipidemia   . Gout   . GERD (gastroesophageal reflux disease)   . Hypertension   . Ischemic colitis ([place])     noted on CT back in August 2013 possible related to hypotension  . ST elevation myocardial infarction (STEMI) of inferior wall ([place]) 2011    s/p CABG 2011  . Asthma     exercise induced  . PUD (peptic ulcer disease) 1974  . History of kidney stones     no problems with stones  . Anxiety   . Back pain, chronic     gets injections in back  . CAD (coronary artery disease)     a.  prior inferior MI; s/p 3V CABG 01/01/10 (LIMA to LAD, SVG to OM, SVG to RCA- Dr.Bartle);  b. Last Myoview (05/2012):  Normal; EF 70%  .  Hx of echocardiogram     Echo (01/2010):  EF 55-60%, ? Apical HK, mild BAE, atrial septal aneurysm.  . PVC's (premature ventricular contractions)     Event monitor in 04/2012: NSR, PVCs.  . Carotid stenosis     a. Carotid US (01/2011):  Bilateral ICA 0-39% (repeat in 1 year).;  b.  Carotid US (123XX123): RICA 123456; LICA 123456. Follow up 1 year;  c.  Carotid US (2/16):  R 40-59%; L 1-39%; FU 1 year.  Marland Kitchen Dysrhythmia     PVC's  . Pneumonia     x 2  . Arthritis     Past Surgical History  Procedure Laterality Date  . Tonsillectomy    . Excision of deep left neck mass. surgeon:  christopher e. newman, [place]  . Cardiac catheterization    . Chest tube insertion  1992    fx lt ribs-collapsed lung  . Eye surgery  both cararacts  . Ear cyst excision Left 09/02/2012    Procedure: EXCISION SEBACEOUS CYST LEFT POSTERIOR LEG;  Surgeon: Pedro Earls, MD;  Location: [facility];  Service: General;  Laterality: Left;  . Coronary artery bypass graft  11/11    X 3 VESSELS  . Hernia repair    .  Inguinal hernia repair Right 12/02/2012    Procedure: OPEN RIGHT INGUINAL HERINA;  Surgeon: Pedro Earls, MD;  Location: [facility];  Service: General;  Laterality: Right;  . Insertion of mesh Right 12/02/2012    Procedure: INSERTION OF MESH;  Surgeon: Pedro Earls, MD;  [facility]: [facility];  Service: General;  Laterality: Right;  . Cyst removel on right buttocks - 2014    . Cyst removed from back of neck    . Inguinal hernia repair Left 11/02/2014    Procedure: OPEN LEFT INGUINAL HERNIA REPAIR WITH MESH;  Surgeon: Johnathan Hausen, MD;  Location: [facility];  Service: General;  Laterality: Left;  . Lumbar laminectomy/decompression microdiscectomy N/A 01/26/2015    Procedure: Lumbar 5-sacrum 1 decompression;  Surgeon: Phylliss Bob, MD;  Location: [facility];  Service: Orthopedics;  Laterality: N/A;    Current Medications: Outpatient Prescriptions Prior to Visit  Medication Sig Dispense Refill  .  allopurinol (ZYLOPRIM) 300 MG tablet Take 300 mg by mouth every morning.     . Cholecalciferol (VITAMIN D3) 3000 UNITS TABS Take 3,000 Units by mouth daily.     . clotrimazole (LOTRIMIN) 1 % cream Apply 1 application topically 2 (two) times daily as needed (FOR FOOT RASH).     Marland Kitchen dexlansoprazole (DEXILANT) 60 MG capsule Take 60 mg by mouth every morning.     . hydrocortisone cream 1 % Apply 1 application topically 2 (two) times daily.    Marland Kitchen lisinopril (PRINIVIL,ZESTRIL) 40 MG tablet Take 40 mg by mouth every morning.    . metoprolol (LOPRESSOR) 50 MG tablet Take 1 tablet (50 mg total) by mouth 2 (two) times daily. 60 tablet 11  . Multiple Vitamin (MULTIVITAMIN WITH MINERALS) TABS tablet Take 1 tablet by mouth every morning.     . neomycin-bacitracin-polymyxin (NEOSPORIN) [phone] ointment Apply 1 application topically 2 (two) times daily as needed (FOR FOOT IRRITATION).     Marland Kitchen simvastatin (ZOCOR) 40 MG tablet Take 40 mg by mouth every morning.     . vitamin B-12 (CYANOCOBALAMIN) 500 MCG tablet Take 500 mcg by mouth daily.    . vitamin C (ASCORBIC ACID) 500 MG tablet Take 1,000 mg by mouth every morning.     Marland Kitchen lisinopril (PRINIVIL,ZESTRIL) 20 MG tablet TAKE 1 TABLET (20 MG TOTAL) BY MOUTH 2 (TWO) TIMES DAILY. (Patient taking differently: patient taking 2 tabs (40mg ) in the morning and 1 tab (20mg ) in the evening) 180 tablet 0  . nitroGLYCERIN (NITROSTAT) 0.4 MG SL tablet Place 1 tablet (0.4 mg total) under the tongue every 5 (five) minutes as needed for chest pain. 25 tablet 6   No facility-administered medications prior to visit.     Allergies:   Levaquin   Social History   Social History  . Marital Status: Widowed    Spouse Name: N/A  . Number of Children: N/A  . Years of Education: N/A   Social History Main Topics  . Smoking status: Former Smoker    Quit date: 02/25/1974  . Smokeless tobacco: Never Used  . Alcohol Use: Yes     Comment: rarely  . Drug Use: No  . Sexual Activity:  Not Asked   Other Topics Concern  . None   Social History Narrative     Family History:  The patient's family history includes Cancer in his maternal grandmother, sister, and sister; Heart failure in his mother; Pneumonia in his father.   ROS:   Please see the history of present illness.  Review of Systems  Constitution: Positive for chills.  Cardiovascular: Positive for chest pain.  Respiratory: Positive for snoring and wheezing.   Musculoskeletal: Positive for back pain.  Neurological: Positive for dizziness.  Psychiatric/Behavioral: The patient is nervous/anxious.    All other systems reviewed and are negative.   Physical Exam:    VS:  BP 120/76 mmHg  Pulse 70  Ht 5\' 10"  (1.778 m)  Wt 201 lb 12.8 oz (91.536 kg)  BMI 28.96 kg/m2   GEN: Well nourished, well developed, in no acute distress HEENT: normal Neck: no JVD, no masses Cardiac: Normal S1/S2, RRR; no murmurs,  no edema;  Respiratory:  clear to auscultation bilaterally; no wheezing, rhonchi or rales GI: soft, nontender  MS: no deformity or atrophy Skin: warm and dry  Neuro: no focal deficits  Psych: Alert and oriented x 3, normal affect  Wt Readings from Last 3 Encounters:  05/04/15 201 lb 12.8 oz (91.536 kg)  04/19/15 204 lb (92.534 kg)  04/12/15 204 lb (92.534 kg)      Studies/Labs Reviewed:     EKG:  EKG is  ordered today.  The ekg ordered today demonstrates NSR, HR 70, Normal axis, ant Q waves, QTc 397 ms  Recent Labs: 01/12/2015: ALT 27; BUN 20; Creatinine, Ser 1.03; Hemoglobin 15.9; Platelets 159; Potassium 4.4; Sodium 139   Recent Lipid Panel No results found for: CHOL, TRIG, HDL, CHOLHDL, VLDL, LDLCALC, LDLDIRECT  Additional studies/ records that were reviewed today include:   Carotid US 04/10/15 Stable 40-59% RICA stenosis. Stable 123456 LICA stenosis. F/U 1 year.  Myoview 04/19/15 EF 58%, hypertensive BP response, diaph attenuation, no ischemia; Low Risk  Chest CTA  12/15 IMPRESSION: Stable saccular aneurysm in the proximal descending aorta with a maximal diameter of 3.9 cm.  Echo (01/2010):  EF 55-60%, ? Apical HK, mild BAE, atrial septal aneurysm.   Carotid US (03/2013):  RICA 123456; LICA 123456 (repeat in 1 year).   Chest/abdominal CTA (11/2011):  3.9 x 3.6 cm prox descending thoracic aorta (stable).   Last Myoview (05/2012): Normal; EF 70%.   Event monitor in 04/2012:  NSR, PVCs.    ASSESSMENT:     1. Coronary artery disease involving native coronary artery of native heart without angina pectoris   2. Essential hypertension   3. Hyperlipidemia   4. Carotid stenosis, bilateral   5. Thoracic aortic aneurysm without rupture ([place])     PLAN:     In order of problems listed above:  1. CAD - No angina.  Continue ASA, beta blocker, statin.  We reviewed the findings on his stress test today.   2. HTN - Controlled.   3. HL - Continue statin.   4. Carotid Stenosis - Stable by recent US.  Repeat due in 2/18.  5. Thoracic Aortic Aneurysm - Stable by CT in 12/15. Plan repeat CTA 12/17.   Medication Adjustments/Labs and Tests Ordered: Current medicines are reviewed at length with the patient today.  Concerns regarding medicines are outlined above.  Medication changes, Labs and Tests ordered today are outlined in the Patient Instructions noted below. Patient Instructions  Medication Instructions:   Your physician recommends that you continue on your current medications as directed. Please refer to the Current Medication list given to you today.  If you need a refill on your cardiac medications before your next appointment, please call your pharmacy.  Labwork:  NONE ORDER TODAY  Testing/Procedures:  Non-Cardiac CT Angiography (CTA), is a special type of CT scan that  uses a computer to produce multi-dimensional views of major blood vessels throughout the body. In CT angiography, a contrast material is injected through an IV to  help visualize the blood vessels NEDS TO BE DONE IN 01/2016  Follow-Up:  Your physician wants you to follow-up in: [place] will receive a reminder letter in the mail two months in advance. If you don't receive a letter, please call our office to schedule the follow-up appointment.  Any Other Special Instructions Will Be Listed Below (If Applicable).   Signed, Richardson Dopp, PA-C  05/04/2015 5:42 PM    [facility] [place], [place], [place]  [address] Phone: [phone]; Fax: [phone]

## 2015-05-04 ENCOUNTER — Ambulatory Visit (INDEPENDENT_AMBULATORY_CARE_PROVIDER_SITE_OTHER): Payer: Medicare Other | Admitting: Physician Assistant

## 2015-05-04 ENCOUNTER — Encounter: Payer: Self-pay | Admitting: Physician Assistant

## 2015-05-04 VITALS — BP 120/76 | HR 70 | Ht 70.0 in | Wt 201.8 lb

## 2015-05-04 DIAGNOSIS — I2510 Atherosclerotic heart disease of native coronary artery without angina pectoris: Secondary | ICD-10-CM

## 2015-05-04 DIAGNOSIS — I6523 Occlusion and stenosis of bilateral carotid arteries: Secondary | ICD-10-CM

## 2015-05-04 DIAGNOSIS — I712 Thoracic aortic aneurysm, without rupture, unspecified: Secondary | ICD-10-CM

## 2015-05-04 DIAGNOSIS — E785 Hyperlipidemia, unspecified: Secondary | ICD-10-CM | POA: Diagnosis not present

## 2015-05-04 DIAGNOSIS — I10 Essential (primary) hypertension: Secondary | ICD-10-CM | POA: Diagnosis not present

## 2015-05-04 MED ORDER — NITROGLYCERIN 0.4 MG SL SUBL: 0.4000 mg | SUBLINGUAL_TABLET | SUBLINGUAL | Status: DC | PRN

## 2015-05-04 NOTE — Patient Instructions (Addendum)
Medication Instructions:   Your physician recommends that you continue on your current medications as directed. Please refer to the Current Medication list given to you today.  If you need a refill on your cardiac medications before your next appointment, please call your pharmacy.  Labwork:  NONE ORDER TODAY  Testing/Procedures:  Non-Cardiac CT Angiography (CTA), is a special type of CT scan that uses a computer to produce multi-dimensional views of major blood vessels throughout the body. In CT angiography, a contrast material is injected through an IV to help visualize the blood vessels NEDS TO BE DONE IN 01/2016  Follow-Up:  Your physician wants you to follow-up in: [place] will receive a reminder letter in the mail two months in advance. If you don't receive a letter, please call our office to schedule the follow-up appointment.  Any Other Special Instructions Will Be Listed Below (If Applicable).

## 2015-05-05 ENCOUNTER — Encounter (HOSPITAL_COMMUNITY): Payer: Self-pay

## 2015-05-08 ENCOUNTER — Encounter (HOSPITAL_COMMUNITY): Payer: Self-pay

## 2015-05-10 ENCOUNTER — Encounter (HOSPITAL_COMMUNITY): Payer: Self-pay

## 2015-05-10 ENCOUNTER — Encounter (HOSPITAL_COMMUNITY)
Admission: RE | Admit: 2015-05-10 | Discharge: 2015-05-10 | Disposition: A | Discharge status: Home / Self Care | Payer: Self-pay | Source: Ambulatory Visit | Attending: Cardiovascular Disease | Admitting: Cardiovascular Disease

## 2015-05-12 ENCOUNTER — Encounter (HOSPITAL_COMMUNITY)
Admission: RE | Admit: 2015-05-12 | Discharge: 2015-05-12 | Disposition: A | Discharge status: Home / Self Care | Payer: Self-pay | Source: Ambulatory Visit | Attending: Cardiovascular Disease | Admitting: Cardiovascular Disease

## 2015-05-12 ENCOUNTER — Encounter (HOSPITAL_COMMUNITY): Payer: Self-pay

## 2015-05-15 ENCOUNTER — Encounter (HOSPITAL_COMMUNITY): Payer: Self-pay

## 2015-05-17 ENCOUNTER — Encounter (HOSPITAL_COMMUNITY): Payer: Self-pay

## 2015-05-19 ENCOUNTER — Encounter (HOSPITAL_COMMUNITY): Payer: Self-pay

## 2015-05-19 ENCOUNTER — Encounter (HOSPITAL_COMMUNITY)
Admission: RE | Admit: 2015-05-19 | Discharge: 2015-05-19 | Disposition: A | Discharge status: Home / Self Care | Payer: Self-pay | Source: Ambulatory Visit | Attending: Cardiovascular Disease | Admitting: Cardiovascular Disease

## 2015-05-22 ENCOUNTER — Encounter (HOSPITAL_COMMUNITY)
Admission: RE | Admit: 2015-05-22 | Discharge: 2015-05-22 | Disposition: A | Discharge status: Home / Self Care | Payer: Self-pay | Source: Ambulatory Visit | Attending: Cardiovascular Disease | Admitting: Cardiovascular Disease

## 2015-05-22 ENCOUNTER — Encounter (HOSPITAL_COMMUNITY): Payer: Self-pay

## 2015-05-24 ENCOUNTER — Encounter (HOSPITAL_COMMUNITY): Payer: Self-pay

## 2015-05-26 ENCOUNTER — Encounter (HOSPITAL_COMMUNITY): Payer: Self-pay

## 2015-05-29 ENCOUNTER — Encounter (HOSPITAL_COMMUNITY)
Admission: RE | Admit: 2015-05-29 | Discharge: 2015-05-29 | Disposition: A | Discharge status: Home / Self Care | Payer: Self-pay | Source: Ambulatory Visit | Attending: Cardiovascular Disease | Admitting: Cardiovascular Disease

## 2015-05-29 ENCOUNTER — Encounter (HOSPITAL_COMMUNITY): Payer: Self-pay

## 2015-05-29 DIAGNOSIS — Z951 Presence of aortocoronary bypass graft: Secondary | ICD-10-CM | POA: Insufficient documentation

## 2015-05-29 DIAGNOSIS — I213 ST elevation (STEMI) myocardial infarction of unspecified site: Secondary | ICD-10-CM | POA: Insufficient documentation

## 2015-05-31 ENCOUNTER — Encounter (HOSPITAL_COMMUNITY): Payer: Self-pay

## 2015-06-02 ENCOUNTER — Encounter (HOSPITAL_COMMUNITY): Payer: Self-pay

## 2015-06-05 ENCOUNTER — Encounter (HOSPITAL_COMMUNITY): Payer: Self-pay

## 2015-06-07 ENCOUNTER — Encounter (HOSPITAL_COMMUNITY): Payer: Self-pay

## 2015-06-09 ENCOUNTER — Encounter (HOSPITAL_COMMUNITY): Payer: Self-pay

## 2015-06-12 ENCOUNTER — Encounter (HOSPITAL_COMMUNITY): Payer: Self-pay

## 2015-06-14 ENCOUNTER — Encounter (HOSPITAL_COMMUNITY): Payer: Self-pay

## 2015-06-14 ENCOUNTER — Encounter (HOSPITAL_COMMUNITY): Admission: RE | Admit: 2015-06-14 | Payer: Self-pay | Source: Ambulatory Visit

## 2015-06-16 ENCOUNTER — Encounter (HOSPITAL_COMMUNITY): Payer: Self-pay

## 2015-06-16 ENCOUNTER — Encounter (HOSPITAL_COMMUNITY)
Admission: RE | Admit: 2015-06-16 | Discharge: 2015-06-16 | Disposition: A | Discharge status: Home / Self Care | Payer: Self-pay | Source: Ambulatory Visit | Attending: Cardiovascular Disease | Admitting: Cardiovascular Disease

## 2015-06-19 ENCOUNTER — Encounter (HOSPITAL_COMMUNITY): Payer: Self-pay

## 2015-06-21 ENCOUNTER — Encounter (HOSPITAL_COMMUNITY)
Admission: RE | Admit: 2015-06-21 | Discharge: 2015-06-21 | Disposition: A | Discharge status: Home / Self Care | Payer: Self-pay | Source: Ambulatory Visit | Attending: Cardiovascular Disease | Admitting: Cardiovascular Disease

## 2015-06-21 ENCOUNTER — Encounter (HOSPITAL_COMMUNITY): Payer: Self-pay

## 2015-06-23 ENCOUNTER — Encounter (HOSPITAL_COMMUNITY): Payer: Self-pay

## 2015-06-26 ENCOUNTER — Encounter (HOSPITAL_COMMUNITY): Payer: Self-pay

## 2015-06-26 DIAGNOSIS — I213 ST elevation (STEMI) myocardial infarction of unspecified site: Secondary | ICD-10-CM | POA: Insufficient documentation

## 2015-06-26 DIAGNOSIS — Z951 Presence of aortocoronary bypass graft: Secondary | ICD-10-CM | POA: Insufficient documentation

## 2015-06-28 ENCOUNTER — Encounter (HOSPITAL_COMMUNITY): Payer: Self-pay

## 2015-06-30 ENCOUNTER — Encounter (HOSPITAL_COMMUNITY)
Admission: RE | Admit: 2015-06-30 | Discharge: 2015-06-30 | Disposition: A | Discharge status: Home / Self Care | Payer: Self-pay | Source: Ambulatory Visit | Attending: Cardiovascular Disease | Admitting: Cardiovascular Disease

## 2015-06-30 ENCOUNTER — Encounter (HOSPITAL_COMMUNITY): Payer: Self-pay

## 2015-07-03 ENCOUNTER — Encounter (HOSPITAL_COMMUNITY)
Admission: RE | Admit: 2015-07-03 | Discharge: 2015-07-03 | Disposition: A | Discharge status: Home / Self Care | Payer: Self-pay | Source: Ambulatory Visit | Attending: Cardiovascular Disease | Admitting: Cardiovascular Disease

## 2015-07-03 ENCOUNTER — Encounter (HOSPITAL_COMMUNITY): Payer: Self-pay

## 2015-07-05 ENCOUNTER — Encounter (HOSPITAL_COMMUNITY): Payer: Self-pay

## 2015-07-05 ENCOUNTER — Encounter (HOSPITAL_COMMUNITY)
Admission: RE | Admit: 2015-07-05 | Discharge: 2015-07-05 | Disposition: A | Discharge status: Home / Self Care | Payer: Self-pay | Source: Ambulatory Visit | Attending: Cardiovascular Disease | Admitting: Cardiovascular Disease

## 2015-07-07 ENCOUNTER — Encounter (HOSPITAL_COMMUNITY): Payer: Self-pay

## 2015-07-10 ENCOUNTER — Encounter (HOSPITAL_COMMUNITY): Payer: Self-pay

## 2015-07-10 ENCOUNTER — Encounter (HOSPITAL_COMMUNITY)
Admission: RE | Admit: 2015-07-10 | Discharge: 2015-07-10 | Disposition: A | Discharge status: Home / Self Care | Payer: Self-pay | Source: Ambulatory Visit | Attending: Cardiovascular Disease | Admitting: Cardiovascular Disease

## 2015-07-12 ENCOUNTER — Encounter (HOSPITAL_COMMUNITY)
Admission: RE | Admit: 2015-07-12 | Discharge: 2015-07-12 | Disposition: A | Discharge status: Home / Self Care | Payer: Self-pay | Source: Ambulatory Visit | Attending: Cardiovascular Disease | Admitting: Cardiovascular Disease

## 2015-07-12 ENCOUNTER — Encounter (HOSPITAL_COMMUNITY): Payer: Self-pay

## 2015-07-14 ENCOUNTER — Encounter (HOSPITAL_COMMUNITY): Payer: Self-pay

## 2015-07-14 ENCOUNTER — Encounter (HOSPITAL_COMMUNITY)
Admission: RE | Admit: 2015-07-14 | Discharge: 2015-07-14 | Disposition: A | Discharge status: Home / Self Care | Payer: Self-pay | Source: Ambulatory Visit | Attending: Cardiovascular Disease | Admitting: Cardiovascular Disease

## 2015-07-17 ENCOUNTER — Encounter (HOSPITAL_COMMUNITY)
Admission: RE | Admit: 2015-07-17 | Discharge: 2015-07-17 | Disposition: A | Discharge status: Home / Self Care | Payer: Self-pay | Source: Ambulatory Visit | Attending: Cardiovascular Disease | Admitting: Cardiovascular Disease

## 2015-07-17 ENCOUNTER — Encounter (HOSPITAL_COMMUNITY): Payer: Self-pay

## 2015-07-19 ENCOUNTER — Encounter (HOSPITAL_COMMUNITY): Payer: Self-pay

## 2015-07-21 ENCOUNTER — Encounter (HOSPITAL_COMMUNITY): Payer: Self-pay

## 2015-07-21 ENCOUNTER — Encounter (HOSPITAL_COMMUNITY)
Admission: RE | Admit: 2015-07-21 | Discharge: 2015-07-21 | Disposition: A | Discharge status: Home / Self Care | Payer: Self-pay | Source: Ambulatory Visit | Attending: Cardiovascular Disease | Admitting: Cardiovascular Disease

## 2015-07-26 ENCOUNTER — Encounter (HOSPITAL_COMMUNITY)
Admission: RE | Admit: 2015-07-26 | Discharge: 2015-07-26 | Disposition: A | Discharge status: Home / Self Care | Payer: Self-pay | Source: Ambulatory Visit | Attending: Cardiovascular Disease | Admitting: Cardiovascular Disease

## 2015-07-26 ENCOUNTER — Encounter (HOSPITAL_COMMUNITY): Payer: Self-pay

## 2015-07-28 ENCOUNTER — Encounter (HOSPITAL_COMMUNITY): Payer: Self-pay

## 2015-07-28 ENCOUNTER — Encounter (HOSPITAL_COMMUNITY)
Admission: RE | Admit: 2015-07-28 | Discharge: 2015-07-28 | Disposition: A | Discharge status: Home / Self Care | Payer: Self-pay | Source: Ambulatory Visit | Attending: Cardiovascular Disease | Admitting: Cardiovascular Disease

## 2015-07-28 DIAGNOSIS — Z951 Presence of aortocoronary bypass graft: Secondary | ICD-10-CM | POA: Insufficient documentation

## 2015-07-28 DIAGNOSIS — I213 ST elevation (STEMI) myocardial infarction of unspecified site: Secondary | ICD-10-CM | POA: Insufficient documentation

## 2015-07-31 ENCOUNTER — Encounter (HOSPITAL_COMMUNITY): Payer: Self-pay

## 2015-08-02 ENCOUNTER — Encounter (HOSPITAL_COMMUNITY)
Admission: RE | Admit: 2015-08-02 | Discharge: 2015-08-02 | Disposition: A | Discharge status: Home / Self Care | Payer: Self-pay | Source: Ambulatory Visit | Attending: Cardiovascular Disease | Admitting: Cardiovascular Disease

## 2015-08-02 ENCOUNTER — Encounter (HOSPITAL_COMMUNITY): Payer: Self-pay

## 2015-08-04 ENCOUNTER — Encounter (HOSPITAL_COMMUNITY): Payer: Self-pay

## 2015-08-07 ENCOUNTER — Encounter (HOSPITAL_COMMUNITY)
Admission: RE | Admit: 2015-08-07 | Discharge: 2015-08-07 | Disposition: A | Discharge status: Home / Self Care | Payer: Self-pay | Source: Ambulatory Visit | Attending: Cardiovascular Disease | Admitting: Cardiovascular Disease

## 2015-08-07 ENCOUNTER — Encounter (HOSPITAL_COMMUNITY): Payer: Self-pay

## 2015-08-09 ENCOUNTER — Encounter (HOSPITAL_COMMUNITY): Payer: Self-pay

## 2015-08-11 ENCOUNTER — Encounter (HOSPITAL_COMMUNITY)
Admission: RE | Admit: 2015-08-11 | Discharge: 2015-08-11 | Disposition: A | Discharge status: Home / Self Care | Payer: Self-pay | Source: Ambulatory Visit | Attending: Cardiovascular Disease | Admitting: Cardiovascular Disease

## 2015-08-11 ENCOUNTER — Encounter (HOSPITAL_COMMUNITY): Payer: Self-pay

## 2015-08-14 ENCOUNTER — Encounter (HOSPITAL_COMMUNITY): Payer: Self-pay

## 2015-08-16 ENCOUNTER — Encounter (HOSPITAL_COMMUNITY): Payer: Self-pay

## 2015-08-16 ENCOUNTER — Encounter (HOSPITAL_COMMUNITY)
Admission: RE | Admit: 2015-08-16 | Discharge: 2015-08-16 | Disposition: A | Discharge status: Home / Self Care | Payer: Self-pay | Source: Ambulatory Visit | Attending: Cardiovascular Disease | Admitting: Cardiovascular Disease

## 2015-08-18 ENCOUNTER — Encounter (HOSPITAL_COMMUNITY): Payer: Self-pay

## 2015-08-18 ENCOUNTER — Encounter (HOSPITAL_COMMUNITY)
Admission: RE | Admit: 2015-08-18 | Discharge: 2015-08-18 | Disposition: A | Discharge status: Home / Self Care | Payer: Self-pay | Source: Ambulatory Visit | Attending: Cardiovascular Disease | Admitting: Cardiovascular Disease

## 2015-08-21 ENCOUNTER — Encounter (HOSPITAL_COMMUNITY): Payer: Self-pay

## 2015-08-21 ENCOUNTER — Encounter (HOSPITAL_COMMUNITY)
Admission: RE | Admit: 2015-08-21 | Discharge: 2015-08-21 | Disposition: A | Discharge status: Home / Self Care | Payer: Self-pay | Source: Ambulatory Visit | Attending: Cardiovascular Disease | Admitting: Cardiovascular Disease

## 2015-08-23 ENCOUNTER — Encounter (HOSPITAL_COMMUNITY): Payer: Self-pay

## 2015-08-23 ENCOUNTER — Telehealth: Payer: Self-pay | Admitting: *Deleted

## 2015-08-23 NOTE — Telephone Encounter (Signed)
Pt on schedule to see Dr. Angelena Form on June 30,2017. This was scheduled on Feb 2,2017 as a one year follow up. Pt then saw Cecilie Kicks, [place] in Feb and [place], [place] in March. One year follow up was planned at March visit. Appt for June 30,2017 was not cancelled.  I spoke with pt and gave him this information. I told him he could see Dr. Angelena Form on June 30 as planned or we could wait if he would like.  Pt reports he is feeling well and would like to move appt until Sept.  Appt for June 30 cancelled and appt scheduled for him to see Dr. Angelena Form on Sept 25,2017

## 2015-08-25 ENCOUNTER — Encounter (HOSPITAL_COMMUNITY): Admission: RE | Admit: 2015-08-25 | Payer: Medicare Other | Source: Ambulatory Visit

## 2015-08-25 ENCOUNTER — Encounter (HOSPITAL_COMMUNITY): Payer: Self-pay

## 2015-08-25 ENCOUNTER — Ambulatory Visit: Payer: Medicare Other | Admitting: Cardiovascular Disease

## 2015-08-28 ENCOUNTER — Encounter (HOSPITAL_COMMUNITY): Payer: Self-pay | Attending: Cardiovascular Disease

## 2015-08-28 ENCOUNTER — Encounter (HOSPITAL_COMMUNITY): Payer: Self-pay

## 2015-08-28 DIAGNOSIS — Z951 Presence of aortocoronary bypass graft: Secondary | ICD-10-CM | POA: Insufficient documentation

## 2015-08-28 DIAGNOSIS — I213 ST elevation (STEMI) myocardial infarction of unspecified site: Secondary | ICD-10-CM | POA: Insufficient documentation

## 2015-08-30 ENCOUNTER — Encounter (HOSPITAL_COMMUNITY): Payer: Self-pay

## 2015-09-01 ENCOUNTER — Encounter (HOSPITAL_COMMUNITY): Payer: Self-pay

## 2015-09-04 ENCOUNTER — Encounter (HOSPITAL_COMMUNITY): Payer: Self-pay

## 2015-09-04 ENCOUNTER — Other Ambulatory Visit: Payer: Self-pay | Admitting: Cardiovascular Disease

## 2015-09-06 ENCOUNTER — Encounter (HOSPITAL_COMMUNITY): Payer: Self-pay

## 2015-09-08 ENCOUNTER — Encounter (HOSPITAL_COMMUNITY): Payer: Self-pay

## 2015-09-11 ENCOUNTER — Encounter (HOSPITAL_COMMUNITY): Payer: Self-pay

## 2015-09-13 ENCOUNTER — Encounter (HOSPITAL_COMMUNITY): Payer: Self-pay

## 2015-09-15 ENCOUNTER — Encounter (HOSPITAL_COMMUNITY): Payer: Self-pay

## 2015-09-18 ENCOUNTER — Encounter (HOSPITAL_COMMUNITY): Payer: Self-pay

## 2015-09-20 ENCOUNTER — Encounter (HOSPITAL_COMMUNITY): Payer: Self-pay

## 2015-09-22 ENCOUNTER — Encounter (HOSPITAL_COMMUNITY): Payer: Self-pay

## 2015-09-25 ENCOUNTER — Encounter (HOSPITAL_COMMUNITY): Payer: Self-pay

## 2015-09-27 ENCOUNTER — Encounter (HOSPITAL_COMMUNITY): Payer: Self-pay

## 2015-09-27 DIAGNOSIS — Z951 Presence of aortocoronary bypass graft: Secondary | ICD-10-CM | POA: Insufficient documentation

## 2015-09-27 DIAGNOSIS — I213 ST elevation (STEMI) myocardial infarction of unspecified site: Secondary | ICD-10-CM | POA: Insufficient documentation

## 2015-09-29 ENCOUNTER — Encounter (HOSPITAL_COMMUNITY): Payer: Self-pay

## 2015-09-29 ENCOUNTER — Encounter (HOSPITAL_COMMUNITY)
Admission: RE | Admit: 2015-09-29 | Discharge: 2015-09-29 | Disposition: A | Discharge status: Home / Self Care | Payer: Self-pay | Source: Ambulatory Visit | Attending: Cardiovascular Disease | Admitting: Cardiovascular Disease

## 2015-10-02 ENCOUNTER — Encounter (HOSPITAL_COMMUNITY): Payer: Self-pay

## 2015-10-04 ENCOUNTER — Encounter (HOSPITAL_COMMUNITY): Payer: Self-pay

## 2015-10-06 ENCOUNTER — Encounter (HOSPITAL_COMMUNITY)
Admission: RE | Admit: 2015-10-06 | Discharge: 2015-10-06 | Disposition: A | Discharge status: Home / Self Care | Payer: Self-pay | Source: Ambulatory Visit | Attending: Cardiovascular Disease | Admitting: Cardiovascular Disease

## 2015-10-06 ENCOUNTER — Encounter (HOSPITAL_COMMUNITY): Payer: Self-pay

## 2015-10-09 ENCOUNTER — Encounter (HOSPITAL_COMMUNITY): Payer: Self-pay

## 2015-10-11 ENCOUNTER — Encounter (HOSPITAL_COMMUNITY)
Admission: RE | Admit: 2015-10-11 | Discharge: 2015-10-11 | Disposition: A | Discharge status: Home / Self Care | Payer: Self-pay | Source: Ambulatory Visit | Attending: Cardiovascular Disease | Admitting: Cardiovascular Disease

## 2015-10-11 ENCOUNTER — Encounter (HOSPITAL_COMMUNITY): Payer: Self-pay

## 2015-10-13 ENCOUNTER — Encounter (HOSPITAL_COMMUNITY)
Admission: RE | Admit: 2015-10-13 | Discharge: 2015-10-13 | Disposition: A | Discharge status: Home / Self Care | Payer: Self-pay | Source: Ambulatory Visit | Attending: Cardiovascular Disease | Admitting: Cardiovascular Disease

## 2015-10-13 ENCOUNTER — Encounter (HOSPITAL_COMMUNITY): Payer: Self-pay

## 2015-10-16 ENCOUNTER — Encounter (HOSPITAL_COMMUNITY): Payer: Self-pay

## 2015-10-17 ENCOUNTER — Other Ambulatory Visit (INDEPENDENT_AMBULATORY_CARE_PROVIDER_SITE_OTHER): Payer: Medicare Other | Admitting: *Deleted

## 2015-10-17 ENCOUNTER — Other Ambulatory Visit: Payer: Self-pay | Admitting: *Deleted

## 2015-10-17 DIAGNOSIS — I712 Thoracic aortic aneurysm, without rupture, unspecified: Secondary | ICD-10-CM

## 2015-10-17 LAB — BASIC METABOLIC PANEL
BUN: 22 mg/dL (ref 7–25)
CO2: 24 mmol/L (ref 20–31)
CREATININE: 0.99 mg/dL (ref 0.70–1.18)
Calcium: 9.7 mg/dL (ref 8.6–10.3)
Chloride: 105 mmol/L (ref 98–110)
Glucose, Bld: 98 mg/dL (ref 65–99)
Potassium: 4.8 mmol/L (ref 3.5–5.3)
Sodium: 138 mmol/L (ref 135–146)

## 2015-10-18 ENCOUNTER — Encounter (HOSPITAL_COMMUNITY): Payer: Self-pay

## 2015-10-18 ENCOUNTER — Encounter (HOSPITAL_COMMUNITY)
Admission: RE | Admit: 2015-10-18 | Discharge: 2015-10-18 | Disposition: A | Discharge status: Home / Self Care | Payer: Self-pay | Source: Ambulatory Visit | Attending: Cardiovascular Disease | Admitting: Cardiovascular Disease

## 2015-10-20 ENCOUNTER — Encounter (HOSPITAL_COMMUNITY): Payer: Self-pay

## 2015-10-23 ENCOUNTER — Encounter (HOSPITAL_COMMUNITY): Payer: Self-pay

## 2015-10-24 ENCOUNTER — Ambulatory Visit (INDEPENDENT_AMBULATORY_CARE_PROVIDER_SITE_OTHER)
Admission: RE | Admit: 2015-10-24 | Discharge: 2015-10-24 | Disposition: A | Discharge status: Home / Self Care | Payer: Medicare Other | Source: Ambulatory Visit | Attending: Physician Assistant | Admitting: Physician Assistant

## 2015-10-24 ENCOUNTER — Telehealth: Payer: Self-pay | Admitting: *Deleted

## 2015-10-24 DIAGNOSIS — I712 Thoracic aortic aneurysm, without rupture, unspecified: Secondary | ICD-10-CM

## 2015-10-24 MED ORDER — IOPAMIDOL (ISOVUE-370) INJECTION 76%
100.0000 mL | Freq: Once | INTRAVENOUS | Status: AC | PRN
  Administered 2015-10-24: 100 mL via INTRAVENOUS

## 2015-10-24 NOTE — Telephone Encounter (Signed)
pt notified of CTA results and findings by phone with verbal understanding. Pt advised to f/u w/PCP in regards to gallbladder wall distention, pt agreeable to plan of care. I will fax results to PCP as well. Pt advised keep Dr. Angelena Form appt 9/25 .

## 2015-10-25 ENCOUNTER — Encounter (HOSPITAL_COMMUNITY): Payer: Self-pay

## 2015-10-25 ENCOUNTER — Encounter (HOSPITAL_COMMUNITY)
Admission: RE | Admit: 2015-10-25 | Discharge: 2015-10-25 | Disposition: A | Discharge status: Home / Self Care | Payer: Self-pay | Source: Ambulatory Visit | Attending: Cardiovascular Disease | Admitting: Cardiovascular Disease

## 2015-10-27 ENCOUNTER — Encounter (HOSPITAL_COMMUNITY)
Admission: RE | Admit: 2015-10-27 | Discharge: 2015-10-27 | Disposition: A | Discharge status: Home / Self Care | Payer: Self-pay | Source: Ambulatory Visit | Attending: Cardiovascular Disease | Admitting: Cardiovascular Disease

## 2015-10-27 DIAGNOSIS — Z951 Presence of aortocoronary bypass graft: Secondary | ICD-10-CM | POA: Insufficient documentation

## 2015-10-27 DIAGNOSIS — I213 ST elevation (STEMI) myocardial infarction of unspecified site: Secondary | ICD-10-CM | POA: Insufficient documentation

## 2015-10-31 ENCOUNTER — Encounter: Payer: Self-pay | Admitting: Cardiovascular Disease

## 2015-11-08 ENCOUNTER — Encounter (HOSPITAL_COMMUNITY)
Admission: RE | Admit: 2015-11-08 | Discharge: 2015-11-08 | Disposition: A | Discharge status: Home / Self Care | Payer: Self-pay | Source: Ambulatory Visit | Attending: Cardiovascular Disease | Admitting: Cardiovascular Disease

## 2015-11-20 ENCOUNTER — Ambulatory Visit (INDEPENDENT_AMBULATORY_CARE_PROVIDER_SITE_OTHER): Payer: Medicare Other | Admitting: Cardiovascular Disease

## 2015-11-20 ENCOUNTER — Encounter: Payer: Self-pay | Admitting: Cardiovascular Disease

## 2015-11-20 ENCOUNTER — Encounter (HOSPITAL_COMMUNITY)
Admission: RE | Admit: 2015-11-20 | Discharge: 2015-11-20 | Disposition: A | Discharge status: Home / Self Care | Payer: Self-pay | Source: Ambulatory Visit | Attending: Cardiovascular Disease | Admitting: Cardiovascular Disease

## 2015-11-20 ENCOUNTER — Ambulatory Visit: Payer: Medicare Other | Admitting: Cardiovascular Disease

## 2015-11-20 VITALS — BP 122/76 | HR 64 | Ht 70.0 in | Wt 213.4 lb

## 2015-11-20 DIAGNOSIS — I6523 Occlusion and stenosis of bilateral carotid arteries: Secondary | ICD-10-CM | POA: Diagnosis not present

## 2015-11-20 DIAGNOSIS — I712 Thoracic aortic aneurysm, without rupture, unspecified: Secondary | ICD-10-CM

## 2015-11-20 DIAGNOSIS — I10 Essential (primary) hypertension: Secondary | ICD-10-CM | POA: Diagnosis not present

## 2015-11-20 DIAGNOSIS — I2510 Atherosclerotic heart disease of native coronary artery without angina pectoris: Secondary | ICD-10-CM | POA: Diagnosis not present

## 2015-11-20 DIAGNOSIS — E785 Hyperlipidemia, unspecified: Secondary | ICD-10-CM

## 2015-11-20 NOTE — Progress Notes (Signed)
Chief Complaint  Patient presents with  . Follow-up     History of Present Illness: 70 yo WM with history of CAD, HLD, HTN, thoracic aortic aneurysm, carotid artery disease who is here today for cardiac follow up. He was admitted to [facility] 01/01/10 with an acute inferior STEMI. Emergent cardiac cath showed severe three vessel CAD. He underwent 3V CABG later that day with Dr. Cyndia Bent performing. (LIMA to LAD, SVG to OM, SVG to RCA). He was admitted to [facility] August 2013 for abdominal pain and felt to have ischemic colitis and his beta blocker dose was reduced. He also had some bradycardia. He was seen by Truitt Merle, NP in January 2014 and was doing well but c/o skipped beats. Event monitor with PVCs, NSR. Stress myoview without ischemia April 2014. BP much better on Lisinopril increased dose. He was seen by Cecilie Kicks, NP February 2017 with chest pain. Stress myoview February 2017 was low risk.   He is here today for follow up. No chest pain, SOB. He feels well overall. He has been exercising in cardiac rehab and doing well.   Primary Care Physician: Jani Gravel, MD  Past Medical History:  Diagnosis Date  . Anxiety   . Arthritis   . Asthma    exercise induced  . Back pain, chronic    gets injections in back  . CAD (coronary artery disease)    a.  prior inferior MI; s/p 3V CABG 01/01/10 (LIMA to LAD, SVG to OM, SVG to RCA- Dr.Bartle);  b. Last Myoview (05/2012):  Normal; EF 70%  . Carotid stenosis    a. Carotid US (01/2011):  Bilateral ICA 0-39% (repeat in 1 year).;  b.  Carotid US (123XX123): RICA 123456; LICA 123456. Follow up 1 year;  c.  Carotid US (2/16):  R 40-59%; L 1-39%; FU 1 year.  Marland Kitchen Dysrhythmia    PVC's  . GERD (gastroesophageal reflux disease)   . Gout   . History of kidney stones    no problems with stones  . Hx of echocardiogram    Echo (01/2010):  EF 55-60%, ? Apical HK, mild BAE, atrial septal aneurysm.  Marland Kitchen Hyperlipidemia   . Hypertension   . Ischemic  colitis ([place])    noted on CT back in August 2013 possible related to hypotension  . Pneumonia    x 2  . PUD (peptic ulcer disease) 1974  . PVC's (premature ventricular contractions)    Event monitor in 04/2012: NSR, PVCs.  . ST elevation myocardial infarction (STEMI) of inferior wall ([place]) 2011   s/p CABG 2011  . Thoracic aortic aneurysm ([place])     stable on CT 01/13/10;  Chest/abdominal CTA (11/2011):  3.9 x 3.6 cm prox descending thoracic aorta (stable)    Past Surgical History:  Procedure Laterality Date  . CARDIAC CATHETERIZATION    . CHEST TUBE INSERTION  1992   fx lt ribs-collapsed lung  . CORONARY ARTERY BYPASS GRAFT  11/11   X 3 VESSELS  . cyst removed from back of neck    . cyst removel on right buttocks - 2014    . EAR CYST EXCISION Left 09/02/2012   Procedure: EXCISION SEBACEOUS CYST LEFT POSTERIOR LEG;  Surgeon: Pedro Earls, MD;  Location: [facility];  Service: General;  Laterality: Left;  . Excision of deep left neck mass. SURGEON:  Leonides Sake. Lucia Gaskins, M.D.  [place].Melena  . EYE SURGERY  both cararacts  . HERNIA REPAIR    .  INGUINAL HERNIA REPAIR Right 12/02/2012   Procedure: OPEN RIGHT INGUINAL HERINA;  Surgeon: Pedro Earls, MD;  Location[facility];  Service: General;  Laterality: Right;  . INGUINAL HERNIA REPAIR Left 11/02/2014   Procedure: OPEN LEFT INGUINAL HERNIA REPAIR WITH MESH;  Surgeon: Johnathan Hausen, MD;  [facility];  Service: General;  Laterality: Left;  . INSERTION OF MESH Right 12/02/2012   Procedure: INSERTION OF MESH;  Surgeon: Pedro Earls, MD;  [facility];  Service: General;  Laterality: Right;  . LUMBAR LAMINECTOMY/DECOMPRESSION MICRODISCECTOMY N/A 01/26/2015   Procedure: Lumbar 5-sacrum 1 decompression;  Surgeon: Phylliss Bob, MD;  Location: [facility];  Service: Orthopedics;  Laterality: N/A;  . TONSILLECTOMY      Current Outpatient Prescriptions  Medication Sig Dispense Refill  . allopurinol (ZYLOPRIM) 300 MG tablet Take  300 mg by mouth every morning.     Marland Kitchen aspirin 81 MG tablet Take 81 mg by mouth daily.    . Cholecalciferol (VITAMIN D3) 3000 UNITS TABS Take 3,000 Units by mouth daily.     . clotrimazole (LOTRIMIN) 1 % cream Apply 1 application topically 2 (two) times daily as needed (FOR FOOT RASH).     Marland Kitchen dexlansoprazole (DEXILANT) 60 MG capsule Take 60 mg by mouth every morning.     . hydrocortisone cream 1 % Apply 1 application topically 2 (two) times daily.    Marland Kitchen lisinopril (PRINIVIL,ZESTRIL) 20 MG tablet Take 20 mg by mouth at bedtime.    Marland Kitchen lisinopril (PRINIVIL,ZESTRIL) 40 MG tablet Take 40 mg by mouth every morning.    . metoprolol (LOPRESSOR) 50 MG tablet Take 1 tablet (50 mg total) by mouth 2 (two) times daily. 60 tablet 11  . Multiple Vitamin (MULTIVITAMIN WITH MINERALS) TABS tablet Take 1 tablet by mouth every morning.     . neomycin-bacitracin-polymyxin (NEOSPORIN) [phone] ointment Apply 1 application topically 2 (two) times daily as needed (FOR FOOT IRRITATION).     Marland Kitchen nitroGLYCERIN (NITROSTAT) 0.4 MG SL tablet Place 1 tablet (0.4 mg total) under the tongue every 5 (five) minutes as needed for chest pain. 25 tablet 3  . simvastatin (ZOCOR) 40 MG tablet Take 40 mg by mouth every morning.     . vitamin B-12 (CYANOCOBALAMIN) 500 MCG tablet Take 500 mcg by mouth daily.    . vitamin C (ASCORBIC ACID) 500 MG tablet Take 1,000 mg by mouth every morning.      No current facility-administered medications for this visit.     Allergies  Allergen Reactions  . Levaquin [Levofloxacin]     INSOMNIA    Social History   Social History  . Marital status: Widowed    Spouse name: N/A  . Number of children: N/A  . Years of education: N/A   Occupational History  . Not on file.   Social History Main Topics  . Smoking status: Former Smoker    Quit date: 02/25/1974  . Smokeless tobacco: Never Used  . Alcohol use Yes     Comment: rarely  . Drug use: No  . Sexual activity: Not on file   Other Topics  Concern  . Not on file   Social History Narrative  . No narrative on file    Family History  Problem Relation Age of Onset  . Heart failure Mother   . Pneumonia Father   . Cancer Sister     lung  . Cancer Maternal Grandmother     pancreatic  . Cancer Sister  esophagus    Review of Systems:  As stated in the HPI and otherwise negative.   BP 122/76 (BP Location: Right Arm, Patient Position: Sitting, Cuff Size: Normal)   Pulse 64   Ht 5\' 10"  (1.778 m)   Wt 96.8 kg (213 lb 6.4 oz)   BMI 30.62 kg/m   Physical Examination: General: Well developed, well nourished, NAD  HEENT: OP clear, mucus membranes moist  SKIN: warm, dry. No rashes. Neuro: No focal deficits  Musculoskeletal: Muscle strength 5/5 all ext  Psychiatric: Mood and affect normal  Neck: No JVD, no carotid bruits, no thyromegaly, no lymphadenopathy.  Lungs:Clear bilaterally, no wheezes, rhonci, crackles Cardiovascular: Regular rate and rhythm. No murmurs, gallops or rubs. Abdomen:Soft. Bowel sounds present. Non-tender.  Extremities: No lower extremity edema. Pulses are 2 + in the bilateral DP/PT.  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 The wall of the aneurysm is calcified.  No evidence of aortic dissection or transection. Postoperative changes from CABG.  Innominate artery, right subclavian artery, right common carotid artery, left common carotid artery, left subclavian artery, and bilateral vertebral arteries are patent within the confines of the examination.  Innumerable collateral venous structures in the right axilla and right chest opacified without visible venous stenosis. This may simply represent elevated right heart pressures.  Stable sub cm nodules again, compatible with benign disease. Triangular opacity at the lateral left base on image 59  likely represents volume loss.  No pneumothorax or pleural effusion.  Chronic bilateral rib deformities.  Small gastrohepatic ligament nodes are visualized.  Review of the MIP images confirms the above findings.  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  EKG:  EKG is not  ordered today. The ekg ordered today demonstrates   Recent Labs: 01/12/2015: ALT 27; Hemoglobin 15.9; Platelets 159 10/17/2015: BUN 22; Creat 0.99; Potassium 4.8; Sodium 138   Lipid Panel No results found for: CHOL, TRIG, HDL, CHOLHDL, VLDL, LDLCALC, LDLDIRECT   Wt Readings from Last 3 Encounters:  11/20/15 96.8 kg (213 lb 6.4 oz)  05/04/15 91.5 kg (201 lb 12.8 oz)  04/19/15 92.5 kg (204 lb)     Other studies Reviewed: Additional studies/ records that were reviewed today include: . Review of the above records demonstrates:    Assessment and Plan:   1. CAD: Stable. Stress myoview February 2017 without ischemia.  Continue beta blocker, ASA, statin, Ace-inh.   2. HTN: BP controlled. No changes   3. HLD: Continue statin. Lipids well controlled in primary care.   4. Thoracic aortic aneurysm: CTA chest August 2017 with stable 3.9 proximal descending thoracic aorta. Repeat December 2017  5. Palpitations: PVCs controlled on higher dose of Lopressor.   6. Carotid artery disease: Moderate carotid disease. Repeat February 2018   Current medicines are reviewed at length with the patient today.  The patient does not have concerns regarding medicines.  The following changes have been made:  no change  Labs/ tests ordered today include:  No orders of the defined types were placed in this encounter.   Disposition:   FU with me in 1 year  Signed, Lauree Chandler, MD 11/20/2015 9:55 AM    [facility], [place], [place]  [address] Phone: [phone]; Fax: [phone]

## 2015-11-20 NOTE — Patient Instructions (Signed)
Medication Instructions:  Your physician recommends that you continue on your current medications as directed. Please refer to the Current Medication list given to you today.   Labwork: none  Testing/Procedures:  Your physician has requested that you have a carotid duplex. This test is an ultrasound of the carotid arteries in your neck. It looks at blood flow through these arteries that supply the brain with blood. Allow one hour for this exam. There are no restrictions or special instructions. To be done in February      Follow-Up: Your physician wants you to follow-up in: 12 months.  You will receive a reminder letter in the mail two months in advance. If you don't receive a letter, please call our office to schedule the follow-up appointment.   Any Other Special Instructions Will Be Listed Below (If Applicable).     If you need a refill on your cardiac medications before your next appointment, please call your pharmacy.

## 2015-11-22 ENCOUNTER — Encounter (HOSPITAL_COMMUNITY)
Admission: RE | Admit: 2015-11-22 | Discharge: 2015-11-22 | Disposition: A | Discharge status: Home / Self Care | Payer: Self-pay | Source: Ambulatory Visit | Attending: Cardiovascular Disease | Admitting: Cardiovascular Disease

## 2015-12-01 ENCOUNTER — Encounter (HOSPITAL_COMMUNITY)
Admission: RE | Admit: 2015-12-01 | Discharge: 2015-12-01 | Disposition: A | Discharge status: Home / Self Care | Payer: Self-pay | Source: Ambulatory Visit | Attending: Cardiovascular Disease | Admitting: Cardiovascular Disease

## 2015-12-01 DIAGNOSIS — Z951 Presence of aortocoronary bypass graft: Secondary | ICD-10-CM | POA: Insufficient documentation

## 2015-12-01 DIAGNOSIS — I213 ST elevation (STEMI) myocardial infarction of unspecified site: Secondary | ICD-10-CM | POA: Insufficient documentation

## 2015-12-06 ENCOUNTER — Encounter (HOSPITAL_COMMUNITY)
Admission: RE | Admit: 2015-12-06 | Discharge: 2015-12-06 | Disposition: A | Discharge status: Home / Self Care | Payer: Self-pay | Source: Ambulatory Visit | Attending: Cardiovascular Disease | Admitting: Cardiovascular Disease

## 2015-12-15 ENCOUNTER — Encounter (HOSPITAL_COMMUNITY)
Admission: RE | Admit: 2015-12-15 | Discharge: 2015-12-15 | Disposition: A | Discharge status: Home / Self Care | Payer: Self-pay | Source: Ambulatory Visit | Attending: Cardiovascular Disease | Admitting: Cardiovascular Disease

## 2015-12-20 ENCOUNTER — Encounter (HOSPITAL_COMMUNITY)
Admission: RE | Admit: 2015-12-20 | Discharge: 2015-12-20 | Disposition: A | Discharge status: Home / Self Care | Payer: Self-pay | Source: Ambulatory Visit | Attending: Cardiovascular Disease | Admitting: Cardiovascular Disease

## 2015-12-22 ENCOUNTER — Encounter (HOSPITAL_COMMUNITY)
Admission: RE | Admit: 2015-12-22 | Discharge: 2015-12-22 | Disposition: A | Discharge status: Home / Self Care | Payer: Self-pay | Source: Ambulatory Visit | Attending: Cardiovascular Disease | Admitting: Cardiovascular Disease

## 2015-12-25 ENCOUNTER — Encounter (HOSPITAL_COMMUNITY)
Admission: RE | Admit: 2015-12-25 | Discharge: 2015-12-25 | Disposition: A | Discharge status: Home / Self Care | Payer: Self-pay | Source: Ambulatory Visit | Attending: Cardiovascular Disease | Admitting: Cardiovascular Disease

## 2015-12-27 ENCOUNTER — Encounter (HOSPITAL_COMMUNITY): Payer: Self-pay

## 2015-12-27 DIAGNOSIS — Z951 Presence of aortocoronary bypass graft: Secondary | ICD-10-CM | POA: Insufficient documentation

## 2015-12-27 DIAGNOSIS — I213 ST elevation (STEMI) myocardial infarction of unspecified site: Secondary | ICD-10-CM | POA: Insufficient documentation

## 2015-12-29 ENCOUNTER — Encounter (HOSPITAL_COMMUNITY)
Admission: RE | Admit: 2015-12-29 | Discharge: 2015-12-29 | Disposition: A | Discharge status: Home / Self Care | Payer: Self-pay | Source: Ambulatory Visit | Attending: Cardiovascular Disease | Admitting: Cardiovascular Disease

## 2016-01-01 ENCOUNTER — Encounter (HOSPITAL_COMMUNITY)
Admission: RE | Admit: 2016-01-01 | Discharge: 2016-01-01 | Disposition: A | Discharge status: Home / Self Care | Payer: Self-pay | Source: Ambulatory Visit | Attending: Cardiovascular Disease | Admitting: Cardiovascular Disease

## 2016-01-03 ENCOUNTER — Encounter (HOSPITAL_COMMUNITY)
Admission: RE | Admit: 2016-01-03 | Discharge: 2016-01-03 | Disposition: A | Discharge status: Home / Self Care | Payer: Self-pay | Source: Ambulatory Visit | Attending: Cardiovascular Disease | Admitting: Cardiovascular Disease

## 2016-01-08 ENCOUNTER — Encounter (HOSPITAL_COMMUNITY)
Admission: RE | Admit: 2016-01-08 | Discharge: 2016-01-08 | Disposition: A | Discharge status: Home / Self Care | Payer: Self-pay | Source: Ambulatory Visit | Attending: Cardiovascular Disease | Admitting: Cardiovascular Disease

## 2016-01-10 ENCOUNTER — Encounter (HOSPITAL_COMMUNITY)
Admission: RE | Admit: 2016-01-10 | Discharge: 2016-01-10 | Disposition: A | Discharge status: Home / Self Care | Payer: Self-pay | Source: Ambulatory Visit | Attending: Cardiovascular Disease | Admitting: Cardiovascular Disease

## 2016-01-15 ENCOUNTER — Ambulatory Visit (HOSPITAL_COMMUNITY): Payer: Medicare Other

## 2016-01-26 ENCOUNTER — Encounter (HOSPITAL_COMMUNITY)
Admission: RE | Admit: 2016-01-26 | Discharge: 2016-01-26 | Disposition: A | Discharge status: Home / Self Care | Payer: Self-pay | Source: Ambulatory Visit | Attending: Cardiovascular Disease | Admitting: Cardiovascular Disease

## 2016-01-26 DIAGNOSIS — I2510 Atherosclerotic heart disease of native coronary artery without angina pectoris: Secondary | ICD-10-CM | POA: Insufficient documentation

## 2016-01-29 ENCOUNTER — Encounter (HOSPITAL_COMMUNITY): Payer: Self-pay

## 2016-01-31 ENCOUNTER — Encounter (HOSPITAL_COMMUNITY): Payer: Self-pay

## 2016-02-02 ENCOUNTER — Encounter (HOSPITAL_COMMUNITY): Payer: Self-pay

## 2016-02-05 ENCOUNTER — Encounter (HOSPITAL_COMMUNITY)
Admission: RE | Admit: 2016-02-05 | Discharge: 2016-02-05 | Disposition: A | Discharge status: Home / Self Care | Payer: Self-pay | Source: Ambulatory Visit | Attending: Cardiovascular Disease | Admitting: Cardiovascular Disease

## 2016-02-07 ENCOUNTER — Encounter (HOSPITAL_COMMUNITY)
Admission: RE | Admit: 2016-02-07 | Discharge: 2016-02-07 | Disposition: A | Discharge status: Home / Self Care | Payer: Self-pay | Source: Ambulatory Visit | Attending: Cardiovascular Disease | Admitting: Cardiovascular Disease

## 2016-02-09 ENCOUNTER — Encounter (HOSPITAL_COMMUNITY): Payer: Self-pay

## 2016-02-12 ENCOUNTER — Encounter (HOSPITAL_COMMUNITY)
Admission: RE | Admit: 2016-02-12 | Discharge: 2016-02-12 | Disposition: A | Discharge status: Home / Self Care | Payer: Self-pay | Source: Ambulatory Visit | Attending: Cardiovascular Disease | Admitting: Cardiovascular Disease

## 2016-02-14 ENCOUNTER — Encounter (HOSPITAL_COMMUNITY)
Admission: RE | Admit: 2016-02-14 | Discharge: 2016-02-14 | Disposition: A | Discharge status: Home / Self Care | Payer: Self-pay | Source: Ambulatory Visit | Attending: Cardiovascular Disease | Admitting: Cardiovascular Disease

## 2016-02-16 ENCOUNTER — Encounter (HOSPITAL_COMMUNITY): Payer: Self-pay

## 2016-02-21 ENCOUNTER — Encounter (HOSPITAL_COMMUNITY): Payer: Self-pay

## 2016-02-23 ENCOUNTER — Encounter (HOSPITAL_COMMUNITY): Payer: Self-pay

## 2016-02-28 ENCOUNTER — Encounter (HOSPITAL_COMMUNITY): Payer: Self-pay

## 2016-02-28 DIAGNOSIS — I2510 Atherosclerotic heart disease of native coronary artery without angina pectoris: Secondary | ICD-10-CM | POA: Insufficient documentation

## 2016-03-01 ENCOUNTER — Encounter (HOSPITAL_COMMUNITY): Payer: Self-pay

## 2016-03-04 ENCOUNTER — Encounter (HOSPITAL_COMMUNITY): Payer: Self-pay

## 2016-03-06 ENCOUNTER — Encounter (HOSPITAL_COMMUNITY): Payer: Self-pay

## 2016-03-08 ENCOUNTER — Encounter (HOSPITAL_COMMUNITY): Payer: Self-pay

## 2016-03-11 ENCOUNTER — Encounter (HOSPITAL_COMMUNITY): Payer: Self-pay

## 2016-03-13 ENCOUNTER — Encounter (HOSPITAL_COMMUNITY): Payer: Self-pay

## 2016-03-15 ENCOUNTER — Encounter (HOSPITAL_COMMUNITY): Payer: Self-pay

## 2016-03-18 ENCOUNTER — Encounter (HOSPITAL_COMMUNITY): Payer: Self-pay

## 2016-03-20 ENCOUNTER — Encounter (HOSPITAL_COMMUNITY)
Admission: RE | Admit: 2016-03-20 | Discharge: 2016-03-20 | Disposition: A | Discharge status: Home / Self Care | Payer: Self-pay | Source: Ambulatory Visit | Attending: Cardiovascular Disease | Admitting: Cardiovascular Disease

## 2016-03-22 ENCOUNTER — Encounter (HOSPITAL_COMMUNITY): Payer: Self-pay

## 2016-03-25 ENCOUNTER — Encounter (HOSPITAL_COMMUNITY): Payer: Self-pay

## 2016-03-26 ENCOUNTER — Other Ambulatory Visit: Payer: Self-pay | Admitting: Cardiovascular Disease

## 2016-03-27 ENCOUNTER — Encounter (HOSPITAL_COMMUNITY): Payer: Self-pay

## 2016-03-29 ENCOUNTER — Telehealth: Payer: Self-pay | Admitting: Cardiovascular Disease

## 2016-03-29 ENCOUNTER — Encounter (HOSPITAL_COMMUNITY)
Admission: RE | Admit: 2016-03-29 | Discharge: 2016-03-29 | Disposition: A | Discharge status: Home / Self Care | Payer: Self-pay | Source: Ambulatory Visit | Attending: Cardiovascular Disease | Admitting: Cardiovascular Disease

## 2016-03-29 DIAGNOSIS — I2510 Atherosclerotic heart disease of native coronary artery without angina pectoris: Secondary | ICD-10-CM | POA: Insufficient documentation

## 2016-03-29 NOTE — Telephone Encounter (Signed)
I would let him know that I do manage venous issues. I would have him speak to his primary care physician about referral to a vein specialist. Samuel Coleman

## 2016-03-29 NOTE — Telephone Encounter (Signed)
Mr. Wilhelmi is asking that you give him a call , he has a question regarding his legs . Please call

## 2016-03-29 NOTE — Telephone Encounter (Signed)
I spoke with him and told him Dr. Angelena Form does not manage venous issues.  I asked him to follow up with primary care or wound center to see if referral to vein specialist is indicated.

## 2016-03-29 NOTE — Telephone Encounter (Signed)
I spoke with pt. He reports he had biopsy done on left foot on January 4th. This did not heal and he is being treated by wound center. He reports arterial studies have been done and these are OK.  Told he had venous insufficiency.  Currently wearing tight wrap to leg and elevating it 30 minutes three times daily.  Pt is asking if Dr. Angelena Form deals with venous issues or has any other recommendations.

## 2016-04-01 ENCOUNTER — Encounter (HOSPITAL_COMMUNITY): Payer: Self-pay

## 2016-04-03 ENCOUNTER — Encounter (HOSPITAL_COMMUNITY): Payer: Self-pay

## 2016-04-04 ENCOUNTER — Telehealth: Payer: Self-pay | Admitting: Cardiovascular Disease

## 2016-04-04 NOTE — Telephone Encounter (Signed)
I spoke with pt. He reports flu like symptoms and possible bronchitis last weekend.  Treated with Tamiflu and z-pack.  These symptoms have gotten better. He reports yesterday around 10 AM he had episode of chest pain, shortness of breath and feeling sweaty.  Lasted 1-2 hours.  Saw primary care today. EKG and chest X-ray OK. Told to follow up with Dr. Angelena Form.  Today he is not having any chest pain, shortness of breath or flu like symptoms.  He is requesting office visit.  I scheduled him to see Richardson Dopp, PA on 04/05/16 at 10:30

## 2016-04-04 NOTE — Telephone Encounter (Signed)
New Message:   Please call,says he had the flu and now he is having some issues of shortness of breath.Pt not having shortness of breath at this time. Pt says he did experience some chest pains also,no chest pains at this time.Pt saw his primary care doctor today,his ekg was good.His primary doctor told him to call Dr Angelena Form.

## 2016-04-05 ENCOUNTER — Encounter: Payer: Self-pay | Admitting: Physician Assistant

## 2016-04-05 ENCOUNTER — Ambulatory Visit (INDEPENDENT_AMBULATORY_CARE_PROVIDER_SITE_OTHER): Payer: Medicare Other | Admitting: Physician Assistant

## 2016-04-05 ENCOUNTER — Telehealth: Payer: Self-pay | Admitting: *Deleted

## 2016-04-05 ENCOUNTER — Encounter (HOSPITAL_COMMUNITY): Payer: Self-pay

## 2016-04-05 VITALS — BP 140/60 | HR 88 | Ht 70.0 in | Wt 206.8 lb

## 2016-04-05 DIAGNOSIS — I2510 Atherosclerotic heart disease of native coronary artery without angina pectoris: Secondary | ICD-10-CM | POA: Diagnosis not present

## 2016-04-05 DIAGNOSIS — I779 Disorder of arteries and arterioles, unspecified: Secondary | ICD-10-CM

## 2016-04-05 DIAGNOSIS — I712 Thoracic aortic aneurysm, without rupture, unspecified: Secondary | ICD-10-CM

## 2016-04-05 DIAGNOSIS — I739 Peripheral vascular disease, unspecified: Secondary | ICD-10-CM

## 2016-04-05 DIAGNOSIS — R0789 Other chest pain: Secondary | ICD-10-CM

## 2016-04-05 DIAGNOSIS — E785 Hyperlipidemia, unspecified: Secondary | ICD-10-CM | POA: Diagnosis not present

## 2016-04-05 DIAGNOSIS — I10 Essential (primary) hypertension: Secondary | ICD-10-CM

## 2016-04-05 LAB — TROPONIN T

## 2016-04-05 NOTE — Progress Notes (Signed)
Cardiology Office Note:    Date:  04/05/2016   ID:  Samuel Coleman, DOB 03/16/1945, MRN [id number]  PCP:  Jani Gravel, MD  Cardiologist:  Dr. Lauree Chandler   Electrophysiologist:  n/a  Referring MD: Jani Gravel, MD   Chief Complaint  Patient presents with  . Chest Pain    History of Present Illness:    Samuel Coleman is a 71 y.o. male with a hx of CAD, s/p inferior STEMI and subsequent CABG (L-LAD, S-OM, S-RCA) in 12/2009, HTN, HL, prior ischemic colitis, thoracic aortic aneurysm, GERD.  Myoview in 2/17 was low risk and neg for ischemia.  He was last seen by Dr. Angelena Form 9/17.  Patient called in yesterday with complaints of recent flulike symptoms and possible bronchitis treated with Tamiflu and azithromycin. He had an episode of chest discomfort, dyspnea and diaphoresis lasting 1-2 hours. He was seen by primary care and follow-up with cardiology was recommended.  He is here alone today.  He has been followed by the wound center at [facility].  He had a recent biopsy of his left foot. He is wearing a hard shoe. He developed flulike symptoms last weekend. As noted, his primary care doctor placed him on antiviral as well as antibiotic therapy. He was seen there earlier this week and a chest x-ray was reportedly normal. Several days ago, he had right-sided chest discomfort. This went on for about 24 hours. It was intermittent over the next couple of days and he is now pain-free. He did note some discomfort with activity. This was not exactly like his previous angina but there were some similarities.  He has had episodes of diaphoresis. He denies pleuritic symptoms. He denies any radiating symptoms or nausea. He has been short of breath with activity since he became sick. This is improved. He denies syncope.  Prior CV studies:   The following studies were reviewed today:  Chest CTA 8/17 IMPRESSION: Stable proximal descending thoracic aortic saccular aneurysm with calcification  medially. Maximum transverse diameter of this aneurysm measures 3.9 x 3.6 cm. No new aneurysm. No thoracic aortic dissection evident. No pulmonary embolus. Atherosclerotic calcification noted at multiple sites. Patient is status post coronary artery bypass grafting. Several 3 mm nodular opacities in the lungs, not felt to be changed. No lung edema or consolidation. No adenopathy. Gallbladder appears slightly distended without wall thickening. Significance of this finding is uncertain.  Carotid US 04/10/15 Stable 40-59% RICA stenosis. Stable 123456 LICA stenosis. F/U 1 year.   Myoview 04/19/15 EF 58%, hypertensive BP response, diaph attenuation, no ischemia; Low Risk   Chest CTA 12/15 IMPRESSION: Stable saccular aneurysm in the proximal descending aorta with a maximal diameter of 3.9 cm.   Echo (01/2010):   EF 55-60%, ? Apical HK, mild BAE, atrial septal aneurysm.     Carotid US (03/2013):   RICA 123456; LICA 123456 (repeat in 1 year).     Chest/abdominal CTA (11/2011):   3.9 x 3.6 cm prox descending thoracic aorta (stable).     Last Myoview (05/2012):  Normal; EF 70%.     Event monitor in 04/2012:  NSR, PVCs.     Past Medical History:  Diagnosis Date  . Anxiety   . Arthritis   . Asthma    exercise induced  . Back pain, chronic    gets injections in back  . CAD (coronary artery disease)    a.  prior inferior MI; s/p 3V CABG 01/01/10 (LIMA to LAD, SVG to OM, SVG to  RCA- Dr.Bartle);  b. Last Myoview (05/2012):  Normal; EF 70%  . Carotid stenosis    a. Carotid US (01/2011):  Bilateral ICA 0-39% (repeat in 1 year).;  b.  Carotid US (123XX123): RICA 123456; LICA 123456. Follow up 1 year;  c.  Carotid US (2/16):  R 40-59%; L 1-39%; FU 1 year.  Marland Kitchen Dysrhythmia    PVC's  . GERD (gastroesophageal reflux disease)   . Gout   . History of kidney stones    no problems with stones  . Hx of echocardiogram    Echo (01/2010):  EF 55-60%, ? Apical HK, mild BAE, atrial septal aneurysm.  Marland Kitchen  Hyperlipidemia   . Hypertension   . Ischemic colitis ([place])    noted on CT back in August 2013 possible related to hypotension  . Pneumonia    x 2  . PUD (peptic ulcer disease) 1974  . PVC's (premature ventricular contractions)    Event monitor in 04/2012: NSR, PVCs.  . ST elevation myocardial infarction (STEMI) of inferior wall ([place]) 2011   s/p CABG 2011  . Thoracic aortic aneurysm ([place])     stable on CT 01/13/10;  Chest/abdominal CTA (11/2011):  3.9 x 3.6 cm prox descending thoracic aorta (stable)    Past Surgical History:  Procedure Laterality Date  . CARDIAC CATHETERIZATION    . CHEST TUBE INSERTION  1992   fx lt ribs-collapsed lung  . CORONARY ARTERY BYPASS GRAFT  11/11   X 3 VESSELS  . cyst removed from back of neck    . cyst removel on right buttocks - 2014    . EAR CYST EXCISION Left 09/02/2012   Procedure: EXCISION SEBACEOUS CYST LEFT POSTERIOR LEG;  Surgeon: Pedro Earls, MD;  Location: [facility];  Service: General;  Laterality: Left;  . Excision of deep left neck mass. SURGEON:  Leonides Sake. Lucia Gaskins, M.D.  Cramer.Melena  . EYE SURGERY  both cararacts  . HERNIA REPAIR    . INGUINAL HERNIA REPAIR Right 12/02/2012   Procedure: OPEN RIGHT INGUINAL HERINA;  Surgeon: Pedro Earls, MD;  Location: [facility];  Service: General;  Laterality: Right;  . INGUINAL HERNIA REPAIR Left 11/02/2014   Procedure: OPEN LEFT INGUINAL HERNIA REPAIR WITH MESH;  Surgeon: Johnathan Hausen, MD;  Location: [facility];  Service: General;  Laterality: Left;  . INSERTION OF MESH Right 12/02/2012   Procedure: INSERTION OF MESH;  Surgeon: Pedro Earls, MD;  Location: [facility];  Service: General;  Laterality: Right;  . LUMBAR LAMINECTOMY/DECOMPRESSION MICRODISCECTOMY N/A 01/26/2015   Procedure: Lumbar 5-sacrum 1 decompression;  Surgeon: Phylliss Bob, MD;  Location: [facility];  Service: Orthopedics;  Laterality: N/A;  . TONSILLECTOMY      Current Medications: Current Meds  Medication Sig  .  allopurinol (ZYLOPRIM) 300 MG tablet Take 300 mg by mouth every morning.   Marland Kitchen aspirin 81 MG tablet Take 81 mg by mouth daily.  Marland Kitchen azithromycin (ZITHROMAX) 250 MG tablet Take 250 mg by mouth daily.  . Cholecalciferol (VITAMIN D3) 3000 UNITS TABS Take 3,000 Units by mouth daily.   . clotrimazole-betamethasone (LOTRISONE) cream APPLY TOPICALLY AS NEEDED. APPLY TO AFFECTED AREA DAILY OR AS DIRECTED FOOT IRRITATTION  . dexlansoprazole (DEXILANT) 60 MG capsule Take 60 mg by mouth every morning.   Marland Kitchen lisinopril (PRINIVIL,ZESTRIL) 20 MG tablet Take 20 mg by mouth at bedtime.  Marland Kitchen lisinopril (PRINIVIL,ZESTRIL) 40 MG tablet Take 40 mg by mouth every morning.  . metoprolol (LOPRESSOR) 50 MG tablet Take  1 tablet (50 mg total) by mouth 2 (two) times daily.  . Multiple Vitamin (MULTIVITAMIN WITH MINERALS) TABS tablet Take 1 tablet by mouth every morning.   . neomycin-bacitracin-polymyxin (NEOSPORIN) [phone] ointment Apply 1 application topically 2 (two) times daily as needed (FOR FOOT IRRITATION).   Marland Kitchen nitroGLYCERIN (NITROSTAT) 0.4 MG SL tablet Place 1 tablet (0.4 mg total) under the tongue every 5 (five) minutes as needed for chest pain.  . simvastatin (ZOCOR) 40 MG tablet Take 40 mg by mouth every morning.   . vitamin B-12 (CYANOCOBALAMIN) 500 MCG tablet Take 500 mcg by mouth daily.  . vitamin C (ASCORBIC ACID) 500 MG tablet Take 1,000 mg by mouth every morning.      Allergies:   Levaquin [levofloxacin] and Clotrimazole   Social History   Social History  . Marital status: Widowed    Spouse name: N/A  . Number of children: N/A  . Years of education: N/A   Social History Main Topics  . Smoking status: Former Smoker    Quit date: 02/25/1974  . Smokeless tobacco: Never Used  . Alcohol use Yes     Comment: rarely  . Drug use: No  . Sexual activity: Not Asked   Other Topics Concern  . None   Social History Narrative  . None     Family History  Problem Relation Age of Onset  . Heart failure  Mother   . Pneumonia Father   . Cancer Sister     lung  . Cancer Sister     esophagus  . Cancer Maternal Grandmother     pancreatic     ROS:   Please see the history of present illness.    Review of Systems  Constitution: Positive for chills and fever.  Cardiovascular: Positive for chest pain and dyspnea on exertion.  Respiratory: Positive for cough and wheezing.   Musculoskeletal: Positive for back pain and myalgias.   All other systems reviewed and are negative.   EKGs/Labs/Other Test Reviewed:    EKG:  EKG is  ordered today.  The ekg ordered today demonstrates NSR, HR 88, rightward axis, nonspecific ST-T wave changes, QTc 418 ms, no change from prior tracings  Recent Labs: 10/17/2015: BUN 22; Creat 0.99; Potassium 4.8; Sodium 138   Recent Lipid Panel No results found for: CHOL, TRIG, HDL, CHOLHDL, VLDL, LDLCALC, LDLDIRECT   Physical Exam:    VS:  BP 140/60   Pulse 88   Ht 5\' 10"  (1.778 m)   Wt 206 lb 12.8 oz (93.8 kg)   BMI 29.67 kg/m     Wt Readings from Last 3 Encounters:  04/05/16 206 lb 12.8 oz (93.8 kg)  11/20/15 213 lb 6.4 oz (96.8 kg)  05/04/15 201 lb 12.8 oz (91.5 kg)     Physical Exam  Constitutional: He is oriented to person, place, and time. He appears well-developed and well-nourished. No distress.  HENT:  Head: Normocephalic and atraumatic.  Eyes: No scleral icterus.  Neck: No JVD present.  Cardiovascular: Normal rate, regular rhythm and normal heart sounds.   No murmur heard. Pulmonary/Chest: Effort normal. He has no wheezes. He has no rales.  Abdominal: Soft. Bowel sounds are normal. There is no tenderness.  Musculoskeletal: He exhibits no edema.  Neurological: He is alert and oriented to person, place, and time.  Skin: Skin is warm and dry.  Psychiatric: He has a normal mood and affect.    ASSESSMENT:    1. Other chest pain   2. Coronary  artery disease involving native coronary artery of native heart without angina pectoris   3.  Essential hypertension   4. Hyperlipidemia, unspecified hyperlipidemia type   5. Bilateral carotid artery disease ([place])   6. Thoracic aortic aneurysm without rupture ([place])    PLAN:    In order of problems listed above:   1. Chest pain - He has more atypical features than typical.  I suspect his symptoms are related to his recent respiratory illness.  His ECG is unchanged.  However, he also notes some symptoms that are similar to his prior angina.  He had several hours of continuous pain earlier this week.    -  Obtain Troponin-T.  If abnormal will send to the ED for admission  -  Arrange Lexiscan Myoview  -  FU as planned or sooner if Myoview abnormal or symptoms persist.  2. CAD - s/p inferior STEMI and subsequent CABG in 2011.  Myoview in 2017 was low risk.  Will arrange Myoview now given chest pain to rule out ischemia.  Continue ASA, beta blocker, statin.    3. HTN - BP is controlled.    4. HL - Continue statin.    5. Carotid artery disease - Continue ASA, statin. FU carotid US is due next week.   6. Thoracic Aortic Aneurysm - Stable by CT in 8/17.      Medication Adjustments/Labs and Tests Ordered: Current medicines are reviewed at length with the patient today.  Concerns regarding medicines are outlined above.  Medication changes, Labs and Tests ordered today are outlined in the Patient Instructions noted below. Patient Instructions  Medication Instructions:  Your physician recommends that you continue on your current medications as directed. Please refer to the Current Medication list given to you today.  Labwork: 1. TODAY : STAT TROPONIN T  Testing/Procedures: 1. Your physician has requested that you have a lexiscan myoview. For further information please visit [email or web address]. Please follow instruction sheet, as given.  Follow-Up: KEEP FOLLOW UP APPT WITH DR. Angelena Form AS PLANNED  Any Other Special Instructions Will Be Listed Below (If Applicable).  If you need a  refill on your cardiac medications before your next appointment, please call your pharmacy.   Return for Scheduled Follow Up with Dr. Angelena Form.   Signed, Richardson Dopp, PA-C  04/05/2016 12:38 PM    [facility] [address], [place], [place]  [address] Phone: [phone]; Fax: [phone]

## 2016-04-05 NOTE — Telephone Encounter (Signed)
Lab results shown to DOD Dr. Caryl Comes who states Troponin T is negative. Pt has been notified of this lab results by phone with verbal understanding. Pt was thankful.

## 2016-04-05 NOTE — Patient Instructions (Addendum)
Medication Instructions:  Your physician recommends that you continue on your current medications as directed. Please refer to the Current Medication list given to you today.  Labwork: 1. TODAY : STAT TROPONIN T  Testing/Procedures: 1. Your physician has requested that you have a lexiscan myoview. For further information please visit [email or web address]. Please follow instruction sheet, as given.  Follow-Up: KEEP FOLLOW UP APPT WITH DR. Angelena Form AS PLANNED  Any Other Special Instructions Will Be Listed Below (If Applicable).  If you need a refill on your cardiac medications before your next appointment, please call your pharmacy.

## 2016-04-08 ENCOUNTER — Encounter (HOSPITAL_COMMUNITY): Payer: Self-pay

## 2016-04-09 ENCOUNTER — Inpatient Hospital Stay (HOSPITAL_COMMUNITY): Admission: RE | Admit: 2016-04-09 | Payer: Medicare Other | Source: Ambulatory Visit

## 2016-04-10 ENCOUNTER — Encounter (HOSPITAL_COMMUNITY): Payer: Self-pay

## 2016-04-11 ENCOUNTER — Telehealth (HOSPITAL_COMMUNITY): Payer: Self-pay

## 2016-04-11 NOTE — Telephone Encounter (Signed)
Encounter complete. 

## 2016-04-12 ENCOUNTER — Encounter (HOSPITAL_COMMUNITY): Payer: Self-pay

## 2016-04-15 ENCOUNTER — Encounter (HOSPITAL_COMMUNITY)
Admission: RE | Admit: 2016-04-15 | Discharge: 2016-04-15 | Disposition: A | Discharge status: Home / Self Care | Payer: Self-pay | Source: Ambulatory Visit | Attending: Cardiovascular Disease | Admitting: Cardiovascular Disease

## 2016-04-16 ENCOUNTER — Ambulatory Visit (HOSPITAL_COMMUNITY)
Admission: RE | Admit: 2016-04-16 | Discharge: 2016-04-16 | Disposition: A | Discharge status: Home / Self Care | Payer: Medicare Other | Source: Ambulatory Visit | Attending: Cardiology | Admitting: Cardiology

## 2016-04-16 DIAGNOSIS — R0789 Other chest pain: Secondary | ICD-10-CM | POA: Diagnosis not present

## 2016-04-16 DIAGNOSIS — I2510 Atherosclerotic heart disease of native coronary artery without angina pectoris: Secondary | ICD-10-CM

## 2016-04-16 DIAGNOSIS — I6523 Occlusion and stenosis of bilateral carotid arteries: Secondary | ICD-10-CM | POA: Insufficient documentation

## 2016-04-16 LAB — MYOCARDIAL PERFUSION IMAGING
CHL CUP NUCLEAR SDS: 2
CHL CUP RESTING HR STRESS: 70 {beats}/min
CSEPPHR: 90 {beats}/min
LV sys vol: 38 mL
LVDIAVOL: 96 mL (ref 62–150)
NUC STRESS TID: 1.07
SRS: 0
SSS: 2

## 2016-04-16 MED ORDER — TECHNETIUM TC 99M TETROFOSMIN IV KIT
10.9000 | PACK | Freq: Once | INTRAVENOUS | Status: AC | PRN
  Administered 2016-04-16: 10.9 via INTRAVENOUS
  Filled 2016-04-16: qty 11

## 2016-04-16 MED ORDER — TECHNETIUM TC 99M TETROFOSMIN IV KIT
30.5000 | PACK | Freq: Once | INTRAVENOUS | Status: AC | PRN
  Administered 2016-04-16: 30.5 via INTRAVENOUS
  Filled 2016-04-16: qty 31

## 2016-04-16 MED ORDER — REGADENOSON 0.4 MG/5ML IV SOLN
0.4000 mg | Freq: Once | INTRAVENOUS | Status: AC
  Administered 2016-04-16: 0.4 mg via INTRAVENOUS

## 2016-04-17 ENCOUNTER — Encounter (HOSPITAL_COMMUNITY)
Admission: RE | Admit: 2016-04-17 | Discharge: 2016-04-17 | Disposition: A | Discharge status: Home / Self Care | Payer: Self-pay | Source: Ambulatory Visit | Attending: Cardiovascular Disease | Admitting: Cardiovascular Disease

## 2016-04-17 ENCOUNTER — Encounter: Payer: Self-pay | Admitting: Physician Assistant

## 2016-04-19 ENCOUNTER — Encounter (HOSPITAL_COMMUNITY): Payer: Self-pay

## 2016-04-22 ENCOUNTER — Encounter (HOSPITAL_COMMUNITY): Payer: Self-pay

## 2016-04-24 ENCOUNTER — Encounter (HOSPITAL_COMMUNITY)
Admission: RE | Admit: 2016-04-24 | Discharge: 2016-04-24 | Disposition: A | Discharge status: Home / Self Care | Payer: Self-pay | Source: Ambulatory Visit | Attending: Cardiovascular Disease | Admitting: Cardiovascular Disease

## 2016-04-26 ENCOUNTER — Encounter (HOSPITAL_COMMUNITY): Payer: Self-pay

## 2016-04-26 DIAGNOSIS — I2510 Atherosclerotic heart disease of native coronary artery without angina pectoris: Secondary | ICD-10-CM | POA: Insufficient documentation

## 2016-04-29 ENCOUNTER — Encounter (HOSPITAL_COMMUNITY)
Admission: RE | Admit: 2016-04-29 | Discharge: 2016-04-29 | Disposition: A | Discharge status: Home / Self Care | Payer: Self-pay | Source: Ambulatory Visit | Attending: Cardiovascular Disease | Admitting: Cardiovascular Disease

## 2016-05-01 ENCOUNTER — Encounter (HOSPITAL_COMMUNITY): Payer: Self-pay

## 2016-05-03 ENCOUNTER — Encounter (HOSPITAL_COMMUNITY): Payer: Self-pay

## 2016-05-06 ENCOUNTER — Encounter (HOSPITAL_COMMUNITY): Payer: Self-pay

## 2016-05-08 ENCOUNTER — Encounter (HOSPITAL_COMMUNITY)
Admission: RE | Admit: 2016-05-08 | Discharge: 2016-05-08 | Disposition: A | Discharge status: Home / Self Care | Payer: Self-pay | Source: Ambulatory Visit | Attending: Cardiovascular Disease | Admitting: Cardiovascular Disease

## 2016-05-10 ENCOUNTER — Encounter (HOSPITAL_COMMUNITY)
Admission: RE | Admit: 2016-05-10 | Discharge: 2016-05-10 | Disposition: A | Discharge status: Home / Self Care | Payer: Self-pay | Source: Ambulatory Visit | Attending: Cardiovascular Disease | Admitting: Cardiovascular Disease

## 2016-05-13 ENCOUNTER — Encounter (HOSPITAL_COMMUNITY): Payer: Self-pay

## 2016-05-15 ENCOUNTER — Encounter (HOSPITAL_COMMUNITY): Payer: Self-pay

## 2016-05-17 ENCOUNTER — Encounter (HOSPITAL_COMMUNITY)
Admission: RE | Admit: 2016-05-17 | Discharge: 2016-05-17 | Disposition: A | Discharge status: Home / Self Care | Payer: Self-pay | Source: Ambulatory Visit | Attending: Cardiovascular Disease | Admitting: Cardiovascular Disease

## 2016-05-20 ENCOUNTER — Encounter (HOSPITAL_COMMUNITY)
Admission: RE | Admit: 2016-05-20 | Discharge: 2016-05-20 | Disposition: A | Discharge status: Home / Self Care | Payer: Self-pay | Source: Ambulatory Visit | Attending: Cardiovascular Disease | Admitting: Cardiovascular Disease

## 2016-05-22 ENCOUNTER — Encounter (HOSPITAL_COMMUNITY): Payer: Self-pay

## 2016-05-23 ENCOUNTER — Other Ambulatory Visit: Payer: Self-pay | Admitting: Surgery

## 2016-05-23 DIAGNOSIS — K4090 Unilateral inguinal hernia, without obstruction or gangrene, not specified as recurrent: Secondary | ICD-10-CM

## 2016-05-24 ENCOUNTER — Encounter (HOSPITAL_COMMUNITY)
Admission: RE | Admit: 2016-05-24 | Discharge: 2016-05-24 | Disposition: A | Discharge status: Home / Self Care | Payer: Self-pay | Source: Ambulatory Visit | Attending: Cardiovascular Disease | Admitting: Cardiovascular Disease

## 2016-05-27 ENCOUNTER — Encounter (HOSPITAL_COMMUNITY)
Admission: RE | Admit: 2016-05-27 | Discharge: 2016-05-27 | Disposition: A | Discharge status: Home / Self Care | Payer: Self-pay | Source: Ambulatory Visit | Attending: Cardiovascular Disease | Admitting: Cardiovascular Disease

## 2016-05-27 DIAGNOSIS — I2510 Atherosclerotic heart disease of native coronary artery without angina pectoris: Secondary | ICD-10-CM | POA: Insufficient documentation

## 2016-05-28 ENCOUNTER — Other Ambulatory Visit: Payer: Self-pay | Admitting: Surgery

## 2016-05-28 DIAGNOSIS — K4090 Unilateral inguinal hernia, without obstruction or gangrene, not specified as recurrent: Secondary | ICD-10-CM

## 2016-05-29 ENCOUNTER — Encounter (HOSPITAL_COMMUNITY): Payer: Self-pay

## 2016-05-30 ENCOUNTER — Ambulatory Visit
Admission: RE | Admit: 2016-05-30 | Discharge: 2016-05-30 | Disposition: A | Discharge status: Home / Self Care | Payer: Medicare Other | Source: Ambulatory Visit | Attending: Surgery | Admitting: Surgery

## 2016-05-30 DIAGNOSIS — K4090 Unilateral inguinal hernia, without obstruction or gangrene, not specified as recurrent: Secondary | ICD-10-CM

## 2016-05-30 MED ORDER — IOPAMIDOL (ISOVUE-300) INJECTION 61%
100.0000 mL | Freq: Once | INTRAVENOUS | Status: AC | PRN
  Administered 2016-05-30: 100 mL via INTRAVENOUS

## 2016-05-31 ENCOUNTER — Encounter (HOSPITAL_COMMUNITY)
Admission: RE | Admit: 2016-05-31 | Discharge: 2016-05-31 | Disposition: A | Discharge status: Home / Self Care | Payer: Self-pay | Source: Ambulatory Visit | Attending: Cardiovascular Disease | Admitting: Cardiovascular Disease

## 2016-06-03 ENCOUNTER — Encounter (HOSPITAL_COMMUNITY): Payer: Self-pay

## 2016-06-05 ENCOUNTER — Encounter (HOSPITAL_COMMUNITY)
Admission: RE | Admit: 2016-06-05 | Discharge: 2016-06-05 | Disposition: A | Discharge status: Home / Self Care | Payer: Self-pay | Source: Ambulatory Visit | Attending: Cardiovascular Disease | Admitting: Cardiovascular Disease

## 2016-06-07 ENCOUNTER — Encounter (HOSPITAL_COMMUNITY)
Admission: RE | Admit: 2016-06-07 | Discharge: 2016-06-07 | Disposition: A | Discharge status: Home / Self Care | Payer: Self-pay | Source: Ambulatory Visit | Attending: Cardiovascular Disease | Admitting: Cardiovascular Disease

## 2016-06-10 ENCOUNTER — Encounter (HOSPITAL_COMMUNITY)
Admission: RE | Admit: 2016-06-10 | Discharge: 2016-06-10 | Disposition: A | Discharge status: Home / Self Care | Payer: Self-pay | Source: Ambulatory Visit | Attending: Cardiovascular Disease | Admitting: Cardiovascular Disease

## 2016-06-12 ENCOUNTER — Encounter (HOSPITAL_COMMUNITY)
Admission: RE | Admit: 2016-06-12 | Discharge: 2016-06-12 | Disposition: A | Discharge status: Home / Self Care | Payer: Self-pay | Source: Ambulatory Visit | Attending: Cardiovascular Disease | Admitting: Cardiovascular Disease

## 2016-06-14 ENCOUNTER — Encounter (HOSPITAL_COMMUNITY): Payer: Self-pay

## 2016-06-17 ENCOUNTER — Encounter (HOSPITAL_COMMUNITY)
Admission: RE | Admit: 2016-06-17 | Discharge: 2016-06-17 | Disposition: A | Discharge status: Home / Self Care | Payer: Self-pay | Source: Ambulatory Visit | Attending: Cardiovascular Disease | Admitting: Cardiovascular Disease

## 2016-06-19 ENCOUNTER — Encounter (HOSPITAL_COMMUNITY)
Admission: RE | Admit: 2016-06-19 | Discharge: 2016-06-19 | Disposition: A | Discharge status: Home / Self Care | Payer: Self-pay | Source: Ambulatory Visit | Attending: Cardiovascular Disease | Admitting: Cardiovascular Disease

## 2016-06-21 ENCOUNTER — Encounter (HOSPITAL_COMMUNITY)
Admission: RE | Admit: 2016-06-21 | Discharge: 2016-06-21 | Disposition: A | Discharge status: Home / Self Care | Payer: Self-pay | Source: Ambulatory Visit | Attending: Cardiovascular Disease | Admitting: Cardiovascular Disease

## 2016-06-24 ENCOUNTER — Encounter (HOSPITAL_COMMUNITY): Payer: Self-pay

## 2016-06-26 ENCOUNTER — Encounter (HOSPITAL_COMMUNITY): Payer: Self-pay

## 2016-06-26 DIAGNOSIS — I2510 Atherosclerotic heart disease of native coronary artery without angina pectoris: Secondary | ICD-10-CM | POA: Insufficient documentation

## 2016-06-28 ENCOUNTER — Encounter (HOSPITAL_COMMUNITY): Payer: Self-pay

## 2016-07-01 ENCOUNTER — Encounter (HOSPITAL_COMMUNITY)
Admission: RE | Admit: 2016-07-01 | Discharge: 2016-07-01 | Disposition: A | Discharge status: Home / Self Care | Payer: Self-pay | Source: Ambulatory Visit | Attending: Cardiovascular Disease | Admitting: Cardiovascular Disease

## 2016-07-03 ENCOUNTER — Encounter (HOSPITAL_COMMUNITY): Payer: Self-pay

## 2016-07-05 ENCOUNTER — Encounter (HOSPITAL_COMMUNITY): Payer: Self-pay

## 2016-07-08 ENCOUNTER — Encounter (HOSPITAL_COMMUNITY)
Admission: RE | Admit: 2016-07-08 | Discharge: 2016-07-08 | Disposition: A | Discharge status: Home / Self Care | Payer: Self-pay | Source: Ambulatory Visit | Attending: Cardiovascular Disease | Admitting: Cardiovascular Disease

## 2016-07-10 ENCOUNTER — Encounter (HOSPITAL_COMMUNITY): Payer: Self-pay

## 2016-07-12 ENCOUNTER — Encounter (HOSPITAL_COMMUNITY)
Admission: RE | Admit: 2016-07-12 | Discharge: 2016-07-12 | Disposition: A | Discharge status: Home / Self Care | Payer: Self-pay | Source: Ambulatory Visit | Attending: Cardiovascular Disease | Admitting: Cardiovascular Disease

## 2016-07-15 ENCOUNTER — Encounter (HOSPITAL_COMMUNITY): Payer: Self-pay

## 2016-07-15 ENCOUNTER — Other Ambulatory Visit: Payer: Self-pay | Admitting: Surgery

## 2016-07-15 DIAGNOSIS — K838 Other specified diseases of biliary tract: Secondary | ICD-10-CM

## 2016-07-17 ENCOUNTER — Encounter (HOSPITAL_COMMUNITY)
Admission: RE | Admit: 2016-07-17 | Discharge: 2016-07-17 | Disposition: A | Discharge status: Home / Self Care | Payer: Self-pay | Source: Ambulatory Visit | Attending: Cardiovascular Disease | Admitting: Cardiovascular Disease

## 2016-07-19 ENCOUNTER — Encounter (HOSPITAL_COMMUNITY)
Admission: RE | Admit: 2016-07-19 | Discharge: 2016-07-19 | Disposition: A | Discharge status: Home / Self Care | Payer: Self-pay | Source: Ambulatory Visit | Attending: Cardiovascular Disease | Admitting: Cardiovascular Disease

## 2016-07-24 ENCOUNTER — Encounter (HOSPITAL_COMMUNITY): Payer: Self-pay

## 2016-07-26 ENCOUNTER — Encounter (HOSPITAL_COMMUNITY): Payer: Self-pay

## 2016-07-26 DIAGNOSIS — I2510 Atherosclerotic heart disease of native coronary artery without angina pectoris: Secondary | ICD-10-CM | POA: Insufficient documentation

## 2016-07-29 ENCOUNTER — Encounter (HOSPITAL_COMMUNITY)
Admission: RE | Admit: 2016-07-29 | Discharge: 2016-07-29 | Disposition: A | Discharge status: Home / Self Care | Payer: Self-pay | Source: Ambulatory Visit | Attending: Cardiovascular Disease | Admitting: Cardiovascular Disease

## 2016-07-31 ENCOUNTER — Encounter (HOSPITAL_COMMUNITY): Payer: Self-pay

## 2016-08-02 ENCOUNTER — Encounter (HOSPITAL_COMMUNITY)
Admission: RE | Admit: 2016-08-02 | Discharge: 2016-08-02 | Disposition: A | Discharge status: Home / Self Care | Payer: Self-pay | Source: Ambulatory Visit | Attending: Cardiovascular Disease | Admitting: Cardiovascular Disease

## 2016-08-05 ENCOUNTER — Encounter (HOSPITAL_COMMUNITY)
Admission: RE | Admit: 2016-08-05 | Discharge: 2016-08-05 | Disposition: A | Discharge status: Home / Self Care | Payer: Self-pay | Source: Ambulatory Visit | Attending: Cardiovascular Disease | Admitting: Cardiovascular Disease

## 2016-08-07 ENCOUNTER — Encounter (HOSPITAL_COMMUNITY): Payer: Self-pay

## 2016-08-09 ENCOUNTER — Encounter (HOSPITAL_COMMUNITY)
Admission: RE | Admit: 2016-08-09 | Discharge: 2016-08-09 | Disposition: A | Discharge status: Home / Self Care | Payer: Self-pay | Source: Ambulatory Visit | Attending: Cardiovascular Disease | Admitting: Cardiovascular Disease

## 2016-08-12 ENCOUNTER — Encounter (HOSPITAL_COMMUNITY): Payer: Self-pay

## 2016-08-12 ENCOUNTER — Ambulatory Visit
Admission: RE | Admit: 2016-08-12 | Discharge: 2016-08-12 | Disposition: A | Discharge status: Home / Self Care | Payer: Medicare Other | Source: Ambulatory Visit | Attending: Surgery | Admitting: Surgery

## 2016-08-12 DIAGNOSIS — K838 Other specified diseases of biliary tract: Secondary | ICD-10-CM

## 2016-08-14 ENCOUNTER — Encounter (HOSPITAL_COMMUNITY)
Admission: RE | Admit: 2016-08-14 | Discharge: 2016-08-14 | Disposition: A | Discharge status: Home / Self Care | Payer: Self-pay | Source: Ambulatory Visit | Attending: Cardiovascular Disease | Admitting: Cardiovascular Disease

## 2016-08-16 ENCOUNTER — Encounter (HOSPITAL_COMMUNITY)
Admission: RE | Admit: 2016-08-16 | Discharge: 2016-08-16 | Disposition: A | Discharge status: Home / Self Care | Payer: Self-pay | Source: Ambulatory Visit | Attending: Cardiovascular Disease | Admitting: Cardiovascular Disease

## 2016-08-19 ENCOUNTER — Encounter (HOSPITAL_COMMUNITY)
Admission: RE | Admit: 2016-08-19 | Discharge: 2016-08-19 | Disposition: A | Discharge status: Home / Self Care | Payer: Self-pay | Source: Ambulatory Visit | Attending: Cardiovascular Disease | Admitting: Cardiovascular Disease

## 2016-08-21 ENCOUNTER — Encounter (HOSPITAL_COMMUNITY)
Admission: RE | Admit: 2016-08-21 | Discharge: 2016-08-21 | Disposition: A | Discharge status: Home / Self Care | Payer: Self-pay | Source: Ambulatory Visit | Attending: Cardiovascular Disease | Admitting: Cardiovascular Disease

## 2016-08-23 ENCOUNTER — Encounter (HOSPITAL_COMMUNITY): Payer: Self-pay

## 2016-08-26 ENCOUNTER — Encounter (HOSPITAL_COMMUNITY)
Admission: RE | Admit: 2016-08-26 | Discharge: 2016-08-26 | Disposition: A | Discharge status: Home / Self Care | Payer: Self-pay | Source: Ambulatory Visit | Attending: Cardiovascular Disease | Admitting: Cardiovascular Disease

## 2016-08-26 DIAGNOSIS — I2510 Atherosclerotic heart disease of native coronary artery without angina pectoris: Secondary | ICD-10-CM | POA: Insufficient documentation

## 2016-08-30 ENCOUNTER — Encounter (HOSPITAL_COMMUNITY): Payer: Self-pay

## 2016-09-02 ENCOUNTER — Encounter (HOSPITAL_COMMUNITY)
Admission: RE | Admit: 2016-09-02 | Discharge: 2016-09-02 | Disposition: A | Discharge status: Home / Self Care | Payer: Self-pay | Source: Ambulatory Visit | Attending: Cardiovascular Disease | Admitting: Cardiovascular Disease

## 2016-09-04 ENCOUNTER — Encounter (HOSPITAL_COMMUNITY): Payer: Self-pay

## 2016-09-06 ENCOUNTER — Encounter (HOSPITAL_COMMUNITY)
Admission: RE | Admit: 2016-09-06 | Discharge: 2016-09-06 | Disposition: A | Discharge status: Home / Self Care | Payer: Self-pay | Source: Ambulatory Visit | Attending: Cardiovascular Disease | Admitting: Cardiovascular Disease

## 2016-09-09 ENCOUNTER — Encounter (HOSPITAL_COMMUNITY)
Admission: RE | Admit: 2016-09-09 | Discharge: 2016-09-09 | Disposition: A | Discharge status: Home / Self Care | Payer: Self-pay | Source: Ambulatory Visit | Attending: Cardiovascular Disease | Admitting: Cardiovascular Disease

## 2016-09-11 ENCOUNTER — Encounter (HOSPITAL_COMMUNITY): Payer: Self-pay

## 2016-09-13 ENCOUNTER — Encounter (HOSPITAL_COMMUNITY): Payer: Self-pay

## 2016-09-16 ENCOUNTER — Encounter (HOSPITAL_COMMUNITY): Payer: Self-pay

## 2016-09-18 ENCOUNTER — Encounter (HOSPITAL_COMMUNITY): Payer: Self-pay

## 2016-09-20 ENCOUNTER — Encounter (HOSPITAL_COMMUNITY)
Admission: RE | Admit: 2016-09-20 | Discharge: 2016-09-20 | Disposition: A | Discharge status: Home / Self Care | Payer: Self-pay | Source: Ambulatory Visit | Attending: Cardiovascular Disease | Admitting: Cardiovascular Disease

## 2016-09-23 ENCOUNTER — Encounter (HOSPITAL_COMMUNITY)
Admission: RE | Admit: 2016-09-23 | Discharge: 2016-09-23 | Disposition: A | Discharge status: Home / Self Care | Payer: Self-pay | Source: Ambulatory Visit | Attending: Cardiovascular Disease | Admitting: Cardiovascular Disease

## 2016-09-24 ENCOUNTER — Other Ambulatory Visit: Payer: Self-pay | Admitting: Cardiovascular Disease

## 2016-09-25 ENCOUNTER — Encounter (HOSPITAL_COMMUNITY): Payer: Self-pay

## 2016-09-25 DIAGNOSIS — I2510 Atherosclerotic heart disease of native coronary artery without angina pectoris: Secondary | ICD-10-CM | POA: Insufficient documentation

## 2016-09-27 ENCOUNTER — Encounter (HOSPITAL_COMMUNITY)
Admission: RE | Admit: 2016-09-27 | Discharge: 2016-09-27 | Disposition: A | Discharge status: Home / Self Care | Payer: Self-pay | Source: Ambulatory Visit | Attending: Cardiovascular Disease | Admitting: Cardiovascular Disease

## 2016-09-30 ENCOUNTER — Encounter (HOSPITAL_COMMUNITY)
Admission: RE | Admit: 2016-09-30 | Discharge: 2016-09-30 | Disposition: A | Discharge status: Home / Self Care | Payer: Medicare Other | Source: Ambulatory Visit | Attending: Cardiovascular Disease | Admitting: Cardiovascular Disease

## 2016-10-02 ENCOUNTER — Encounter (HOSPITAL_COMMUNITY)
Admission: RE | Admit: 2016-10-02 | Discharge: 2016-10-02 | Disposition: A | Discharge status: Home / Self Care | Payer: Self-pay | Source: Ambulatory Visit | Attending: Cardiovascular Disease | Admitting: Cardiovascular Disease

## 2016-10-04 ENCOUNTER — Encounter (HOSPITAL_COMMUNITY)
Admission: RE | Admit: 2016-10-04 | Discharge: 2016-10-04 | Disposition: A | Discharge status: Home / Self Care | Payer: Self-pay | Source: Ambulatory Visit | Attending: Cardiovascular Disease | Admitting: Cardiovascular Disease

## 2016-10-07 ENCOUNTER — Encounter (HOSPITAL_COMMUNITY)
Admission: RE | Admit: 2016-10-07 | Discharge: 2016-10-07 | Disposition: A | Discharge status: Home / Self Care | Payer: Self-pay | Source: Ambulatory Visit | Attending: Cardiovascular Disease | Admitting: Cardiovascular Disease

## 2016-10-09 ENCOUNTER — Encounter (HOSPITAL_COMMUNITY)
Admission: RE | Admit: 2016-10-09 | Discharge: 2016-10-09 | Disposition: A | Discharge status: Home / Self Care | Payer: Self-pay | Source: Ambulatory Visit | Attending: Cardiovascular Disease | Admitting: Cardiovascular Disease

## 2016-10-11 ENCOUNTER — Encounter (HOSPITAL_COMMUNITY): Payer: Self-pay

## 2016-10-14 ENCOUNTER — Encounter (HOSPITAL_COMMUNITY): Payer: Self-pay

## 2016-10-16 ENCOUNTER — Encounter (HOSPITAL_COMMUNITY): Payer: Self-pay

## 2016-10-18 ENCOUNTER — Encounter (HOSPITAL_COMMUNITY): Payer: Self-pay

## 2016-10-21 ENCOUNTER — Encounter (HOSPITAL_COMMUNITY)
Admission: RE | Admit: 2016-10-21 | Discharge: 2016-10-21 | Disposition: A | Discharge status: Home / Self Care | Payer: Self-pay | Source: Ambulatory Visit | Attending: Cardiovascular Disease | Admitting: Cardiovascular Disease

## 2016-10-21 ENCOUNTER — Other Ambulatory Visit: Payer: Self-pay | Admitting: Cardiovascular Disease

## 2016-10-23 ENCOUNTER — Encounter (HOSPITAL_COMMUNITY)
Admission: RE | Admit: 2016-10-23 | Discharge: 2016-10-23 | Disposition: A | Discharge status: Home / Self Care | Payer: Medicare Other | Source: Ambulatory Visit | Attending: Cardiovascular Disease | Admitting: Cardiovascular Disease

## 2016-10-25 ENCOUNTER — Encounter (HOSPITAL_COMMUNITY): Payer: Self-pay

## 2016-10-30 ENCOUNTER — Encounter (HOSPITAL_COMMUNITY): Payer: Self-pay

## 2016-10-30 DIAGNOSIS — I2510 Atherosclerotic heart disease of native coronary artery without angina pectoris: Secondary | ICD-10-CM | POA: Insufficient documentation

## 2016-11-01 ENCOUNTER — Encounter (HOSPITAL_COMMUNITY): Payer: Self-pay

## 2016-11-04 ENCOUNTER — Encounter (HOSPITAL_COMMUNITY)
Admission: RE | Admit: 2016-11-04 | Discharge: 2016-11-04 | Disposition: A | Discharge status: Home / Self Care | Payer: Self-pay | Source: Ambulatory Visit | Attending: Cardiovascular Disease | Admitting: Cardiovascular Disease

## 2016-11-06 ENCOUNTER — Encounter (HOSPITAL_COMMUNITY)
Admission: RE | Admit: 2016-11-06 | Discharge: 2016-11-06 | Disposition: A | Discharge status: Home / Self Care | Payer: Self-pay | Source: Ambulatory Visit | Attending: Cardiovascular Disease | Admitting: Cardiovascular Disease

## 2016-11-08 ENCOUNTER — Encounter (HOSPITAL_COMMUNITY): Payer: Self-pay

## 2016-11-11 ENCOUNTER — Encounter (HOSPITAL_COMMUNITY): Payer: Self-pay

## 2016-11-13 ENCOUNTER — Encounter: Payer: Self-pay | Admitting: *Deleted

## 2016-11-13 ENCOUNTER — Encounter (HOSPITAL_COMMUNITY)
Admission: RE | Admit: 2016-11-13 | Discharge: 2016-11-13 | Disposition: A | Discharge status: Home / Self Care | Payer: Self-pay | Source: Ambulatory Visit | Attending: Cardiovascular Disease | Admitting: Cardiovascular Disease

## 2016-11-15 ENCOUNTER — Encounter (HOSPITAL_COMMUNITY)
Admission: RE | Admit: 2016-11-15 | Discharge: 2016-11-15 | Disposition: A | Discharge status: Home / Self Care | Payer: Self-pay | Source: Ambulatory Visit | Attending: Cardiovascular Disease | Admitting: Cardiovascular Disease

## 2016-11-18 ENCOUNTER — Encounter (HOSPITAL_COMMUNITY)
Admission: RE | Admit: 2016-11-18 | Discharge: 2016-11-18 | Disposition: A | Discharge status: Home / Self Care | Payer: Self-pay | Source: Ambulatory Visit | Attending: Cardiovascular Disease | Admitting: Cardiovascular Disease

## 2016-11-20 ENCOUNTER — Encounter (HOSPITAL_COMMUNITY)
Admission: RE | Admit: 2016-11-20 | Discharge: 2016-11-20 | Disposition: A | Discharge status: Home / Self Care | Payer: Self-pay | Source: Ambulatory Visit | Attending: Cardiovascular Disease | Admitting: Cardiovascular Disease

## 2016-11-22 ENCOUNTER — Encounter (HOSPITAL_COMMUNITY)
Admission: RE | Admit: 2016-11-22 | Discharge: 2016-11-22 | Disposition: A | Discharge status: Home / Self Care | Payer: Self-pay | Source: Ambulatory Visit | Attending: Cardiovascular Disease | Admitting: Cardiovascular Disease

## 2016-11-25 ENCOUNTER — Encounter (HOSPITAL_COMMUNITY)
Admission: RE | Admit: 2016-11-25 | Discharge: 2016-11-25 | Disposition: A | Discharge status: Home / Self Care | Payer: Self-pay | Source: Ambulatory Visit | Attending: Cardiovascular Disease | Admitting: Cardiovascular Disease

## 2016-11-25 DIAGNOSIS — I2510 Atherosclerotic heart disease of native coronary artery without angina pectoris: Secondary | ICD-10-CM | POA: Insufficient documentation

## 2016-11-25 NOTE — Progress Notes (Addendum)
Cardiology Office Note:    Date:  11/26/2016   ID:  Samuel Coleman, DOB 1945/07/08, MRN [id number]  PCP:  Jani Gravel, MD  Cardiologist:  Dr. Angelena Form  Referring MD: Jani Gravel, MD   Chief Complaint  Patient presents with  . Follow-up    CAD    History of Present Illness:    Samuel Coleman is a 71 y.o. male with a past medical history significant for CAD S/P inferior STEMI and subsequent CABG ( L-LAD, S-OM, S-RCA) in 12/2009, hypertension, hyperlipidemia, prior ischemic colitis, thoracic aortic aneurysm, GERD.  The patient was last seen in the office by Richardson Dopp, PA on 04/05/2016 at which time he had flulike symptoms on Tamiflu and antibiotic. He had some chest discomfort which was more atypical than typical. He had a negative troponin and a stress Myoview which was low risk with EF 60%.  The patient had no further chest pain until he had some central chest pressure on Sunday that occurred while he was doing paperwork for his job and after he drank a large Diet Coke. This lasted for several hours and was not associated with any shortness of breath, nausea, diaphoresis or lightheadedness. He has had no discomfort since that time and feels that it was GI related. He attends cardiac rehabilitation in the maintenance program 2-4 times per week walking on the treadmill for 30 minutes without any exertional chest discomfort or shortness of breath. He has mild shortness of breath with walking up one to 2 flights of stairs and is not new. He denies orthopnea, PND. He does complain of some left lower extremity edema. He says that he had a slow healing wound on his left ankle earlier this year and was treated at wound care  center in [place]. He was diagnosed with venous insufficiency of both lower extremities. Notes from wound care indicate PVR studies were normal in 02/2016. The patient was advised to use compression stockings and elevate his lower extremities. He is not wearing  compression stockings as they are too hard to get on. The patient also admits that he is out almost every meal at a [employer] He does not add salt but the food he eats is likely high in sodium.  Past Medical History:  Diagnosis Date  . Anxiety   . Arthritis   . Asthma    exercise induced  . Back pain, chronic    gets injections in back  . CAD (coronary artery disease)    a.  prior inferior MI; s/p 3V CABG 01/01/10 (LIMA to LAD, SVG to OM, SVG to RCA- Dr.Bartle);  b. Last Myoview (05/2012):  Normal; EF 70% // c. Myoview 2/18: EF 60, no ischemia or infarction, Normal Study  . Carotid stenosis    a. Carotid US (01/2011):  Bilateral ICA 0-39% (repeat in 1 year).;  b.  Carotid US (06/930): RICA 67-12%; LICA 4-58%. Follow up 1 year;  c.  Carotid US (2/16):  R 40-59%; L 1-39%; FU 1 year.  Marland Kitchen Dysrhythmia    PVC's  . GERD (gastroesophageal reflux disease)   . Gout   . History of kidney stones    no problems with stones  . Hx of echocardiogram    Echo (01/2010):  EF 55-60%, ? Apical HK, mild BAE, atrial septal aneurysm.  Marland Kitchen Hyperlipidemia   . Hypertension   . Ischemic colitis ([place])    noted on CT back in August 2013 possible related to hypotension  . Pneumonia  x 2  . PUD (peptic ulcer disease) 1974  . PVC's (premature ventricular contractions)    Event monitor in 04/2012: NSR, PVCs.  . ST elevation myocardial infarction (STEMI) of inferior wall ([place]) 2011   s/p CABG 2011  . Thoracic aortic aneurysm ([place])     stable on CT 01/13/10;  Chest/abdominal CTA (11/2011):  3.9 x 3.6 cm prox descending thoracic aorta (stable)    Past Surgical History:  Procedure Laterality Date  . CARDIAC CATHETERIZATION    . CHEST TUBE INSERTION  1992   fx lt ribs-collapsed lung  . CORONARY ARTERY BYPASS GRAFT  11/11   X 3 VESSELS  . cyst removed from back of neck    . cyst removel on right buttocks - 2014    . EAR CYST EXCISION Left 09/02/2012   Procedure: EXCISION SEBACEOUS CYST LEFT POSTERIOR LEG;   Surgeon: Pedro Earls, MD;  Location: [facility];  Service: General;  Laterality: Left;  . Excision of deep left neck mass. SURGEON:  Leonides Sake. Lucia Gaskins, M.D.  Cramer.Melena  . EYE SURGERY  both cararacts  . HERNIA REPAIR    . INGUINAL HERNIA REPAIR Right 12/02/2012   Procedure: OPEN RIGHT INGUINAL HERINA;  Surgeon: Pedro Earls, MD;  Location: [facility];  Service: General;  Laterality: Right;  . INGUINAL HERNIA REPAIR Left 11/02/2014   Procedure: OPEN LEFT INGUINAL HERNIA REPAIR WITH MESH;  Surgeon: Johnathan Hausen, MD;  Location: [facility];  Service: General;  Laterality: Left;  . INSERTION OF MESH Right 12/02/2012   Procedure: INSERTION OF MESH;  Surgeon: Pedro Earls, MD;  Location: [facility];  Service: General;  Laterality: Right;  . LUMBAR LAMINECTOMY/DECOMPRESSION MICRODISCECTOMY N/A 01/26/2015   Procedure: Lumbar 5-sacrum 1 decompression;  Surgeon: Phylliss Bob, MD;  Location: [facility];  Service: Orthopedics;  Laterality: N/A;  . TONSILLECTOMY      Current Medications: Current Meds  Medication Sig  . allopurinol (ZYLOPRIM) 300 MG tablet Take 300 mg by mouth every morning.   Marland Kitchen aspirin 81 MG tablet Take 81 mg by mouth daily.  . Cholecalciferol (VITAMIN D3) 3000 UNITS TABS Take 3,000 Units by mouth daily.   . clotrimazole-betamethasone (LOTRISONE) cream APPLY TOPICALLY AS NEEDED. APPLY TO AFFECTED AREA DAILY OR AS DIRECTED FOOT IRRITATTION  . dexlansoprazole (DEXILANT) 60 MG capsule Take 60 mg by mouth every morning.   Marland Kitchen lisinopril (PRINIVIL,ZESTRIL) 20 MG tablet Take 20 mg by mouth at bedtime.  Marland Kitchen lisinopril (PRINIVIL,ZESTRIL) 40 MG tablet Take 40 mg by mouth every morning.  . metoprolol tartrate (LOPRESSOR) 50 MG tablet TAKE 1 TABLET BY MOUTH TWICE A DAY  . Multiple Vitamin (MULTIVITAMIN WITH MINERALS) TABS tablet Take 1 tablet by mouth every morning.   . neomycin-bacitracin-polymyxin (NEOSPORIN) [phone] ointment Apply 1 application topically 2 (two) times daily as needed (FOR  FOOT IRRITATION).   Marland Kitchen nitroGLYCERIN (NITROSTAT) 0.4 MG SL tablet Place 1 tablet (0.4 mg total) under the tongue every 5 (five) minutes as needed for chest pain.  . simvastatin (ZOCOR) 40 MG tablet Take 40 mg by mouth every morning.   . vitamin B-12 (CYANOCOBALAMIN) 500 MCG tablet Take 500 mcg by mouth daily.  . vitamin C (ASCORBIC ACID) 500 MG tablet Take 1,000 mg by mouth every morning.   . [DISCONTINUED] nitroGLYCERIN (NITROSTAT) 0.4 MG SL tablet Place 1 tablet (0.4 mg total) under the tongue every 5 (five) minutes as needed for chest pain.     Allergies:   Levaquin [levofloxacin] and Clotrimazole  Social History   Social History  . Marital status: Widowed    Spouse name: N/A  . Number of children: N/A  . Years of education: N/A   Social History Main Topics  . Smoking status: Former Smoker    Quit date: 02/25/1974  . Smokeless tobacco: Never Used  . Alcohol use Yes     Comment: rarely  . Drug use: No  . Sexual activity: Not Asked   Other Topics Concern  . None   Social History Narrative  . None    Family History: The patient's family history includes Esophageal cancer in his sister; Heart failure in his mother; Lung cancer in his sister; Pancreatic cancer in his maternal grandmother; Pneumonia in his father. ROS:   Please see the history of present illness.     All other systems reviewed and are negative.  EKGs/Labs/Other Studies Reviewed:    The following studies were reviewed today:  Myocardial perfusion study/Lexiscan 04/16/2016 Study Highlights    The left ventricular ejection fraction is normal (55-65%).  Nuclear stress EF: 60%.  There was no ST segment deviation noted during stress.  The study is normal.   Normal, no evidence for ischemia or infarction.    Carotid artery ultrasound 04/16/2016 Duplex imaging, with color Doppler, of the carotid arteries reveals heterogeneous plaque in both bifurcations. Bilateral ICA velocities are elevated and have  increased since prior exam. The subclavian arteries are widely patent, with elevated velocities on the right, normal velocity flow on the left. The vertebral arteries are patent with antegrade flow, bilaterally.  Heterogeneous plaque, bilaterally. 1-39% RICA stenosis, progression of stenosis, now in the high end range. Essentially stable 0-73% LICA stenosis. Normal subclavian arteries, bilaterally. Patent vertebral arteries with antegrade flow. 1 year f/u due to increase in velocity   Carotid artery ultrasound 04/10/15 Heterogeneous plaque, bilaterally. Stable 40-59% RICA stenosis. Stable 7-10% LICA stenosis. Normal subclavian arteries, bilaterally. Patent vertebral arteries with antegrade flow. F/U 1 year.  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  Echo (01/2010): EF 55-60%, ? Apical HK, mild BAE, atrial septal aneurysm  EKG:  EKG is  ordered today.  The ekg ordered today demonstrates Normal sinus rhythm at 78 bpm with no ischemic changes. No change from previous.  Recent Labs: No results found for requested labs within last 8760 hours.   Recent Lipid Panel No results found for: CHOL, TRIG, HDL, CHOLHDL, VLDL, LDLCALC, LDLDIRECT  Physical Exam:    VS:  BP 130/86 (BP Location: Left Arm, Patient Position: Sitting, Cuff Size: Large)   Pulse 78   Ht 5\' 10"  (1.778 m)   Wt 211 lb (95.7 kg)   SpO2 98%   BMI 30.28 kg/m     Wt Readings from Last 3 Encounters:  11/26/16 211 lb (95.7 kg)  04/16/16 206 lb (93.4 kg)  04/05/16 206 lb 12.8 oz (93.8 kg)     Physical Exam  Constitutional: He is oriented to person, place, and time. He appears well-developed and well-nourished. No distress.  HENT:  Head: Normocephalic and atraumatic.  Neck: Normal range of motion. No JVD present.  Cardiovascular: Normal rate and regular rhythm.  Exam reveals gallop. Exam reveals no friction rub.   No murmur  heard. Pulmonary/Chest: Effort normal and breath sounds normal. No respiratory distress. He has no wheezes. He has no rales. He exhibits no tenderness.  Abdominal: Soft. Bowel sounds are normal. He exhibits no distension. There is  no tenderness.  Musculoskeletal: Normal range of motion. He exhibits edema.  Trace left lower leg.  Neurological: He is alert and oriented to person, place, and time.  Skin: Skin is warm and dry.  Psychiatric: He has a normal mood and affect. His behavior is normal. Thought content normal.     ASSESSMENT:    1. Chest pain, unspecified type   2. Thoracic aortic aneurysm without rupture (HCC)    PLAN:    In order of problems listed above:  CAD: History of inferior STEMI and subsequent CABG in 2011. Patient with complaints of chest pain in 03/2016 with subsequent low risk Myoview. No further chest pain until Sunday when he had 2-3 hours of central chest pressure after drinking a large diet Coke with no associated symptoms. Patient exercises at cardiac rehabilitation maintenance program 2-4 times per week without any exertional symptoms. EKG is without changes. Heart sounds with gallop. No orthopnea or PND. I feel that the patient's discomfort was atypical and more GI related.  Will update echocardiogram in assessment for LV function. Continue aspirin, beta blocker, ACE-I, statin I have reviewed emergency symptoms to report and when to report to the emergency department for ongoing chest pain that does not respond to rest and/or nitroglycerin. He is given a refill for nitroglycerin.  Lower extremity edema: He has very mild edema of the left lower leg, worse at the end of the day and improved in the morning. He was diagnosed earlier this year with venous insufficiency. He has not been wearing compression stockings due to difficulty getting them on. I've advised him that he can get a size larger that would make then easier to don. We also discussed following a low-sodium  diet.  Hypertension: Blood pressure is well controlled on lisinopril 40 mg in the morning and 20 mg at night, Lopressor 50 mg twice a day. Patient is compliant with an tolerating medications well. Recent lab work at his PCP in 04/2016 showed normal renal function and potassium.  Carotid artery stenosis:  Stable bilateral internal carotid artery stenosis. Recommendation for 1 year follow-up, last study was in 03/2016. He is having no symptoms of lightheadedness, dizziness, syncope/near-syncope. He is due for repeat study and 03/2017.  HyperLipidemia: Recent lab work at his PCP shows LDL of 55 on 11/07/2016, at goal <70 on Simvastatin 40 mg daily.   Thoracic aortic aneurysm:  Stable by CT in 09/2015. Will arrange for follow-up CTA.  We will get basic metabolic panel prior to CTA to assess renal function.  Medication Adjustments/Labs and Tests Ordered: Current medicines are reviewed at length with the patient today.  Concerns regarding medicines are outlined above. Labs and tests ordered and medication changes are outlined in the patient instructions below:  Patient Instructions  Medication Instructions:  Your physician recommends that you continue on your current medications as directed. Please refer to the Current Medication list given to you today.   Labwork: TODAY:  BMET  Testing/Procedures: Non-Cardiac CT Angiography (CTA), is a special type of CT scan that uses a computer to produce multi-dimensional views of major blood vessels throughout the body. In CT angiography, a contrast material is injected through an IV to help visualize the blood vessels  Your physician has requested that you have an echocardiogram. Echocardiography is a painless test that uses sound waves to create images of your heart. It provides your doctor with information about the size and shape of your heart and how well your heart's chambers and valves are working.  This procedure takes approximately one hour. There are no  restrictions for this procedure.    Follow-Up: Your physician recommends that you schedule a follow-up appointment in: 4 MONTHS WITH DR. Angelena Form   Any Other Special Instructions Will Be Listed Below (If Applicable).  CT Scan A computed tomography (CT) scan is a specialized X-ray scan. It uses X-rays and a computer to make pictures of different areas of your body. A CT scan can offer more detailed information than a regular X-ray exam. The CT scan provides data about internal organs, soft tissue structures, blood vessels, and bones. The CT scanner is a large machine that takes pictures of your body as you move through the opening. Tell a health care provider about:  Any allergies you have.  All medicines you are taking, including vitamins, herbs, eye drops, creams, and over-the-counter medicines.  Any problems you or family members have had with anesthetic medicines.  Any blood disorders you have.  Any surgeries you have had.  Any medical conditions you have. What are the risks? Generally, this is a safe procedure. However, as with any procedure, problems can occur. Possible problems include:  An allergic reaction to the contrast material.  Development of cancer from excessive exposure to radiation. The risk of this is small.  What happens before the procedure?  The day before the test, stop drinking caffeinated beverages. These include energy drinks, tea, soda, coffee, and hot chocolate.  On the day of the test: ? About 4 hours before the test, stop eating and drinking anything but water as advised by your health care provider. ? Avoid wearing jewelry. You will have to partly or fully undress and wear a hospital gown. What happens during the procedure?  You will be asked to lie on a table with your arms above your head.  If contrast dye is to be used for the test, an IV tube will be inserted in your arm. The contrast dye will be injected into the IV tube. You might feel  warm, or you may get a metallic taste in your mouth.  The table you will be lying on will move into a large machine that will do the scanning.  You will be able to see, hear, and talk to the person running the machine while you are in it. Follow that person's directions.  The CT machine will move around you to take pictures. Do not move while it is scanning. This helps to get a good image.  When the best possible pictures have been taken, the machine will be turned off. The table will be moved out of the machine. The IV tube will then be removed. What happens after the procedure? Ask your health care provider when to follow up for your test results. This information is not intended to replace advice given to you by your health care provider. Make sure you discuss any questions you have with your health care provider. Document Released: 03/21/2004 Document Revised: 07/20/2015 Document Reviewed: 10/19/2012 Elsevier Interactive Patient Education  2017 [employer]  . Echocardiogram An echocardiogram, or echocardiography, uses sound waves (ultrasound) to produce an image of your heart. The echocardiogram is simple, painless, obtained within a short period of time, and offers valuable information to your health care provider. The images from an echocardiogram can provide information such as:  Evidence of coronary artery disease (CAD).  Heart size.  Heart muscle function.  Heart valve function.  Aneurysm detection.  Evidence of a past heart attack.  Fluid  buildup around the heart.  Heart muscle thickening.  Assess heart valve function.  Tell a health care provider about:  Any allergies you have.  All medicines you are taking, including vitamins, herbs, eye drops, creams, and over-the-counter medicines.  Any problems you or family members have had with anesthetic medicines.  Any blood disorders you have.  Any surgeries you have had.  Any medical conditions you  have.  Whether you are pregnant or may be pregnant. What happens before the procedure? No special preparation is needed. Eat and drink normally. What happens during the procedure?  In order to produce an image of your heart, gel will be applied to your chest and a wand-like tool (transducer) will be moved over your chest. The gel will help transmit the sound waves from the transducer. The sound waves will harmlessly bounce off your heart to allow the heart images to be captured in real-time motion. These images will then be recorded.  You may need an IV to receive a medicine that improves the quality of the pictures. What happens after the procedure? You may return to your normal schedule including diet, activities, and medicines, unless your health care provider tells you otherwise. This information is not intended to replace advice given to you by your health care provider. Make sure you discuss any questions you have with your health care provider. Document Released: 02/09/2000 Document Revised: 09/30/2015 Document Reviewed: 10/19/2012 Elsevier Interactive Patient Education  2017 [employer].   If you need a refill on your cardiac medications before your next appointment, please call your pharmacy.      Signed, Daune Perch, NP  11/26/2016 11:13 AM    [place]

## 2016-11-26 ENCOUNTER — Ambulatory Visit (INDEPENDENT_AMBULATORY_CARE_PROVIDER_SITE_OTHER): Payer: Medicare Other | Admitting: Cardiology

## 2016-11-26 ENCOUNTER — Encounter: Payer: Self-pay | Admitting: Cardiology

## 2016-11-26 VITALS — BP 130/86 | HR 78 | Ht 70.0 in | Wt 211.0 lb

## 2016-11-26 DIAGNOSIS — I10 Essential (primary) hypertension: Secondary | ICD-10-CM

## 2016-11-26 DIAGNOSIS — R609 Edema, unspecified: Secondary | ICD-10-CM

## 2016-11-26 DIAGNOSIS — E785 Hyperlipidemia, unspecified: Secondary | ICD-10-CM

## 2016-11-26 DIAGNOSIS — I712 Thoracic aortic aneurysm, without rupture, unspecified: Secondary | ICD-10-CM

## 2016-11-26 DIAGNOSIS — R079 Chest pain, unspecified: Secondary | ICD-10-CM | POA: Diagnosis not present

## 2016-11-26 DIAGNOSIS — I2510 Atherosclerotic heart disease of native coronary artery without angina pectoris: Secondary | ICD-10-CM

## 2016-11-26 DIAGNOSIS — I6523 Occlusion and stenosis of bilateral carotid arteries: Secondary | ICD-10-CM | POA: Diagnosis not present

## 2016-11-26 LAB — BASIC METABOLIC PANEL
BUN/Creatinine Ratio: 18 (ref 10–24)
BUN: 18 mg/dL (ref 8–27)
CALCIUM: 10.4 mg/dL — AB (ref 8.6–10.2)
CHLORIDE: 99 mmol/L (ref 96–106)
CO2: 25 mmol/L (ref 20–29)
Creatinine, Ser: 1 mg/dL (ref 0.76–1.27)
GFR calc non Af Amer: 74 mL/min/{1.73_m2} (ref >59)
GFR, EST AFRICAN AMERICAN: 86 mL/min/{1.73_m2} (ref >59)
GLUCOSE: 125 mg/dL — AB (ref 65–99)
Potassium: 4.9 mmol/L (ref 3.5–5.2)
Sodium: 139 mmol/L (ref 134–144)

## 2016-11-26 MED ORDER — NITROGLYCERIN 0.4 MG SL SUBL: 0.4000 mg | SUBLINGUAL_TABLET | SUBLINGUAL | 3 refills | Status: AC | PRN

## 2016-11-26 NOTE — Patient Instructions (Addendum)
Medication Instructions:  Your physician recommends that you continue on your current medications as directed. Please refer to the Current Medication list given to you today.   Labwork: TODAY:  BMET  Testing/Procedures: Non-Cardiac CT Angiography (CTA), is a special type of CT scan that uses a computer to produce multi-dimensional views of major blood vessels throughout the body. In CT angiography, a contrast material is injected through an IV to help visualize the blood vessels  Your physician has requested that you have an echocardiogram. Echocardiography is a painless test that uses sound waves to create images of your heart. It provides your doctor with information about the size and shape of your heart and how well your heart's chambers and valves are working. This procedure takes approximately one hour. There are no restrictions for this procedure.    Follow-Up: Your physician recommends that you schedule a follow-up appointment in: 4 MONTHS WITH DR. Angelena Form   Any Other Special Instructions Will Be Listed Below (If Applicable).  CT Scan A computed tomography (CT) scan is a specialized X-ray scan. It uses X-rays and a computer to make pictures of different areas of your body. A CT scan can offer more detailed information than a regular X-ray exam. The CT scan provides data about internal organs, soft tissue structures, blood vessels, and bones. The CT scanner is a large machine that takes pictures of your body as you move through the opening. Tell a health care provider about:  Any allergies you have.  All medicines you are taking, including vitamins, herbs, eye drops, creams, and over-the-counter medicines.  Any problems you or family members have had with anesthetic medicines.  Any blood disorders you have.  Any surgeries you have had.  Any medical conditions you have. What are the risks? Generally, this is a safe procedure. However, as with any procedure, problems can  occur. Possible problems include:  An allergic reaction to the contrast material.  Development of cancer from excessive exposure to radiation. The risk of this is small.  What happens before the procedure?  The day before the test, stop drinking caffeinated beverages. These include energy drinks, tea, soda, coffee, and hot chocolate.  On the day of the test: ? About 4 hours before the test, stop eating and drinking anything but water as advised by your health care provider. ? Avoid wearing jewelry. You will have to partly or fully undress and wear a hospital gown. What happens during the procedure?  You will be asked to lie on a table with your arms above your head.  If contrast dye is to be used for the test, an IV tube will be inserted in your arm. The contrast dye will be injected into the IV tube. You might feel warm, or you may get a metallic taste in your mouth.  The table you will be lying on will move into a large machine that will do the scanning.  You will be able to see, hear, and talk to the person running the machine while you are in it. Follow that person's directions.  The CT machine will move around you to take pictures. Do not move while it is scanning. This helps to get a good image.  When the best possible pictures have been taken, the machine will be turned off. The table will be moved out of the machine. The IV tube will then be removed. What happens after the procedure? Ask your health care provider when to follow up for your test results.  This information is not intended to replace advice given to you by your health care provider. Make sure you discuss any questions you have with your health care provider. Document Released: 03/21/2004 Document Revised: 07/20/2015 Document Reviewed: 10/19/2012 Elsevier Interactive Patient Education  2017 Elsevier Inc.  . Echocardiogram An echocardiogram, or echocardiography, uses sound waves (ultrasound) to produce an image  of your heart. The echocardiogram is simple, painless, obtained within a short period of time, and offers valuable information to your health care provider. The images from an echocardiogram can provide information such as:  Evidence of coronary artery disease (CAD).  Heart size.  Heart muscle function.  Heart valve function.  Aneurysm detection.  Evidence of a past heart attack.  Fluid buildup around the heart.  Heart muscle thickening.  Assess heart valve function.  Tell a health care provider about:  Any allergies you have.  All medicines you are taking, including vitamins, herbs, eye drops, creams, and over-the-counter medicines.  Any problems you or family members have had with anesthetic medicines.  Any blood disorders you have.  Any surgeries you have had.  Any medical conditions you have.  Whether you are pregnant or may be pregnant. What happens before the procedure? No special preparation is needed. Eat and drink normally. What happens during the procedure?  In order to produce an image of your heart, gel will be applied to your chest and a wand-like tool (transducer) will be moved over your chest. The gel will help transmit the sound waves from the transducer. The sound waves will harmlessly bounce off your heart to allow the heart images to be captured in real-time motion. These images will then be recorded.  You may need an IV to receive a medicine that improves the quality of the pictures. What happens after the procedure? You may return to your normal schedule including diet, activities, and medicines, unless your health care provider tells you otherwise. This information is not intended to replace advice given to you by your health care provider. Make sure you discuss any questions you have with your health care provider. Document Released: 02/09/2000 Document Revised: 09/30/2015 Document Reviewed: 10/19/2012 Elsevier Interactive Patient Education  2017  [employer].   If you need a refill on your cardiac medications before your next appointment, please call your pharmacy.

## 2016-11-27 ENCOUNTER — Encounter (HOSPITAL_COMMUNITY): Payer: Self-pay

## 2016-11-29 ENCOUNTER — Encounter (HOSPITAL_COMMUNITY): Payer: Self-pay

## 2016-12-02 ENCOUNTER — Encounter (HOSPITAL_COMMUNITY): Payer: Self-pay

## 2016-12-04 ENCOUNTER — Encounter (HOSPITAL_COMMUNITY): Payer: Self-pay

## 2016-12-05 ENCOUNTER — Other Ambulatory Visit: Payer: Self-pay

## 2016-12-05 ENCOUNTER — Ambulatory Visit (INDEPENDENT_AMBULATORY_CARE_PROVIDER_SITE_OTHER)
Admission: RE | Admit: 2016-12-05 | Discharge: 2016-12-05 | Disposition: A | Discharge status: Home / Self Care | Payer: Medicare Other | Source: Ambulatory Visit | Attending: Cardiology | Admitting: Cardiology

## 2016-12-05 ENCOUNTER — Telehealth: Payer: Self-pay

## 2016-12-05 ENCOUNTER — Ambulatory Visit (HOSPITAL_COMMUNITY): Payer: Medicare Other | Attending: Cardiovascular Disease

## 2016-12-05 DIAGNOSIS — I712 Thoracic aortic aneurysm, without rupture, unspecified: Secondary | ICD-10-CM

## 2016-12-05 DIAGNOSIS — I2510 Atherosclerotic heart disease of native coronary artery without angina pectoris: Secondary | ICD-10-CM | POA: Diagnosis present

## 2016-12-05 DIAGNOSIS — Z8249 Family history of ischemic heart disease and other diseases of the circulatory system: Secondary | ICD-10-CM | POA: Diagnosis not present

## 2016-12-05 DIAGNOSIS — I252 Old myocardial infarction: Secondary | ICD-10-CM | POA: Diagnosis not present

## 2016-12-05 DIAGNOSIS — R079 Chest pain, unspecified: Secondary | ICD-10-CM | POA: Diagnosis not present

## 2016-12-05 DIAGNOSIS — J45909 Unspecified asthma, uncomplicated: Secondary | ICD-10-CM | POA: Insufficient documentation

## 2016-12-05 DIAGNOSIS — Z951 Presence of aortocoronary bypass graft: Secondary | ICD-10-CM | POA: Diagnosis not present

## 2016-12-05 DIAGNOSIS — I493 Ventricular premature depolarization: Secondary | ICD-10-CM | POA: Diagnosis not present

## 2016-12-05 DIAGNOSIS — I119 Hypertensive heart disease without heart failure: Secondary | ICD-10-CM | POA: Insufficient documentation

## 2016-12-05 DIAGNOSIS — E785 Hyperlipidemia, unspecified: Secondary | ICD-10-CM | POA: Diagnosis not present

## 2016-12-05 MED ORDER — IOPAMIDOL (ISOVUE-370) INJECTION 76%
100.0000 mL | Freq: Once | INTRAVENOUS | Status: AC | PRN
  Administered 2016-12-05: 100 mL via INTRAVENOUS

## 2016-12-05 MED ORDER — PERFLUTREN LIPID MICROSPHERE
1.0000 mL | INTRAVENOUS | Status: AC | PRN
  Administered 2016-12-05: 2 mL via INTRAVENOUS

## 2016-12-05 NOTE — Telephone Encounter (Signed)
Received call from [place] at [facility] regarding recent CT angio. She wanted to bring attention to: 2. **An incidental finding of potential clinical significance has been found. Common bile duct dilated to approximately 10 mm on today's exam, although recent abdomen ultrasound measured the common bile duct to be upper limits of normal at 7 mm. There is now, however, questionable pancreatic duct dilatation as well. 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 Consider MRCP for more definitive characterization.**  To Daune Perch for review.  Full CT report in Imaging tab.

## 2016-12-06 ENCOUNTER — Encounter (HOSPITAL_COMMUNITY): Payer: Self-pay

## 2016-12-09 ENCOUNTER — Encounter (HOSPITAL_COMMUNITY): Payer: Self-pay

## 2016-12-11 ENCOUNTER — Encounter (HOSPITAL_COMMUNITY): Payer: Self-pay

## 2016-12-11 ENCOUNTER — Other Ambulatory Visit: Payer: Self-pay | Admitting: Gastroenterology

## 2016-12-11 DIAGNOSIS — R935 Abnormal findings on diagnostic imaging of other abdominal regions, including retroperitoneum: Secondary | ICD-10-CM

## 2016-12-13 ENCOUNTER — Encounter (HOSPITAL_COMMUNITY)
Admission: RE | Admit: 2016-12-13 | Discharge: 2016-12-13 | Disposition: A | Discharge status: Home / Self Care | Payer: Self-pay | Source: Ambulatory Visit | Attending: Cardiovascular Disease | Admitting: Cardiovascular Disease

## 2016-12-16 ENCOUNTER — Encounter (HOSPITAL_COMMUNITY)
Admission: RE | Admit: 2016-12-16 | Discharge: 2016-12-16 | Disposition: A | Discharge status: Home / Self Care | Payer: Self-pay | Source: Ambulatory Visit | Attending: Cardiovascular Disease | Admitting: Cardiovascular Disease

## 2016-12-18 ENCOUNTER — Encounter (HOSPITAL_COMMUNITY)
Admission: RE | Admit: 2016-12-18 | Discharge: 2016-12-18 | Disposition: A | Discharge status: Home / Self Care | Payer: Self-pay | Source: Ambulatory Visit | Attending: Cardiovascular Disease | Admitting: Cardiovascular Disease

## 2016-12-19 ENCOUNTER — Other Ambulatory Visit: Payer: Self-pay | Admitting: *Deleted

## 2016-12-19 DIAGNOSIS — I6523 Occlusion and stenosis of bilateral carotid arteries: Secondary | ICD-10-CM

## 2016-12-20 ENCOUNTER — Encounter (HOSPITAL_COMMUNITY): Payer: Self-pay

## 2016-12-20 ENCOUNTER — Ambulatory Visit
Admission: RE | Admit: 2016-12-20 | Discharge: 2016-12-20 | Disposition: A | Discharge status: Home / Self Care | Payer: Medicare Other | Source: Ambulatory Visit | Attending: Gastroenterology | Admitting: Gastroenterology

## 2016-12-20 DIAGNOSIS — R935 Abnormal findings on diagnostic imaging of other abdominal regions, including retroperitoneum: Secondary | ICD-10-CM

## 2016-12-20 MED ORDER — GADOBENATE DIMEGLUMINE 529 MG/ML IV SOLN
20.0000 mL | Freq: Once | INTRAVENOUS | Status: AC | PRN
  Administered 2016-12-20: 20 mL via INTRAVENOUS

## 2016-12-23 ENCOUNTER — Encounter (HOSPITAL_COMMUNITY): Payer: Self-pay

## 2016-12-25 ENCOUNTER — Encounter (HOSPITAL_COMMUNITY): Payer: Self-pay

## 2016-12-27 ENCOUNTER — Encounter (HOSPITAL_COMMUNITY): Payer: Self-pay

## 2016-12-30 ENCOUNTER — Telehealth: Payer: Self-pay | Admitting: Cardiology

## 2016-12-30 ENCOUNTER — Encounter (HOSPITAL_COMMUNITY)
Admission: RE | Admit: 2016-12-30 | Discharge: 2016-12-30 | Disposition: A | Discharge status: Home / Self Care | Payer: Self-pay | Source: Ambulatory Visit | Attending: Cardiovascular Disease | Admitting: Cardiovascular Disease

## 2016-12-30 DIAGNOSIS — I2510 Atherosclerotic heart disease of native coronary artery without angina pectoris: Secondary | ICD-10-CM | POA: Insufficient documentation

## 2016-12-30 NOTE — Telephone Encounter (Signed)
New Message:    Pt wants to know when is supposed to come for his next office visit?

## 2016-12-30 NOTE — Telephone Encounter (Signed)
Patient called to schedule follow-up. Per Janine's last OV note, patient was to be scheduled 4 months from October with Dr. Angelena Form.. Scheduled patient 04/07/17 with Dr. Angelena Form. Patient was grateful for call.

## 2017-01-01 ENCOUNTER — Encounter (HOSPITAL_COMMUNITY)
Admission: RE | Admit: 2017-01-01 | Discharge: 2017-01-01 | Disposition: A | Discharge status: Home / Self Care | Payer: Self-pay | Source: Ambulatory Visit | Attending: Cardiovascular Disease | Admitting: Cardiovascular Disease

## 2017-01-03 ENCOUNTER — Encounter (HOSPITAL_COMMUNITY): Payer: Self-pay

## 2017-01-06 ENCOUNTER — Encounter (HOSPITAL_COMMUNITY)
Admission: RE | Admit: 2017-01-06 | Discharge: 2017-01-06 | Disposition: A | Discharge status: Home / Self Care | Payer: Self-pay | Source: Ambulatory Visit | Attending: Cardiovascular Disease | Admitting: Cardiovascular Disease

## 2017-01-08 ENCOUNTER — Encounter (HOSPITAL_COMMUNITY)
Admission: RE | Admit: 2017-01-08 | Discharge: 2017-01-08 | Disposition: A | Discharge status: Home / Self Care | Payer: Self-pay | Source: Ambulatory Visit | Attending: Cardiovascular Disease | Admitting: Cardiovascular Disease

## 2017-01-10 ENCOUNTER — Encounter (HOSPITAL_COMMUNITY): Payer: Self-pay

## 2017-01-13 ENCOUNTER — Encounter (HOSPITAL_COMMUNITY): Payer: Self-pay

## 2017-01-15 ENCOUNTER — Encounter (HOSPITAL_COMMUNITY): Payer: Self-pay

## 2017-01-20 ENCOUNTER — Encounter (HOSPITAL_COMMUNITY): Payer: Self-pay

## 2017-01-22 ENCOUNTER — Encounter (HOSPITAL_COMMUNITY): Payer: Self-pay

## 2017-01-24 ENCOUNTER — Encounter (HOSPITAL_COMMUNITY): Payer: Self-pay

## 2017-01-29 ENCOUNTER — Encounter (HOSPITAL_COMMUNITY)
Admission: RE | Admit: 2017-01-29 | Discharge: 2017-01-29 | Disposition: A | Discharge status: Home / Self Care | Payer: Self-pay | Source: Ambulatory Visit | Attending: Cardiovascular Disease | Admitting: Cardiovascular Disease

## 2017-01-29 DIAGNOSIS — I2510 Atherosclerotic heart disease of native coronary artery without angina pectoris: Secondary | ICD-10-CM | POA: Insufficient documentation

## 2017-02-05 ENCOUNTER — Encounter (HOSPITAL_COMMUNITY): Payer: Self-pay

## 2017-02-07 ENCOUNTER — Encounter (HOSPITAL_COMMUNITY): Payer: Self-pay

## 2017-02-10 ENCOUNTER — Encounter (HOSPITAL_COMMUNITY): Payer: Self-pay

## 2017-02-12 ENCOUNTER — Encounter (HOSPITAL_COMMUNITY)
Admission: RE | Admit: 2017-02-12 | Discharge: 2017-02-12 | Disposition: A | Discharge status: Home / Self Care | Payer: Self-pay | Source: Ambulatory Visit | Attending: Cardiovascular Disease | Admitting: Cardiovascular Disease

## 2017-02-14 ENCOUNTER — Encounter (HOSPITAL_COMMUNITY): Payer: Self-pay

## 2017-02-19 ENCOUNTER — Encounter (HOSPITAL_COMMUNITY): Payer: Self-pay

## 2017-02-21 ENCOUNTER — Encounter (HOSPITAL_COMMUNITY): Payer: Self-pay

## 2017-02-24 ENCOUNTER — Encounter (HOSPITAL_COMMUNITY): Payer: Self-pay

## 2017-02-26 ENCOUNTER — Encounter (HOSPITAL_COMMUNITY)
Admission: RE | Admit: 2017-02-26 | Discharge: 2017-02-26 | Disposition: A | Discharge status: Home / Self Care | Payer: Self-pay | Source: Ambulatory Visit | Attending: Cardiovascular Disease | Admitting: Cardiovascular Disease

## 2017-02-26 DIAGNOSIS — I2510 Atherosclerotic heart disease of native coronary artery without angina pectoris: Secondary | ICD-10-CM | POA: Insufficient documentation

## 2017-02-28 ENCOUNTER — Encounter (HOSPITAL_COMMUNITY): Payer: Self-pay

## 2017-03-03 ENCOUNTER — Encounter (HOSPITAL_COMMUNITY)
Admission: RE | Admit: 2017-03-03 | Discharge: 2017-03-03 | Disposition: A | Discharge status: Home / Self Care | Payer: Self-pay | Source: Ambulatory Visit | Attending: Cardiovascular Disease | Admitting: Cardiovascular Disease

## 2017-03-05 ENCOUNTER — Encounter (HOSPITAL_COMMUNITY): Payer: Self-pay

## 2017-03-07 ENCOUNTER — Encounter (HOSPITAL_COMMUNITY)
Admission: RE | Admit: 2017-03-07 | Discharge: 2017-03-07 | Disposition: A | Discharge status: Home / Self Care | Payer: Medicare Other | Source: Ambulatory Visit | Attending: Cardiovascular Disease | Admitting: Cardiovascular Disease

## 2017-03-08 ENCOUNTER — Other Ambulatory Visit: Payer: Self-pay | Admitting: Cardiovascular Disease

## 2017-03-10 ENCOUNTER — Encounter (HOSPITAL_COMMUNITY)
Admission: RE | Admit: 2017-03-10 | Discharge: 2017-03-10 | Disposition: A | Discharge status: Home / Self Care | Payer: Self-pay | Source: Ambulatory Visit | Attending: Cardiovascular Disease | Admitting: Cardiovascular Disease

## 2017-03-12 ENCOUNTER — Encounter (HOSPITAL_COMMUNITY): Payer: Self-pay

## 2017-03-14 ENCOUNTER — Encounter (HOSPITAL_COMMUNITY)
Admission: RE | Admit: 2017-03-14 | Discharge: 2017-03-14 | Disposition: A | Discharge status: Home / Self Care | Payer: Self-pay | Source: Ambulatory Visit | Attending: Cardiovascular Disease | Admitting: Cardiovascular Disease

## 2017-03-17 ENCOUNTER — Encounter (HOSPITAL_COMMUNITY)
Admission: RE | Admit: 2017-03-17 | Discharge: 2017-03-17 | Disposition: A | Discharge status: Home / Self Care | Payer: Medicare Other | Source: Ambulatory Visit | Attending: Cardiovascular Disease | Admitting: Cardiovascular Disease

## 2017-03-19 ENCOUNTER — Encounter (HOSPITAL_COMMUNITY): Payer: Self-pay

## 2017-03-21 ENCOUNTER — Encounter (HOSPITAL_COMMUNITY): Payer: Self-pay

## 2017-03-24 ENCOUNTER — Encounter (HOSPITAL_COMMUNITY)
Admission: RE | Admit: 2017-03-24 | Discharge: 2017-03-24 | Disposition: A | Discharge status: Home / Self Care | Payer: Self-pay | Source: Ambulatory Visit | Attending: Cardiovascular Disease | Admitting: Cardiovascular Disease

## 2017-03-26 ENCOUNTER — Encounter (HOSPITAL_COMMUNITY): Payer: Self-pay

## 2017-03-28 ENCOUNTER — Encounter (HOSPITAL_COMMUNITY)
Admission: RE | Admit: 2017-03-28 | Discharge: 2017-03-28 | Disposition: A | Discharge status: Home / Self Care | Payer: Self-pay | Source: Ambulatory Visit | Attending: Cardiovascular Disease | Admitting: Cardiovascular Disease

## 2017-03-28 DIAGNOSIS — I2510 Atherosclerotic heart disease of native coronary artery without angina pectoris: Secondary | ICD-10-CM | POA: Insufficient documentation

## 2017-03-31 ENCOUNTER — Encounter (HOSPITAL_COMMUNITY)
Admission: RE | Admit: 2017-03-31 | Discharge: 2017-03-31 | Disposition: A | Discharge status: Home / Self Care | Payer: Self-pay | Source: Ambulatory Visit | Attending: Cardiovascular Disease | Admitting: Cardiovascular Disease

## 2017-04-02 ENCOUNTER — Encounter (HOSPITAL_COMMUNITY)
Admission: RE | Admit: 2017-04-02 | Discharge: 2017-04-02 | Disposition: A | Discharge status: Home / Self Care | Payer: Medicare Other | Source: Ambulatory Visit | Attending: Cardiovascular Disease | Admitting: Cardiovascular Disease

## 2017-04-04 ENCOUNTER — Encounter (HOSPITAL_COMMUNITY)
Admission: RE | Admit: 2017-04-04 | Discharge: 2017-04-04 | Disposition: A | Discharge status: Home / Self Care | Payer: Self-pay | Source: Ambulatory Visit | Attending: Cardiovascular Disease | Admitting: Cardiovascular Disease

## 2017-04-07 ENCOUNTER — Encounter (HOSPITAL_COMMUNITY): Payer: Self-pay

## 2017-04-07 ENCOUNTER — Ambulatory Visit: Payer: Medicare Other | Admitting: Cardiovascular Disease

## 2017-04-07 ENCOUNTER — Encounter: Payer: Self-pay | Admitting: Cardiovascular Disease

## 2017-04-07 VITALS — BP 118/80 | HR 70 | Ht 70.0 in | Wt 207.8 lb

## 2017-04-07 DIAGNOSIS — I712 Thoracic aortic aneurysm, without rupture, unspecified: Secondary | ICD-10-CM

## 2017-04-07 DIAGNOSIS — I10 Essential (primary) hypertension: Secondary | ICD-10-CM | POA: Diagnosis not present

## 2017-04-07 DIAGNOSIS — I2510 Atherosclerotic heart disease of native coronary artery without angina pectoris: Secondary | ICD-10-CM | POA: Diagnosis not present

## 2017-04-07 DIAGNOSIS — I6523 Occlusion and stenosis of bilateral carotid arteries: Secondary | ICD-10-CM | POA: Diagnosis not present

## 2017-04-07 NOTE — Patient Instructions (Signed)
Medication Instructions:  Your physician recommends that you continue on your current medications as directed. Please refer to the Current Medication list given to you today.   Labwork: Your physician recommends that you return for lab work in: October --prior to CTA.  (BMP)   Testing/Procedures: Your physician has requested that you have a carotid duplex. This test is an ultrasound of the carotid arteries in your neck. It looks at blood flow through these arteries that supply the brain with blood. Allow one hour for this exam. There are no restrictions or special instructions.  Non-Cardiac CT Angiography (CTA), is a special type of CT scan that uses a computer to produce multi-dimensional views of major blood vessels throughout the body. In CT angiography, a contrast material is injected through an IV to help visualize the blood vessels. To be done in October 2019  Follow-Up: Your physician recommends that you schedule a follow-up appointment in: 12 months. Please call our office in about 9 months to schedule this appointment    Any Other Special Instructions Will Be Listed Below (If Applicable).     If you need a refill on your cardiac medications before your next appointment, please call your pharmacy.

## 2017-04-07 NOTE — Progress Notes (Signed)
Chief Complaint  Patient presents with  . Coronary Artery Disease     History of Present Illness: 72 yo male with history of CAD, HLD, HTN, thoracic aortic aneurysm, carotid artery disease who is here today for cardiac follow up. He was admitted to [facility] 01/01/10 with an acute inferior STEMI. Emergent cardiac cath showed severe three vessel CAD. He underwent 3V CABG later that day with Dr. Cyndia Bent. (LIMA to LAD, SVG to OM, SVG to RCA). He was admitted to [facility] August 2013 for abdominal pain and felt to have ischemic colitis and his beta blocker dose was reduced. He also had some bradycardia. He was seen by Truitt Merle, NP in January 2014 and was doing well but c/o skipped beats. Event monitor with PVCs, NSR. Stress myoview without ischemia April 2014. BP much better on Lisinopril increased dose. He was seen by Cecilie Kicks, NP February 2017 with chest pain. Stress myoview February 2018 showed no ischemia. Echo October 2018 with normal LV function, LVEF=55-60%. No valve disease. Thoracic aortic aneurysm stable by CTA October 2018. Carotid dopplers with mild bilateral disease February 2018.   She is here today for follow up. The patient denies any chest pain, dyspnea, palpitations, lower extremity edema, orthopnea, PND, dizziness, near syncope or syncope. He feels great.   Primary Care Physician: Jani Gravel, MD  Past Medical History:  Diagnosis Date  . Anxiety   . Arthritis   . Asthma    exercise induced  . Back pain, chronic    gets injections in back  . CAD (coronary artery disease)    a.  prior inferior MI; s/p 3V CABG 01/01/10 (LIMA to LAD, SVG to OM, SVG to RCA- Dr.Bartle);  b. Last Myoview (05/2012):  Normal; EF 70% // c. Myoview 2/18: EF 60, no ischemia or infarction, Normal Study  . Carotid stenosis    a. Carotid US (01/2011):  Bilateral ICA 0-39% (repeat in 1 year).;  b.  Carotid US (0/8657): RICA 84-69%; LICA 6-29%. Follow up 1 year;  c.  Carotid US (2/16):  R 40-59%;  L 1-39%; FU 1 year.  Marland Kitchen Dysrhythmia    PVC's  . GERD (gastroesophageal reflux disease)   . Gout   . History of kidney stones    no problems with stones  . Hx of echocardiogram    Echo (01/2010):  EF 55-60%, ? Apical HK, mild BAE, atrial septal aneurysm.  Marland Kitchen Hyperlipidemia   . Hypertension   . Ischemic colitis ([place])    noted on CT back in August 2013 possible related to hypotension  . Pneumonia    x 2  . PUD (peptic ulcer disease) 1974  . PVC's (premature ventricular contractions)    Event monitor in 04/2012: NSR, PVCs.  . ST elevation myocardial infarction (STEMI) of inferior wall ([place]) 2011   s/p CABG 2011  . Thoracic aortic aneurysm ([place])     stable on CT 01/13/10;  Chest/abdominal CTA (11/2011):  3.9 x 3.6 cm prox descending thoracic aorta (stable)    Past Surgical History:  Procedure Laterality Date  . CARDIAC CATHETERIZATION    . CHEST TUBE INSERTION  1992   fx lt ribs-collapsed lung  . CORONARY ARTERY BYPASS GRAFT  11/11   X 3 VESSELS  . cyst removed from back of neck    . cyst removel on right buttocks - 2014    . EAR CYST EXCISION Left 09/02/2012   Procedure: EXCISION SEBACEOUS CYST LEFT POSTERIOR LEG;  Surgeon: Isabel Caprice  Hassell Done, MD;  Location: [facility];  Service: General;  Laterality: Left;  . Excision of deep left neck mass. SURGEON:  Leonides Sake. Lucia Gaskins, M.D.  Cramer.Melena  . EYE SURGERY  both cararacts  . HERNIA REPAIR    . INGUINAL HERNIA REPAIR Right 12/02/2012   Procedure: OPEN RIGHT INGUINAL HERINA;  Surgeon: Pedro Earls, MD;  Location: [facility];  Service: General;  Laterality: Right;  . INGUINAL HERNIA REPAIR Left 11/02/2014   Procedure: OPEN LEFT INGUINAL HERNIA REPAIR WITH MESH;  Surgeon: Johnathan Hausen, MD;  Location: [facility];  Service: General;  Laterality: Left;  . INSERTION OF MESH Right 12/02/2012   Procedure: INSERTION OF MESH;  Surgeon: Pedro Earls, MD;  Location: [facility];  Service: General;  Laterality: Right;  . LUMBAR  LAMINECTOMY/DECOMPRESSION MICRODISCECTOMY N/A 01/26/2015   Procedure: Lumbar 5-sacrum 1 decompression;  Surgeon: Phylliss Bob, MD;  Location: [facility];  Service: Orthopedics;  Laterality: N/A;  . TONSILLECTOMY      Current Outpatient Medications  Medication Sig Dispense Refill  . allopurinol (ZYLOPRIM) 300 MG tablet Take 300 mg by mouth every morning.     Marland Kitchen aspirin 81 MG tablet Take 81 mg by mouth daily.    . Cholecalciferol (VITAMIN D3) 3000 UNITS TABS Take 3,000 Units by mouth daily.     . clotrimazole-betamethasone (LOTRISONE) cream APPLY TOPICALLY AS NEEDED. APPLY TO AFFECTED AREA DAILY OR AS DIRECTED FOOT IRRITATTION  1  . dexlansoprazole (DEXILANT) 60 MG capsule Take 60 mg by mouth every morning.     Marland Kitchen lisinopril (PRINIVIL,ZESTRIL) 20 MG tablet Take 20 mg by mouth at bedtime.    Marland Kitchen lisinopril (PRINIVIL,ZESTRIL) 40 MG tablet Take 40 mg by mouth every morning.    . metoprolol tartrate (LOPRESSOR) 50 MG tablet TAKE 1 TABLET BY MOUTH TWICE A DAY 60 tablet 8  . Multiple Vitamin (MULTIVITAMIN WITH MINERALS) TABS tablet Take 1 tablet by mouth every morning.     . neomycin-bacitracin-polymyxin (NEOSPORIN) [phone] ointment Apply 1 application topically 2 (two) times daily as needed (FOR FOOT IRRITATION).     Marland Kitchen nitroGLYCERIN (NITROSTAT) 0.4 MG SL tablet Place 1 tablet (0.4 mg total) under the tongue every 5 (five) minutes as needed for chest pain. 25 tablet 3  . simvastatin (ZOCOR) 40 MG tablet Take 40 mg by mouth every morning.     . vitamin B-12 (CYANOCOBALAMIN) 500 MCG tablet Take 500 mcg by mouth daily.    . vitamin C (ASCORBIC ACID) 500 MG tablet Take 1,000 mg by mouth every morning.      No current facility-administered medications for this visit.     Allergies  Allergen Reactions  . Levaquin [Levofloxacin]     Other reaction(s): Other INSOMNIA INSOMNIA  . Clotrimazole Diarrhea    Social History   Socioeconomic History  . Marital status: Widowed    Spouse name: Not on file  .  Number of children: Not on file  . Years of education: Not on file  . Highest education level: Not on file  Social Needs  . Financial resource strain: Not on file  . Food insecurity - worry: Not on file  . Food insecurity - inability: Not on file  . Transportation needs - medical: Not on file  . Transportation needs - non-medical: Not on file  Occupational History  . Not on file  Tobacco Use  . Smoking status: Former Smoker    Last attempt to quit: 02/25/1974    Years since quitting: 43.1  .  Smokeless tobacco: Never Used  Substance and Sexual Activity  . Alcohol use: Yes    Comment: rarely  . Drug use: No  . Sexual activity: Not on file  Other Topics Concern  . Not on file  Social History Narrative  . Not on file    Family History  Problem Relation Age of Onset  . Heart failure Mother   . Pneumonia Father   . Lung cancer Sister   . Esophageal cancer Sister   . Pancreatic cancer Maternal Grandmother     Review of Systems:  As stated in the HPI and otherwise negative.   BP 118/80   Pulse 70   Ht 5\' 10"  (1.778 m)   Wt 207 lb 12.8 oz (94.3 kg)   SpO2 96%   BMI 29.82 kg/m   Physical Examination:  General: Well developed, well nourished, NAD  HEENT: OP clear, mucus membranes moist  SKIN: warm, dry. No rashes. Neuro: No focal deficits  Musculoskeletal: Muscle strength 5/5 all ext  Psychiatric: Mood and affect normal  Neck: No JVD, no carotid bruits, no thyromegaly, no lymphadenopathy.  Lungs:Clear bilaterally, no wheezes, rhonci, crackles Cardiovascular: Regular rate and rhythm. No murmurs, gallops or rubs. Abdomen:Soft. Bowel sounds present. Non-tender.  Extremities: No lower extremity edema. Pulses are 2 + in the bilateral DP/PT.  Echo October 2018: - Left ventricle: The cavity size was normal. Systolic function was   normal. The estimated ejection fraction was in the range of 55%   to 60%. Wall motion was normal; there were no regional wall   motion  abnormalities. Doppler parameters are consistent with   abnormal left ventricular relaxation (grade 1 diastolic   dysfunction). Acoustic contrast opacification revealed no   evidence ofthrombus. - Left atrium: The atrium was mildly dilated. - Atrial septum: There was no atrial level shunt.  EKG:  EKG is not  ordered today. The ekg ordered today demonstrates   Recent Labs: 11/26/2016: BUN 18; Creatinine, Ser 1.00; Potassium 4.9; Sodium 139   Lipid Panel No results found for: CHOL, TRIG, HDL, CHOLHDL, VLDL, LDLCALC, LDLDIRECT   Wt Readings from Last 3 Encounters:  04/07/17 207 lb 12.8 oz (94.3 kg)  11/26/16 211 lb (95.7 kg)  04/16/16 206 lb (93.4 kg)     Other studies Reviewed: Additional studies/ records that were reviewed today include: . Review of the above records demonstrates:    Assessment and Plan:   1. CAD without angina: No chest pain suggestive of angina. Stress test February 2018 with no ischemia. Will continue ASA, statin and beta blocker.    2. HTN: BP is controlled. No changes today  3. HLD: Lipids followed in primary care. Continue statin.   4. Thoracic aortic aneurysm: CTA October 2018 with stable aneurysm. Will repeat in October 2019.  5. PVCs: Continue beta blocker.   6. Carotid artery disease: Mild to moderate disease. Repeat carotid dopplers now   Current medicines are reviewed at length with the patient today.  The patient does not have concerns regarding medicines.  The following changes have been made:  no change  Labs/ tests ordered today include:   Orders Placed This Encounter  Procedures  . CT ANGIO CHEST AORTA W &/OR WO CONTRAST  . Basic Metabolic Panel (BMET)    Disposition:   FU with me in 1 year  Signed, Lauree Chandler, MD 04/07/2017 3:57 PM    [facility], [place], [place]  [address] Phone: [phone]; Fax: [phone]   

## 2017-04-09 ENCOUNTER — Encounter (HOSPITAL_COMMUNITY)
Admission: RE | Admit: 2017-04-09 | Discharge: 2017-04-09 | Disposition: A | Discharge status: Home / Self Care | Payer: Self-pay | Source: Ambulatory Visit | Attending: Cardiovascular Disease | Admitting: Cardiovascular Disease

## 2017-04-11 ENCOUNTER — Encounter (HOSPITAL_COMMUNITY): Payer: Self-pay

## 2017-04-14 ENCOUNTER — Encounter (HOSPITAL_COMMUNITY)
Admission: RE | Admit: 2017-04-14 | Discharge: 2017-04-14 | Disposition: A | Discharge status: Home / Self Care | Payer: Self-pay | Source: Ambulatory Visit | Attending: Cardiovascular Disease | Admitting: Cardiovascular Disease

## 2017-04-16 ENCOUNTER — Encounter (HOSPITAL_COMMUNITY)
Admission: RE | Admit: 2017-04-16 | Discharge: 2017-04-16 | Disposition: A | Discharge status: Home / Self Care | Payer: Self-pay | Source: Ambulatory Visit | Attending: Cardiovascular Disease | Admitting: Cardiovascular Disease

## 2017-04-18 ENCOUNTER — Encounter (HOSPITAL_COMMUNITY): Payer: Self-pay

## 2017-04-21 ENCOUNTER — Encounter (HOSPITAL_COMMUNITY): Payer: Self-pay

## 2017-04-23 ENCOUNTER — Encounter (HOSPITAL_COMMUNITY): Payer: Self-pay

## 2017-04-24 ENCOUNTER — Ambulatory Visit (HOSPITAL_COMMUNITY)
Admission: RE | Admit: 2017-04-24 | Payer: Medicare Other | Source: Ambulatory Visit | Attending: Cardiovascular Disease | Admitting: Cardiovascular Disease

## 2017-04-25 ENCOUNTER — Encounter (HOSPITAL_COMMUNITY): Payer: Self-pay

## 2017-04-25 DIAGNOSIS — I2510 Atherosclerotic heart disease of native coronary artery without angina pectoris: Secondary | ICD-10-CM | POA: Insufficient documentation

## 2017-04-28 ENCOUNTER — Encounter (HOSPITAL_COMMUNITY): Payer: Self-pay

## 2017-04-28 ENCOUNTER — Other Ambulatory Visit (HOSPITAL_COMMUNITY): Payer: Self-pay | Admitting: Radiology

## 2017-04-30 ENCOUNTER — Encounter (HOSPITAL_COMMUNITY): Payer: Self-pay

## 2017-05-02 ENCOUNTER — Encounter (HOSPITAL_COMMUNITY): Payer: Self-pay

## 2017-05-05 ENCOUNTER — Encounter (HOSPITAL_COMMUNITY)
Admission: RE | Admit: 2017-05-05 | Discharge: 2017-05-05 | Disposition: A | Discharge status: Home / Self Care | Payer: Self-pay | Source: Ambulatory Visit | Attending: Cardiovascular Disease | Admitting: Cardiovascular Disease

## 2017-05-07 ENCOUNTER — Encounter (HOSPITAL_COMMUNITY)
Admission: RE | Admit: 2017-05-07 | Discharge: 2017-05-07 | Disposition: A | Discharge status: Home / Self Care | Payer: Self-pay | Source: Ambulatory Visit | Attending: Cardiovascular Disease | Admitting: Cardiovascular Disease

## 2017-05-09 ENCOUNTER — Encounter (HOSPITAL_COMMUNITY): Payer: Self-pay

## 2017-05-12 ENCOUNTER — Encounter (HOSPITAL_COMMUNITY)
Admission: RE | Admit: 2017-05-12 | Discharge: 2017-05-12 | Disposition: A | Discharge status: Home / Self Care | Payer: Self-pay | Source: Ambulatory Visit | Attending: Cardiovascular Disease | Admitting: Cardiovascular Disease

## 2017-05-14 ENCOUNTER — Encounter (HOSPITAL_COMMUNITY): Payer: Self-pay

## 2017-05-15 ENCOUNTER — Ambulatory Visit (HOSPITAL_COMMUNITY)
Admission: RE | Admit: 2017-05-15 | Discharge: 2017-05-15 | Disposition: A | Discharge status: Home / Self Care | Payer: Medicare Other | Source: Ambulatory Visit | Attending: Cardiovascular Disease | Admitting: Cardiovascular Disease

## 2017-05-15 DIAGNOSIS — I6523 Occlusion and stenosis of bilateral carotid arteries: Secondary | ICD-10-CM | POA: Diagnosis not present

## 2017-05-16 ENCOUNTER — Encounter (HOSPITAL_COMMUNITY)
Admission: RE | Admit: 2017-05-16 | Discharge: 2017-05-16 | Disposition: A | Discharge status: Home / Self Care | Payer: Self-pay | Source: Ambulatory Visit | Attending: Cardiovascular Disease | Admitting: Cardiovascular Disease

## 2017-05-19 ENCOUNTER — Encounter (HOSPITAL_COMMUNITY): Payer: Self-pay

## 2017-05-21 ENCOUNTER — Encounter (HOSPITAL_COMMUNITY): Payer: Self-pay

## 2017-05-23 ENCOUNTER — Encounter (HOSPITAL_COMMUNITY): Payer: Self-pay

## 2017-05-26 ENCOUNTER — Encounter (HOSPITAL_COMMUNITY)
Admission: RE | Admit: 2017-05-26 | Discharge: 2017-05-26 | Disposition: A | Discharge status: Home / Self Care | Payer: Self-pay | Source: Ambulatory Visit | Attending: Cardiovascular Disease | Admitting: Cardiovascular Disease

## 2017-05-26 DIAGNOSIS — I2510 Atherosclerotic heart disease of native coronary artery without angina pectoris: Secondary | ICD-10-CM | POA: Insufficient documentation

## 2017-05-28 ENCOUNTER — Encounter (HOSPITAL_COMMUNITY): Payer: Self-pay

## 2017-05-30 ENCOUNTER — Encounter (HOSPITAL_COMMUNITY): Payer: Self-pay

## 2017-06-02 ENCOUNTER — Encounter (HOSPITAL_COMMUNITY): Payer: Self-pay

## 2017-06-04 ENCOUNTER — Encounter (HOSPITAL_COMMUNITY)
Admission: RE | Admit: 2017-06-04 | Discharge: 2017-06-04 | Disposition: A | Discharge status: Home / Self Care | Payer: Self-pay | Source: Ambulatory Visit | Attending: Cardiovascular Disease | Admitting: Cardiovascular Disease

## 2017-06-06 ENCOUNTER — Encounter (HOSPITAL_COMMUNITY)
Admission: RE | Admit: 2017-06-06 | Discharge: 2017-06-06 | Disposition: A | Discharge status: Home / Self Care | Payer: Self-pay | Source: Ambulatory Visit | Attending: Cardiovascular Disease | Admitting: Cardiovascular Disease

## 2017-06-06 ENCOUNTER — Telehealth: Payer: Self-pay | Admitting: Cardiovascular Disease

## 2017-06-06 NOTE — Telephone Encounter (Signed)
I spoke with pt who reports he was told by PCP that his liver function tests were elevated but he was not instructed to hold statin. He is asking if Dr. Angelena Form thinks he needs to hold this. Lab results are scanned in chart.  I reviewed with Dr. Angelena Form and based on these results pt does not need to hold statin. I gave pt this information

## 2017-06-06 NOTE — Telephone Encounter (Signed)
New Message  Pt c/o medication issue:  1. Name of Medication: simvastatin (ZOCOR) 40 MG tablet  2. How are you currently taking this medication (dosage and times per day)? Take 40 mg by mouth every morning.   3. Are you having a reaction (difficulty breathing--STAT)?   4. What is your medication issue? Pt states that his labs he had with his pcp , sdtp is high for liver enzymes and he wants to know if he can stop his cholesterol meds until his next lab

## 2017-06-06 NOTE — Telephone Encounter (Signed)
Agree with holding statin if LFTs are abnormal. cdm

## 2017-06-09 ENCOUNTER — Encounter (HOSPITAL_COMMUNITY): Payer: Self-pay

## 2017-06-11 ENCOUNTER — Encounter (HOSPITAL_COMMUNITY)
Admission: RE | Admit: 2017-06-11 | Discharge: 2017-06-11 | Disposition: A | Discharge status: Home / Self Care | Payer: Self-pay | Source: Ambulatory Visit | Attending: Cardiovascular Disease | Admitting: Cardiovascular Disease

## 2017-06-13 ENCOUNTER — Encounter (HOSPITAL_COMMUNITY): Payer: Self-pay

## 2017-06-16 ENCOUNTER — Encounter (HOSPITAL_COMMUNITY): Payer: Self-pay

## 2017-06-18 ENCOUNTER — Encounter (HOSPITAL_COMMUNITY)
Admission: RE | Admit: 2017-06-18 | Discharge: 2017-06-18 | Disposition: A | Discharge status: Home / Self Care | Payer: Medicare Other | Source: Ambulatory Visit | Attending: Cardiovascular Disease | Admitting: Cardiovascular Disease

## 2017-06-20 ENCOUNTER — Encounter (HOSPITAL_COMMUNITY)
Admission: RE | Admit: 2017-06-20 | Discharge: 2017-06-20 | Disposition: A | Discharge status: Home / Self Care | Payer: Medicare Other | Source: Ambulatory Visit | Attending: Cardiovascular Disease | Admitting: Cardiovascular Disease

## 2017-06-23 ENCOUNTER — Encounter (HOSPITAL_COMMUNITY)
Admission: RE | Admit: 2017-06-23 | Discharge: 2017-06-23 | Disposition: A | Discharge status: Home / Self Care | Payer: Self-pay | Source: Ambulatory Visit | Attending: Cardiovascular Disease | Admitting: Cardiovascular Disease

## 2017-06-25 ENCOUNTER — Encounter (HOSPITAL_COMMUNITY): Payer: Self-pay | Attending: Cardiovascular Disease

## 2017-06-25 DIAGNOSIS — I2510 Atherosclerotic heart disease of native coronary artery without angina pectoris: Secondary | ICD-10-CM | POA: Insufficient documentation

## 2017-06-27 ENCOUNTER — Encounter (HOSPITAL_COMMUNITY): Payer: Self-pay

## 2017-06-30 ENCOUNTER — Encounter (HOSPITAL_COMMUNITY): Payer: Self-pay

## 2017-07-02 ENCOUNTER — Encounter (HOSPITAL_COMMUNITY): Payer: Self-pay

## 2017-07-04 ENCOUNTER — Encounter (HOSPITAL_COMMUNITY): Payer: Self-pay

## 2017-07-07 ENCOUNTER — Encounter (HOSPITAL_COMMUNITY): Payer: Self-pay

## 2017-07-09 ENCOUNTER — Encounter (HOSPITAL_COMMUNITY): Payer: Self-pay

## 2017-07-09 ENCOUNTER — Telehealth: Payer: Self-pay | Admitting: Cardiovascular Disease

## 2017-07-09 NOTE — Telephone Encounter (Signed)
New Message   Pt states she is doing ok but he wants a call back because he has some very important and confidential information he would like to share witht he nurse. Please call

## 2017-07-09 NOTE — Telephone Encounter (Signed)
I spoke with pt who reports he is feeling fine.  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  Had for about 3 months prior to being diagnosed.  Is currently being treated for this.

## 2017-07-11 ENCOUNTER — Encounter (HOSPITAL_COMMUNITY): Payer: Self-pay

## 2017-07-14 ENCOUNTER — Encounter (HOSPITAL_COMMUNITY): Payer: Self-pay

## 2017-07-14 ENCOUNTER — Telehealth (HOSPITAL_COMMUNITY): Payer: Self-pay | Admitting: *Deleted

## 2017-07-14 NOTE — Telephone Encounter (Signed)
Pt left message.  Message returned.  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  Pt unable to take antibiotic due to allergic reaction. Pt has been dealing with this since February. Pt has upcoming appt with Infectious Disease for 5/28.  Pt was hopeful that he could get in sooner since stopping the steroids his ear is red, swollen and tender.  Pt wishes to withdraw from the maintenance program and return when he is cleared to participate in group exercise. Asked pt to please keep rehab updated on how he is doing. Cherre Huger, BSN Cardiac and Training and development officer

## 2017-07-16 ENCOUNTER — Encounter (HOSPITAL_COMMUNITY): Payer: Self-pay

## 2017-07-17 ENCOUNTER — Encounter (HOSPITAL_COMMUNITY): Payer: Self-pay | Admitting: Emergency Medicine

## 2017-07-17 ENCOUNTER — Emergency Department (HOSPITAL_COMMUNITY): Payer: Medicare Other

## 2017-07-17 ENCOUNTER — Encounter (HOSPITAL_COMMUNITY): Payer: Self-pay

## 2017-07-17 ENCOUNTER — Emergency Department (HOSPITAL_COMMUNITY)
Admission: EM | Admit: 2017-07-17 | Discharge: 2017-07-17 | Disposition: A | Discharge status: Home / Self Care | Payer: Medicare Other | Attending: Emergency Medicine | Admitting: Emergency Medicine

## 2017-07-17 ENCOUNTER — Emergency Department (HOSPITAL_COMMUNITY)
Admission: EM | Admit: 2017-07-17 | Discharge: 2017-07-17 | Disposition: A | Discharge status: Home / Self Care | Payer: Medicare Other | Source: Home / Self Care | Attending: Emergency Medicine | Admitting: Emergency Medicine

## 2017-07-17 ENCOUNTER — Other Ambulatory Visit: Payer: Self-pay

## 2017-07-17 DIAGNOSIS — J45909 Unspecified asthma, uncomplicated: Secondary | ICD-10-CM | POA: Insufficient documentation

## 2017-07-17 DIAGNOSIS — X58XXXA Exposure to other specified factors, initial encounter: Secondary | ICD-10-CM | POA: Diagnosis not present

## 2017-07-17 DIAGNOSIS — Z951 Presence of aortocoronary bypass graft: Secondary | ICD-10-CM | POA: Diagnosis not present

## 2017-07-17 DIAGNOSIS — I2510 Atherosclerotic heart disease of native coronary artery without angina pectoris: Secondary | ICD-10-CM | POA: Diagnosis not present

## 2017-07-17 DIAGNOSIS — Y929 Unspecified place or not applicable: Secondary | ICD-10-CM | POA: Insufficient documentation

## 2017-07-17 DIAGNOSIS — Z79899 Other long term (current) drug therapy: Secondary | ICD-10-CM | POA: Insufficient documentation

## 2017-07-17 DIAGNOSIS — Y999 Unspecified external cause status: Secondary | ICD-10-CM | POA: Insufficient documentation

## 2017-07-17 DIAGNOSIS — H6011 Cellulitis of right external ear: Secondary | ICD-10-CM | POA: Diagnosis not present

## 2017-07-17 DIAGNOSIS — Y939 Activity, unspecified: Secondary | ICD-10-CM | POA: Diagnosis not present

## 2017-07-17 DIAGNOSIS — Z7982 Long term (current) use of aspirin: Secondary | ICD-10-CM | POA: Diagnosis not present

## 2017-07-17 DIAGNOSIS — R21 Rash and other nonspecific skin eruption: Secondary | ICD-10-CM | POA: Insufficient documentation

## 2017-07-17 DIAGNOSIS — S161XXA Strain of muscle, fascia and tendon at neck level, initial encounter: Secondary | ICD-10-CM | POA: Diagnosis not present

## 2017-07-17 DIAGNOSIS — R600 Localized edema: Secondary | ICD-10-CM | POA: Diagnosis present

## 2017-07-17 DIAGNOSIS — I252 Old myocardial infarction: Secondary | ICD-10-CM | POA: Insufficient documentation

## 2017-07-17 DIAGNOSIS — Z87891 Personal history of nicotine dependence: Secondary | ICD-10-CM | POA: Diagnosis not present

## 2017-07-17 DIAGNOSIS — I10 Essential (primary) hypertension: Secondary | ICD-10-CM | POA: Diagnosis not present

## 2017-07-17 DIAGNOSIS — Z760 Encounter for issue of repeat prescription: Secondary | ICD-10-CM | POA: Insufficient documentation

## 2017-07-17 LAB — BASIC METABOLIC PANEL
Anion gap: 9 (ref 5–15)
BUN: 31 mg/dL — AB (ref 6–20)
CALCIUM: 10.3 mg/dL (ref 8.9–10.3)
CHLORIDE: 103 mmol/L (ref 101–111)
CO2: 27 mmol/L (ref 22–32)
CREATININE: 1.15 mg/dL (ref 0.61–1.24)
GFR calc Af Amer: >60 mL/min (ref >60)
GFR calc non Af Amer: >60 mL/min (ref >60)
Glucose, Bld: 185 mg/dL — ABNORMAL HIGH (ref 65–99)
Potassium: 4.3 mmol/L (ref 3.5–5.1)
SODIUM: 139 mmol/L (ref 135–145)

## 2017-07-17 LAB — CBC WITH DIFFERENTIAL/PLATELET
Abs Immature Granulocytes: 0.1 10*3/uL (ref 0.0–0.1)
BASOS ABS: 0.1 10*3/uL (ref 0.0–0.1)
Basophils Relative: 1 %
EOS ABS: 0.1 10*3/uL (ref 0.0–0.7)
EOS PCT: 1 %
HCT: 46.9 % (ref 39.0–52.0)
Hemoglobin: 15.7 g/dL (ref 13.0–17.0)
Immature Granulocytes: 1 %
Lymphocytes Relative: 10 %
Lymphs Abs: 1.3 10*3/uL (ref 0.7–4.0)
MCH: 32.6 pg (ref 26.0–34.0)
MCHC: 33.5 g/dL (ref 30.0–36.0)
MCV: 97.5 fL (ref 78.0–100.0)
MONO ABS: 1.6 10*3/uL — AB (ref 0.1–1.0)
Monocytes Relative: 13 %
Neutro Abs: 9.2 10*3/uL — ABNORMAL HIGH (ref 1.7–7.7)
Neutrophils Relative %: 74 %
PLATELETS: 191 10*3/uL (ref 150–400)
RBC: 4.81 MIL/uL (ref 4.22–5.81)
RDW: 13.3 % (ref 11.5–15.5)
WBC: 12.4 10*3/uL — ABNORMAL HIGH (ref 4.0–10.5)

## 2017-07-17 LAB — TROPONIN I: Troponin I: <0.03 ng/mL (ref <0.03)

## 2017-07-17 LAB — BRAIN NATRIURETIC PEPTIDE: B Natriuretic Peptide: 56.8 pg/mL (ref 0.0–100.0)

## 2017-07-17 LAB — I-STAT CG4 LACTIC ACID, ED: LACTIC ACID, VENOUS: 2.31 mmol/L — AB (ref 0.5–1.9)

## 2017-07-17 MED ORDER — DOXYCYCLINE HYCLATE 100 MG PO CAPS: 100.0000 mg | ORAL_CAPSULE | Freq: Two times a day (BID) | ORAL | 0 refills | Status: DC

## 2017-07-17 MED ORDER — DIAZEPAM 2 MG PO TABS: 2.0000 mg | ORAL_TABLET | Freq: Three times a day (TID) | ORAL | 0 refills | Status: DC | PRN

## 2017-07-17 MED ORDER — ACETAMINOPHEN 325 MG PO TABS
650.0000 mg | ORAL_TABLET | ORAL | Status: DC | PRN
  Administered 2017-07-17: 650 mg via ORAL
  Filled 2017-07-17: qty 2

## 2017-07-17 MED ORDER — DIAZEPAM 2 MG PO TABS
2.0000 mg | ORAL_TABLET | Freq: Once | ORAL | Status: AC
  Administered 2017-07-17: 2 mg via ORAL
  Filled 2017-07-17: qty 1

## 2017-07-17 NOTE — ED Provider Notes (Signed)
[facility] EMERGENCY DEPARTMENT Provider Note   CSN: [id number] Arrival date & time: 07/17/17  3474     History   Chief Complaint Chief Complaint  Patient presents with  . Torticollis  . Wound Infection    HPI Samuel Coleman is a 72 y.o. male.  HPI Patient has been treated for right ear cellulitis for the past 3 months.  He completed 2 courses of doxycycline, 1 course of Levaquin and one course of clindamycin.  States he continues to have some mild swelling and tenderness to the ear.  Also for the last 2 days has developed some neck stiffness and pain and pain with swallowing.  Is also is concerned he may have rash to bilateral upper extremities and chest.  He has had no fever or chills.  No nausea or vomiting.  No headache, focal weakness or numbness.  No chest pain.  Patient does endorse some mild shortness of breath with exertion. Past Medical History:  Diagnosis Date  . Anxiety   . Arthritis   . Asthma    exercise induced  . Back pain, chronic    gets injections in back  . CAD (coronary artery disease)    a.  prior inferior MI; s/p 3V CABG 01/01/10 (LIMA to LAD, SVG to OM, SVG to RCA- Dr.Bartle);  b. Last Myoview (05/2012):  Normal; EF 70% // c. Myoview 2/18: EF 60, no ischemia or infarction, Normal Study  . Carotid stenosis    a. Carotid US (01/2011):  Bilateral ICA 0-39% (repeat in 1 year).;  b.  Carotid US (03/5954): RICA 38-75%; LICA 6-43%. Follow up 1 year;  c.  Carotid US (2/16):  R 40-59%; L 1-39%; FU 1 year.  Marland Kitchen Dysrhythmia    PVC's  . GERD (gastroesophageal reflux disease)   . Gout   . History of kidney stones    no problems with stones  . Hx of echocardiogram    Echo (01/2010):  EF 55-60%, ? Apical HK, mild BAE, atrial septal aneurysm.  Marland Kitchen Hyperlipidemia   . Hypertension   . Ischemic colitis ([place])    noted on CT back in August 2013 possible related to hypotension  . Pneumonia    x 2  . PUD (peptic ulcer disease) 1974  . PVC's  (premature ventricular contractions)    Event monitor in 04/2012: NSR, PVCs.  . ST elevation myocardial infarction (STEMI) of inferior wall ([place]) 2011   s/p CABG 2011  . Thoracic aortic aneurysm ([place])     stable on CT 01/13/10;  Chest/abdominal CTA (11/2011):  3.9 x 3.6 cm prox descending thoracic aorta (stable)    Patient Active Problem List   Diagnosis Date Noted  . S/P left inguinal hernia repair 11/02/2014  . Carotid artery disease ([place]) 03/29/2013  . S/P right inguinal hernia repair 12/16/2012  . Chest pain 12/07/2011  . Abdominal pain, other specified site 12/07/2011  . Other and unspecified noninfectious gastroenteritis and colitis(558.9) 10/05/2011  . 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 Right inguinal hernia-scrotum 08/23/2011  . Carotid bruit 01/16/2011  . Coronary artery disease involving native coronary artery of native heart without angina pectoris 07/19/2010  . Hyperlipidemia 01/15/2010  . GOUT 01/15/2010  . Essential hypertension 01/15/2010  . Aneurysm of thoracic aorta ([place]) 01/15/2010    Past Surgical History:  Procedure Laterality Date  . CARDIAC CATHETERIZATION    . CHEST TUBE INSERTION  1992   fx lt ribs-collapsed lung  . CORONARY ARTERY BYPASS GRAFT  11/11   X 3 VESSELS  . cyst removed from back of neck    . cyst removel on right buttocks - 2014    . EAR CYST EXCISION Left 09/02/2012   Procedure: EXCISION SEBACEOUS CYST LEFT POSTERIOR LEG;  Surgeon: Pedro Earls, MD;  Location: [place];  Service: General;  Laterality: Left;  . Excision of deep left neck mass. SURGEON:  Leonides Sake. Lucia Gaskins, M.D.  Cramer.Melena  . EYE SURGERY  both cararacts  . HERNIA REPAIR    . INGUINAL HERNIA REPAIR Right 12/02/2012   Procedure: OPEN RIGHT INGUINAL HERINA;  Surgeon: Pedro Earls, MD;  Location: [facility];  Service: General;  Laterality: Right;  . INGUINAL HERNIA REPAIR Left 11/02/2014   Procedure: OPEN LEFT INGUINAL HERNIA REPAIR WITH MESH;  Surgeon:  Johnathan Hausen, MD;  Location: [facility];  Service: General;  Laterality: Left;  . INSERTION OF MESH Right 12/02/2012   Procedure: INSERTION OF MESH;  Surgeon: Pedro Earls, MD;  Location: [facility];  Service: General;  Laterality: Right;  . LUMBAR LAMINECTOMY/DECOMPRESSION MICRODISCECTOMY N/A 01/26/2015   Procedure: Lumbar 5-sacrum 1 decompression;  Surgeon: Phylliss Bob, MD;  Location: [facility];  Service: Orthopedics;  Laterality: N/A;  . TONSILLECTOMY          Home Medications    Prior to Admission medications   Medication Sig Start Date End Date Taking? Authorizing Provider  allopurinol (ZYLOPRIM) 300 MG tablet Take 300 mg by mouth every morning.    Yes [provider]  Ascorbic Acid (VITAMIN C) 1000 MG tablet Take 1,000 mg by mouth every morning.    Yes [provider]  aspirin 81 MG tablet Take 81 mg by mouth daily.   Yes [provider]  Cholecalciferol (VITAMIN D3) 3000 UNITS TABS Take 3,000 Units by mouth daily.    Yes [provider]  clotrimazole-betamethasone (LOTRISONE) cream APPLY TOPICALLY AS NEEDED. APPLY TO AFFECTED AREA DAILY OR AS DIRECTED FOOT IRRITATTION 03/21/16  Yes [provider]  dexlansoprazole (DEXILANT) 60 MG capsule Take 60 mg by mouth every morning.    Yes [provider]  lisinopril (PRINIVIL,ZESTRIL) 20 MG tablet Take 20 mg by mouth at bedtime.   Yes [provider]  lisinopril (PRINIVIL,ZESTRIL) 40 MG tablet Take 40 mg by mouth every morning.   Yes [provider]  metoprolol tartrate (LOPRESSOR) 50 MG tablet TAKE 1 TABLET BY MOUTH TWICE A DAY 03/10/17  Yes [name], MD  Multiple Vitamin (MULTIVITAMIN WITH MINERALS) TABS tablet Take 1 tablet by mouth every morning.    Yes [provider]  neomycin-bacitracin-polymyxin (NEOSPORIN) [phone] ointment Apply 1 application topically 2 (two) times daily as needed (FOR FOOT IRRITATION).    Yes [provider]    nitroGLYCERIN (NITROSTAT) 0.4 MG SL tablet Place 1 tablet (0.4 mg total) under the tongue every 5 (five) minutes as needed for chest pain. 11/26/16  Yes Daune Perch, NP  simvastatin (ZOCOR) 40 MG tablet Take 40 mg by mouth every morning.    Yes [provider]  vitamin B-12 (CYANOCOBALAMIN) 500 MCG tablet Take 500 mcg by mouth daily.   Yes [provider]  diazepam (VALIUM) 2 MG tablet Take 1 tablet (2 mg total) by mouth every 8 (eight) hours as needed for anxiety or muscle spasms. 07/17/17   Julianne Rice, MD  doxycycline (VIBRAMYCIN) 100 MG capsule Take 1 capsule (100 mg total) by mouth 2 (two) times daily. 07/17/17   Julianne Rice, MD  Family History Family History  Problem Relation Age of Onset  . Heart failure Mother   . Pneumonia Father   . Lung cancer Sister   . Esophageal cancer Sister   . Pancreatic cancer Maternal Grandmother     Social History Social History   Tobacco Use  . Smoking status: Former Smoker    Last attempt to quit: 02/25/1974    Years since quitting: 43.4  . Smokeless tobacco: Never Used  Substance Use Topics  . Alcohol use: Yes    Comment: rarely  . Drug use: No     Allergies   Clindamycin/lincomycin   Review of Systems Review of Systems  Constitutional: Negative for chills, fatigue and fever.  HENT: Positive for trouble swallowing. Negative for congestion, ear discharge, ear pain, facial swelling, sinus pressure and sore throat.   Eyes: Negative for visual disturbance.  Respiratory: Positive for shortness of breath. Negative for cough, chest tightness and wheezing.   Cardiovascular: Negative for chest pain, palpitations and leg swelling.  Gastrointestinal: Negative for abdominal pain, constipation, diarrhea, nausea and vomiting.  Genitourinary: Negative for dysuria, flank pain and frequency.  Musculoskeletal: Positive for myalgias, neck pain and neck stiffness. Negative for back pain.  Skin: Positive for rash.  Negative for wound.  Neurological: Negative for dizziness, weakness, light-headedness, numbness and headaches.  Psychiatric/Behavioral: The patient is nervous/anxious.   All other systems reviewed and are negative.    Physical Exam Updated Vital Signs BP (!) 151/82   Pulse 87   Temp 98.2 F (36.8 C) (Oral)   Resp (!) 22   SpO2 98%   Physical Exam  Constitutional: He is oriented to person, place, and time. He appears well-developed and well-nourished.  Anxious appearing  HENT:  Head: Normocephalic and atraumatic.  Mouth/Throat: Oropharynx is clear and moist.  Mild swelling but no definite erythema to the external right ear.  No tenderness to palpation.  No open wounds or drainage.  Oropharynx is clear.  Eyes: Pupils are equal, round, and reactive to light. EOM are normal.  Neck: Neck supple.  Patient has tenderness to palpation over the right trapezius musculature.  Limited range of motion.  Cardiovascular: Normal rate and regular rhythm. Exam reveals no gallop and no friction rub.  No murmur heard. Pulmonary/Chest: Effort normal and breath sounds normal. No stridor. No respiratory distress. He has no wheezes. He has no rales. He exhibits no tenderness.  Abdominal: Soft. Bowel sounds are normal. There is no tenderness. There is no rebound and no guarding.  Musculoskeletal: Normal range of motion. He exhibits no edema or tenderness.  Distal pulses intact.  No asymmetric lower extremity swelling, tenderness.   Lymphadenopathy:    He has no cervical adenopathy.  Neurological: He is alert and oriented to person, place, and time.  Patient is alert and oriented x3 with clear, goal oriented speech. Patient has 5/5 motor in all extremities. Sensation is intact to light touch.  Skin: Skin is warm and dry. No rash noted. He is not diaphoretic. No erythema.  Few scattered small macular lesions to the upper extremities and upper chest.  No erythema.  Possible nevi  Psychiatric: His behavior  is normal.  Nursing note and vitals reviewed.    ED Treatments / Results  Labs (all labs ordered are listed, but only abnormal results are displayed) Labs Reviewed  CBC WITH DIFFERENTIAL/PLATELET - Abnormal; Notable for the following components:      Result Value   WBC 12.4 (*)    Neutro Abs 9.2 (*)  Monocytes Absolute 1.6 (*)    All other components within normal limits  BASIC METABOLIC PANEL - Abnormal; Notable for the following components:   Glucose, Bld 185 (*)    BUN 31 (*)    All other components within normal limits  I-STAT CG4 LACTIC ACID, ED - Abnormal; Notable for the following components:   Lactic Acid, Venous 2.31 (*)    All other components within normal limits  CULTURE, BLOOD (ROUTINE X 2)  CULTURE, BLOOD (ROUTINE X 2)  BRAIN NATRIURETIC PEPTIDE  TROPONIN I    EKG EKG Interpretation  Date/Time:  Thursday Jul 17 2017 11:01:02 EDT Ventricular Rate:  84 PR Interval:    QRS Duration: 92 QT Interval:  363 QTC Calculation: 430 R Axis:   81 Text Interpretation:  Sinus rhythm Borderline right axis deviation Confirmed by Julianne Rice [phone]) on 07/17/2017 1:36:50 PM   Radiology Dg Chest 2 View  Result Date: 07/17/2017 CLINICAL DATA:  Shortness of breath. EXAM: CHEST - 2 VIEW COMPARISON:  Radiographs of April 04, 2016. FINDINGS: Stable cardiomediastinal silhouette. No pneumothorax or pleural effusion is noted. No acute pulmonary disease is noted. Stable old left rib fractures are noted. Status post coronary artery bypass graft. IMPRESSION: No active cardiopulmonary disease. Electronically Signed   By: Marijo Conception, M.D.   On: 07/17/2017 09:59    Procedures Procedures (including critical care time)  Medications Ordered in ED Medications  acetaminophen (TYLENOL) tablet 650 mg (650 mg Oral Given 07/17/17 1046)  diazepam (VALIUM) tablet 2 mg (2 mg Oral Given 07/17/17 1046)     Initial Impression / Assessment and Plan / ED Course  I have reviewed the  triage vital signs and the nursing notes.  Pertinent labs & imaging results that were available during my care of the patient were reviewed by me and considered in my medical decision making (see chart for details).    Patient remains very well-appearing with normal vital signs.  Patient with recent right ear infection on multiple antibiotics described by multiple providers.  Whether patient may have early recurrence of his right ear cellulitis though not convinced.  He has a mild elevation in his white blood cell count though recently completed course of steroids.  Mild elevation of lactic acid but does not have any signs of sepsis.  Have encouraged oral fluid intake.  As far as the patient's neck pain, significantly improved with Valium.  Patient is been ambulatory.  Very low suspicion for meningitis and especially bacterial meningitis.  Discussed the pros and cons of lumbar puncture.  Will defer at this time.  Patient understands the need to return immediately for any worsening pain, fever, headaches, visual changes, weakness or numbness. She is advised to follow-up closely with his ear nose and throat doctor.  Will restart doxycycline.  Patient is in agreement with plan.   Final Clinical Impressions(s) / ED Diagnoses   Final diagnoses:  Cellulitis of auricle of right ear  Acute strain of neck muscle, initial encounter    ED Discharge Orders        Ordered    doxycycline (VIBRAMYCIN) 100 MG capsule  2 times daily     07/17/17 1336    diazepam (VALIUM) 2 MG tablet  Every 8 hours PRN     07/17/17 1336       Julianne Rice, MD 07/17/17 1336

## 2017-07-17 NOTE — ED Provider Notes (Signed)
72 year old male seen by earlier provider.  He notes he lost his paperwork and prescriptions.  Patient denies any change in his condition since his last visit.  He was here and diagnosed with cellulitis of the right ear, and an acute muscle strain.  Patient was discharged with doxycycline and Valium.  I will reprint patient's prescriptions, he will follow previous instructions were given.   Okey Regal, PA-C 07/17/17 1733    Drenda Freeze, MD 07/17/17 2157

## 2017-07-17 NOTE — ED Notes (Signed)
Patient verbalizes understanding of discharge instructions. Opportunity for questioning and answers were provided. Armband removed by staff, pt discharged from ED ambulatory.   

## 2017-07-17 NOTE — ED Notes (Signed)
Pt provided with 2 cups of water per MD instructions

## 2017-07-17 NOTE — ED Triage Notes (Signed)
Pt to ED for prescription refills for cellulitis and neck pain

## 2017-07-17 NOTE — ED Triage Notes (Signed)
Patient here with MRSA of right ear.  Patient states that he has been having treatment with 4 courses of antibiotics.  He states that there are new wound on bilateral elbows, knees and thinks that it is moving into his neck and into his other ear.  Patient is a heart patient.  Patient has been using silvadene ointment on the areas to help not spread it.  He has been to wound clinic at [facility].

## 2017-07-17 NOTE — ED Notes (Signed)
Patient transported to X-ray 

## 2017-07-18 ENCOUNTER — Encounter (HOSPITAL_COMMUNITY): Payer: Self-pay

## 2017-07-22 LAB — CULTURE, BLOOD (ROUTINE X 2)
Culture: NO GROWTH
Culture: NO GROWTH
SPECIAL REQUESTS: ADEQUATE

## 2017-07-23 ENCOUNTER — Encounter (HOSPITAL_COMMUNITY): Payer: Self-pay

## 2017-07-25 ENCOUNTER — Encounter (HOSPITAL_COMMUNITY): Payer: Self-pay

## 2017-07-28 ENCOUNTER — Encounter (HOSPITAL_COMMUNITY): Payer: Self-pay | Attending: Cardiovascular Disease

## 2017-07-28 DIAGNOSIS — I2510 Atherosclerotic heart disease of native coronary artery without angina pectoris: Secondary | ICD-10-CM | POA: Insufficient documentation

## 2017-07-30 ENCOUNTER — Encounter (HOSPITAL_COMMUNITY): Payer: Self-pay

## 2017-08-01 ENCOUNTER — Encounter (HOSPITAL_COMMUNITY): Payer: Self-pay

## 2017-08-04 ENCOUNTER — Ambulatory Visit: Payer: Medicare Other | Admitting: Infectious Diseases

## 2017-08-04 ENCOUNTER — Encounter: Payer: Self-pay | Admitting: Infectious Diseases

## 2017-08-04 ENCOUNTER — Encounter (HOSPITAL_COMMUNITY): Payer: Self-pay

## 2017-08-04 DIAGNOSIS — H6090 Unspecified otitis externa, unspecified ear: Secondary | ICD-10-CM | POA: Insufficient documentation

## 2017-08-04 DIAGNOSIS — F419 Anxiety disorder, unspecified: Secondary | ICD-10-CM

## 2017-08-04 DIAGNOSIS — H6061 Unspecified chronic otitis externa, right ear: Secondary | ICD-10-CM | POA: Diagnosis not present

## 2017-08-04 MED ORDER — CHLORHEXIDINE GLUCONATE CLOTH 2 % EX PADS: 6.0000 | MEDICATED_PAD | CUTANEOUS | 3 refills | Status: DC

## 2017-08-04 MED ORDER — MUPIROCIN 2 % EX OINT
1.0000 | TOPICAL_OINTMENT | Freq: Two times a day (BID) | CUTANEOUS | 6 refills | Status: AC
Start: 2017-08-04 — End: 2017-08-09

## 2017-08-04 NOTE — Progress Notes (Signed)
   Subjective:    Patient ID: Samuel Coleman, male    DOB: [birth year], 72 y.o.   MRN: [id number]  HPI  72 yo M with hx of IBD,  CAD (CABG 2011, thoracic artery aneurysm), otitis externa of R ear since feb. He believes he scratched his ear.  He was seen by ENT 05-2017. Seen in ED 06-2017.  Prev treated with doxy twice (04-2017, 05-2017), levaquin, clinda (May which gave him severe rahs requiring prednisone). States that when he takes doxy his ear clears up and then returns in next 3 days. Clinda cleared it up as well, but can't take further doses.   Has also had rash on inner knees, improves with combination of cortisone, antifungal. Was told by PCP that it could be shingles. Was seen by derm, told it was spider bite.   He has also has had recurrent sores on his feet.   He has been trying to decontaminate his clothes, house from MRSA.   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   The past medical history, family history and social history were reviewed/updated in EPIC  Review of Systems  Constitutional: Negative for appetite change, chills, fever and unexpected weight change.  Respiratory: Negative for cough and shortness of breath.   Cardiovascular: Negative for chest pain.  Gastrointestinal: Negative for constipation and diarrhea.  Genitourinary: Negative for difficulty urinating.  Skin: Positive for rash and wound.  Please see HPI. All other systems reviewed and negative.      Objective:   Physical Exam  Constitutional: He is oriented to person, place, and time. He appears well-developed and well-nourished.  HENT:  Right Ear: No tenderness.  Ears:  Mouth/Throat: No oropharyngeal exudate.  Eyes: Pupils are equal, round, and reactive to light. EOM are normal.  Neck: Normal range of motion. Neck supple.  Cardiovascular: Normal rate, regular rhythm and normal heart sounds.  Pulmonary/Chest: Effort normal and breath sounds  normal.  Abdominal: Soft. Bowel sounds are normal. There is no tenderness. There is no guarding.  Musculoskeletal: Normal range of motion. He exhibits no edema.  Lymphadenopathy:    He has no cervical adenopathy.  Neurological: He is alert and oriented to person, place, and time.  Skin:     Psychiatric: He has a normal mood and affect.          Assessment & Plan:

## 2017-08-04 NOTE — Assessment & Plan Note (Addendum)
The etiology of this is unclear. His ear Cx grew bacteroides and mrsa. It is most likely skin flora. 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  His MRSA was R- tet, bactrim. S- vanco, clinda. Will defer his continued use of doxy to his PCP. I am not sure it is helping.  I would continue good local wound care.  Will give him decolonization anbx/soap.  He used CHG bath for 1 week. He also used mupirocin (May 2019).  Will resume mupirocin, ask him to use antibacterial soap (safegaurd). Will also have him use CHG wipes weekly.  Will see him back in 1 month, sooner if he has worsening sx.

## 2017-08-04 NOTE — Assessment & Plan Note (Signed)
He has a significant anxiety overlay. Brings in [employer] of his records.

## 2017-08-06 ENCOUNTER — Encounter (HOSPITAL_COMMUNITY): Payer: Self-pay

## 2017-08-07 ENCOUNTER — Telehealth: Payer: Self-pay | Admitting: Behavioral Health

## 2017-08-07 NOTE — Telephone Encounter (Signed)
Patient called stating he went to to the pharmacy to pick up Chlorhexidine wipes.  Order was for Chlorhexidine 2% however pharmacy only had 4%.  Patient wants to know if this is ok. Pricilla Riffle RN

## 2017-08-08 ENCOUNTER — Encounter (HOSPITAL_COMMUNITY): Payer: Self-pay

## 2017-08-08 NOTE — Telephone Encounter (Signed)
That is ok    thanks

## 2017-08-11 ENCOUNTER — Encounter (HOSPITAL_COMMUNITY): Payer: Self-pay

## 2017-08-11 ENCOUNTER — Emergency Department (HOSPITAL_BASED_OUTPATIENT_CLINIC_OR_DEPARTMENT_OTHER)
Admission: EM | Admit: 2017-08-11 | Discharge: 2017-08-11 | Disposition: A | Discharge status: Home / Self Care | Payer: Medicare Other | Attending: Emergency Medicine | Admitting: Emergency Medicine

## 2017-08-11 ENCOUNTER — Encounter (HOSPITAL_BASED_OUTPATIENT_CLINIC_OR_DEPARTMENT_OTHER): Payer: Self-pay | Admitting: Emergency Medicine

## 2017-08-11 ENCOUNTER — Other Ambulatory Visit: Payer: Self-pay

## 2017-08-11 ENCOUNTER — Emergency Department (HOSPITAL_BASED_OUTPATIENT_CLINIC_OR_DEPARTMENT_OTHER): Payer: Medicare Other

## 2017-08-11 DIAGNOSIS — I10 Essential (primary) hypertension: Secondary | ICD-10-CM | POA: Insufficient documentation

## 2017-08-11 DIAGNOSIS — J45909 Unspecified asthma, uncomplicated: Secondary | ICD-10-CM | POA: Insufficient documentation

## 2017-08-11 DIAGNOSIS — I252 Old myocardial infarction: Secondary | ICD-10-CM | POA: Insufficient documentation

## 2017-08-11 DIAGNOSIS — Z7982 Long term (current) use of aspirin: Secondary | ICD-10-CM | POA: Diagnosis not present

## 2017-08-11 DIAGNOSIS — I2510 Atherosclerotic heart disease of native coronary artery without angina pectoris: Secondary | ICD-10-CM | POA: Diagnosis not present

## 2017-08-11 DIAGNOSIS — Z951 Presence of aortocoronary bypass graft: Secondary | ICD-10-CM | POA: Insufficient documentation

## 2017-08-11 DIAGNOSIS — G252 Other specified forms of tremor: Secondary | ICD-10-CM | POA: Diagnosis not present

## 2017-08-11 DIAGNOSIS — Z79899 Other long term (current) drug therapy: Secondary | ICD-10-CM | POA: Diagnosis not present

## 2017-08-11 DIAGNOSIS — R251 Tremor, unspecified: Secondary | ICD-10-CM | POA: Diagnosis present

## 2017-08-11 DIAGNOSIS — Z87891 Personal history of nicotine dependence: Secondary | ICD-10-CM | POA: Insufficient documentation

## 2017-08-11 LAB — URINALYSIS, ROUTINE W REFLEX MICROSCOPIC
Bilirubin Urine: NEGATIVE
GLUCOSE, UA: NEGATIVE mg/dL
Hgb urine dipstick: NEGATIVE
Ketones, ur: NEGATIVE mg/dL
LEUKOCYTES UA: NEGATIVE
NITRITE: NEGATIVE
PH: 5.5 (ref 5.0–8.0)
PROTEIN: NEGATIVE mg/dL
Specific Gravity, Urine: 1.02 (ref 1.005–1.030)

## 2017-08-11 LAB — COMPREHENSIVE METABOLIC PANEL
ALBUMIN: 3.6 g/dL (ref 3.5–5.0)
ALT: 52 U/L (ref 17–63)
AST: 44 U/L — AB (ref 15–41)
Alkaline Phosphatase: 102 U/L (ref 38–126)
Anion gap: 9 (ref 5–15)
BUN: 22 mg/dL — AB (ref 6–20)
CHLORIDE: 103 mmol/L (ref 101–111)
CO2: 25 mmol/L (ref 22–32)
CREATININE: 0.89 mg/dL (ref 0.61–1.24)
Calcium: 9.6 mg/dL (ref 8.9–10.3)
GFR calc non Af Amer: >60 mL/min (ref >60)
GLUCOSE: 168 mg/dL — AB (ref 65–99)
Potassium: 3.9 mmol/L (ref 3.5–5.1)
SODIUM: 137 mmol/L (ref 135–145)
Total Bilirubin: 1.3 mg/dL — ABNORMAL HIGH (ref 0.3–1.2)
Total Protein: 6.6 g/dL (ref 6.5–8.1)

## 2017-08-11 LAB — CBC WITH DIFFERENTIAL/PLATELET
BASOS ABS: 0 10*3/uL (ref 0.0–0.1)
BASOS PCT: 0 %
Eosinophils Absolute: 0 10*3/uL (ref 0.0–0.7)
Eosinophils Relative: 0 %
HEMATOCRIT: 42 % (ref 39.0–52.0)
Hemoglobin: 14.5 g/dL (ref 13.0–17.0)
LYMPHS PCT: 4 %
Lymphs Abs: 0.5 10*3/uL — ABNORMAL LOW (ref 0.7–4.0)
MCH: 33.2 pg (ref 26.0–34.0)
MCHC: 34.5 g/dL (ref 30.0–36.0)
MCV: 96.1 fL (ref 78.0–100.0)
Monocytes Absolute: 1.3 10*3/uL — ABNORMAL HIGH (ref 0.1–1.0)
Monocytes Relative: 10 %
Neutro Abs: 11.9 10*3/uL — ABNORMAL HIGH (ref 1.7–7.7)
Neutrophils Relative %: 86 %
Platelets: 163 10*3/uL (ref 150–400)
RBC: 4.37 MIL/uL (ref 4.22–5.81)
RDW: 13.4 % (ref 11.5–15.5)
WBC: 13.8 10*3/uL — AB (ref 4.0–10.5)

## 2017-08-11 LAB — I-STAT CG4 LACTIC ACID, ED: LACTIC ACID, VENOUS: 1.21 mmol/L (ref 0.5–1.9)

## 2017-08-11 MED ORDER — SODIUM CHLORIDE 0.9 % IV BOLUS
500.0000 mL | Freq: Once | INTRAVENOUS | Status: AC
  Administered 2017-08-11: 500 mL via INTRAVENOUS

## 2017-08-11 NOTE — ED Triage Notes (Addendum)
Patient states that he has been battling an infection in his right ear for the last month. The patient reports that he has been on 2 round of antibiotics. Patient reports that this am with shaking all over - he became very concerned  - patient states that he has had tremors in his left hand and to his Right hand he has started to note a tremor to his right hand over the last few months. Noted tremors to his hands with triage, no other noted tremors or shaking

## 2017-08-11 NOTE — Telephone Encounter (Addendum)
Called patient and informed him per Dr. Johnnye Sima that he can use the Chlorhexidine wipes 4% instead of the 2%.  Informed him to use them as prescribed.  Patient verbalized understanding. Pricilla Riffle RN

## 2017-08-11 NOTE — ED Provider Notes (Signed)
[facility] EMERGENCY DEPARTMENT Provider Note   CSN: [id number] Arrival date & time: 08/11/17  5284     History   Chief Complaint Chief Complaint  Patient presents with  . Tremors    HPI Samuel Coleman is a 72 y.o. male.  Patient is a 72 year old male with a history of anxiety, irritable bowel disease, hypertension, hyperlipidemia, coronary artery disease status post bypass surgery who presents with tremors.  He states she woke up this morning around 630 and had shaking tremors that lasted about 15 to 20 minutes.  He states that this was involving his whole body.  He was fully awake during this episode and was able to ambulate without difficulty.  He had no speech deficits.  No vision changes.  He does have a history of tremors but is never had shaking all over like this.  He took his temperature and it was 98.  He checked his blood sugar and it was 140.  His tremors have since resolved.  He denies any chest pain or shortness of breath.  He recently has been dealing with a wound on his left ear.  This is been recurring over the last few months.  He is been treated with several rounds of antibiotics.  Wound culture did grow out MRSA.  He was recently seen by Dr. Johnnye Sima with infectious disease who felt that this may be more of a colonization.  He is currently using mupirocin ointment and chlorhexidine wipes.  He recently finished a round of doxycycline and took his last dose today for recurrence of the ear swelling.  He states the symptoms have improved.  He denies any cough or chest congestion.  No urinary symptoms although he is urinary more frequently than normal.  He was also recently diagnosed with sclerosing cholangitis which is being followed by gastroenterology.  He denies any new skin wounds or sores.     Past Medical History:  Diagnosis Date  . Anxiety   . Arthritis   . Asthma    exercise induced  . Back pain, chronic    gets injections in back  . CAD  (coronary artery disease)    a.  prior inferior MI; s/p 3V CABG 01/01/10 (LIMA to LAD, SVG to OM, SVG to RCA- Dr.Bartle);  b. Last Myoview (05/2012):  Normal; EF 70% // c. Myoview 2/18: EF 60, no ischemia or infarction, Normal Study  . Carotid stenosis    a. Carotid US (01/2011):  Bilateral ICA 0-39% (repeat in 1 year).;  b.  Carotid US (02/3242): RICA 01-02%; LICA 7-25%. Follow up 1 year;  c.  Carotid US (2/16):  R 40-59%; L 1-39%; FU 1 year.  Marland Kitchen Dysrhythmia    PVC's  . GERD (gastroesophageal reflux disease)   . Gout   . History of kidney stones    no problems with stones  . Hx of echocardiogram    Echo (01/2010):  EF 55-60%, ? Apical HK, mild BAE, atrial septal aneurysm.  Marland Kitchen Hyperlipidemia   . Hypertension   . Ischemic colitis ([place])    noted on CT back in August 2013 possible related to hypotension  . Pneumonia    x 2  . PUD (peptic ulcer disease) 1974  . PVC's (premature ventricular contractions)    Event monitor in 04/2012: NSR, PVCs.  . ST elevation myocardial infarction (STEMI) of inferior wall ([place]) 2011   s/p CABG 2011  . Thoracic aortic aneurysm ([place])     stable on CT 01/13/10;  Chest/abdominal CTA (11/2011):  3.9 x 3.6 cm prox descending thoracic aorta (stable)    Patient Active Problem List   Diagnosis Date Noted  . Otitis externa 08/04/2017  . Anxiety 08/04/2017  . S/P left inguinal hernia repair 11/02/2014  . Carotid artery disease ([place]) 03/29/2013  . S/P right inguinal hernia repair 12/16/2012  . Chest pain 12/07/2011  . Abdominal pain, other specified site 12/07/2011  . Other and unspecified noninfectious gastroenteritis and colitis(558.9) 10/05/2011  . Ischemic bowel disease ([place]) 10/04/2011  . Right inguinal hernia-scrotum 08/23/2011  . Carotid bruit 01/16/2011  . Coronary artery disease involving native coronary artery of native heart without angina pectoris 07/19/2010  . Hyperlipidemia 01/15/2010  . GOUT 01/15/2010  . Essential hypertension 01/15/2010  .  Aneurysm of thoracic aorta ([place]) 01/15/2010    Past Surgical History:  Procedure Laterality Date  . CARDIAC CATHETERIZATION    . CHEST TUBE INSERTION  1992   fx lt ribs-collapsed lung  . CORONARY ARTERY BYPASS GRAFT  11/11   X 3 VESSELS  . cyst removed from back of neck    . cyst removel on right buttocks - 2014    . EAR CYST EXCISION Left 09/02/2012   Procedure: EXCISION SEBACEOUS CYST LEFT POSTERIOR LEG;  Surgeon: Pedro Earls, MD;  Location: [facility];  Service: General;  Laterality: Left;  . Excision of deep left neck mass. SURGEON:  Leonides Sake. Lucia Gaskins, M.D.  Cramer.Melena  . EYE SURGERY  both cararacts  . HERNIA REPAIR    . INGUINAL HERNIA REPAIR Right 12/02/2012   Procedure: OPEN RIGHT INGUINAL HERINA;  Surgeon: Pedro Earls, MD;  Location: [facility];  Service: General;  Laterality: Right;  . INGUINAL HERNIA REPAIR Left 11/02/2014   Procedure: OPEN LEFT INGUINAL HERNIA REPAIR WITH MESH;  Surgeon: Johnathan Hausen, MD;  Location: [facility];  Service: General;  Laterality: Left;  . INSERTION OF MESH Right 12/02/2012   Procedure: INSERTION OF MESH;  Surgeon: Pedro Earls, MD;  Location: [facility];  Service: General;  Laterality: Right;  . LUMBAR LAMINECTOMY/DECOMPRESSION MICRODISCECTOMY N/A 01/26/2015   Procedure: Lumbar 5-sacrum 1 decompression;  Surgeon: Phylliss Bob, MD;  Location: [facility];  Service: Orthopedics;  Laterality: N/A;  . TONSILLECTOMY          Home Medications    Prior to Admission medications   Medication Sig Start Date End Date Taking? Authorizing Provider  allopurinol (ZYLOPRIM) 300 MG tablet Take 300 mg by mouth every morning.     [provider]  Ascorbic Acid (VITAMIN C) 1000 MG tablet Take 1,000 mg by mouth every morning.     [provider]  aspirin 81 MG tablet Take 81 mg by mouth daily.    [provider]  Chlorhexidine Gluconate Cloth 2 % PADS Apply 6 each topically once a week. 08/04/17   Campbell Riches, MD    Cholecalciferol (VITAMIN D3) 3000 UNITS TABS Take 3,000 Units by mouth daily.     [provider]  clotrimazole-betamethasone (LOTRISONE) cream APPLY TOPICALLY AS NEEDED. APPLY TO AFFECTED AREA DAILY OR AS DIRECTED FOOT IRRITATTION 03/21/16   [provider]  dexlansoprazole (DEXILANT) 60 MG capsule Take 60 mg by mouth every morning.     [provider]  diazepam (VALIUM) 2 MG tablet Take 1 tablet (2 mg total) by mouth every 8 (eight) hours as needed for anxiety or muscle spasms. Patient not taking: Reported on 08/04/2017 07/17/17   Okey Regal, PA-C  doxycycline (VIBRAMYCIN)  100 MG capsule Take 1 capsule (100 mg total) by mouth 2 (two) times daily. 07/17/17   Hedges, Dellis Filbert, PA-C  lisinopril (PRINIVIL,ZESTRIL) 20 MG tablet Take 20 mg by mouth at bedtime.    [provider]  lisinopril (PRINIVIL,ZESTRIL) 40 MG tablet Take 40 mg by mouth every morning.    [provider]  metoprolol tartrate (LOPRESSOR) 50 MG tablet TAKE 1 TABLET BY MOUTH TWICE A DAY 03/10/17   [name], MD  Multiple Vitamin (MULTIVITAMIN WITH MINERALS) TABS tablet Take 1 tablet by mouth every morning.     [provider]  neomycin-bacitracin-polymyxin (NEOSPORIN) [phone] ointment Apply 1 application topically 2 (two) times daily as needed (FOR FOOT IRRITATION).     [provider]  nitroGLYCERIN (NITROSTAT) 0.4 MG SL tablet Place 1 tablet (0.4 mg total) under the tongue every 5 (five) minutes as needed for chest pain. 11/26/16   Daune Perch, NP  simvastatin (ZOCOR) 40 MG tablet Take 40 mg by mouth every morning.     [provider]  vitamin B-12 (CYANOCOBALAMIN) 500 MCG tablet Take 500 mcg by mouth daily.    [provider]    Family History Family History  Problem Relation Age of Onset  . Heart failure Mother   . Pneumonia Father   . Lung cancer Sister   . Esophageal cancer Sister   . Pancreatic cancer Maternal Grandmother      Social History Social History   Tobacco Use  . Smoking status: Former Smoker    Last attempt to quit: 02/25/1974    Years since quitting: 43.4  . Smokeless tobacco: Never Used  Substance Use Topics  . Alcohol use: Yes    Comment: rarely  . Drug use: No     Allergies   Clindamycin/lincomycin   Review of Systems Review of Systems  Constitutional: Negative for chills, diaphoresis, fatigue and fever.  HENT: Negative for congestion, rhinorrhea and sneezing.   Eyes: Negative.   Respiratory: Negative for cough, chest tightness and shortness of breath.   Cardiovascular: Negative for chest pain and leg swelling.  Gastrointestinal: Negative for abdominal pain, blood in stool, diarrhea, nausea and vomiting.  Genitourinary: Negative for difficulty urinating, flank pain, frequency and hematuria.  Musculoskeletal: Negative for arthralgias and back pain.  Skin: Negative for rash.  Neurological: Positive for tremors. Negative for dizziness, speech difficulty, weakness, numbness and headaches.     Physical Exam Updated Vital Signs BP 132/76 (BP Location: Left Arm)   Pulse 87   Temp 98.6 F (37 C) (Oral)   Resp 18   Ht 5\' 9"  (1.753 m)   Wt 88.5 kg (195 lb)   SpO2 99%   BMI 28.80 kg/m   Physical Exam  Constitutional: He is oriented to person, place, and time. He appears well-developed and well-nourished.  HENT:  Head: Normocephalic and atraumatic.  No visible swelling or erythema to the ears  Eyes: Pupils are equal, round, and reactive to light.  Neck: Normal range of motion. Neck supple.  Cardiovascular: Normal rate, regular rhythm and normal heart sounds.  Pulmonary/Chest: Effort normal and breath sounds normal. No respiratory distress. He has no wheezes. He has no rales. He exhibits no tenderness.  Abdominal: Soft. Bowel sounds are normal. There is no tenderness. There is no rebound and no guarding.  Musculoskeletal: Normal range of motion. He exhibits no edema.   Lymphadenopathy:    He has no cervical adenopathy.  Neurological: He is alert and oriented to person, place, and time.  Skin: Skin is warm and dry. No rash noted.  Psychiatric: He has a normal mood and affect.     ED Treatments / Results  Labs (all labs ordered are listed, but only abnormal results are displayed) Labs Reviewed  CBC WITH DIFFERENTIAL/PLATELET - Abnormal; Notable for the following components:      Result Value   WBC 13.8 (*)    Neutro Abs 11.9 (*)    Lymphs Abs 0.5 (*)    Monocytes Absolute 1.3 (*)    All other components within normal limits  COMPREHENSIVE METABOLIC PANEL - Abnormal; Notable for the following components:   Glucose, Bld 168 (*)    BUN 22 (*)    AST 44 (*)    Total Bilirubin 1.3 (*)    All other components within normal limits  CULTURE, BLOOD (ROUTINE X 2)  CULTURE, BLOOD (ROUTINE X 2)  URINALYSIS, ROUTINE W REFLEX MICROSCOPIC  I-STAT CG4 LACTIC ACID, ED  I-STAT CG4 LACTIC ACID, ED    EKG EKG Interpretation  Date/Time:  Monday August 11 2017 09:43:38 EDT Ventricular Rate:  100 PR Interval:    QRS Duration: 80 QT Interval:  323 QTC Calculation: 417 R Axis:   86 Text Interpretation:  Sinus tachycardia Borderline right axis deviation Borderline T abnormalities, inferior leads since last tracing no significant change Confirmed by Malvin Johns [phone]) on 08/11/2017 10:16:11 AM   Radiology Dg Chest 2 View  Result Date: 08/11/2017 CLINICAL DATA:  Awakened this morning with tremors and weakness and chills. History of asthma. Has been on 2 courses of antibiotics for a right ear infection over the past month. EXAM: CHEST - 2 VIEW COMPARISON:  Chest x-ray of Jul 17, 2017 FINDINGS: The lungs are adequately inflated. There is no focal infiltrate. There is no pleural effusion. The patient has undergone previous CABG. The heart and pulmonary vascularity are normal. There are multiple old healed and non healed left rib fractures which appears stable.  There is no pneumothorax. The thoracic vertebral bodies are preserved in height. There is multilevel degenerative narrowing of the thoracic discs. IMPRESSION: There is no pneumonia nor other acute cardiopulmonary abnormality. There are chronic changes as described. Electronically Signed   By: David  Martinique M.D.   On: 08/11/2017 10:03    Procedures Procedures (including critical care time)  Medications Ordered in ED Medications  sodium chloride 0.9 % bolus 500 mL (0 mLs Intravenous Stopped 08/11/17 1138)     Initial Impression / Assessment and Plan / ED Course  I have reviewed the triage vital signs and the nursing notes.  Pertinent labs & imaging results that were available during my care of the patient were reviewed by me and considered in my medical decision making (see chart for details).     Patient is a 72 year old male who presents after he had an episode of tremors at home.  It was generalized.  It does not really sound like seizure activity.  He does not have any neurologic deficits that would be more concerning for stroke.  It sounded like chills but he has no fever here.  He has had his temperature checked different times in the ED and he has no fever.  He does have an elevated WBC count.  He however has no suggestions of infection.  His chest x-ray is normal without evidence of pneumonia.  His urine is normal.  There is no skin wounds or signs of cellulitis.  His ear does not appear to be infected.  He is  currently asymptomatic.  He has had no further tremors in the ED.  His heart rate was initially mildly tachycardic on arrival but has normalized.  He was discharged home in good condition.  He was encouraged to have close follow-up with his PCP.  He states he can follow-up with his PCP tomorrow.  Return precautions were given.  Final Clinical Impressions(s) / ED Diagnoses   Final diagnoses:  Coarse tremors    ED Discharge Orders    None       Malvin Johns, MD 08/11/17  1410

## 2017-08-13 ENCOUNTER — Encounter (HOSPITAL_COMMUNITY): Payer: Self-pay

## 2017-08-15 ENCOUNTER — Encounter (HOSPITAL_COMMUNITY): Payer: Self-pay

## 2017-08-16 LAB — CULTURE, BLOOD (ROUTINE X 2)
Culture: NO GROWTH
Culture: NO GROWTH
SPECIAL REQUESTS: ADEQUATE
Special Requests: ADEQUATE

## 2017-08-18 ENCOUNTER — Encounter (HOSPITAL_COMMUNITY): Payer: Self-pay

## 2017-08-20 ENCOUNTER — Encounter (HOSPITAL_COMMUNITY): Payer: Self-pay

## 2017-08-22 ENCOUNTER — Encounter (HOSPITAL_COMMUNITY): Payer: Self-pay

## 2017-09-03 ENCOUNTER — Ambulatory Visit: Payer: Medicare Other | Admitting: Infectious Diseases

## 2017-09-19 ENCOUNTER — Telehealth (HOSPITAL_COMMUNITY): Payer: Self-pay | Admitting: Cardiovascular Disease

## 2017-09-19 DIAGNOSIS — R002 Palpitations: Secondary | ICD-10-CM

## 2017-09-19 NOTE — Telephone Encounter (Signed)
I spoke with Samuel Coleman. He was previously in cardiac rehab but stopped due to issues with ear infections.  He would now like to resume cardiac rehab. I told Samuel Coleman cardiac rehab referral had been received in office and Dr. Angelena Form would complete on Monday. Samuel Coleman is asking if he needs office visit prior to resuming cardiac rehab.  I told him as long as he was feeling well he did not need office visit.  Samuel Coleman reports he is not having any chest pain but has been having palpitations in the evening after dinner for last week.  Lasts about 30 minutes.  He has been taking prednisone for ear issues for last 3 weeks.  I told Samuel Coleman I would make Dr. Angelena Form aware and see if he felt any testing needed to be done.

## 2017-09-19 NOTE — Telephone Encounter (Signed)
New Message:   Pt would like for you to call back, he would like to start back in cardiac rehab.

## 2017-09-22 NOTE — Telephone Encounter (Signed)
I spoke with pt and gave him information from Dr. Angelena Form.  He would like to proceed with holter monitor. I told pt our scheduling team would call him with appt date.  Completed cardiac rehab form placed in medical records box to be faxed.

## 2017-09-22 NOTE — Telephone Encounter (Signed)
Let's arrange a 48 hour monitor. He can go back to cardiac rehab. chris

## 2017-09-23 NOTE — Telephone Encounter (Signed)
Follow up    Patient calling to request records be faxed to Dr Jani Gravel office

## 2017-09-24 ENCOUNTER — Telehealth (HOSPITAL_COMMUNITY): Payer: Self-pay

## 2017-09-24 NOTE — Telephone Encounter (Signed)
Records faxed to [facility]. Dr.James Maudie Mercury

## 2017-09-24 NOTE — Telephone Encounter (Signed)
Pt called and stated he has been cleared to restart the cardiac rehab program. He would like a follow up call, to get reschedule.

## 2017-09-25 ENCOUNTER — Ambulatory Visit (INDEPENDENT_AMBULATORY_CARE_PROVIDER_SITE_OTHER): Payer: Medicare Other

## 2017-09-25 ENCOUNTER — Telehealth: Payer: Self-pay | Admitting: *Deleted

## 2017-09-25 DIAGNOSIS — R002 Palpitations: Secondary | ICD-10-CM | POA: Diagnosis not present

## 2017-09-25 NOTE — Telephone Encounter (Signed)
Patient states this AM had intense mid-right abdominal pain followed by a cold sweat.  It lasted about 5 minutes.  It has not re-occurred.  Patient was concerned because he has only broke out into the "cold sweat" on two occasions,(  August 2013 ischemic colitis and November 2011 when he had his heart attack).  Please called to discuss.

## 2017-09-26 NOTE — Telephone Encounter (Signed)
Pat, Can we check on him and see how he is feeling? He should have just completed his 48 hour monitor. If he is not having exertional chest pain or dyspnea and his abd pain resolved after the episode yesterday, I doubt that it is related to his heart disease. Gerald Stabs

## 2017-09-26 NOTE — Telephone Encounter (Signed)
I spoke with pt. He is feeling fine today.  No chest pain,shortness of breath or abdominal pain.  He will turn monitor in on Monday.

## 2017-10-01 ENCOUNTER — Encounter (HOSPITAL_COMMUNITY)
Admission: RE | Admit: 2017-10-01 | Discharge: 2017-10-01 | Disposition: A | Discharge status: Home / Self Care | Payer: Self-pay | Source: Ambulatory Visit | Attending: Cardiovascular Disease | Admitting: Cardiovascular Disease

## 2017-10-01 DIAGNOSIS — Z951 Presence of aortocoronary bypass graft: Secondary | ICD-10-CM | POA: Insufficient documentation

## 2017-10-01 DIAGNOSIS — I252 Old myocardial infarction: Secondary | ICD-10-CM | POA: Insufficient documentation

## 2017-10-03 ENCOUNTER — Encounter (HOSPITAL_COMMUNITY)
Admission: RE | Admit: 2017-10-03 | Discharge: 2017-10-03 | Disposition: A | Discharge status: Home / Self Care | Payer: Self-pay | Source: Ambulatory Visit | Attending: Cardiovascular Disease | Admitting: Cardiovascular Disease

## 2017-10-06 ENCOUNTER — Encounter (HOSPITAL_COMMUNITY)
Admission: RE | Admit: 2017-10-06 | Discharge: 2017-10-06 | Disposition: A | Discharge status: Home / Self Care | Payer: Self-pay | Source: Ambulatory Visit | Attending: Cardiovascular Disease | Admitting: Cardiovascular Disease

## 2017-10-08 ENCOUNTER — Encounter (HOSPITAL_COMMUNITY): Payer: Self-pay

## 2017-10-10 ENCOUNTER — Encounter (HOSPITAL_COMMUNITY): Payer: Self-pay

## 2017-10-13 ENCOUNTER — Encounter (HOSPITAL_COMMUNITY)
Admission: RE | Admit: 2017-10-13 | Discharge: 2017-10-13 | Disposition: A | Discharge status: Home / Self Care | Payer: Self-pay | Source: Ambulatory Visit | Attending: Cardiovascular Disease | Admitting: Cardiovascular Disease

## 2017-10-15 ENCOUNTER — Encounter (HOSPITAL_COMMUNITY): Payer: Self-pay

## 2017-10-17 ENCOUNTER — Encounter (HOSPITAL_COMMUNITY)
Admission: RE | Admit: 2017-10-17 | Discharge: 2017-10-17 | Disposition: A | Discharge status: Home / Self Care | Payer: Self-pay | Source: Ambulatory Visit | Attending: Cardiovascular Disease | Admitting: Cardiovascular Disease

## 2017-10-20 ENCOUNTER — Encounter (HOSPITAL_COMMUNITY)
Admission: RE | Admit: 2017-10-20 | Discharge: 2017-10-20 | Disposition: A | Discharge status: Home / Self Care | Payer: Self-pay | Source: Ambulatory Visit | Attending: Cardiovascular Disease | Admitting: Cardiovascular Disease

## 2017-10-22 ENCOUNTER — Encounter (HOSPITAL_COMMUNITY): Payer: Self-pay

## 2017-10-24 ENCOUNTER — Encounter (HOSPITAL_COMMUNITY): Payer: Self-pay

## 2017-10-29 ENCOUNTER — Encounter (HOSPITAL_COMMUNITY)
Admission: RE | Admit: 2017-10-29 | Discharge: 2017-10-29 | Disposition: A | Discharge status: Home / Self Care | Payer: Medicare Other | Source: Ambulatory Visit | Attending: Cardiovascular Disease | Admitting: Cardiovascular Disease

## 2017-10-29 DIAGNOSIS — I252 Old myocardial infarction: Secondary | ICD-10-CM | POA: Insufficient documentation

## 2017-10-29 DIAGNOSIS — Z951 Presence of aortocoronary bypass graft: Secondary | ICD-10-CM | POA: Insufficient documentation

## 2017-10-31 ENCOUNTER — Encounter (HOSPITAL_COMMUNITY)
Admission: RE | Admit: 2017-10-31 | Discharge: 2017-10-31 | Disposition: A | Discharge status: Home / Self Care | Payer: Self-pay | Source: Ambulatory Visit | Attending: Cardiovascular Disease | Admitting: Cardiovascular Disease

## 2017-11-03 ENCOUNTER — Encounter (HOSPITAL_COMMUNITY)
Admission: RE | Admit: 2017-11-03 | Discharge: 2017-11-03 | Disposition: A | Discharge status: Home / Self Care | Payer: Medicare Other | Source: Ambulatory Visit | Attending: Cardiovascular Disease | Admitting: Cardiovascular Disease

## 2017-11-05 ENCOUNTER — Encounter (HOSPITAL_COMMUNITY)
Admission: RE | Admit: 2017-11-05 | Discharge: 2017-11-05 | Disposition: A | Discharge status: Home / Self Care | Payer: Self-pay | Source: Ambulatory Visit | Attending: Cardiovascular Disease | Admitting: Cardiovascular Disease

## 2017-11-06 ENCOUNTER — Telehealth: Payer: Self-pay | Admitting: *Deleted

## 2017-11-06 DIAGNOSIS — E785 Hyperlipidemia, unspecified: Secondary | ICD-10-CM

## 2017-11-06 DIAGNOSIS — I712 Thoracic aortic aneurysm, without rupture, unspecified: Secondary | ICD-10-CM

## 2017-11-06 DIAGNOSIS — I2510 Atherosclerotic heart disease of native coronary artery without angina pectoris: Secondary | ICD-10-CM

## 2017-11-06 NOTE — Telephone Encounter (Signed)
CT scheduler called triage to request for a BMET order to be placed for this pt, to have prior to his imaging test.  BMET placed and scheduler to arrange lab appointment.

## 2017-11-07 ENCOUNTER — Encounter (HOSPITAL_COMMUNITY): Payer: Self-pay

## 2017-11-10 ENCOUNTER — Encounter (HOSPITAL_COMMUNITY): Payer: Self-pay

## 2017-11-12 ENCOUNTER — Encounter (HOSPITAL_COMMUNITY)
Admission: RE | Admit: 2017-11-12 | Discharge: 2017-11-12 | Disposition: A | Discharge status: Home / Self Care | Payer: Self-pay | Source: Ambulatory Visit | Attending: Cardiovascular Disease | Admitting: Cardiovascular Disease

## 2017-11-14 ENCOUNTER — Encounter (HOSPITAL_COMMUNITY): Payer: Self-pay

## 2017-11-17 ENCOUNTER — Encounter (HOSPITAL_COMMUNITY)
Admission: RE | Admit: 2017-11-17 | Discharge: 2017-11-17 | Disposition: A | Discharge status: Home / Self Care | Payer: Medicare Other | Source: Ambulatory Visit | Attending: Cardiovascular Disease | Admitting: Cardiovascular Disease

## 2017-11-19 ENCOUNTER — Encounter (HOSPITAL_COMMUNITY)
Admission: RE | Admit: 2017-11-19 | Discharge: 2017-11-19 | Disposition: A | Discharge status: Home / Self Care | Payer: Self-pay | Source: Ambulatory Visit | Attending: Cardiovascular Disease | Admitting: Cardiovascular Disease

## 2017-11-20 ENCOUNTER — Telehealth: Payer: Self-pay | Admitting: Cardiovascular Disease

## 2017-11-20 NOTE — Telephone Encounter (Signed)
I spoke with pt. He reports he has been having swelling of his ear and been diagnosed with relapsing polychondritis. Is now seeing rheumatology --Dr. Kathlene November at [facility]. Pt reports this can cause damage to other areas of his body and is asking if he needs to have an echocardiogram done.  He had lab work done recently and he may possibly have Wegener's granulomatosis.  He is going to send lab work to our office.

## 2017-11-20 NOTE — Telephone Encounter (Signed)
New Message         Patient would like a call back concerning the CT that's coming up.

## 2017-11-21 ENCOUNTER — Encounter (HOSPITAL_COMMUNITY)
Admission: RE | Admit: 2017-11-21 | Discharge: 2017-11-21 | Disposition: A | Discharge status: Home / Self Care | Payer: Self-pay | Source: Ambulatory Visit | Attending: Cardiovascular Disease | Admitting: Cardiovascular Disease

## 2017-11-21 NOTE — Telephone Encounter (Signed)
I do not think he needs an echo right now. Echo was normal 10 months ago. Gerald Stabs

## 2017-11-24 ENCOUNTER — Telehealth: Payer: Self-pay | Admitting: Cardiovascular Disease

## 2017-11-24 ENCOUNTER — Encounter (HOSPITAL_COMMUNITY): Payer: Self-pay

## 2017-11-24 NOTE — Telephone Encounter (Signed)
New Message          Patient returned your call. Pls call again

## 2017-11-24 NOTE — Telephone Encounter (Signed)
I spoke with pt and gave him information from Dr. McAlhany 

## 2017-11-24 NOTE — Telephone Encounter (Signed)
See previous phone note.  

## 2017-11-24 NOTE — Telephone Encounter (Signed)
Left message to call back  

## 2017-11-26 ENCOUNTER — Encounter (HOSPITAL_COMMUNITY): Payer: Self-pay

## 2017-11-26 DIAGNOSIS — I252 Old myocardial infarction: Secondary | ICD-10-CM | POA: Insufficient documentation

## 2017-11-26 DIAGNOSIS — Z951 Presence of aortocoronary bypass graft: Secondary | ICD-10-CM | POA: Insufficient documentation

## 2017-11-28 ENCOUNTER — Encounter (HOSPITAL_COMMUNITY)
Admission: RE | Admit: 2017-11-28 | Discharge: 2017-11-28 | Disposition: A | Discharge status: Home / Self Care | Payer: Self-pay | Source: Ambulatory Visit | Attending: Cardiovascular Disease | Admitting: Cardiovascular Disease

## 2017-12-01 ENCOUNTER — Encounter (HOSPITAL_COMMUNITY)
Admission: RE | Admit: 2017-12-01 | Discharge: 2017-12-01 | Disposition: A | Discharge status: Home / Self Care | Payer: Self-pay | Source: Ambulatory Visit | Attending: Cardiovascular Disease | Admitting: Cardiovascular Disease

## 2017-12-03 ENCOUNTER — Other Ambulatory Visit: Payer: Self-pay

## 2017-12-03 ENCOUNTER — Other Ambulatory Visit: Payer: Self-pay | Admitting: Cardiovascular Disease

## 2017-12-03 ENCOUNTER — Encounter (HOSPITAL_COMMUNITY): Payer: Self-pay

## 2017-12-03 ENCOUNTER — Emergency Department (HOSPITAL_COMMUNITY): Payer: Medicare Other

## 2017-12-03 ENCOUNTER — Emergency Department (HOSPITAL_COMMUNITY)
Admission: EM | Admit: 2017-12-03 | Discharge: 2017-12-03 | Disposition: A | Discharge status: Home / Self Care | Payer: Medicare Other | Attending: Emergency Medicine | Admitting: Emergency Medicine

## 2017-12-03 ENCOUNTER — Encounter (HOSPITAL_COMMUNITY): Payer: Self-pay | Admitting: *Deleted

## 2017-12-03 DIAGNOSIS — Z7982 Long term (current) use of aspirin: Secondary | ICD-10-CM | POA: Diagnosis not present

## 2017-12-03 DIAGNOSIS — J45909 Unspecified asthma, uncomplicated: Secondary | ICD-10-CM | POA: Diagnosis not present

## 2017-12-03 DIAGNOSIS — I10 Essential (primary) hypertension: Secondary | ICD-10-CM | POA: Diagnosis not present

## 2017-12-03 DIAGNOSIS — Z87891 Personal history of nicotine dependence: Secondary | ICD-10-CM | POA: Insufficient documentation

## 2017-12-03 DIAGNOSIS — Z79899 Other long term (current) drug therapy: Secondary | ICD-10-CM | POA: Insufficient documentation

## 2017-12-03 DIAGNOSIS — I2510 Atherosclerotic heart disease of native coronary artery without angina pectoris: Secondary | ICD-10-CM | POA: Insufficient documentation

## 2017-12-03 DIAGNOSIS — R072 Precordial pain: Secondary | ICD-10-CM | POA: Diagnosis not present

## 2017-12-03 DIAGNOSIS — R079 Chest pain, unspecified: Secondary | ICD-10-CM | POA: Diagnosis present

## 2017-12-03 LAB — BASIC METABOLIC PANEL
ANION GAP: 13 (ref 5–15)
BUN: 24 mg/dL — ABNORMAL HIGH (ref 8–23)
CALCIUM: 10.3 mg/dL (ref 8.9–10.3)
CHLORIDE: 98 mmol/L (ref 98–111)
CO2: 25 mmol/L (ref 22–32)
Creatinine, Ser: 0.98 mg/dL (ref 0.61–1.24)
GFR calc Af Amer: >60 mL/min (ref >60)
GFR calc non Af Amer: >60 mL/min (ref >60)
GLUCOSE: 250 mg/dL — AB (ref 70–99)
Potassium: 3.8 mmol/L (ref 3.5–5.1)
Sodium: 136 mmol/L (ref 135–145)

## 2017-12-03 LAB — I-STAT TROPONIN, ED
TROPONIN I, POC: 0.02 ng/mL (ref 0.00–0.08)
Troponin i, poc: 0.01 ng/mL (ref 0.00–0.08)

## 2017-12-03 LAB — CBC
HEMATOCRIT: 45.4 % (ref 39.0–52.0)
Hemoglobin: 15.4 g/dL (ref 13.0–17.0)
MCH: 33.3 pg (ref 26.0–34.0)
MCHC: 33.9 g/dL (ref 30.0–36.0)
MCV: 98.1 fL (ref 80.0–100.0)
NRBC: 0 % (ref 0.0–0.2)
PLATELETS: 252 10*3/uL (ref 150–400)
RBC: 4.63 MIL/uL (ref 4.22–5.81)
RDW: 13.3 % (ref 11.5–15.5)
WBC: 9.8 10*3/uL (ref 4.0–10.5)

## 2017-12-03 LAB — CBG MONITORING, ED: GLUCOSE-CAPILLARY: 263 mg/dL — AB (ref 70–99)

## 2017-12-03 MED ORDER — IOPAMIDOL (ISOVUE-370) INJECTION 76%
100.0000 mL | Freq: Once | INTRAVENOUS | Status: AC | PRN
  Administered 2017-12-03: 100 mL via INTRAVENOUS

## 2017-12-03 MED ORDER — MORPHINE SULFATE (PF) 4 MG/ML IV SOLN
4.0000 mg | Freq: Once | INTRAVENOUS | Status: AC
Start: 2017-12-03 — End: 2017-12-03
  Administered 2017-12-03: 4 mg via INTRAVENOUS
  Filled 2017-12-03: qty 1

## 2017-12-03 MED ORDER — IOPAMIDOL (ISOVUE-370) INJECTION 76%
INTRAVENOUS | Status: AC
  Filled 2017-12-03: qty 100

## 2017-12-03 NOTE — ED Triage Notes (Signed)
Patient states he went on his walk this am fixed his breakfast showered and states he was getting dressed and became diaphoretic and nauseated. States the pain radiated to his back . States he feels the same as when he had his MI 01/05/10. Ems gave him Zofran 4 mg IV and NTG x 2 states he felt worse after taking NTG. Rates pain 7/10

## 2017-12-03 NOTE — ED Notes (Signed)
Patient transported to CT 

## 2017-12-03 NOTE — ED Notes (Signed)
Patient transported to X-ray 

## 2017-12-03 NOTE — ED Provider Notes (Signed)
[facility] EMERGENCY DEPARTMENT Provider Note   CSN: [id number] Arrival date & time: 12/03/17  1051     History   Chief Complaint Chief Complaint  Patient presents with  . Chest Pain    HPI Samuel Coleman is a 72 y.o. male.  HPI Patient is a 72 year old male presents the emergency department with complaints of nausea and chest discomfort after getting dressed this morning.  He had had his normal routine including a walk for exercise without difficulty.  He had some upper abdominal discomfort and lower chest discomfort with radiation through to his back.  He has a history of coronary artery bypass grafting in 2011.  He was given nitroglycerin and Zofran in route.  He continues to have discomfort at this time.  He has a known thoracic aortic aneurysm which is being monitored with annual CT angiograms.  He is scheduled for one in the coming weeks.  Denies weakness in his arms or legs.  No nausea at this time.  No vomiting.  No diarrhea.  Denies lower abdominal pain.  No urinary complaints.  Symptoms are mild to moderate in severity.   Past Medical History:  Diagnosis Date  . Anxiety   . Arthritis   . Asthma    exercise induced  . Back pain, chronic    gets injections in back  . CAD (coronary artery disease)    a.  prior inferior MI; s/p 3V CABG 01/01/10 (LIMA to LAD, SVG to OM, SVG to RCA- Dr.Bartle);  b. Last Myoview (05/2012):  Normal; EF 70% // c. Myoview 2/18: EF 60, no ischemia or infarction, Normal Study  . Carotid stenosis    a. Carotid US (01/2011):  Bilateral ICA 0-39% (repeat in 1 year).;  b.  Carotid US (08/3708): RICA 62-69%; LICA 4-85%. Follow up 1 year;  c.  Carotid US (2/16):  R 40-59%; L 1-39%; FU 1 year.  Marland Kitchen Dysrhythmia    PVC's  . GERD (gastroesophageal reflux disease)   . Gout   . History of kidney stones    no problems with stones  . Hx of echocardiogram    Echo (01/2010):  EF 55-60%, ? Apical HK, mild BAE, atrial septal aneurysm.  Marland Kitchen  Hyperlipidemia   . Hypertension   . Ischemic colitis ([place])    noted on CT back in August 2013 possible related to hypotension  . Pneumonia    x 2  . PUD (peptic ulcer disease) 1974  . PVC's (premature ventricular contractions)    Event monitor in 04/2012: NSR, PVCs.  . ST elevation myocardial infarction (STEMI) of inferior wall ([place]) 2011   s/p CABG 2011  . Thoracic aortic aneurysm ([place])     stable on CT 01/13/10;  Chest/abdominal CTA (11/2011):  3.9 x 3.6 cm prox descending thoracic aorta (stable)    Patient Active Problem List   Diagnosis Date Noted  . Otitis externa 08/04/2017  . Anxiety 08/04/2017  . S/P left inguinal hernia repair 11/02/2014  . Carotid artery disease ([place]) 03/29/2013  . S/P right inguinal hernia repair 12/16/2012  . Chest pain 12/07/2011  . Abdominal pain, other specified site 12/07/2011  . Other and unspecified noninfectious gastroenteritis and colitis(558.9) 10/05/2011  . 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 Right inguinal hernia-scrotum 08/23/2011  . Carotid bruit 01/16/2011  . Coronary artery disease involving native coronary artery of native heart without angina pectoris 07/19/2010  . Hyperlipidemia 01/15/2010  . GOUT 01/15/2010  . Essential hypertension 01/15/2010  .  Aneurysm of thoracic aorta ([place]) 01/15/2010    Past Surgical History:  Procedure Laterality Date  . CARDIAC CATHETERIZATION    . CHEST TUBE INSERTION  1992   fx lt ribs-collapsed lung  . CORONARY ARTERY BYPASS GRAFT  11/11   X 3 VESSELS  . cyst removed from back of neck    . cyst removel on right buttocks - 2014    . EAR CYST EXCISION Left 09/02/2012   Procedure: EXCISION SEBACEOUS CYST LEFT POSTERIOR LEG;  Surgeon: Pedro Earls, MD;  Location: [facility];  Service: General;  Laterality: Left;  . Excision of deep left neck mass. SURGEON:  Leonides Sake. Lucia Gaskins, M.D.  Cramer.Melena  . EYE SURGERY  both cararacts  . HERNIA REPAIR    . INGUINAL HERNIA REPAIR Right  12/02/2012   Procedure: OPEN RIGHT INGUINAL HERINA;  Surgeon: Pedro Earls, MD;  Location: [facility];  Service: General;  Laterality: Right;  . INGUINAL HERNIA REPAIR Left 11/02/2014   Procedure: OPEN LEFT INGUINAL HERNIA REPAIR WITH MESH;  Surgeon: Johnathan Hausen, MD;  Location: [facility];  Service: General;  Laterality: Left;  . INSERTION OF MESH Right 12/02/2012   Procedure: INSERTION OF MESH;  Surgeon: Pedro Earls, MD;  Location: [facility];  Service: General;  Laterality: Right;  . LUMBAR LAMINECTOMY/DECOMPRESSION MICRODISCECTOMY N/A 01/26/2015   Procedure: Lumbar 5-sacrum 1 decompression;  Surgeon: Phylliss Bob, MD;  Location: [facility];  Service: Orthopedics;  Laterality: N/A;  . TONSILLECTOMY          Home Medications    Prior to Admission medications   Medication Sig Start Date End Date Taking? Authorizing Provider  allopurinol (ZYLOPRIM) 300 MG tablet Take 300 mg by mouth every morning.    Yes [provider]  Ascorbic Acid (VITAMIN C) 1000 MG tablet Take 1,000 mg by mouth every morning.    Yes [provider]  aspirin 81 MG tablet Take 81 mg by mouth daily.   Yes [provider]  Cholecalciferol (VITAMIN D3) 3000 UNITS TABS Take 3,000 Units by mouth daily.    Yes [provider]  clotrimazole-betamethasone (LOTRISONE) cream Apply 1 application topically See admin instructions. Apply 2 times a day to both feet 03/21/16  Yes [provider]  dexlansoprazole (DEXILANT) 60 MG capsule Take 60 mg by mouth every morning.    Yes [provider]  lisinopril (PRINIVIL,ZESTRIL) 40 MG tablet Take 40 mg by mouth 2 (two) times daily.    Yes [provider]  metoprolol tartrate (LOPRESSOR) 50 MG tablet TAKE 1 TABLET BY MOUTH TWICE A DAY Patient taking differently: Take 50 mg by mouth 2 (two) times daily.  12/03/17  Yes [name], MD  Multiple Vitamin (MULTIVITAMIN WITH MINERALS) TABS tablet Take 1 tablet by mouth every morning.     Yes [provider]  mupirocin ointment (BACTROBAN) 2 % Apply 1 application topically See admin instructions. Apply to both nostrils 2 times a day for the first 5 days of each month for 6 months 11/18/17  Yes [provider]  neomycin-bacitracin-polymyxin (NEOSPORIN) [phone] ointment Apply 1 application topically 2 (two) times daily as needed (FOR FOOT IRRITATION).    Yes [provider]  nitroGLYCERIN (NITROSTAT) 0.4 MG SL tablet Place 1 tablet (0.4 mg total) under the tongue every 5 (five) minutes as needed for chest pain. 11/26/16  Yes Daune Perch, NP  silver sulfADIAZINE (SILVADENE) 1 % cream Prn ears 06/17/17 06/17/18 Yes [provider]  simvastatin (  ZOCOR) 40 MG tablet Take 40 mg by mouth daily.    Yes [provider]  vitamin B-12 (CYANOCOBALAMIN) 500 MCG tablet Take 500 mcg by mouth daily.   Yes [provider]  Chlorhexidine Gluconate Cloth 2 % PADS Apply 6 each topically once a week. Patient not taking: Reported on 12/03/2017 08/04/17   Campbell Riches, MD  diazepam (VALIUM) 2 MG tablet Take 1 tablet (2 mg total) by mouth every 8 (eight) hours as needed for anxiety or muscle spasms. Patient not taking: Reported on 12/03/2017 07/17/17   Hedges, Dellis Filbert, PA-C  doxycycline (VIBRAMYCIN) 100 MG capsule Take 1 capsule (100 mg total) by mouth 2 (two) times daily. Patient not taking: Reported on 12/03/2017 07/17/17   Hedges, Dellis Filbert, PA-C  hydrocortisone cream 1 % Apply 1 application topically. Feet ears prn    [provider]  predniSONE (DELTASONE) 10 MG tablet 2 TABS X 2 WKS, 1.5 TABS X 2 WKS, 1 TABS X 2 WKS ONCE A DAY ORALLY 42 DAYS 11/27/17   [provider]  ursodiol (ACTIGALL) 300 MG capsule Take 300 mg by mouth 3 (three) times daily. 11/07/17   [provider]  vitamin E 400 UNIT capsule Take 400 Units by mouth daily.    [provider]    Family History Family History  Problem Relation Age of  Onset  . Heart failure Mother   . Pneumonia Father   . Lung cancer Sister   . Esophageal cancer Sister   . Pancreatic cancer Maternal Grandmother     Social History Social History   Tobacco Use  . Smoking status: Former Smoker    Last attempt to quit: 02/25/1974    Years since quitting: 43.8  . Smokeless tobacco: Never Used  Substance Use Topics  . Alcohol use: Yes    Comment: rarely  . Drug use: No     Allergies   Clindamycin/lincomycin and Levofloxacin   Review of Systems Review of Systems  All other systems reviewed and are negative.    Physical Exam Updated Vital Signs BP 110/63   Pulse 97   Temp 97.8 F (36.6 C) (Oral)   Resp 20   SpO2 94%   Physical Exam  Constitutional: He is oriented to person, place, and time. He appears well-developed and well-nourished.  HENT:  Head: Normocephalic and atraumatic.  Eyes: EOM are normal.  Neck: Normal range of motion.  Cardiovascular: Normal rate, regular rhythm, normal heart sounds and intact distal pulses.  Pulmonary/Chest: Effort normal and breath sounds normal. No respiratory distress.  Abdominal: Soft. He exhibits no distension. There is no tenderness.  Musculoskeletal: Normal range of motion.  Neurological: He is alert and oriented to person, place, and time.  Skin: Skin is warm and dry.  Psychiatric: He has a normal mood and affect. Judgment normal.  Nursing note and vitals reviewed.    ED Treatments / Results  Labs (all labs ordered are listed, but only abnormal results are displayed) Labs Reviewed  BASIC METABOLIC PANEL - Abnormal; Notable for the following components:      Result Value   Glucose, Bld 250 (*)    BUN 24 (*)    All other components within normal limits  CBG MONITORING, ED - Abnormal; Notable for the following components:   Glucose-Capillary 263 (*)    All other components within normal limits  CBC  COMPREHENSIVE METABOLIC PANEL  LIPASE, BLOOD  I-STAT TROPONIN, ED  I-STAT  TROPONIN, ED    EKG EKG  Interpretation  Date/Time:  Wednesday December 03 2017 10:45:08 EDT Ventricular Rate:  53 PR Interval:  158 QRS Duration: 88 QT Interval:  390 QTC Calculation: 365 R Axis:   96 Text Interpretation:  Sinus bradycardia with marked sinus arrhythmia Rightward axis Cannot rule out Anterior infarct , age undetermined Abnormal ECG No significant change was found Confirmed by Jola Schmidt [phone]) on 12/03/2017 11:23:29 AM   Radiology Dg Chest 2 View  Result Date: 12/03/2017 CLINICAL DATA:  Chest pain. EXAM: CHEST - 2 VIEW COMPARISON:  Radiographs of August 11, 2017. FINDINGS: The heart size and mediastinal contours are within normal limits. Both lungs are clear. No pneumothorax or pleural effusion is noted. Status post coronary artery bypass graft. Stable old left rib fractures are noted. IMPRESSION: No active cardiopulmonary disease. Electronically Signed   By: Marijo Conception, M.D.   On: 12/03/2017 12:42   Ct Angio Chest/abd/pel For Dissection W And/or Wo Contrast  Result Date: 12/03/2017 CLINICAL DATA:  Known thoracic aortic aneurysm. Diaphoretic. Pain radiating to back. EXAM: CT ANGIOGRAPHY CHEST, ABDOMEN AND PELVIS TECHNIQUE: Multidetector CT imaging through the chest, abdomen and pelvis was performed using the standard protocol during bolus administration of intravenous contrast. Multiplanar reconstructed images and MIPs were obtained and reviewed to evaluate the vascular anatomy. CONTRAST:  150mL ISOVUE-370 IOPAMIDOL (ISOVUE-370) INJECTION 76% COMPARISON:  Chest CT 12/05/2016 FINDINGS: CTA CHEST FINDINGS Cardiovascular: Non IV contrast imaging demonstrates no intramural hematoma within the thoracic aorta. Contrast enhanced imaging demonstrates saccular aneurysm originating from the proximal descending thoracic aorta just past aortic arch. Aortic diameter at this aneurysm level measures 3.6 x 3.3 cm in total compared to 3.9 x 3.3 cm on prior for no change. There is  calcification of the saccular aneurysm is wall (image 34/6). The ascending aorta and descending aorta normal otherwise. Great vessels normal. CABG anatomy. Mediastinum/Nodes: No axillary supraclavicular adenopathy. No mediastinal no pericardial effusion. Esophagus normal Lungs/Pleura: 3 mm nodule in the RIGHT upper lobe (image 44/8) is unchanged. Musculoskeletal: Remote rib fractures of the posterior LEFT ribs. No aggressive osseous lesion Review of the MIP images confirms the above findings. CTA ABDOMEN AND PELVIS FINDINGS VASCULAR Aorta: The abdominal aorta is normal caliber. No evidence of dissection. Mild intimal calcification. Celiac: Widely patent. SMA: Widely patent Renals: Widely patent single renal arteries IMA: Widely patent Inflow: Normal Veins: Normal Review of the MIP images confirms the above findings. NON-VASCULAR Hepatobiliary: No focal hepatic lesion. Gallbladder distended to 4.4 cm. No radiodense gallstones. There is mild mucosal enhancement of the common bile duct and common hepatic duct seen best on coronal image 77/9. No intrahepatic duct dilatation. Pancreas: No pancreatic duct dilatation. No pancreatic inflammation. Spleen: Normal spleen Adrenals/Urinary Tract: Adrenal glands normal. Nonobstructing calculus in the RIGHT kidney. Nonobstructing calculus LEFT kidney. Nonobstructing calculus in the distal RIGHT ureter measuring 5 mm (image 182/6). This distal RIGHT ureteral calculus approximately 2 cm from the RIGHT vesicoureteral junction Stomach/Bowel: Stomach, small-bowel, appendix cecum normal. The colon rectosigmoid colon normal. Lymphatic: No lymphadenopathy Reproductive: Prostate normal Other: Large LEFT inguinal hernia contains a segment of nonobstructed descending colon. Musculoskeletal: Remote LEFT pelvic inferior ramus fracture. No aggressive osseous lesion Review of the MIP images confirms the above findings. IMPRESSION: Chest Impression: 1. No evidence of aortic dissection. 2. 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 Abdomen / Pelvis Impression: 1. No evidence of dissection or aneurysm of the abdominal aorta or branches. 2. Mucosal enhancement of the extrahepatic bile ducts is nonspecific. Recommend clinical correlation  for cholangitis. 3. Nonobstructing calculus in distal RIGHT ureter. 4. Bilateral nephrolithiasis. 5. LEFT inguinal hernia contains a loop of descending colon. No obstruction. Electronically Signed   By: Suzy Bouchard M.D.   On: 12/03/2017 16:05    Procedures Procedures (including critical care time)  Medications Ordered in ED Medications  iopamidol (ISOVUE-370) 76 % injection (has no administration in time range)  morphine 4 MG/ML injection 4 mg (4 mg Intravenous Given 12/03/17 1152)  iopamidol (ISOVUE-370) 76 % injection 100 mL (100 mLs Intravenous Contrast Given 12/03/17 1508)     Initial Impression / Assessment and Plan / ED Course  I have reviewed the triage vital signs and the nursing notes.  Pertinent labs & imaging results that were available during my care of the patient were reviewed by me and considered in my medical decision making (see chart for details).     EKG without ischemic changes.  Troponin negative x2.  Pain is resolved.  CT angiogram chest abdomen pelvis demonstrates no evidence of increasing aortic aneurysm diameter or evidence of aortic dissection.  Patient feels better this time.  He is well-appearing.  I believe the patient is safe for discharge.  He will need follow-up with his primary care physician and his cardiologist.  Atypical chest pain.  Patient is encouraged to return to the ER for new or worsening symptoms  Final Clinical Impressions(s) / ED Diagnoses   Final diagnoses:  Precordial chest pain    ED Discharge Orders    None       Jola Schmidt, MD 12/03/17 [phone]

## 2017-12-03 NOTE — ED Notes (Signed)
Pt verbalized understanding of discharge paperwork and follow-up appt

## 2017-12-04 ENCOUNTER — Telehealth: Payer: Self-pay | Admitting: Cardiovascular Disease

## 2017-12-04 ENCOUNTER — Other Ambulatory Visit: Payer: Medicare Other | Admitting: *Deleted

## 2017-12-04 DIAGNOSIS — I2510 Atherosclerotic heart disease of native coronary artery without angina pectoris: Secondary | ICD-10-CM

## 2017-12-04 DIAGNOSIS — I712 Thoracic aortic aneurysm, without rupture, unspecified: Secondary | ICD-10-CM

## 2017-12-04 DIAGNOSIS — E785 Hyperlipidemia, unspecified: Secondary | ICD-10-CM

## 2017-12-04 LAB — BASIC METABOLIC PANEL
BUN/Creatinine Ratio: 27 — ABNORMAL HIGH (ref 10–24)
BUN: 31 mg/dL — ABNORMAL HIGH (ref 8–27)
CO2: 22 mmol/L (ref 20–29)
Calcium: 10.3 mg/dL — ABNORMAL HIGH (ref 8.6–10.2)
Chloride: 93 mmol/L — ABNORMAL LOW (ref 96–106)
Creatinine, Ser: 1.15 mg/dL (ref 0.76–1.27)
GFR calc non Af Amer: 62 mL/min/{1.73_m2} (ref >59)
GFR, EST AFRICAN AMERICAN: 72 mL/min/{1.73_m2} (ref >59)
Glucose: 283 mg/dL — ABNORMAL HIGH (ref 65–99)
POTASSIUM: 4.4 mmol/L (ref 3.5–5.2)
SODIUM: 134 mmol/L (ref 134–144)

## 2017-12-04 NOTE — Telephone Encounter (Signed)
CTA shows stable aneurysm. Troponin was negative. The ED doc did not think his pain was cardiac.   Lauree Chandler

## 2017-12-04 NOTE — Telephone Encounter (Signed)
New Message:    Patient has questions about the CT Scan and blood work

## 2017-12-04 NOTE — Telephone Encounter (Signed)
Left message for pt to call office

## 2017-12-04 NOTE — Telephone Encounter (Signed)
Pt had CTA when in ED yesterday.  Will ask Dr. Angelena Form to review

## 2017-12-04 NOTE — Telephone Encounter (Signed)
I spoke with pt and told him Dr. Angelena Form has reviewed CT Scan and aneurysm is stable.  I told him he did not need repeat CT scan next week and I have cancelled this appointment.   Pt reports he thinks issue yesterday may be gallbladder related.   Pt had lab work done in our office today and glucose was elevated. Pt reports he was just started on metformin today.  BMP results routed to Dr Maudie Mercury and Dr Kathlene November per pt's request.

## 2017-12-05 ENCOUNTER — Encounter (HOSPITAL_COMMUNITY): Payer: Self-pay

## 2017-12-08 ENCOUNTER — Encounter (HOSPITAL_COMMUNITY)
Admission: RE | Admit: 2017-12-08 | Discharge: 2017-12-08 | Disposition: A | Discharge status: Home / Self Care | Payer: Medicare Other | Source: Ambulatory Visit | Attending: Cardiovascular Disease | Admitting: Cardiovascular Disease

## 2017-12-08 ENCOUNTER — Other Ambulatory Visit: Payer: Medicare Other

## 2017-12-10 ENCOUNTER — Encounter (HOSPITAL_COMMUNITY): Payer: Self-pay

## 2017-12-12 ENCOUNTER — Encounter (HOSPITAL_COMMUNITY)
Admission: RE | Admit: 2017-12-12 | Discharge: 2017-12-12 | Disposition: A | Discharge status: Home / Self Care | Payer: Medicare Other | Source: Ambulatory Visit | Attending: Cardiovascular Disease | Admitting: Cardiovascular Disease

## 2017-12-15 ENCOUNTER — Encounter (HOSPITAL_COMMUNITY)
Admission: RE | Admit: 2017-12-15 | Discharge: 2017-12-15 | Disposition: A | Discharge status: Home / Self Care | Payer: Self-pay | Source: Ambulatory Visit | Attending: Cardiovascular Disease | Admitting: Cardiovascular Disease

## 2017-12-16 ENCOUNTER — Telehealth: Payer: Self-pay | Admitting: Cardiovascular Disease

## 2017-12-16 MED ORDER — LISINOPRIL 40 MG PO TABS: 40.0000 mg | ORAL_TABLET | Freq: Two times a day (BID) | ORAL | 3 refills | Status: DC

## 2017-12-16 MED ORDER — METOPROLOL TARTRATE 50 MG PO TABS: 75.0000 mg | ORAL_TABLET | Freq: Two times a day (BID) | ORAL | 3 refills | Status: DC

## 2017-12-16 NOTE — Telephone Encounter (Signed)
Pt advised that Dr. Angelena Form agrees with the plan that was discussed with him earlier this morning. Pt will let us know how he is doing.

## 2017-12-16 NOTE — Telephone Encounter (Addendum)
Pt reports "a lot of PVCs working at cardiac rehab" Started a year ago, but were very inconsistent, mild.    He reports PVCs have become "very severe over the past 2-3 times I've been at cardiac rehab".  Long time to recover-kept having PVCs for couple hours after finished exercising.  Very fatigued.   Can barely go 2.5 mph on treadmill.   Wants Dr. Angelena Form to be aware.  Denies CP during this time.  Only noticing during exercise.  Just diagnosed with DM a couple weeks ago and wonders if it has something to do with it.  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  Adv pt to increase metoprolol to 75 mg BID and avoid stimulants, per recommendations on holter monitor report in Aug by Dr. Angelena Form.   Pt does not have BP numbers but has it checked before and after exercise at cardiac rehab.    He is aware I will call him back if there are further recommendations from his doctor.

## 2017-12-16 NOTE — Telephone Encounter (Signed)
New message  Pt c/o medication issue:  1. Name of Medication:  lisinopril (PRINIVIL,ZESTRIL) 40 MG tablet        2. How are you currently taking this medication (dosage and times per day)? Twice daily   3. Are you having a reaction (difficulty breathing--STAT)? No   4. What is your medication issue? Patient states that this is an old prescription and he has questions about the dosage.   Patient states that he is having with  PVC's during exercise.  Please contact to discuss.

## 2017-12-16 NOTE — Telephone Encounter (Signed)
I agree with increasing his Lopressor to 75 mg BID. The steroids can worsen his PVCs . No other rec's at this time. Gerald Stabs

## 2017-12-17 ENCOUNTER — Encounter (HOSPITAL_COMMUNITY): Payer: Self-pay

## 2017-12-19 ENCOUNTER — Encounter (HOSPITAL_COMMUNITY): Payer: Self-pay

## 2017-12-22 ENCOUNTER — Encounter (HOSPITAL_COMMUNITY)
Admission: RE | Admit: 2017-12-22 | Discharge: 2017-12-22 | Disposition: A | Discharge status: Home / Self Care | Payer: Self-pay | Source: Ambulatory Visit | Attending: Cardiovascular Disease | Admitting: Cardiovascular Disease

## 2017-12-22 ENCOUNTER — Encounter: Payer: Self-pay | Admitting: Cardiovascular Disease

## 2017-12-22 ENCOUNTER — Telehealth: Payer: Self-pay | Admitting: Cardiovascular Disease

## 2017-12-22 ENCOUNTER — Telehealth (HOSPITAL_COMMUNITY): Payer: Self-pay | Admitting: Cardiac Rehabilitation

## 2017-12-22 NOTE — Progress Notes (Signed)
Cardiology Office Note    Date:  12/23/2017  ID:  STANLY SI, DOB [birth year], MRN [id number] PCP:  Jani Gravel, MD  Cardiologist:  Lauree Chandler, MD   Chief Complaint: evaluate PVCs  History of Present Illness:  Samuel Coleman is a 72 y.o. 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   He was remotely admitted to [facility] 12/2009 with an acute inferior STEMI. Emergent cardiac cath showed severe three vessel CAD. He underwent 3V CABG later that day with Dr. Cyndia Bent (LIMA to LAD, SVG to OM, SVG to RCA). He was admitted to [facility] August 2013 for abdominal pain and empirically diagnosed with ischemic colitis. CT and exam was unremarkable and it was felt related to recent diet change, resolved with BM. It was recommended to hold beta blocker if blood pressure was low. He also has had prior reported HR in the 40 range but not clear if this was related to PVCs in the past. He has had a few prior event monitors for palpitations showing NSR with PACs/PVCs; most recently in 09/2017 with frequent PVCs (2%) and rare PACs. He also has had several nuclear stress tests since CABG, the last in 03/2016 which was normal. Last echo 11/2016 showed EF 55-60%, grade 1 DD, mild LAE.   He recently was seen at cardiac rehab with increased PVCs and decreased exercise tolerance. He fills me in on extensive details of interval history since 03/2017 which includes persistent recurrent swelling of his ear initially treated as infection with rounds of doxycycline and possible MRA. 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 He has been on  several rounds of prednisone. Over the last week he's noticed increased fatigue and PVCs. They seem to be worse after lunch, lasting several hours before dissipating. It is not clear in speaking with him if they worsen with activity. Lopressor was increased via phone note to 75mg  BID but has not had any effect. At some point in the past he was on atenolol but reports it was changed around the time of his CABG for unclear reasons. Of note he was seen in ER 12/03/17 for atypical chest pain that presented as upper abdominal/lower chest/flank/back discomfort. It was not worse with any particular measures and did not improve with NTG en route. It lasted about 2 hours and semi-reminded him of MI pain. It resolved with morphine. CT angio of the chest 11/2017 showed no dissection, nonspecific enhancement of extrahepatic bile ducts, nonobstructing distal R ureter, bilateral nephrolithasis, left inguinal hernia with loop of descending colon without obstruction, saccular thoracic aneurysm within proximal descending thoracic aorta measuring 3.6x3.3cm (no change from prior). He's followed up with his other specialists at this time and it remains unclear if his pain could have been related to other findings on imaging. It has not recurred. Last labs 11/2017 K 4.4, calcium 10.3 (intermittently elev in the past, BUN 31, Cr 1.15), troponin neg x 2, CBC wnl, LDL 60, AST/ALT OK, A1C 7.8. He has since been back to cardiac rehab and denies any recurrent CP with exertion but just a general sense of fatigue.     Past Medical History:  Diagnosis Date  . Anxiety   .  Arthritis   . Asthma    exercise induced  . Back pain, chronic    gets injections in back  . CAD (coronary artery disease)    a.  prior inferior MI; s/p 3V CABG 01/01/10 (LIMA to LAD, SVG to OM, SVG to RCA- Dr.Bartle);  b. Last Myoview (05/2012):  Normal; EF 70% // c. Myoview 2/18: EF 60, no ischemia or infarction, Normal Study  . Carotid stenosis    a. Carotid US  (01/2011):  Bilateral ICA 0-39% (repeat in 1 year).;  b.  Carotid US (02/9507): RICA 32-67%; LICA 1-24%. Follow up 1 year;  c.  Carotid US (2/16):  R 40-59%; L 1-39%; FU 1 year.  . Diabetes mellitus type 2 in nonobese ([place])   . Frequent PVCs   . GERD (gastroesophageal reflux disease)   . Gout   . History of kidney stones    no problems with stones  . Hx of echocardiogram    Echo (01/2010):  EF 55-60%, ? Apical HK, mild BAE, atrial septal aneurysm.  Marland Kitchen Hyperlipidemia   . Hypertension   . Ischemic colitis ([place])    noted on CT back in August 2013 possible related to hypotension  . Pneumonia    x 2  . Premature atrial contractions   . PUD (peptic ulcer disease) 1974  . ST elevation myocardial infarction (STEMI) of inferior wall ([place]) 2011   s/p CABG 2011  . Thoracic aortic aneurysm ([place])    a. last assessed 11/2017 3.6x3.3cm.  . 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 CARDIAC CATHETERIZATION    . CHEST TUBE INSERTION  1992   fx lt ribs-collapsed lung  . CORONARY ARTERY BYPASS GRAFT  11/11   X 3 VESSELS  . cyst removed from back of neck    . cyst removel on right buttocks - 2014    . EAR CYST EXCISION Left 09/02/2012   Procedure: EXCISION SEBACEOUS CYST LEFT POSTERIOR LEG;  Surgeon: Pedro Earls, MD;  Location: [facility];  Service: General;  Laterality: Left;  . Excision of deep left neck mass. SURGEON:  Leonides Sake. Lucia Gaskins, M.D.  Cramer.Melena  . EYE SURGERY  both cararacts  . HERNIA REPAIR    . INGUINAL HERNIA REPAIR Right 12/02/2012   Procedure: OPEN RIGHT INGUINAL HERINA;  Surgeon: Pedro Earls, MD;  Location: [facility];  Service: General;  Laterality: Right;  . INGUINAL HERNIA REPAIR Left 11/02/2014   Procedure: OPEN LEFT INGUINAL HERNIA REPAIR WITH MESH;  Surgeon: Johnathan Hausen, MD;  Location: [facility];  Service: General;  Laterality: Left;  . INSERTION OF MESH Right 12/02/2012    Procedure: INSERTION OF MESH;  Surgeon: Pedro Earls, MD;  Location: [facility];  Service: General;  Laterality: Right;  . LUMBAR LAMINECTOMY/DECOMPRESSION MICRODISCECTOMY N/A 01/26/2015   Procedure: Lumbar 5-sacrum 1 decompression;  Surgeon: Phylliss Bob, MD;  Location: [facility];  Service: Orthopedics;  Laterality: N/A;  . TONSILLECTOMY      Current Medications: Current Meds  Medication Sig  . allopurinol (ZYLOPRIM) 300 MG tablet Take 300 mg by mouth every morning.   . Ascorbic Acid (VITAMIN C) 1000 MG tablet Take 1,000 mg by mouth every morning.   Marland Kitchen aspirin 81 MG tablet Take 81 mg by mouth daily.  . Cholecalciferol (VITAMIN D3) 3000 UNITS TABS Take 3,000 Units by mouth daily.   . clotrimazole-betamethasone (LOTRISONE) cream Apply 1 application topically See  admin instructions. Apply 2 times a day to both feet  . dexlansoprazole (DEXILANT) 60 MG capsule Take 60 mg by mouth every morning.   . hydrocortisone cream 1 % Apply 1 application topically as needed (for itching on ears or feet).   Marland Kitchen lisinopril (PRINIVIL,ZESTRIL) 40 MG tablet Take 1 tablet (40 mg total) by mouth 2 (two) times daily.  . metFORMIN (GLUCOPHAGE) 500 MG tablet Take 500 mg by mouth 2 (two) times daily.  . methocarbamol (ROBAXIN) 500 MG tablet Take 500 mg by mouth 4 (four) times daily as needed for muscle spasms.   . metoprolol tartrate (LOPRESSOR) 50 MG tablet Take 1.5 tablets (75 mg total) by mouth 2 (two) times daily.  . Multiple Vitamin (MULTIVITAMIN WITH MINERALS) TABS tablet Take 1 tablet by mouth every morning.   . mupirocin ointment (BACTROBAN) 2 % Apply 1 application topically See admin instructions. Apply to both nostrils 2 times a day for the first 5 days of each month for 6 months  . neomycin-bacitracin-polymyxin (NEOSPORIN) [phone] ointment Apply 1 application topically 2 (two) times daily as needed (FOR FOOT IRRITATION).   Marland Kitchen nitroGLYCERIN (NITROSTAT) 0.4 MG SL tablet Place 1 tablet (0.4 mg total) under the tongue  every 5 (five) minutes as needed for chest pain.  . predniSONE (DELTASONE) 10 MG tablet Take 10-20 mg by mouth See admin instructions. Take 20 mg by mouth once a day for 14 days then 15 mg once a day for 14 days then 10 mg once a day for 14 days  . silver sulfADIAZINE (SILVADENE) 1 % cream Apply 1 application topically as needed (to affected areas of ears as directed).   . simvastatin (ZOCOR) 40 MG tablet Take 40 mg by mouth daily.   . ursodiol (ACTIGALL) 300 MG capsule Take 300 mg by mouth 3 (three) times daily.  . vitamin B-12 (CYANOCOBALAMIN) 500 MCG tablet Take 500 mcg by mouth daily.  . vitamin E 400 UNIT capsule Take 400 Units by mouth daily.  . [DISCONTINUED] diazepam (VALIUM) 2 MG tablet Take 1 tablet (2 mg total) by mouth every 8 (eight) hours as needed for anxiety or muscle spasms.  . [DISCONTINUED] doxycycline (VIBRAMYCIN) 100 MG capsule Take 1 capsule (100 mg total) by mouth 2 (two) times daily.      Allergies:   Clindamycin/lincomycin and Levofloxacin   Social History   Socioeconomic History  . Marital status: Widowed    Spouse name: Not on file  . Number of children: Not on file  . Years of education: Not on file  . Highest education level: Not on file  Occupational History  . Not on file  Social Needs  . Financial resource strain: Not on file  . Food insecurity:    Worry: Not on file    Inability: Not on file  . Transportation needs:    Medical: Not on file    Non-medical: Not on file  Tobacco Use  . Smoking status: Former Smoker    Last attempt to quit: 02/25/1974    Years since quitting: 43.8  . Smokeless tobacco: Never Used  Substance and Sexual Activity  . Alcohol use: Yes    Comment: rarely  . Drug use: No  . Sexual activity: Not on file  Lifestyle  . Physical activity:    Days per week: Not on file    Minutes per session: Not on file  . Stress: Not on file  Relationships  . Social connections:    Talks on phone: Not on  file    Gets together: Not on  file    Attends religious service: Not on file    Active member of club or organization: Not on file    Attends meetings of clubs or organizations: Not on file    Relationship status: Not on file  Other Topics Concern  . Not on file  Social History Narrative  . Not on file     Family History:  The patient's family history includes Esophageal cancer in his sister; Heart failure in his mother; Lung cancer in his sister; Pancreatic cancer in his maternal grandmother; Pneumonia in his father.  ROS:   Please see the history of present illness.  All other systems are reviewed and otherwise negative.    PHYSICAL EXAM:   VS:  BP 138/72   Pulse 85   Ht 5\' 9"  (1.753 m)   Wt 179 lb 6.4 oz (81.4 kg)   SpO2 99%   BMI 26.49 kg/m   BMI: Body mass index is 26.49 kg/m. GEN: Well nourished, well developed WM in no acute distress HEENT: normocephalic, atraumatic Neck: no JVD, carotid bruits, or masses Cardiac: Irregularly irregular c/w frequent ectopy; no murmurs, rubs, or gallops, no edema  Respiratory:  clear to auscultation bilaterally, normal work of breathing GI: soft, nontender, nondistended, + BS MS: no deformity or atrophy Skin: warm and dry, no rash Neuro:  Alert and Oriented x 3, Strength and sensation are intact, follows commands Psych: euthymic mood, full affect  Wt Readings from Last 3 Encounters:  12/23/17 179 lb 6.4 oz (81.4 kg)  08/11/17 195 lb (88.5 kg)  08/04/17 196 lb (88.9 kg)      Studies/Labs Reviewed:   EKG:  EKG was ordered today and personally reviewed by me and demonstrates NSR 85bpm with freuqnet PVCS otherwise nonacute, QTc 445ms  Recent Labs: 07/17/2017: B Natriuretic Peptide 56.8 08/11/2017: ALT 52 12/03/2017: Hemoglobin 15.4; Platelets 252 12/04/2017: BUN 31; Creatinine, Ser 1.15; Potassium 4.4; Sodium 134   Lipid Panel No results found for: CHOL, TRIG, HDL, CHOLHDL, VLDL, LDLCALC, LDLDIRECT  Additional studies/ records that were reviewed today  include: Summarized above.   ASSESSMENT & PLAN:   1. Frequent PVCs - noted on prior event monitoring, but burden was only 2% in 09/2017. He is now noted to have more frequent bigeminy and ectopy in general. It is most notable after meals. He has had no change with metoprolol. He reports being on atenolol remotely but unclear why changed. I cannot find any mention of this in notes in Epic going back to 2011. Will d/c metoprolol and try a trial of acebutolol 200mg  BID given specific indication for suppression of PVCs. I asked him to call in within 2 days of starting medicine with update on how ectopy is doing. If no change, would consider trial of atenolol again or propranolol as some patients respond differently to alternative beta blockers. If these persist would involve EP. Will update labs and echocardiogram. Given recent ED visit for CP, will update exercise nuclear stress test as well. 2. Fatigue and recent chest discomfort in context of PVCs and known CAD - plan echo and nuc as above. 3. History of PACs - quiescent. 4. Essential HTN - follow BP with med change. 5. Thoracic aortic aneurysm - stable by recent CT.  Disposition: F/u with Dr. McAlhany/care team APP in 2 weeks.   Medication Adjustments/Labs and Tests Ordered: Current medicines are reviewed at length with the patient today.  Concerns regarding medicines are outlined  above. Medication changes, Labs and Tests ordered today are summarized above and listed in the Patient Instructions accessible in Encounters.   Signed, Charlie Pitter, PA-C  12/23/2017 2:28 PM    [facility] [address], [place], [place]  [address] Phone: [phone]; Fax: [phone]

## 2017-12-22 NOTE — Progress Notes (Signed)
CARDIAC REHAB MAINTENANCE  Pt c/o  Increased PVC with exercise at cardiac rehab. Pt has associated fatigue, decreased stamina and functional ability.  Pt withholding activities at cardiac rehab due to these symptoms. Pt discussed last week with Dr. Angelena Form office, pt instructed  to increase metoprolol 75mg .  Pt started new dose on 12/19/2017.  Today, Pt sinus rhythm with bigeminal PVC post exercise.  Pt is concerned the increased dose has not relieved the PVC.  Pt is concerned about increased PVC and recent episode of back pain with diaphoresis which pt describes as his anginal equivalent.  Pt is currently on prednisone taper for autoimmune disorder with plans to start immunosuppressant therapy in a few weeks.  In light of recent increase PVC coupled with fatigue and anginal equivalent episode, appt made of pt to be seen in office Tuesday 12/23/2017 @ 2pm with Dayna Dunn,PA.  Rhythm strip and rehab report faxed for review.    Andi Hence, RN, BSN Cardiac Pulmonary Rehab

## 2017-12-22 NOTE — Telephone Encounter (Signed)
pc to pt to advise of Dayna Dunn,PA appt on Tuesday 12/23/2017 at 2:00pm  Pt verbalized understanding. Andi Hence, RN, BSN Cardiac Pulmonary Rehab

## 2017-12-23 ENCOUNTER — Encounter: Payer: Self-pay | Admitting: Physician Assistant

## 2017-12-23 ENCOUNTER — Encounter: Payer: Self-pay | Admitting: *Deleted

## 2017-12-23 ENCOUNTER — Ambulatory Visit: Payer: Medicare Other | Admitting: Physician Assistant

## 2017-12-23 VITALS — BP 138/72 | HR 85 | Ht 69.0 in | Wt 179.4 lb

## 2017-12-23 DIAGNOSIS — I491 Atrial premature depolarization: Secondary | ICD-10-CM | POA: Diagnosis not present

## 2017-12-23 DIAGNOSIS — I493 Ventricular premature depolarization: Secondary | ICD-10-CM

## 2017-12-23 DIAGNOSIS — I10 Essential (primary) hypertension: Secondary | ICD-10-CM | POA: Diagnosis not present

## 2017-12-23 DIAGNOSIS — I712 Thoracic aortic aneurysm, without rupture, unspecified: Secondary | ICD-10-CM

## 2017-12-23 DIAGNOSIS — I2510 Atherosclerotic heart disease of native coronary artery without angina pectoris: Secondary | ICD-10-CM

## 2017-12-23 MED ORDER — ACEBUTOLOL HCL 200 MG PO CAPS: 200.0000 mg | ORAL_CAPSULE | Freq: Two times a day (BID) | ORAL | 3 refills | Status: DC

## 2017-12-23 NOTE — Patient Instructions (Signed)
Medication Instructions:  Your physician has recommended you make the following change in your medication:  1.  STOP the Metoprolol 2.  START Acetabutalol 200 mg taking 1 tablet by mouth twice a day   If you need a refill on your cardiac medications before your next appointment, please call your pharmacy.   Lab work: TODAY:  BMET, CBC, MAG, AND TSH  If you have labs (blood work) drawn today and your tests are completely normal, you will receive your results only by: Marland Kitchen MyChart Message (if you have MyChart) OR . A paper copy in the mail If you have any lab test that is abnormal or we need to change your treatment, we will call you to review the results.  Testing/Procedures: Your physician has requested that you have an echocardiogram. Echocardiography is a painless test that uses sound waves to create images of your heart. It provides your doctor with information about the size and shape of your heart and how well your heart's chambers and valves are working. This procedure takes approximately one hour. There are no restrictions for this procedure.   Your physician has requested that you have en exercise stress myoview. For further information please visit [email or web address]. Please follow instruction sheet, as given.    Follow-Up: Your physician recommends that you schedule a follow-up appointment in: 2-3 [facility] APP  Any Other Special Instructions Will Be Listed Below (If Applicable). Echocardiogram An echocardiogram, or echocardiography, uses sound waves (ultrasound) to produce an image of your heart. The echocardiogram is simple, painless, obtained within a short period of time, and offers valuable information to your health care provider. The images from an echocardiogram can provide information such as:  Evidence of coronary artery disease (CAD).  Heart size.  Heart muscle function.  Heart valve function.  Aneurysm detection.  Evidence of a  past heart attack.  Fluid buildup around the heart.  Heart muscle thickening.  Assess heart valve function.  Tell a health care provider about:  Any allergies you have.  All medicines you are taking, including vitamins, herbs, eye drops, creams, and over-the-counter medicines.  Any problems you or family members have had with anesthetic medicines.  Any blood disorders you have.  Any surgeries you have had.  Any medical conditions you have.  Whether you are pregnant or may be pregnant. What happens before the procedure? No special preparation is needed. Eat and drink normally. What happens during the procedure?  In order to produce an image of your heart, gel will be applied to your chest and a wand-like tool (transducer) will be moved over your chest. The gel will help transmit the sound waves from the transducer. The sound waves will harmlessly bounce off your heart to allow the heart images to be captured in real-time motion. These images will then be recorded.  You may need an IV to receive a medicine that improves the quality of the pictures. What happens after the procedure? You may return to your normal schedule including diet, activities, and medicines, unless your health care provider tells you otherwise. This information is not intended to replace advice given to you by your health care provider. Make sure you discuss any questions you have with your health care provider. Document Released: 02/09/2000 Document Revised: 09/30/2015 Document Reviewed: 10/19/2012 Elsevier Interactive Patient Education  2017 [employer].    Exercise Stress Electrocardiogram An exercise stress electrocardiogram is a test that is done to evaluate the blood supply to your  heart. This test may also be called exercise stress electrocardiography. The test is done while you are walking on a treadmill. The goal of this test is to raise your heart rate. This test is done to find areas of poor  blood flow to the heart by determining the extent of coronary artery disease (CAD). CAD is defined as narrowing in one or more heart (coronary) arteries of more than 70%. If you have an abnormal test result, this may mean that you are not getting adequate blood flow to your heart during exercise. Additional testing may be needed to understand why your test was abnormal. Tell a health care provider about:  Any allergies you have.  All medicines you are taking, including vitamins, herbs, eye drops, creams, and over-the-counter medicines.  Any problems you or family members have had with anesthetic medicines.  Any blood disorders you have.  Any surgeries you have had.  Any medical conditions you have.  Possibility of pregnancy, if this applies. What are the risks? Generally, this is a safe procedure. However, as with any procedure, complications can occur. Possible complications can include:  Pain or pressure in the following areas: ? Chest. ? Jaw or neck. ? Between your shoulder blades. ? Radiating down your left arm.  Dizziness or light-headedness.  Shortness of breath.  Increased or irregular heartbeats.  Nausea or vomiting.  Heart attack (rare).  What happens before the procedure?  Avoid all forms of caffeine 24 hours before your test or as directed by your health care provider. This includes coffee, tea (even decaffeinated tea), caffeinated sodas, chocolate, cocoa, and certain pain medicines.  Follow your health care provider's instructions regarding eating and drinking before the test.  Take your medicines as directed at regular times with water unless instructed otherwise. Exceptions may include: ? If you have diabetes, ask how you are to take your insulin or pills. It is common to adjust insulin dosing the morning of the test. ? If you are taking beta-blocker medicines, it is important to talk to your health care provider about these medicines well before the date of  your test. Taking beta-blocker medicines may interfere with the test. In some cases, these medicines need to be changed or stopped 24 hours or more before the test. ? If you wear a nitroglycerin patch, it may need to be removed prior to the test. Ask your health care provider if the patch should be removed before the test.  If you use an inhaler for any breathing condition, bring it with you to the test.  If you are an outpatient, bring a snack so you can eat right after the stress phase of the test.  Do not smoke for 4 hours prior to the test or as directed by your health care provider.  Do not apply lotions, powders, creams, or oils on your chest prior to the test.  Wear loose-fitting clothes and comfortable shoes for the test. This test involves walking on a treadmill. What happens during the procedure?  Multiple patches (electrodes) will be put on your chest. If needed, small areas of your chest may have to be shaved to get better contact with the electrodes. Once the electrodes are attached to your body, multiple wires will be attached to the electrodes and your heart rate will be monitored.  Your heart will be monitored both at rest and while exercising.  You will walk on a treadmill. The treadmill will be started at a slow pace. The treadmill speed and  incline will gradually be increased to raise your heart rate. What happens after the procedure?  Your heart rate and blood pressure will be monitored after the test.  You may return to your normal schedule including diet, activities, and medicines, unless your health care provider tells you otherwise. This information is not intended to replace advice given to you by your health care provider. Make sure you discuss any questions you have with your health care provider. Document Released: 02/09/2000 Document Revised: 07/20/2015 Document Reviewed: 10/19/2012 Elsevier Interactive Patient Education  2017 [employer].

## 2017-12-24 ENCOUNTER — Encounter (HOSPITAL_COMMUNITY)
Admission: RE | Admit: 2017-12-24 | Discharge: 2017-12-24 | Disposition: A | Discharge status: Home / Self Care | Payer: Self-pay | Source: Ambulatory Visit | Attending: Cardiovascular Disease | Admitting: Cardiovascular Disease

## 2017-12-24 LAB — CBC
Hematocrit: 42.7 % (ref 37.5–51.0)
Hemoglobin: 14.6 g/dL (ref 13.0–17.7)
MCH: 32.3 pg (ref 26.6–33.0)
MCHC: 34.2 g/dL (ref 31.5–35.7)
MCV: 95 fL (ref 79–97)
PLATELETS: 244 10*3/uL (ref 150–450)
RBC: 4.52 x10E6/uL (ref 4.14–5.80)
RDW: 12.3 % (ref 12.3–15.4)
WBC: 11.1 10*3/uL — ABNORMAL HIGH (ref 3.4–10.8)

## 2017-12-24 LAB — BASIC METABOLIC PANEL
BUN / CREAT RATIO: 32 — AB (ref 10–24)
BUN: 34 mg/dL — ABNORMAL HIGH (ref 8–27)
CHLORIDE: 96 mmol/L (ref 96–106)
CO2: 19 mmol/L — ABNORMAL LOW (ref 20–29)
Calcium: 10.5 mg/dL — ABNORMAL HIGH (ref 8.6–10.2)
Creatinine, Ser: 1.06 mg/dL (ref 0.76–1.27)
GFR calc non Af Amer: 69 mL/min/{1.73_m2} (ref >59)
GFR, EST AFRICAN AMERICAN: 80 mL/min/{1.73_m2} (ref >59)
Glucose: 397 mg/dL — ABNORMAL HIGH (ref 65–99)
Potassium: 5.2 mmol/L (ref 3.5–5.2)
Sodium: 133 mmol/L — ABNORMAL LOW (ref 134–144)

## 2017-12-24 LAB — TSH: TSH: 0.357 u[IU]/mL — AB (ref 0.450–4.500)

## 2017-12-24 LAB — MAGNESIUM: Magnesium: 1.4 mg/dL — ABNORMAL LOW (ref 1.6–2.3)

## 2017-12-25 ENCOUNTER — Telehealth: Payer: Self-pay | Admitting: Cardiovascular Disease

## 2017-12-25 DIAGNOSIS — I493 Ventricular premature depolarization: Secondary | ICD-10-CM

## 2017-12-25 MED ORDER — LISINOPRIL 40 MG PO TABS: 40.0000 mg | ORAL_TABLET | Freq: Every day | ORAL | 3 refills | Status: DC

## 2017-12-25 NOTE — Telephone Encounter (Signed)
New Message ° ° ° ° ° ° ° ° ° °Patient returned your call, would like a call back. °

## 2017-12-25 NOTE — Telephone Encounter (Signed)
I spoke with pt and reviewed lab results and recommendations from Melina Copa, [place] with him. He reports he cannot take Magnesium because he is being started on an immunosuppressant medication (mycophenolate).  I told pt I would send this information to Melina Copa, PA for review/recommendation. Pt is seeing primary care today.  Will forward lab results to Dr Maudie Mercury, Dr. Kathlene November and Dr. Benson Norway per pt's request. Pt will call us back tomorrow with update on how he is feeling.  He thinks medication is helping. He was able to exercise yesterday without PVC's.

## 2017-12-25 NOTE — Telephone Encounter (Signed)
I spoke with pt and gave him information from [place], [place].  Pt does not want to start magnesium oxide due to information he has regarding this medication along with mycophenolate.  He will check with rheumatologist about this. He is aware of foods rich in magnesium.  Pt will come in for lab work on November 6,2019.

## 2017-12-25 NOTE — Telephone Encounter (Signed)
Notes recorded by Charlie Pitter, PA-C on 12/24/2017 at 8:58 AM EDT Please call patient. Several lab abnormalities noted: - as he is aware, his blood sugar is very high - recently dx with DM in setting of steroid use, needs to continue to communicate blood sugars to physician managing this. His sodium level appears low but this has to be adjusted for high blood sugar and subsequently calculates to normal.  - BUN remains elevated, consistent with a level of dehydration. Please make sure he is getting adequate fluid intake up to 64 oz of noncaffeinated fluids per day. With frequent PVCs would avoid other extra sources of caffeine. His potassium is upper limits of normal. Given these findings I would advise he decrease lisinopril to 40mg  once daily. Need to follow BP at home and call us if running >176 systolic at which time we would need to adjust meds further. - calcium level remains persistently elevated - would avoid excess calcium in diet, avoid supplements with calcium or extra vitamin D and discuss with primary care - white blood cell count mildly elevated consistent with recent steroid use, no specific concern here - magnesium level is extremely low and could contribute to PVCs. Recommend starting MagOx 400mg  BID. Please increase dietary intake of healthy sources of magnesium including leafy greens, nuts, seeds, fish, beans, whole grains, avocados, and yogurt. Recommend repeat BMET/Mg within 1 week to trend. Avoid alcohol as this can worsen low magnesium level and PVCs.  - LASTLY! (this is a lot.) TSH is low indicating possible overactive thyroid which can also contribute to his recent symptoms. Needs to have this rechecked with primary care asap to confirm.  I had also asked him to send [place] a message on Friday with update on how he is feeling on new med. Thanks.  Dayna Dunn PA-C

## 2017-12-25 NOTE — Telephone Encounter (Signed)
I could not find any interaction between mycophenolate and magnesium oxide via UpTo Date but researched further on Lexicomp. They are not contraindicated together but suggested to separate in timing of doses as it can decrease concentrations of mycophenolate. Given degree of low magnesium level, I would definitely suggest he discuss with the doctor rx'ing the mycophenolate to get their input. At bare minimum needs to make sure he's getting good dietary sources. Thank you for the preliminary update! Would still suggest trending labs as outlined in prior note, but this can also be done by PCP. Dayna Dunn PA-C

## 2017-12-26 ENCOUNTER — Encounter (HOSPITAL_COMMUNITY)
Admission: RE | Admit: 2017-12-26 | Discharge: 2017-12-26 | Disposition: A | Discharge status: Home / Self Care | Payer: Self-pay | Source: Ambulatory Visit | Attending: Cardiovascular Disease | Admitting: Cardiovascular Disease

## 2017-12-26 DIAGNOSIS — I252 Old myocardial infarction: Secondary | ICD-10-CM | POA: Insufficient documentation

## 2017-12-26 DIAGNOSIS — Z951 Presence of aortocoronary bypass graft: Secondary | ICD-10-CM | POA: Insufficient documentation

## 2017-12-26 NOTE — Telephone Encounter (Signed)
Error no note needed °

## 2017-12-29 ENCOUNTER — Encounter (HOSPITAL_COMMUNITY)
Admission: RE | Admit: 2017-12-29 | Discharge: 2017-12-29 | Disposition: A | Discharge status: Home / Self Care | Payer: Self-pay | Source: Ambulatory Visit | Attending: Cardiovascular Disease | Admitting: Cardiovascular Disease

## 2017-12-31 ENCOUNTER — Telehealth: Payer: Self-pay | Admitting: Physician Assistant

## 2017-12-31 ENCOUNTER — Encounter (HOSPITAL_COMMUNITY): Payer: Self-pay

## 2017-12-31 ENCOUNTER — Other Ambulatory Visit: Payer: Medicare Other | Admitting: *Deleted

## 2017-12-31 DIAGNOSIS — I493 Ventricular premature depolarization: Secondary | ICD-10-CM

## 2017-12-31 NOTE — Telephone Encounter (Signed)
Walk In pt Form-Envelope dropped off for PA. Placed in [employer].

## 2018-01-01 ENCOUNTER — Telehealth: Payer: Self-pay | Admitting: Physician Assistant

## 2018-01-01 ENCOUNTER — Other Ambulatory Visit: Payer: Self-pay | Admitting: *Deleted

## 2018-01-01 ENCOUNTER — Encounter: Payer: Self-pay | Admitting: *Deleted

## 2018-01-01 LAB — BASIC METABOLIC PANEL
BUN/Creatinine Ratio: 39 — ABNORMAL HIGH (ref 10–24)
BUN: 33 mg/dL — ABNORMAL HIGH (ref 8–27)
CO2: 22 mmol/L (ref 20–29)
Calcium: 10.3 mg/dL — ABNORMAL HIGH (ref 8.6–10.2)
Chloride: 96 mmol/L (ref 96–106)
Creatinine, Ser: 0.85 mg/dL (ref 0.76–1.27)
GFR, EST AFRICAN AMERICAN: 99 mL/min/{1.73_m2} (ref >59)
GFR, EST NON AFRICAN AMERICAN: 86 mL/min/{1.73_m2} (ref >59)
Glucose: 219 mg/dL — ABNORMAL HIGH (ref 65–99)
POTASSIUM: 4.8 mmol/L (ref 3.5–5.2)
Sodium: 133 mmol/L — ABNORMAL LOW (ref 134–144)

## 2018-01-01 LAB — MAGNESIUM: Magnesium: 1.2 mg/dL — ABNORMAL LOW (ref 1.6–2.3)

## 2018-01-01 MED ORDER — ATENOLOL 50 MG PO TABS: 50.0000 mg | ORAL_TABLET | Freq: Every day | ORAL | 3 refills | Status: DC

## 2018-01-01 MED ORDER — MAGNESIUM OXIDE -MG SUPPLEMENT 400 (240 MG) MG PO TABS: 400.0000 mg | ORAL_TABLET | Freq: Two times a day (BID) | ORAL | 3 refills | Status: DC

## 2018-01-01 NOTE — Telephone Encounter (Signed)
Sent pt message below, to mychart.

## 2018-01-01 NOTE — Telephone Encounter (Addendum)
Samuel Coleman,  Patient dropped off form with numerous questions that are best answered in this telephone note rather than under results tab.  To address his questions:  1. He had concerns about taking magnesium supplement for his low magnesium level due to possible interaction with mycophenolate. See 10/31 phone note. In his letter, he refers to the magnesium supplement as magnesium hydroxide but just want to be clear we did not recommend magnesium hydroxide, but instead magnesium oxide. Magnesium hydroxide is a different kind of magnesium frequently found in laxatives and antacids. Magnesium oxide is a means of magnesium replacement. As per prior recommendation he should definitely discuss starting this supplement with the provider who plans to start mycophenolate so that they are aware. He inquired if he could take a Mag Complex pill at [facility] containing a combination of magnesium oxide + magnesium citrate + magnesium aspartate. The magnesium oxide is what we had suggested he take, which appears to be a component of that supplement. While it would be OK for him to try that supplement, the supplement amounts may not be regulated and therefore difficult to titrate to achieve appropriate clinical response. Additionally, magnesium citrate can provoke GI upset so if the dose needed to be increased, we might not be able to do so with that supplement. In cardiology, we typically choose magnesium oxide for its standardized dosing and understanding of how the bioavailability converts to expected rise in Magnesium level. So if he is going to take magnesium, instead of doing OTC that contains magnesium oxide as an ingredient amongst other versions, I would suggest doing magnesium oxide its own (as previously recommended in last visit's result note, after clearing with mycophenolate prescriber). Addendum: based on the magnesium level that just came back even lower at 1.2, I doubt his PVCs will markedly improve until this is  treated.  2. PVCs - Since acebutolol is not effectively suppressing the PVCs, let's do the following. Stop acebutolol and start atenolol - would start 50mg  daily to see if that helps. If he finds this is helping but has breakthrough symptoms in the evening, let us know. Blood pressure appears controlled but given intermittent lower readings (as low as 99/61), he can further cut his lisinopril down to 20mg  - (assuming he did cut it to 40mg  ONCE daily as suggested in the prior result note). Await nuc/echo.   3. He shared a case study that relates metformin to a cause of atrial fibrillation. It is difficult to know how this relates to his study because atrial fibrillation (different than what he has) is a rhythm that traditionally presents itself when the body perceives something is different or has changed. PVCs can be the same mechanism but sometimes we don't know why someone is having these. In his case, aside from the metformin, he also has plenty of physiologic stressors which could be causing the uptick. Given his concerns he should review plan for diabetes treatment with his PCP.  I appreciate the opportunity to participate in his care and am sorry he is going through so much lately!  Cherrise Occhipinti PA-C

## 2018-01-01 NOTE — Addendum Note (Signed)
Addended by: Gaetano Net on: 01/01/2018 12:49 PM   Modules accepted: Orders

## 2018-01-02 ENCOUNTER — Encounter (HOSPITAL_COMMUNITY): Payer: Self-pay

## 2018-01-05 ENCOUNTER — Encounter (HOSPITAL_COMMUNITY): Payer: Self-pay

## 2018-01-05 ENCOUNTER — Telehealth: Payer: Self-pay | Admitting: Cardiovascular Disease

## 2018-01-05 ENCOUNTER — Telehealth (HOSPITAL_COMMUNITY): Payer: Self-pay | Admitting: *Deleted

## 2018-01-05 NOTE — Telephone Encounter (Signed)
Patient given detailed instructions per Myocardial Perfusion Study Information Sheet for the test on 01/08/18. Patient notified to arrive 15 minutes early and that it is imperative to arrive on time for appointment to keep from having the test rescheduled.  If you need to cancel or reschedule your appointment, please call the office within 24 hours of your appointment. . Patient verbalized understanding. Kirstie Peri

## 2018-01-05 NOTE — Telephone Encounter (Signed)
New Message   Pt c/o medication issue:  1. Name of Medication: cellcept  2. How are you currently taking this medication (dosage and times per day)?   3. Are you having a reaction (difficulty breathing--STAT)?   4. What is your medication issue? Patient is calling because he is going to have a nuclear stress test but he is to start cellcept. He wants to know will there be any issues between the medication with the nuclear stress test and the cellcept. Please call to discuss.

## 2018-01-05 NOTE — Telephone Encounter (Signed)
Nuclear medicine contacted pt

## 2018-01-06 ENCOUNTER — Telehealth (HOSPITAL_COMMUNITY): Payer: Self-pay

## 2018-01-06 NOTE — Telephone Encounter (Signed)
Detailed message left for the pt for his stress test. Asked to call back with any questions. Samuel Coleman EMTPD

## 2018-01-07 ENCOUNTER — Encounter (HOSPITAL_COMMUNITY): Payer: Self-pay

## 2018-01-08 ENCOUNTER — Ambulatory Visit (HOSPITAL_BASED_OUTPATIENT_CLINIC_OR_DEPARTMENT_OTHER): Payer: Medicare Other

## 2018-01-08 ENCOUNTER — Ambulatory Visit (HOSPITAL_COMMUNITY): Payer: Medicare Other | Attending: Internal Medicine

## 2018-01-08 ENCOUNTER — Other Ambulatory Visit: Payer: Self-pay

## 2018-01-08 DIAGNOSIS — I493 Ventricular premature depolarization: Secondary | ICD-10-CM

## 2018-01-08 LAB — MYOCARDIAL PERFUSION IMAGING
CHL CUP NUCLEAR SDS: 0
CHL CUP NUCLEAR SRS: 0
CSEPPHR: 99 {beats}/min
LV sys vol: 22 mL
LVDIAVOL: 75 mL (ref 62–150)
NUC STRESS TID: 1.05
Rest HR: 78 {beats}/min
SSS: 0

## 2018-01-08 LAB — ECHOCARDIOGRAM COMPLETE
HEIGHTINCHES: 69 in
WEIGHTICAEL: 2864 [oz_av]

## 2018-01-08 MED ORDER — TECHNETIUM TC 99M TETROFOSMIN IV KIT
11.0000 | PACK | Freq: Once | INTRAVENOUS | Status: AC | PRN
  Administered 2018-01-08: 11 via INTRAVENOUS
  Filled 2018-01-08: qty 11

## 2018-01-08 MED ORDER — PERFLUTREN LIPID MICROSPHERE
1.0000 mL | INTRAVENOUS | Status: AC | PRN
  Administered 2018-01-08: 2 mL via INTRAVENOUS

## 2018-01-08 MED ORDER — REGADENOSON 0.4 MG/5ML IV SOLN
0.4000 mg | Freq: Once | INTRAVENOUS | Status: AC
  Administered 2018-01-08: 0.4 mg via INTRAVENOUS

## 2018-01-08 MED ORDER — TECHNETIUM TC 99M TETROFOSMIN IV KIT
31.7000 | PACK | Freq: Once | INTRAVENOUS | Status: AC | PRN
  Administered 2018-01-08: 31.7 via INTRAVENOUS
  Filled 2018-01-08: qty 32

## 2018-01-09 ENCOUNTER — Encounter (HOSPITAL_COMMUNITY)
Admission: RE | Admit: 2018-01-09 | Discharge: 2018-01-09 | Disposition: A | Discharge status: Home / Self Care | Payer: Self-pay | Source: Ambulatory Visit | Attending: Cardiovascular Disease | Admitting: Cardiovascular Disease

## 2018-01-12 ENCOUNTER — Encounter (HOSPITAL_COMMUNITY): Payer: Self-pay

## 2018-01-14 ENCOUNTER — Telehealth: Payer: Self-pay

## 2018-01-14 ENCOUNTER — Encounter (HOSPITAL_COMMUNITY): Payer: Self-pay

## 2018-01-14 NOTE — Telephone Encounter (Signed)
-----   Message from Charlie Pitter, [place] sent at 01/09/2018  6:04 AM EST ----- Please let patient know echo is reassuring. Some thickness of heart muscle, but heart is strong. Continue plan as discussed. Now that he is on Mag would suggest mag level within about a week of starting supplement (please clarify date with patient as he initially expressed hesitance) Keep f/u with Dr. Isabelle Course PA-C

## 2018-01-14 NOTE — Telephone Encounter (Signed)
Notes recorded by Frederik Schmidt, RN on 01/14/2018 at 9:08 AM EST The patient has been notified of the result and verbalized understanding. All questions (if any) were answered. Frederik Schmidt, RN 01/14/2018 9:08 AM

## 2018-01-15 ENCOUNTER — Ambulatory Visit: Payer: Medicare Other | Admitting: Cardiovascular Disease

## 2018-01-15 ENCOUNTER — Encounter: Payer: Self-pay | Admitting: Cardiovascular Disease

## 2018-01-15 VITALS — BP 120/68 | HR 93 | Ht 69.0 in | Wt 174.8 lb

## 2018-01-15 DIAGNOSIS — I493 Ventricular premature depolarization: Secondary | ICD-10-CM | POA: Diagnosis not present

## 2018-01-15 DIAGNOSIS — I2510 Atherosclerotic heart disease of native coronary artery without angina pectoris: Secondary | ICD-10-CM | POA: Diagnosis not present

## 2018-01-15 DIAGNOSIS — I10 Essential (primary) hypertension: Secondary | ICD-10-CM

## 2018-01-15 MED ORDER — METOPROLOL TARTRATE 50 MG PO TABS: 75.0000 mg | ORAL_TABLET | Freq: Two times a day (BID) | ORAL | 3 refills | Status: DC

## 2018-01-15 NOTE — Patient Instructions (Signed)
Medication Instructions:  Your physician has recommended you make the following change in your medication:  Stop atenolol Resume metoprolol tartrate 75 mg by mouth twice daily.   If you need a refill on your cardiac medications before your next appointment, please call your pharmacy.   Lab work: none If you have labs (blood work) drawn today and your tests are completely normal, you will receive your results only by: Marland Kitchen MyChart Message (if you have MyChart) OR . A paper copy in the mail If you have any lab test that is abnormal or we need to change your treatment, we will call you to review the results.  Testing/Procedures: none  Follow-Up: At [facility], you and your health needs are our priority.  As part of our continuing mission to provide you with exceptional heart care, we have created designated Provider Care Teams.  These Care Teams include your primary Cardiologist (physician) and Advanced Practice Providers (APPs -  Physician Assistants and Nurse Practitioners) who all work together to provide you with the care you need, when you need it. You will need a follow up appointment in 6 months.  Please call our office 2 months in advance to schedule this appointment.  You may see Lauree Chandler, MD or one of the following Advanced Practice Providers on your designated Care Team:   [place], PA-C Melina Copa, PA-C . Ermalinda Barrios, PA-C  Any Other Special Instructions Will Be Listed Below (If Applicable).

## 2018-01-15 NOTE — Progress Notes (Signed)
Chief Complaint  Patient presents with  . Follow-up    CAD   History of Present Illness: 72 yo male with history of CAD, HLD, HTN, thoracic aortic aneurysm and carotid artery disease who is here today for cardiac follow up. He was admitted to [facility] November 2011 with an inferior STEMI. Emergent cardiac cath showed severe three vessel CAD. He underwent 3V CABG later that day with Dr. Cyndia Bent. (LIMA to LAD, SVG to OM, SVG to RCA). He was admitted to [facility] August 2013 for abdominal pain and felt to have ischemic colitis and his beta blocker dose was reduced. He also had some bradycardia. He was seen by Truitt Merle, NP in January 2014 and was doing well but c/o skipped beats. Event monitor with PVCs, NSR. Stress myoview without ischemia April 2014. Stress myoview February 2018 showed no ischemia. Echo October 2018 with normal LV function, LVEF=55-60%. No valve disease. Thoracic aortic aneurysm stable by CTA October 2018. Carotid dopplers with mild bilateral disease February 2018. He was seen in our office 12/23/17 by Melina Copa, PA-C with c/o decreased exercise tolerance. PVCs noted in cardiac rehab. He described recent persistent recurrent swelling of his ear initially treated as infection with rounds of doxycycline and possible MRA. 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 He has been on several rounds of prednisone. The week prior to that office visit, he notticed increased fatigue and PVCs. They seem to be worse after lunch, lasting several hours before dissipating. Not worsened with exertion. Lopressor was increased via phone note to 75mg  BID but did not have any effect. At some point in the past he was on atenolol but reports it was changed around the time of his CABG for unclear reasons. Of note he was seen in ER 12/03/17 for  atypical chest pain that presented as upper abdominal/lower chest/flank/back discomfort. It was not worse with any particular measures and did not improve with NTG en route. It lasted about 2 hours and semi-reminded him of MI pain. It resolved with morphine. CT angio of the chest 11/2017 showed no dissection, nonspecific enhancement of extrahepatic bile ducts, nonobstructing distal R ureter, bilateral nephrolithasis, left inguinal hernia with loop of descending colon without obstruction, saccular thoracic aneurysm within proximal descending thoracic aorta measuring 3.6x3.3cm (no change from prior). He returned to cardiac rehab following this episode and continued to have fatigue.  His metoprolol was stopped and he was started on acebutolol for PVC suppression. Nuclear stress test 01/08/18 with no ischemia. LVEF=71%. Echo 01/08/18 with LVEF=60-65%. Moderate LVH. PVCs noted during study. No improvement on acebutolol so he was recommended to start on atenolol 50 mg daily. He did not take this yet. He restarted his metoprolol at home and has felt somewhat better. He has been diagnosed with diabetes and is on metformin and Januvia.   He is here today for follow up. The patient denies any chest pain, dyspnea, lower extremity edema, orthopnea, PND, dizziness, near syncope or syncope. He has rare palpitations.   Primary Care Physician: Jani Gravel, MD  Past Medical History:  Diagnosis Date  . Anxiety   . Arthritis   . Asthma    exercise induced  . Back pain, chronic    gets injections in back  . CAD (coronary artery disease)    a.  prior inferior MI; s/p 3V CABG 01/01/10 (LIMA  to LAD, SVG to OM, SVG to RCA- Dr.Bartle);  b. Last Myoview (05/2012):  Normal; EF 70% // c. Myoview 2/18: EF 60, no ischemia or infarction, Normal Study  . Carotid stenosis    Carotid US 03/2016 1-39% BICA  . Diabetes mellitus type 2 in nonobese (HCC)   . Frequent PVCs   . GERD (gastroesophageal reflux disease)   . Gout   . History  of kidney stones    no problems with stones  . Hx of echocardiogram    Echo (01/2010):  EF 55-60%, ? Apical HK, mild BAE, atrial septal aneurysm.  Marland Kitchen Hyperlipidemia   . Hypertension   . Ischemic colitis ([place])    noted on CT back in August 2013 possible related to hypotension  . Pneumonia    x 2  . Premature atrial contractions   . PUD (peptic ulcer disease) 1974  . ST elevation myocardial infarction (STEMI) of inferior wall ([place]) 2011   s/p CABG 2011  . Thoracic aortic aneurysm ([place])    a. last assessed 11/2017 3.6x3.3cm.  . 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 CARDIAC CATHETERIZATION    . CHEST TUBE INSERTION  1992   fx lt ribs-collapsed lung  . CORONARY ARTERY BYPASS GRAFT  11/11   X 3 VESSELS  . cyst removed from back of neck    . cyst removel on right buttocks - 2014    . EAR CYST EXCISION Left 09/02/2012   Procedure: EXCISION SEBACEOUS CYST LEFT POSTERIOR LEG;  Surgeon: Pedro Earls, MD;  Location: [facility];  Service: General;  Laterality: Left;  . Excision of deep left neck mass. SURGEON:  Leonides Sake. Lucia Gaskins, M.D.  Cramer.Melena  . EYE SURGERY  both cararacts  . HERNIA REPAIR    . INGUINAL HERNIA REPAIR Right 12/02/2012   Procedure: OPEN RIGHT INGUINAL HERINA;  Surgeon: Pedro Earls, MD;  Location: [facility];  Service: General;  Laterality: Right;  . INGUINAL HERNIA REPAIR Left 11/02/2014   Procedure: OPEN LEFT INGUINAL HERNIA REPAIR WITH MESH;  Surgeon: Johnathan Hausen, MD;  Location: [facility];  Service: General;  Laterality: Left;  . INSERTION OF MESH Right 12/02/2012   Procedure: INSERTION OF MESH;  Surgeon: Pedro Earls, MD;  Location: [facility];  Service: General;  Laterality: Right;  . LUMBAR LAMINECTOMY/DECOMPRESSION MICRODISCECTOMY N/A 01/26/2015   Procedure: Lumbar 5-sacrum 1 decompression;  Surgeon: Phylliss Bob, MD;  Location: [place];  Service: Orthopedics;   Laterality: N/A;  . TONSILLECTOMY      Current Outpatient Medications  Medication Sig Dispense Refill  . allopurinol (ZYLOPRIM) 300 MG tablet Take 300 mg by mouth every morning.     . Ascorbic Acid (VITAMIN C) 1000 MG tablet Take 1,000 mg by mouth every morning.     Marland Kitchen aspirin 81 MG tablet Take 81 mg by mouth daily.    . Cholecalciferol (VITAMIN D3) 3000 UNITS TABS Take 3,000 Units by mouth daily.     . clotrimazole-betamethasone (LOTRISONE) cream Apply 1 application topically See admin instructions. Apply 2 times a day to both feet  1  . dexlansoprazole (DEXILANT) 60 MG capsule Take 60 mg by mouth every morning.     . hydrocortisone cream 1 % Apply 1 application topically as needed (for itching on ears or feet).     Marland Kitchen lisinopril (PRINIVIL,ZESTRIL) 40 MG tablet Take 1 tablet (40 mg total) by mouth daily. [place]  tablet 3  . Magnesium Oxide 400 (240 Mg) MG TABS Take 1 tablet (400 mg total) by mouth 2 (two) times daily. 180 tablet 3  . metFORMIN (GLUCOPHAGE) 1000 MG tablet Take 1,000 mg by mouth 2 (two) times daily with a meal.  6  . methocarbamol (ROBAXIN) 500 MG tablet Take 500 mg by mouth 4 (four) times daily as needed for muscle spasms.     . Multiple Vitamin (MULTIVITAMIN WITH MINERALS) TABS tablet Take 1 tablet by mouth every morning.     . mupirocin ointment (BACTROBAN) 2 % Apply 1 application topically See admin instructions. Apply to both nostrils 2 times a day for the first 5 days of each month for 6 months  6  . neomycin-bacitracin-polymyxin (NEOSPORIN) [phone] ointment Apply 1 application topically 2 (two) times daily as needed (FOR FOOT IRRITATION).     Marland Kitchen nitroGLYCERIN (NITROSTAT) 0.4 MG SL tablet Place 1 tablet (0.4 mg total) under the tongue every 5 (five) minutes as needed for chest pain. 25 tablet 3  . predniSONE (DELTASONE) 10 MG tablet Take 10-20 mg by mouth See admin instructions. Take 20 mg by mouth once a day for 14 days then 15 mg once a day for 14 days then 10 mg once a day  for 14 days  0  . silver sulfADIAZINE (SILVADENE) 1 % cream Apply 1 application topically as needed (to affected areas of ears as directed).     . simvastatin (ZOCOR) 40 MG tablet Take 40 mg by mouth daily.     . ursodiol (ACTIGALL) 300 MG capsule Take 300 mg by mouth 3 (three) times daily.  12  . vitamin B-12 (CYANOCOBALAMIN) 500 MCG tablet Take 500 mcg by mouth daily.    . vitamin E 400 UNIT capsule Take 400 Units by mouth daily.    . metoprolol tartrate (LOPRESSOR) 50 MG tablet Take 1.5 tablets (75 mg total) by mouth 2 (two) times daily. 90 tablet 3   No current facility-administered medications for this visit.     Allergies  Allergen Reactions  . Clindamycin/Lincomycin Itching and Rash    All-over itching; required a course of Prednisone to treat  . Levofloxacin Nausea Only and Other (See Comments)    Insomnia, also    Social History   Socioeconomic History  . Marital status: Widowed    Spouse name: Not on file  . Number of children: Not on file  . Years of education: Not on file  . Highest education level: Not on file  Occupational History  . Not on file  Social Needs  . Financial resource strain: Not on file  . Food insecurity:    Worry: Not on file    Inability: Not on file  . Transportation needs:    Medical: Not on file    Non-medical: Not on file  Tobacco Use  . Smoking status: Former Smoker    Last attempt to quit: 02/25/1974    Years since quitting: 43.9  . Smokeless tobacco: Never Used  Substance and Sexual Activity  . Alcohol use: Yes    Comment: rarely  . Drug use: No  . Sexual activity: Not on file  Lifestyle  . Physical activity:    Days per week: Not on file    Minutes per session: Not on file  . Stress: Not on file  Relationships  . Social connections:    Talks on phone: Not on file    Gets together: Not on file    Attends religious  service: Not on file    Active member of club or organization: Not on file    Attends meetings of clubs or  organizations: Not on file    Relationship status: Not on file  . Intimate partner violence:    Fear of current or ex partner: Not on file    Emotionally abused: Not on file    Physically abused: Not on file    Forced sexual activity: Not on file  Other Topics Concern  . Not on file  Social History Narrative  . Not on file    Family History  Problem Relation Age of Onset  . Heart failure Mother   . Pneumonia Father   . Lung cancer Sister   . Esophageal cancer Sister   . Pancreatic cancer Maternal Grandmother     Review of Systems:  As stated in the HPI and otherwise negative.   BP 120/68   Pulse 93   Ht 5\' 9"  (1.753 m)   Wt 174 lb 12.8 oz (79.3 kg)   SpO2 99%   BMI 25.81 kg/m   Physical Examination:  General: Well developed, well nourished, NAD  HEENT: OP clear, mucus membranes moist  SKIN: warm, dry. No rashes. Neuro: No focal deficits  Musculoskeletal: Muscle strength 5/5 all ext  Psychiatric: Mood and affect normal  Neck: No JVD, no carotid bruits, no thyromegaly, no lymphadenopathy.  Lungs:Clear bilaterally, no wheezes, rhonci, crackles Cardiovascular: Regular rate and rhythm. No murmurs, gallops or rubs. Abdomen:Soft. Bowel sounds present. Non-tender.  Extremities: No lower extremity edema. Pulses are 2 + in the bilateral DP/PT.  Echo 01/08/18: Left ventricle: The cavity size was normal. Wall thickness was   increased in a pattern of moderate LVH. Systolic function was   normal. The estimated ejection fraction was in the range of 60%   to 65%. Wall motion was normal; there were no regional wall   motion abnormalities. The E/e&' ratio is between 8-15, suggesting   indeterminate LV filling pressure. - Left atrium: The atrium was normal in size. - Inferior vena cava: The vessel was normal in size. The   respirophasic diameter changes were in the normal range (= 50%),   consistent with normal central venous pressure.  Impressions:  - Compared to a prior  study in 11/2016, the LVEF is higher at   60-65%. Frequent PVC&'s were noted.  EKG:  EKG is not ordered today. The ekg ordered today demonstrates   Recent Labs: 07/17/2017: B Natriuretic Peptide 56.8 08/11/2017: ALT 52 12/23/2017: Hemoglobin 14.6; Platelets 244; TSH 0.357 12/31/2017: BUN 33; Creatinine, Ser 0.85; Magnesium 1.2; Potassium 4.8; Sodium 133   Lipid Panel No results found for: CHOL, TRIG, HDL, CHOLHDL, VLDL, LDLCALC, LDLDIRECT   Wt Readings from Last 3 Encounters:  01/15/18 174 lb 12.8 oz (79.3 kg)  01/08/18 179 lb (81.2 kg)  12/23/17 179 lb 6.4 oz (81.4 kg)     Other studies Reviewed: Additional studies/ records that were reviewed today include: . Review of the above records demonstrates:    Assessment and Plan:   1. CAD without angina: He has no chest pain. Nuclear stress test 01/08/18 with no ischemia. LV function normal by echo 01/08/18. Continue ASA, statin and beta blocker.     2. HTN: BP is controlled.   3. HLD: Lipids followed in primary care. Will continue statin.   4. Thoracic aortic aneurysm: CTA October 2019 with stable aneurysm in the proximal descending thoracic aorta.   5. PVCs: He is feeling better  now. Less palpitations. Continue metoprolol 75 mg po BID  6. Carotid artery disease: Mild disease by dopplers march 2019.   Current medicines are reviewed at length with the patient today.  The patient does not have concerns regarding medicines.  The following changes have been made:  no change  Labs/ tests ordered today include:   No orders of the defined types were placed in this encounter.   Disposition:   FU with me in 6 months   Signed, Lauree Chandler, MD 01/15/2018 2:35 PM    [facility] [address], [place], [place]  [address] Phone: [phone]; Fax: [phone]

## 2018-01-16 ENCOUNTER — Encounter (HOSPITAL_COMMUNITY): Payer: Self-pay

## 2018-01-19 ENCOUNTER — Encounter (HOSPITAL_COMMUNITY): Payer: Self-pay

## 2018-01-20 ENCOUNTER — Telehealth: Payer: Self-pay | Admitting: *Deleted

## 2018-01-20 ENCOUNTER — Other Ambulatory Visit: Payer: Medicare Other | Admitting: *Deleted

## 2018-01-20 DIAGNOSIS — R002 Palpitations: Secondary | ICD-10-CM

## 2018-01-20 DIAGNOSIS — R790 Abnormal level of blood mineral: Secondary | ICD-10-CM

## 2018-01-20 NOTE — Telephone Encounter (Signed)
I spoke with pt. He has not had Magnesium level checked elsewhere.  He will come in today for lab work.  Pt confirms he is taking Magnesium Oxide 400 mg twice daily.

## 2018-01-20 NOTE — Telephone Encounter (Signed)
I placed call to pt to schedule follow up Magnesium level.  Left message to call office.

## 2018-01-21 ENCOUNTER — Telehealth: Payer: Self-pay | Admitting: *Deleted

## 2018-01-21 ENCOUNTER — Encounter (HOSPITAL_COMMUNITY): Payer: Self-pay

## 2018-01-21 LAB — MAGNESIUM: MAGNESIUM: 1.1 mg/dL — AB (ref 1.6–2.3)

## 2018-01-21 MED ORDER — MAGNESIUM OXIDE -MG SUPPLEMENT 400 (240 MG) MG PO TABS: 800.0000 mg | ORAL_TABLET | Freq: Two times a day (BID) | ORAL | 1 refills | Status: DC

## 2018-01-21 NOTE — Telephone Encounter (Signed)
-----   Message from [name], MD sent at 01/21/2018 12:03 PM EST ----- His magnesium is still low. I have discussed with our PharmD. Will increase Mag Ox to 800 mg BID. This may cause frequent bowel movements. He will need to follow up in primary care or rheumatology to look for causes of his low magnesium level. He will need a repeat level in one week but I would prefer this be done in his primary care office so they can follow and make further recommendations for evaluation and treatment. Samuel Coleman

## 2018-01-21 NOTE — Telephone Encounter (Signed)
I spoke with pt and gave him information from Dr. Angelena Form.  I told pt this information was also sent to him through my chart.  [facility] [address].

## 2018-01-26 ENCOUNTER — Encounter (HOSPITAL_COMMUNITY): Payer: Self-pay

## 2018-01-26 DIAGNOSIS — Z951 Presence of aortocoronary bypass graft: Secondary | ICD-10-CM | POA: Insufficient documentation

## 2018-01-26 DIAGNOSIS — I252 Old myocardial infarction: Secondary | ICD-10-CM | POA: Insufficient documentation

## 2018-01-28 ENCOUNTER — Encounter (HOSPITAL_COMMUNITY): Payer: Self-pay

## 2018-01-30 ENCOUNTER — Encounter (HOSPITAL_COMMUNITY)
Admission: RE | Admit: 2018-01-30 | Discharge: 2018-01-30 | Disposition: A | Discharge status: Home / Self Care | Payer: Self-pay | Source: Ambulatory Visit | Attending: Cardiovascular Disease | Admitting: Cardiovascular Disease

## 2018-02-02 ENCOUNTER — Encounter (HOSPITAL_COMMUNITY)
Admission: RE | Admit: 2018-02-02 | Discharge: 2018-02-02 | Disposition: A | Discharge status: Home / Self Care | Payer: Medicare Other | Source: Ambulatory Visit | Attending: Cardiovascular Disease | Admitting: Cardiovascular Disease

## 2018-02-04 ENCOUNTER — Encounter (HOSPITAL_COMMUNITY): Payer: Self-pay

## 2018-02-06 ENCOUNTER — Encounter (HOSPITAL_COMMUNITY)
Admission: RE | Admit: 2018-02-06 | Discharge: 2018-02-06 | Disposition: A | Discharge status: Home / Self Care | Payer: Medicare Other | Source: Ambulatory Visit | Attending: Cardiovascular Disease | Admitting: Cardiovascular Disease

## 2018-02-09 ENCOUNTER — Encounter (HOSPITAL_COMMUNITY): Payer: Self-pay

## 2018-02-11 ENCOUNTER — Encounter (HOSPITAL_COMMUNITY)
Admission: RE | Admit: 2018-02-11 | Discharge: 2018-02-11 | Disposition: A | Discharge status: Home / Self Care | Payer: Self-pay | Source: Ambulatory Visit | Attending: Cardiovascular Disease | Admitting: Cardiovascular Disease

## 2018-02-13 ENCOUNTER — Encounter (HOSPITAL_COMMUNITY)
Admission: RE | Admit: 2018-02-13 | Discharge: 2018-02-13 | Disposition: A | Discharge status: Home / Self Care | Payer: Self-pay | Source: Ambulatory Visit | Attending: Cardiovascular Disease | Admitting: Cardiovascular Disease

## 2018-02-16 ENCOUNTER — Encounter (HOSPITAL_COMMUNITY)
Admission: RE | Admit: 2018-02-16 | Discharge: 2018-02-16 | Disposition: A | Discharge status: Home / Self Care | Payer: Self-pay | Source: Ambulatory Visit | Attending: Cardiovascular Disease | Admitting: Cardiovascular Disease

## 2018-02-20 ENCOUNTER — Encounter (HOSPITAL_COMMUNITY)
Admission: RE | Admit: 2018-02-20 | Discharge: 2018-02-20 | Disposition: A | Discharge status: Home / Self Care | Payer: Self-pay | Source: Ambulatory Visit | Attending: Cardiovascular Disease | Admitting: Cardiovascular Disease

## 2018-02-23 ENCOUNTER — Encounter (HOSPITAL_COMMUNITY): Payer: Self-pay

## 2018-02-27 ENCOUNTER — Encounter (HOSPITAL_COMMUNITY)
Admission: RE | Admit: 2018-02-27 | Discharge: 2018-02-27 | Disposition: A | Discharge status: Home / Self Care | Payer: Self-pay | Source: Ambulatory Visit | Attending: Cardiovascular Disease | Admitting: Cardiovascular Disease

## 2018-02-27 DIAGNOSIS — I252 Old myocardial infarction: Secondary | ICD-10-CM | POA: Insufficient documentation

## 2018-02-27 DIAGNOSIS — Z951 Presence of aortocoronary bypass graft: Secondary | ICD-10-CM | POA: Insufficient documentation

## 2018-03-02 ENCOUNTER — Encounter (HOSPITAL_COMMUNITY): Payer: Self-pay

## 2018-03-04 ENCOUNTER — Encounter (HOSPITAL_COMMUNITY)
Admission: RE | Admit: 2018-03-04 | Discharge: 2018-03-04 | Disposition: A | Discharge status: Home / Self Care | Payer: Self-pay | Source: Ambulatory Visit | Attending: Cardiovascular Disease | Admitting: Cardiovascular Disease

## 2018-03-06 ENCOUNTER — Encounter (HOSPITAL_COMMUNITY)
Admission: RE | Admit: 2018-03-06 | Discharge: 2018-03-06 | Disposition: A | Discharge status: Home / Self Care | Payer: Self-pay | Source: Ambulatory Visit | Attending: Cardiovascular Disease | Admitting: Cardiovascular Disease

## 2018-03-09 ENCOUNTER — Encounter (HOSPITAL_COMMUNITY): Payer: Self-pay

## 2018-03-11 ENCOUNTER — Telehealth: Payer: Self-pay | Admitting: Cardiovascular Disease

## 2018-03-11 ENCOUNTER — Encounter (HOSPITAL_COMMUNITY)
Admission: RE | Admit: 2018-03-11 | Discharge: 2018-03-11 | Disposition: A | Discharge status: Home / Self Care | Payer: Self-pay | Source: Ambulatory Visit | Attending: Cardiovascular Disease | Admitting: Cardiovascular Disease

## 2018-03-11 NOTE — Telephone Encounter (Incomplete)
New Message           *STAT* If patient is at the pharmacy, call can be transferred to refill team.   1. Which medications need to be refilled? (please list name of each medication and dose if known) Magnesium  2. Which pharmacy/location (including street and city if local pharmacy) is medication to be sent to? [facility] [address]  3. Do they need a 30 day or 90 day supply? 90        Patient states he is running out to soon, he was told to take 2 tab x 2 times a day per nurse

## 2018-03-13 ENCOUNTER — Encounter (HOSPITAL_COMMUNITY)
Admission: RE | Admit: 2018-03-13 | Discharge: 2018-03-13 | Disposition: A | Discharge status: Home / Self Care | Payer: Self-pay | Source: Ambulatory Visit | Attending: Cardiovascular Disease | Admitting: Cardiovascular Disease

## 2018-03-13 MED ORDER — MAGNESIUM OXIDE -MG SUPPLEMENT 400 (240 MG) MG PO TABS: 800.0000 mg | ORAL_TABLET | Freq: Two times a day (BID) | ORAL | 0 refills | Status: DC

## 2018-03-13 NOTE — Telephone Encounter (Signed)
OK to refill for one week and then make sure he gets into primary care for lab work or into our office for labs. Thanks, chris

## 2018-03-13 NOTE — Telephone Encounter (Signed)
I spoke with pt and gave him information from Dr. Angelena Form.  He is having blood work checked next Wednesday at primary care.  Will send week supply to pt's pharmacy.  Further refills will be from primary care.

## 2018-03-13 NOTE — Telephone Encounter (Signed)
I spoke with pt. Based on 01/21/18 phone note he was to have lab work checked in primary care in one week.   Pt reports he did not have this done and has not seen primary care or rheumatology.  I told pt he would need lab work checked and I did not feel comfortable refilling until lab checked.  He will contact primary care to see if lab work can be done there.  He will let me know.

## 2018-03-13 NOTE — Telephone Encounter (Signed)
I called pt to follow up. He is waiting to hear back from primary care.  Pt has 2 magnesium oxide tablets left.  He is asking if we could refill short term.  I offered to have pt come into our office for stat lab work but pt would prefer not to do this today as he has only been taking 2 tablets daily for last 3-4 days.  He has difficulty taking full dose daily as he has to space medications out due to interactions. Will forward to Dr. Angelena Form to see if OK to refill one week supply for pt.

## 2018-03-16 ENCOUNTER — Encounter (HOSPITAL_COMMUNITY)
Admission: RE | Admit: 2018-03-16 | Discharge: 2018-03-16 | Disposition: A | Discharge status: Home / Self Care | Payer: Self-pay | Source: Ambulatory Visit | Attending: Cardiovascular Disease | Admitting: Cardiovascular Disease

## 2018-03-18 ENCOUNTER — Encounter (HOSPITAL_COMMUNITY): Payer: Self-pay

## 2018-03-20 ENCOUNTER — Other Ambulatory Visit: Payer: Self-pay | Admitting: Gastroenterology

## 2018-03-20 ENCOUNTER — Encounter (HOSPITAL_COMMUNITY)
Admission: RE | Admit: 2018-03-20 | Discharge: 2018-03-20 | Disposition: A | Discharge status: Home / Self Care | Payer: Self-pay | Source: Ambulatory Visit | Attending: Cardiovascular Disease | Admitting: Cardiovascular Disease

## 2018-03-23 ENCOUNTER — Encounter (HOSPITAL_COMMUNITY)
Admission: RE | Admit: 2018-03-23 | Discharge: 2018-03-23 | Disposition: A | Discharge status: Home / Self Care | Payer: Self-pay | Source: Ambulatory Visit | Attending: Cardiovascular Disease | Admitting: Cardiovascular Disease

## 2018-03-25 ENCOUNTER — Encounter (HOSPITAL_COMMUNITY)
Admission: RE | Admit: 2018-03-25 | Discharge: 2018-03-25 | Disposition: A | Discharge status: Home / Self Care | Payer: Self-pay | Source: Ambulatory Visit | Attending: Cardiovascular Disease | Admitting: Cardiovascular Disease

## 2018-03-27 ENCOUNTER — Encounter (HOSPITAL_COMMUNITY): Payer: Self-pay

## 2018-03-28 ENCOUNTER — Ambulatory Visit
Admission: RE | Admit: 2018-03-28 | Discharge: 2018-03-28 | Disposition: A | Discharge status: Home / Self Care | Payer: Medicare Other | Source: Ambulatory Visit | Attending: Gastroenterology | Admitting: Gastroenterology

## 2018-03-28 MED ORDER — GADOBENATE DIMEGLUMINE 529 MG/ML IV SOLN
15.0000 mL | Freq: Once | INTRAVENOUS | Status: AC | PRN
  Administered 2018-03-28: 15 mL via INTRAVENOUS

## 2018-03-30 ENCOUNTER — Encounter (HOSPITAL_COMMUNITY)
Admission: RE | Admit: 2018-03-30 | Discharge: 2018-03-30 | Disposition: A | Discharge status: Home / Self Care | Payer: Self-pay | Source: Ambulatory Visit | Attending: Cardiovascular Disease | Admitting: Cardiovascular Disease

## 2018-03-30 DIAGNOSIS — E785 Hyperlipidemia, unspecified: Secondary | ICD-10-CM | POA: Insufficient documentation

## 2018-03-30 DIAGNOSIS — I2510 Atherosclerotic heart disease of native coronary artery without angina pectoris: Secondary | ICD-10-CM | POA: Insufficient documentation

## 2018-03-30 DIAGNOSIS — I10 Essential (primary) hypertension: Secondary | ICD-10-CM | POA: Insufficient documentation

## 2018-03-30 DIAGNOSIS — I712 Thoracic aortic aneurysm, without rupture: Secondary | ICD-10-CM | POA: Insufficient documentation

## 2018-03-30 DIAGNOSIS — I6523 Occlusion and stenosis of bilateral carotid arteries: Secondary | ICD-10-CM | POA: Insufficient documentation

## 2018-03-30 DIAGNOSIS — I493 Ventricular premature depolarization: Secondary | ICD-10-CM | POA: Insufficient documentation

## 2018-04-01 ENCOUNTER — Encounter (HOSPITAL_COMMUNITY): Payer: Self-pay

## 2018-04-03 ENCOUNTER — Encounter (HOSPITAL_COMMUNITY)
Admission: RE | Admit: 2018-04-03 | Discharge: 2018-04-03 | Disposition: A | Discharge status: Home / Self Care | Payer: Self-pay | Source: Ambulatory Visit | Attending: Cardiovascular Disease | Admitting: Cardiovascular Disease

## 2018-04-06 ENCOUNTER — Encounter (HOSPITAL_COMMUNITY): Payer: Self-pay

## 2018-04-06 ENCOUNTER — Other Ambulatory Visit: Payer: Self-pay | Admitting: Gastroenterology

## 2018-04-08 ENCOUNTER — Encounter (HOSPITAL_COMMUNITY)
Admission: RE | Admit: 2018-04-08 | Discharge: 2018-04-08 | Disposition: A | Discharge status: Home / Self Care | Payer: Self-pay | Source: Ambulatory Visit | Attending: Cardiovascular Disease | Admitting: Cardiovascular Disease

## 2018-04-09 ENCOUNTER — Ambulatory Visit: Payer: Medicare Other | Admitting: Cardiovascular Disease

## 2018-04-09 ENCOUNTER — Encounter: Payer: Self-pay | Admitting: Cardiovascular Disease

## 2018-04-09 VITALS — BP 116/72 | HR 85 | Ht 69.0 in | Wt 160.8 lb

## 2018-04-09 DIAGNOSIS — I10 Essential (primary) hypertension: Secondary | ICD-10-CM

## 2018-04-09 DIAGNOSIS — I2510 Atherosclerotic heart disease of native coronary artery without angina pectoris: Secondary | ICD-10-CM

## 2018-04-09 DIAGNOSIS — I712 Thoracic aortic aneurysm, without rupture, unspecified: Secondary | ICD-10-CM

## 2018-04-09 DIAGNOSIS — I6523 Occlusion and stenosis of bilateral carotid arteries: Secondary | ICD-10-CM

## 2018-04-09 DIAGNOSIS — E785 Hyperlipidemia, unspecified: Secondary | ICD-10-CM | POA: Diagnosis not present

## 2018-04-09 DIAGNOSIS — I493 Ventricular premature depolarization: Secondary | ICD-10-CM

## 2018-04-09 NOTE — Patient Instructions (Signed)
Medication Instructions:  Your physician recommends that you continue on your current medications as directed. Please refer to the Current Medication list given to you today.  If you need a refill on your cardiac medications before your next appointment, please call your pharmacy.   Lab work: none If you have labs (blood work) drawn today and your tests are completely normal, you will receive your results only by: . MyChart Message (if you have MyChart) OR . A paper copy in the mail If you have any lab test that is abnormal or we need to change your treatment, we will call you to review the results.  Testing/Procedures: none  Follow-Up: At [facility], you and your health needs are our priority.  As part of our continuing mission to provide you with exceptional heart care, we have created designated Provider Care Teams.  These Care Teams include your primary Cardiologist (physician) and Advanced Practice Providers (APPs -  Physician Assistants and Nurse Practitioners) who all work together to provide you with the care you need, when you need it. You will need a follow up appointment in 6 months.  Please call our office 4 months in advance to schedule this appointment.  You may see Christopher McAlhany, MD or one of the following Advanced Practice Providers on your designated Care Team:   Brittainy Simmons, PA-C Dayna Dunn, PA-C . Michele Lenze, PA-C  Any Other Special Instructions Will Be Listed Below (If Applicable).    

## 2018-04-09 NOTE — Progress Notes (Signed)
Chief Complaint  Patient presents with  . Follow-up    CAD   History of Present Illness: 73 yo male with history of PVCs, CAD, HLD, HTN, thoracic aortic aneurysm, DM and carotid artery disease who is here today for cardiac follow up. He was admitted to [facility] November 2011 with an inferior STEMI. Emergent cardiac cath showed severe three vessel CAD. He underwent 3V CABG later that day with Dr. Cyndia Bent. (LIMA to LAD, SVG to OM, SVG to RCA). Event monitor 2014 with PVCs, NSR. Stress myoview without ischemia April 2014. Stress myoview February 2018 showed no ischemia. Echo October 2018 with normal LV function, LVEF=55-60%. No valve disease. Thoracic aortic aneurysm stable by CTA October 2019. Carotid dopplers with mild bilateral disease February 2018. He was seen in our office 12/23/17 by Melina Copa, PA-C with c/o decreased exercise tolerance. PVCs noted in cardiac rehab. He described recent persistent recurrent swelling of his ear initially treated as infection with rounds of doxycycline and possible MRA. 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 He has been on several rounds of prednisone. He has had more frequent awareness of PVCs when on prednisone. He was seen in ED 12/03/17 for atypical chest pain that resolved with morphine. Chest CTA 11/2017 showed no dissection, nonspecific enhancement of extrahepatic bile ducts, nonobstructing distal R ureter, bilateral nephrolithasis, left inguinal hernia with loop of descending colon without obstruction, saccular thoracic aneurysm within proximal descending thoracic aorta measuring 3.6x3.3cm (no change from prior). He returned to cardiac rehab following this episode and continued to have fatigue.  His metoprolol was stopped and he was started on acebutolol for PVC suppression. Nuclear stress test  01/08/18 with no ischemia. LVEF=71%. Echo 01/08/18 with LVEF=60-65%. Moderate LVH. PVCs noted during study. No improvement on acebutolol so he restarted his Lopressor.  He is now on prednisone and Cellcept.   He is here today for follow up. The patient denies any chest pain, dyspnea, palpitations, lower extremity edema, orthopnea, PND, dizziness, near syncope or syncope. He has fatigue. PVCs at cardiac rehab.   Primary Care Physician: Jani Gravel, MD  Past Medical History:  Diagnosis Date  . Anxiety   . Arthritis   . Asthma    exercise induced  . Back pain, chronic    gets injections in back  . CAD (coronary artery disease)    a.  prior inferior MI; s/p 3V CABG 01/01/10 (LIMA to LAD, SVG to OM, SVG to RCA- Dr.Bartle);  b. Last Myoview (05/2012):  Normal; EF 70% // c. Myoview 2/18: EF 60, no ischemia or infarction, Normal Study  . Carotid stenosis    Carotid US 03/2016 1-39% BICA  . Diabetes mellitus type 2 in nonobese (HCC)   . Frequent PVCs   . GERD (gastroesophageal reflux disease)   . Gout   . History of kidney stones    no problems with stones  . Hx of echocardiogram    Echo (01/2010):  EF 55-60%, ? Apical HK, mild BAE, atrial septal aneurysm.  Marland Kitchen Hyperlipidemia   . Hypertension   . Ischemic colitis ([place])    noted on CT back in August 2013 possible related to hypotension  . Pneumonia    x 2  . Premature atrial contractions   . PUD (peptic ulcer disease) 1974  . ST elevation myocardial infarction (STEMI) of inferior wall ([place]) 2011   s/p  CABG 2011  . Thoracic aortic aneurysm ([place])    a. last assessed 11/2017 3.6x3.3cm.  . 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 CARDIAC CATHETERIZATION    . CHEST TUBE INSERTION  1992   fx lt ribs-collapsed lung  . CORONARY ARTERY BYPASS GRAFT  11/11   X 3 VESSELS  . cyst removed from back of neck    . cyst removel on right buttocks - 2014    . EAR CYST  EXCISION Left 09/02/2012   Procedure: EXCISION SEBACEOUS CYST LEFT POSTERIOR LEG;  Surgeon: Pedro Earls, MD;  Location: [place];  Service: General;  Laterality: Left;  . Excision of deep left neck mass. SURGEON:  Leonides Sake. Lucia Gaskins, M.D.  Cramer.Melena  . EYE SURGERY  both cararacts  . HERNIA REPAIR    . INGUINAL HERNIA REPAIR Right 12/02/2012   Procedure: OPEN RIGHT INGUINAL HERINA;  Surgeon: Pedro Earls, MD;  Location: [facility];  Service: General;  Laterality: Right;  . INGUINAL HERNIA REPAIR Left 11/02/2014   Procedure: OPEN LEFT INGUINAL HERNIA REPAIR WITH MESH;  Surgeon: Johnathan Hausen, MD;  Location: [facility];  Service: General;  Laterality: Left;  . INSERTION OF MESH Right 12/02/2012   Procedure: INSERTION OF MESH;  Surgeon: Pedro Earls, MD;  [facility];  Service: General;  Laterality: Right;  . LUMBAR LAMINECTOMY/DECOMPRESSION MICRODISCECTOMY N/A 01/26/2015   Procedure: Lumbar 5-sacrum 1 decompression;  Surgeon: Phylliss Bob, MD;  Location: [place];  Service: Orthopedics;  Laterality: N/A;  . TONSILLECTOMY      Current Outpatient Medications  Medication Sig Dispense Refill  . allopurinol (ZYLOPRIM) 300 MG tablet Take 300 mg by mouth every morning.     . Ascorbic Acid (VITAMIN C) 1000 MG tablet Take 1,000 mg by mouth every morning.     Marland Kitchen aspirin 81 MG tablet Take 81 mg by mouth daily.    . Cholecalciferol (VITAMIN D3) 3000 UNITS TABS Take 3,000 Units by mouth daily.     . clotrimazole-betamethasone (LOTRISONE) cream Apply 1 application topically See admin instructions. Apply 2 times a day to both feet  1  . dexlansoprazole (DEXILANT) 60 MG capsule Take 60 mg by mouth every morning.     . hydrocortisone cream 1 % Apply 1 application topically as needed (for itching on ears or feet).     Marland Kitchen lisinopril (PRINIVIL,ZESTRIL) 40 MG tablet Take 1 tablet (40 mg total) by mouth daily. 90 tablet 3  . Magnesium Oxide 400 (240 Mg) MG TABS Take 2 tablets (800 mg total) by mouth  2 (two) times daily. 30 tablet 0  . metFORMIN (GLUCOPHAGE) 1000 MG tablet Take 1,000 mg by mouth 2 (two) times daily with a meal.  6  . methocarbamol (ROBAXIN) 500 MG tablet Take 500 mg by mouth 4 (four) times daily as needed for muscle spasms.     . metoprolol tartrate (LOPRESSOR) 50 MG tablet Take 1.5 tablets (75 mg total) by mouth 2 (two) times daily. 90 tablet 3  . Multiple Vitamin (MULTIVITAMIN WITH MINERALS) TABS tablet Take 1 tablet by mouth every morning.     . mupirocin ointment (BACTROBAN) 2 % Apply 1 application topically See admin instructions. Apply to both nostrils 2 times a day for the first 5 days of each month for 6 months  6  . mycophenolate (CELLCEPT) 500 MG tablet Take 1 tablet by mouth 2 (two) times daily.    Marland Kitchen  neomycin-bacitracin-polymyxin (NEOSPORIN) [phone] ointment Apply 1 application topically 2 (two) times daily as needed (FOR FOOT IRRITATION).     Marland Kitchen nitroGLYCERIN (NITROSTAT) 0.4 MG SL tablet Place 1 tablet (0.4 mg total) under the tongue every 5 (five) minutes as needed for chest pain. 25 tablet 3  . predniSONE (DELTASONE) 10 MG tablet Take 10-20 mg by mouth See admin instructions. Take 20 mg by mouth once a day for 14 days then 15 mg once a day for 14 days then 10 mg once a day for 14 days  0  . silver sulfADIAZINE (SILVADENE) 1 % cream Apply 1 application topically as needed (to affected areas of ears as directed).     . simvastatin (ZOCOR) 40 MG tablet Take 40 mg by mouth daily.     . sitaGLIPtin (JANUVIA) 100 MG tablet Take 100 mg by mouth daily.    . ursodiol (ACTIGALL) 300 MG capsule Take 300 mg by mouth 3 (three) times daily.  12  . vitamin B-12 (CYANOCOBALAMIN) 500 MCG tablet Take 500 mcg by mouth daily.    . vitamin E 400 UNIT capsule Take 400 Units by mouth daily.     No current facility-administered medications for this visit.     Allergies  Allergen Reactions  . Clindamycin/Lincomycin Itching and Rash    All-over itching; required a course of  Prednisone to treat  . Levofloxacin Nausea Only and Other (See Comments)    Insomnia, also    Social History   Socioeconomic History  . Marital status: Widowed    Spouse name: Not on file  . Number of children: Not on file  . Years of education: Not on file  . Highest education level: Not on file  Occupational History  . Not on file  Social Needs  . Financial resource strain: Not on file  . Food insecurity:    Worry: Not on file    Inability: Not on file  . Transportation needs:    Medical: Not on file    Non-medical: Not on file  Tobacco Use  . Smoking status: Former Smoker    Last attempt to quit: 02/25/1974    Years since quitting: 44.1  . Smokeless tobacco: Never Used  Substance and Sexual Activity  . Alcohol use: Yes    Comment: rarely  . Drug use: No  . Sexual activity: Not on file  Lifestyle  . Physical activity:    Days per week: Not on file    Minutes per session: Not on file  . Stress: Not on file  Relationships  . Social connections:    Talks on phone: Not on file    Gets together: Not on file    Attends religious service: Not on file    Active member of club or organization: Not on file    Attends meetings of clubs or organizations: Not on file    Relationship status: Not on file  . Intimate partner violence:    Fear of current or ex partner: Not on file    Emotionally abused: Not on file    Physically abused: Not on file    Forced sexual activity: Not on file  Other Topics Concern  . Not on file  Social History Narrative  . Not on file    Family History  Problem Relation Age of Onset  . Heart failure Mother   . Pneumonia Father   . Lung cancer Sister   . Esophageal cancer Sister   . Pancreatic cancer Maternal  Grandmother     Review of Systems:  As stated in the HPI and otherwise negative.   BP 116/72   Pulse 85   Ht 5\' 9"  (1.753 m)   Wt 160 lb 12.8 oz (72.9 kg)   SpO2 99%   BMI 23.75 kg/m   Physical Examination: General: Well  developed, well nourished, NAD  HEENT: OP clear, mucus membranes moist  SKIN: warm, dry. No rashes. Neuro: No focal deficits  Musculoskeletal: Muscle strength 5/5 all ext  Psychiatric: Mood and affect normal  Neck: No JVD, no carotid bruits, no thyromegaly, no lymphadenopathy.  Lungs:Clear bilaterally, no wheezes, rhonci, crackles Cardiovascular: Regular rate and rhythm. No murmurs, gallops or rubs. Abdomen:Soft. Bowel sounds present. Non-tender.  Extremities: No lower extremity edema. Pulses are 2 + in the bilateral DP/PT.  Echo 01/08/18: Left ventricle: The cavity size was normal. Wall thickness was   increased in a pattern of moderate LVH. Systolic function was   normal. The estimated ejection fraction was in the range of 60%   to 65%. Wall motion was normal; there were no regional wall   motion abnormalities. The E/e&' ratio is between 8-15, suggesting   indeterminate LV filling pressure. - Left atrium: The atrium was normal in size. - Inferior vena cava: The vessel was normal in size. The   respirophasic diameter changes were in the normal range (= 50%),   consistent with normal central venous pressure.  Impressions:  - Compared to a prior study in 11/2016, the LVEF is higher at   60-65%. Frequent PVC&'s were noted.  EKG:  EKG is not  ordered today. The ekg ordered today demonstrates   Recent Labs: 07/17/2017: B Natriuretic Peptide 56.8 08/11/2017: ALT 52 12/23/2017: Hemoglobin 14.6; Platelets 244; TSH 0.357 12/31/2017: BUN 33; Creatinine, Ser 0.85; Potassium 4.8; Sodium 133 01/20/2018: Magnesium 1.1   Lipid Panel No results found for: CHOL, TRIG, HDL, CHOLHDL, VLDL, LDLCALC, LDLDIRECT   Wt Readings from Last 3 Encounters:  04/09/18 160 lb 12.8 oz (72.9 kg)  01/15/18 174 lb 12.8 oz (79.3 kg)  01/08/18 179 lb (81.2 kg)     Other studies Reviewed: Additional studies/ records that were reviewed today include: . Review of the above records demonstrates:     Assessment and Plan:   1. CAD without angina: Nuclear stress test 01/08/18 with no ischemia. LV function normal by echo 01/08/18. He has no chest pain. Will continue ASA, statin and beta blocker.      2. HTN: BP is well controlled.   3. HLD: Lipids followed in primary care. Continue statin  4. Thoracic aortic aneurysm: CTA October 2019 with stable aneurysm in the proximal descending thoracic aorta.   5. PVCs: Rare palpitations but he says he has PVCs while exercising at rehab. Continue Lopressor.   6. Carotid artery disease: Mild disease by dopplers March 2019.   Current medicines are reviewed at length with the patient today.  The patient does not have concerns regarding medicines.  The following changes have been made:  no change  Labs/ tests ordered today include:   No orders of the defined types were placed in this encounter.   Disposition:   FU with me in 6 months   Signed, Lauree Chandler, MD 04/09/2018 10:38 AM    [facility] [address], [place], [place]  [address] Phone: [phone]; Fax: [phone]

## 2018-04-10 ENCOUNTER — Encounter (HOSPITAL_COMMUNITY)
Admission: RE | Admit: 2018-04-10 | Discharge: 2018-04-10 | Disposition: A | Discharge status: Home / Self Care | Payer: Self-pay | Source: Ambulatory Visit | Attending: Cardiovascular Disease | Admitting: Cardiovascular Disease

## 2018-04-13 ENCOUNTER — Encounter (HOSPITAL_COMMUNITY)
Admission: RE | Admit: 2018-04-13 | Discharge: 2018-04-13 | Disposition: A | Discharge status: Home / Self Care | Payer: Self-pay | Source: Ambulatory Visit | Attending: Cardiovascular Disease | Admitting: Cardiovascular Disease

## 2018-04-15 ENCOUNTER — Ambulatory Visit: Payer: Medicare Other | Admitting: Physician Assistant

## 2018-04-15 ENCOUNTER — Encounter (HOSPITAL_COMMUNITY): Payer: Self-pay

## 2018-04-17 ENCOUNTER — Encounter (HOSPITAL_COMMUNITY): Payer: Self-pay | Admitting: Certified Registered Nurse Anesthetist

## 2018-04-17 ENCOUNTER — Encounter (HOSPITAL_COMMUNITY)
Admission: RE | Disposition: A | Discharge status: Home / Self Care | Payer: Self-pay | Source: Home / Self Care | Attending: Gastroenterology

## 2018-04-17 ENCOUNTER — Encounter (HOSPITAL_COMMUNITY): Payer: Self-pay

## 2018-04-17 ENCOUNTER — Ambulatory Visit (HOSPITAL_COMMUNITY)
Admission: RE | Admit: 2018-04-17 | Discharge: 2018-04-17 | Disposition: A | Discharge status: Home / Self Care | Payer: Medicare Other | Attending: Gastroenterology | Admitting: Gastroenterology

## 2018-04-17 DIAGNOSIS — K828 Other specified diseases of gallbladder: Secondary | ICD-10-CM | POA: Insufficient documentation

## 2018-04-17 DIAGNOSIS — K8689 Other specified diseases of pancreas: Secondary | ICD-10-CM | POA: Diagnosis not present

## 2018-04-17 DIAGNOSIS — Z539 Procedure and treatment not carried out, unspecified reason: Secondary | ICD-10-CM | POA: Insufficient documentation

## 2018-04-17 LAB — CBC
HCT: 35.7 % — ABNORMAL LOW (ref 39.0–52.0)
Hemoglobin: 11.3 g/dL — ABNORMAL LOW (ref 13.0–17.0)
MCH: 33.1 pg (ref 26.0–34.0)
MCHC: 31.7 g/dL (ref 30.0–36.0)
MCV: 104.7 fL — AB (ref 80.0–100.0)
Platelets: 229 10*3/uL (ref 150–400)
RBC: 3.41 MIL/uL — ABNORMAL LOW (ref 4.22–5.81)
RDW: 13.2 % (ref 11.5–15.5)
WBC: 11.8 10*3/uL — ABNORMAL HIGH (ref 4.0–10.5)
nRBC: 0 % (ref 0.0–0.2)

## 2018-04-17 LAB — COMPREHENSIVE METABOLIC PANEL
ALT: 84 U/L — AB (ref 0–44)
AST: 68 U/L — ABNORMAL HIGH (ref 15–41)
Albumin: 3.1 g/dL — ABNORMAL LOW (ref 3.5–5.0)
Alkaline Phosphatase: 431 U/L — ABNORMAL HIGH (ref 38–126)
Anion gap: 10 (ref 5–15)
BUN: 40 mg/dL — ABNORMAL HIGH (ref 8–23)
CO2: 25 mmol/L (ref 22–32)
CREATININE: 0.85 mg/dL (ref 0.61–1.24)
Calcium: 9.8 mg/dL (ref 8.9–10.3)
Chloride: 97 mmol/L — ABNORMAL LOW (ref 98–111)
GFR calc non Af Amer: >60 mL/min (ref >60)
Glucose, Bld: 125 mg/dL — ABNORMAL HIGH (ref 70–99)
Potassium: 4 mmol/L (ref 3.5–5.1)
Sodium: 132 mmol/L — ABNORMAL LOW (ref 135–145)
Total Bilirubin: 0.7 mg/dL (ref 0.3–1.2)
Total Protein: 6.4 g/dL — ABNORMAL LOW (ref 6.5–8.1)

## 2018-04-17 SURGERY — CANCELLED PROCEDURE

## 2018-04-17 MED ORDER — MIDAZOLAM HCL 2 MG/2ML IJ SOLN
INTRAMUSCULAR | Status: AC
  Filled 2018-04-17: qty 2

## 2018-04-17 MED ORDER — PROPOFOL 10 MG/ML IV BOLUS
INTRAVENOUS | Status: AC
  Filled 2018-04-17: qty 40

## 2018-04-17 MED ORDER — SODIUM CHLORIDE 0.9 % IV SOLN: INTRAVENOUS | Status: DC

## 2018-04-17 MED ORDER — LACTATED RINGERS IV SOLN: INTRAVENOUS | Status: DC

## 2018-04-17 MED ORDER — FENTANYL CITRATE (PF) 100 MCG/2ML IJ SOLN
INTRAMUSCULAR | Status: AC
  Filled 2018-04-17: qty 2

## 2018-04-18 LAB — CANCER ANTIGEN 19-9: CA 19-9: 2220 U/mL — ABNORMAL HIGH (ref 0–35)

## 2018-04-20 ENCOUNTER — Encounter (HOSPITAL_COMMUNITY): Payer: Self-pay

## 2018-04-20 NOTE — Progress Notes (Signed)
Patient cancelled on Friday for ERCP per Dr. Benson Norway. Labs drawn per order.

## 2018-04-21 ENCOUNTER — Ambulatory Visit (HOSPITAL_COMMUNITY)
Admission: RE | Admit: 2018-04-21 | Discharge: 2018-04-21 | Disposition: A | Discharge status: Home / Self Care | Payer: Medicare Other | Attending: Gastroenterology | Admitting: Gastroenterology

## 2018-04-21 ENCOUNTER — Other Ambulatory Visit: Payer: Self-pay

## 2018-04-21 ENCOUNTER — Ambulatory Visit (HOSPITAL_COMMUNITY): Payer: Medicare Other | Admitting: Anesthesiology

## 2018-04-21 ENCOUNTER — Ambulatory Visit (HOSPITAL_COMMUNITY): Admit: 2018-04-21 | Payer: Medicare Other | Admitting: Gastroenterology

## 2018-04-21 ENCOUNTER — Encounter (HOSPITAL_COMMUNITY): Payer: Self-pay

## 2018-04-21 ENCOUNTER — Encounter (HOSPITAL_COMMUNITY)
Admission: RE | Disposition: A | Discharge status: Home / Self Care | Payer: Self-pay | Source: Home / Self Care | Attending: Gastroenterology

## 2018-04-21 ENCOUNTER — Ambulatory Visit (HOSPITAL_COMMUNITY): Payer: Medicare Other

## 2018-04-21 DIAGNOSIS — Z8711 Personal history of peptic ulcer disease: Secondary | ICD-10-CM | POA: Diagnosis not present

## 2018-04-21 DIAGNOSIS — I712 Thoracic aortic aneurysm, without rupture: Secondary | ICD-10-CM | POA: Diagnosis not present

## 2018-04-21 DIAGNOSIS — J45909 Unspecified asthma, uncomplicated: Secondary | ICD-10-CM | POA: Diagnosis not present

## 2018-04-21 DIAGNOSIS — Z79899 Other long term (current) drug therapy: Secondary | ICD-10-CM | POA: Diagnosis not present

## 2018-04-21 DIAGNOSIS — Z881 Allergy status to other antibiotic agents status: Secondary | ICD-10-CM | POA: Diagnosis not present

## 2018-04-21 DIAGNOSIS — Z87891 Personal history of nicotine dependence: Secondary | ICD-10-CM | POA: Insufficient documentation

## 2018-04-21 DIAGNOSIS — G8929 Other chronic pain: Secondary | ICD-10-CM | POA: Diagnosis not present

## 2018-04-21 DIAGNOSIS — Z7982 Long term (current) use of aspirin: Secondary | ICD-10-CM | POA: Diagnosis not present

## 2018-04-21 DIAGNOSIS — M109 Gout, unspecified: Secondary | ICD-10-CM | POA: Insufficient documentation

## 2018-04-21 DIAGNOSIS — M1990 Unspecified osteoarthritis, unspecified site: Secondary | ICD-10-CM | POA: Insufficient documentation

## 2018-04-21 DIAGNOSIS — K831 Obstruction of bile duct: Secondary | ICD-10-CM | POA: Insufficient documentation

## 2018-04-21 DIAGNOSIS — Z951 Presence of aortocoronary bypass graft: Secondary | ICD-10-CM | POA: Diagnosis not present

## 2018-04-21 DIAGNOSIS — K8301 Primary sclerosing cholangitis: Secondary | ICD-10-CM | POA: Insufficient documentation

## 2018-04-21 DIAGNOSIS — I491 Atrial premature depolarization: Secondary | ICD-10-CM | POA: Insufficient documentation

## 2018-04-21 DIAGNOSIS — I2510 Atherosclerotic heart disease of native coronary artery without angina pectoris: Secondary | ICD-10-CM | POA: Insufficient documentation

## 2018-04-21 DIAGNOSIS — E119 Type 2 diabetes mellitus without complications: Secondary | ICD-10-CM | POA: Diagnosis not present

## 2018-04-21 DIAGNOSIS — E785 Hyperlipidemia, unspecified: Secondary | ICD-10-CM | POA: Diagnosis not present

## 2018-04-21 DIAGNOSIS — I252 Old myocardial infarction: Secondary | ICD-10-CM | POA: Insufficient documentation

## 2018-04-21 DIAGNOSIS — Z7984 Long term (current) use of oral hypoglycemic drugs: Secondary | ICD-10-CM | POA: Diagnosis not present

## 2018-04-21 DIAGNOSIS — Z888 Allergy status to other drugs, medicaments and biological substances status: Secondary | ICD-10-CM | POA: Insufficient documentation

## 2018-04-21 DIAGNOSIS — Z87442 Personal history of urinary calculi: Secondary | ICD-10-CM | POA: Diagnosis not present

## 2018-04-21 DIAGNOSIS — I10 Essential (primary) hypertension: Secondary | ICD-10-CM | POA: Insufficient documentation

## 2018-04-21 HISTORY — PX: ENDOSCOPIC RETROGRADE CHOLANGIOPANCREATOGRAPHY (ERCP) WITH PROPOFOL: SHX5810

## 2018-04-21 HISTORY — PX: BILIARY STENT PLACEMENT: SHX5538

## 2018-04-21 HISTORY — PX: SPHINCTEROTOMY: SHX5279

## 2018-04-21 SURGERY — ERCP, WITH INTERVENTION IF INDICATED
Anesthesia: General

## 2018-04-21 SURGERY — ENDOSCOPIC RETROGRADE CHOLANGIOPANCREATOGRAPHY (ERCP) WITH PROPOFOL
Anesthesia: General

## 2018-04-21 MED ORDER — LIDOCAINE 2% (20 MG/ML) 5 ML SYRINGE
INTRAMUSCULAR | Status: DC | PRN
  Administered 2018-04-21: 80 mg via INTRAVENOUS

## 2018-04-21 MED ORDER — ONDANSETRON HCL 4 MG/2ML IJ SOLN
INTRAMUSCULAR | Status: DC | PRN
  Administered 2018-04-21: 4 mg via INTRAVENOUS

## 2018-04-21 MED ORDER — PROPOFOL 10 MG/ML IV BOLUS
INTRAVENOUS | Status: AC
  Filled 2018-04-21: qty 20

## 2018-04-21 MED ORDER — INDOMETHACIN 50 MG RE SUPP
RECTAL | Status: AC
  Filled 2018-04-21: qty 2

## 2018-04-21 MED ORDER — LACTATED RINGERS IV SOLN
INTRAVENOUS | Status: DC
  Administered 2018-04-21: 1000 mL via INTRAVENOUS

## 2018-04-21 MED ORDER — FENTANYL CITRATE (PF) 250 MCG/5ML IJ SOLN
INTRAMUSCULAR | Status: DC | PRN
  Administered 2018-04-21: 100 ug via INTRAVENOUS

## 2018-04-21 MED ORDER — SUGAMMADEX SODIUM 200 MG/2ML IV SOLN
INTRAVENOUS | Status: DC | PRN
  Administered 2018-04-21: 200 mg via INTRAVENOUS

## 2018-04-21 MED ORDER — FENTANYL CITRATE (PF) 100 MCG/2ML IJ SOLN
INTRAMUSCULAR | Status: AC
  Filled 2018-04-21: qty 2

## 2018-04-21 MED ORDER — INDOMETHACIN 50 MG RE SUPP
RECTAL | Status: DC | PRN
  Administered 2018-04-21: 100 mg via RECTAL

## 2018-04-21 MED ORDER — IOPAMIDOL (ISOVUE-M 300) INJECTION 61%
INTRAMUSCULAR | Status: DC | PRN
  Administered 2018-04-21: 18 mL via INTRATHECAL

## 2018-04-21 MED ORDER — ROCURONIUM BROMIDE 10 MG/ML (PF) SYRINGE
PREFILLED_SYRINGE | INTRAVENOUS | Status: DC | PRN
  Administered 2018-04-21: 40 mg via INTRAVENOUS

## 2018-04-21 MED ORDER — PROPOFOL 10 MG/ML IV BOLUS
INTRAVENOUS | Status: DC | PRN
  Administered 2018-04-21: 150 mg via INTRAVENOUS

## 2018-04-21 MED ORDER — GLUCAGON HCL RDNA (DIAGNOSTIC) 1 MG IJ SOLR
INTRAMUSCULAR | Status: AC
  Filled 2018-04-21: qty 1

## 2018-04-21 MED ORDER — SODIUM CHLORIDE 0.9 % IV SOLN: INTRAVENOUS | Status: DC

## 2018-04-21 MED ORDER — SODIUM CHLORIDE 0.9 % IV SOLN
1.5000 g | Freq: Once | INTRAVENOUS | Status: AC
  Administered 2018-04-21: 1.5 g via INTRAVENOUS
  Filled 2018-04-21: qty 1.5

## 2018-04-21 MED ORDER — DEXAMETHASONE SODIUM PHOSPHATE 10 MG/ML IJ SOLN
INTRAMUSCULAR | Status: DC | PRN
  Administered 2018-04-21: 10 mg via INTRAVENOUS

## 2018-04-21 NOTE — Transfer of Care (Signed)
Immediate Anesthesia Transfer of Care Note  Patient: Samuel Coleman  Procedure(s) Performed: ENDOSCOPIC RETROGRADE CHOLANGIOPANCREATOGRAPHY (ERCP) WITH PROPOFOL (N/A ) BILIARY BRUSHING SPHINCTEROTOMY BILIARY STENT PLACEMENT (N/A )  Patient Location: Endoscopy Unit  Anesthesia Type:General  Level of Consciousness: awake, alert , oriented and patient cooperative  Airway & Oxygen Therapy: Patient Spontanous Breathing and Patient connected to face mask oxygen  Post-op Assessment: Report given to RN, Post -op Vital signs reviewed and stable and Patient moving all extremities  Post vital signs: Reviewed and stable  Last Vitals:  Vitals Value Taken Time  BP 157/71 04/21/2018  4:22 PM  Temp    Pulse 86 04/21/2018  4:25 PM  Resp 12 04/21/2018  4:25 PM  SpO2 100 % 04/21/2018  4:25 PM  Vitals shown include unvalidated device data.  Last Pain:  Vitals:   04/21/18 1402  TempSrc: Oral  PainSc: 3          Complications: No apparent anesthesia complications

## 2018-04-21 NOTE — H&P (Signed)
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  His Ca19-9 was elevated in the 2000 range.  A dominant stricture was found and there is concern for cholangiocarcinoma.  Past Medical History:  Diagnosis Date  . Anxiety   . Arthritis   . Asthma    exercise induced  . Back pain, chronic    gets injections in back  . CAD (coronary artery disease)    a.  prior inferior MI; s/p 3V CABG 01/01/10 (LIMA to LAD, SVG to OM, SVG to RCA- Dr.Bartle);  b. Last Myoview (05/2012):  Normal; EF 70% // c. Myoview 2/18: EF 60, no ischemia or infarction, Normal Study  . Carotid stenosis    Carotid US 03/2016 1-39% BICA  . Diabetes mellitus type 2 in nonobese (HCC)   . Frequent PVCs   . GERD (gastroesophageal reflux disease)   . Gout   . History of kidney stones    no problems with stones  . Hx of echocardiogram    Echo (01/2010):  EF 55-60%, ? Apical HK, mild BAE, atrial septal aneurysm.  Marland Kitchen Hyperlipidemia   . Hypertension   . Ischemic colitis ([place])    noted on CT back in August 2013 possible related to hypotension  . Pneumonia    x 2  . Premature atrial contractions   . PUD (peptic ulcer disease) 1974  . ST elevation myocardial infarction (STEMI) of inferior wall ([place]) 2011   s/p CABG 2011  . Thoracic aortic aneurysm ([place])    a. last assessed 11/2017 3.6x3.3cm.  . 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 CARDIAC CATHETERIZATION    . CHEST TUBE INSERTION  1992   fx lt ribs-collapsed lung  . CORONARY ARTERY BYPASS GRAFT  11/11   X 3 VESSELS  . cyst removed from back of neck    . cyst removel on right buttocks - 2014    . EAR CYST EXCISION Left 09/02/2012   Procedure: EXCISION SEBACEOUS CYST LEFT POSTERIOR LEG;  Surgeon: Pedro Earls, MD;  Location: [place];  Service: General;  Laterality: Left;  . Excision of deep left neck mass. SURGEON:  Leonides Sake. Lucia Gaskins, M.Coleman.  Cramer.Melena  . EYE SURGERY  both cararacts  . HERNIA REPAIR    . INGUINAL HERNIA REPAIR Right 12/02/2012   Procedure: OPEN RIGHT INGUINAL HERINA;  Surgeon: Pedro Earls, MD;  [facility];  Service: General;  Laterality: Right;  . INGUINAL HERNIA REPAIR Left 11/02/2014   Procedure: OPEN LEFT INGUINAL HERNIA REPAIR WITH MESH;  Surgeon: Johnathan Hausen, MD;  [facility];  Service: General;  Laterality: Left;  . INSERTION OF MESH Right 12/02/2012   Procedure: INSERTION OF MESH;  Surgeon: Pedro Earls, MD;  [facility];  Service: General;  Laterality: Right;  . LUMBAR LAMINECTOMY/DECOMPRESSION MICRODISCECTOMY N/A 01/26/2015   Procedure: Lumbar 5-sacrum 1 decompression;  Surgeon: Phylliss Bob, MD;  Location: [facility];  Service: Orthopedics;  Laterality: N/A;  . TONSILLECTOMY      Family History  Problem Relation Age of Onset  . Heart failure Mother   . Pneumonia Father   . Lung cancer Sister   . Esophageal cancer Sister   . Pancreatic cancer Maternal Grandmother     Social History:  reports that he quit smoking about 44 years ago. He has never used smokeless tobacco. He reports current alcohol use.  He reports that he does not use drugs.  Allergies:  Allergies  Allergen Reactions  . Clindamycin/Lincomycin Itching and Rash    All-over itching; required a course of Prednisone to treat  . Levofloxacin Nausea Only and Other (See Comments)    Insomnia    Medications:  Scheduled:  Continuous: . lactated ringers      No results found. However, due to the size of the patient record, not all encounters were searched. Please check Results Review for a complete set of results.   No results found.  ROS:  As stated above in the HPI otherwise negative.  Blood pressure (!) 144/66, pulse 79, temperature 97.9 F (36.6 C), temperature source Oral, resp. rate 12, weight 70.3 kg, SpO2 100 %.    PE: Gen: NAD, Alert and Oriented HEENT:  [place]/AT, EOMI Neck: Supple, no  LAD Lungs: CTA Bilaterally CV: RRR without M/G/R ABM: Soft, NTND, +BS Ext: No C/C/E  Assessment/Plan: 1) Sclerosing cholangitis with a dominant stricture - ERCP with stent placement and brushings.  Samuel Coleman 04/21/2018, 2:04 PM

## 2018-04-21 NOTE — Anesthesia Postprocedure Evaluation (Signed)
Anesthesia Post Note  Patient: Samuel Coleman  Procedure(s) Performed: ENDOSCOPIC RETROGRADE CHOLANGIOPANCREATOGRAPHY (ERCP) WITH PROPOFOL (N/A ) BILIARY BRUSHING SPHINCTEROTOMY BILIARY STENT PLACEMENT (N/A )     Patient location during evaluation: PACU Anesthesia Type: General Level of consciousness: sedated and patient cooperative Pain management: pain level controlled Vital Signs Assessment: post-procedure vital signs reviewed and stable Respiratory status: spontaneous breathing Cardiovascular status: stable Anesthetic complications: no    Last Vitals:  Vitals:   04/21/18 1630 04/21/18 1647  BP: (!) 145/74 (!) 151/75  Pulse: 84 79  Resp: (!) 24 (!) 21  Temp:    SpO2: 100% 99%    Last Pain:  Vitals:   04/21/18 1647  TempSrc:   PainSc: 0-No pain                 Nolon Nations

## 2018-04-21 NOTE — Anesthesia Procedure Notes (Signed)
Procedure Name: Intubation Date/Time: 04/21/2018 3:29 PM Performed by: Mitzie Na, CRNA Pre-anesthesia Checklist: Patient identified, Emergency Drugs available, Suction available, Patient being monitored and Timeout performed Patient Re-evaluated:Patient Re-evaluated prior to induction Oxygen Delivery Method: Circle system utilized Preoxygenation: Pre-oxygenation with 100% oxygen Induction Type: IV induction Ventilation: Mask ventilation without difficulty Laryngoscope Size: Mac and 3 Grade View: Grade I Tube type: Oral Tube size: 7.0 mm Number of attempts: 1 Airway Equipment and Method: Stylet Placement Confirmation: ETT inserted through vocal cords under direct vision,  positive ETCO2 and breath sounds checked- equal and bilateral Secured at: 24 cm Tube secured with: Tape Dental Injury: Teeth and Oropharynx as per pre-operative assessment

## 2018-04-21 NOTE — H&P (View-Only) (Signed)
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  His Ca19-9 was elevated in the 2000 range.  A dominant stricture was found and there is concern for cholangiocarcinoma.  Past Medical History:  Diagnosis Date  . Anxiety   . Arthritis   . Asthma    exercise induced  . Back pain, chronic    gets injections in back  . CAD (coronary artery disease)    a.  prior inferior MI; s/p 3V CABG 01/01/10 (LIMA to LAD, SVG to OM, SVG to RCA- Dr.Bartle);  b. Last Myoview (05/2012):  Normal; EF 70% // c. Myoview 2/18: EF 60, no ischemia or infarction, Normal Study  . Carotid stenosis    Carotid US 03/2016 1-39% BICA  . Diabetes mellitus type 2 in nonobese (HCC)   . Frequent PVCs   . GERD (gastroesophageal reflux disease)   . Gout   . History of kidney stones    no problems with stones  . Hx of echocardiogram    Echo (01/2010):  EF 55-60%, ? Apical HK, mild BAE, atrial septal aneurysm.  Marland Kitchen Hyperlipidemia   . Hypertension   . Ischemic colitis ([place])    noted on CT back in August 2013 possible related to hypotension  . Pneumonia    x 2  . Premature atrial contractions   . PUD (peptic ulcer disease) 1974  . ST elevation myocardial infarction (STEMI) of inferior wall ([place]) 2011   s/p CABG 2011  . Thoracic aortic aneurysm ([place])    a. last assessed 11/2017 3.6x3.3cm.  . 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 CARDIAC CATHETERIZATION    . CHEST TUBE INSERTION  1992   fx lt ribs-collapsed lung  . CORONARY ARTERY BYPASS GRAFT  11/11   X 3 VESSELS  . cyst removed from back of neck    . cyst removel on right buttocks - 2014    . EAR CYST EXCISION Left 09/02/2012   Procedure: EXCISION SEBACEOUS CYST LEFT POSTERIOR LEG;  Surgeon: Pedro Earls, MD;  Location: [place];  Service: General;  Laterality: Left;  . Excision of deep left neck mass. SURGEON:  Leonides Sake. Lucia Gaskins, M.D.  Cramer.Melena  . EYE SURGERY  both cararacts  . HERNIA REPAIR    . INGUINAL HERNIA REPAIR Right 12/02/2012   Procedure: OPEN RIGHT INGUINAL HERINA;  Surgeon: Pedro Earls, MD;  Location: [facility];  Service: General;  Laterality: Right;  . INGUINAL HERNIA REPAIR Left 11/02/2014   Procedure: OPEN LEFT INGUINAL HERNIA REPAIR WITH MESH;  Surgeon: Johnathan Hausen, MD;  Location: [facility];  Service: General;  Laterality: Left;  . INSERTION OF MESH Right 12/02/2012   Procedure: INSERTION OF MESH;  Surgeon: Pedro Earls, MD;  Location: [facility];  Service: General;  Laterality: Right;  . LUMBAR LAMINECTOMY/DECOMPRESSION MICRODISCECTOMY N/A 01/26/2015   Procedure: Lumbar 5-sacrum 1 decompression;  Surgeon: Phylliss Bob, MD;  Location: [place];  Service: Orthopedics;  Laterality: N/A;  . TONSILLECTOMY      Family History  Problem Relation Age of Onset  . Heart failure Mother   . Pneumonia Father   . Lung cancer Sister   . Esophageal cancer Sister   . Pancreatic cancer Maternal Grandmother     Social History:  reports that he quit smoking about 44 years ago. He has never used smokeless tobacco. He reports current alcohol use.  He reports that he does not use drugs.  Allergies:  Allergies  Allergen Reactions  . Clindamycin/Lincomycin Itching and Rash    All-over itching; required a course of Prednisone to treat  . Levofloxacin Nausea Only and Other (See Comments)    Insomnia    Medications:  Scheduled:  Continuous: . lactated ringers      No results found. However, due to the size of the patient record, not all encounters were searched. Please check Results Review for a complete set of results.   No results found.  ROS:  As stated above in the HPI otherwise negative.  Blood pressure (!) 144/66, pulse 79, temperature 97.9 F (36.6 C), temperature source Oral, resp. rate 12, weight 70.3 kg, SpO2 100 %.    PE: Gen: NAD, Alert and Oriented HEENT:  [place]/AT, EOMI Neck: Supple, no  LAD Lungs: CTA Bilaterally CV: RRR without M/G/R ABM: Soft, NTND, +BS Ext: No C/C/E  Assessment/Plan: 1) Sclerosing cholangitis with a dominant stricture - ERCP with stent placement and brushings.  Samuel Coleman D 04/21/2018, 2:04 PM

## 2018-04-21 NOTE — Anesthesia Preprocedure Evaluation (Addendum)
Anesthesia Evaluation  Patient identified by MRN, date of birth, ID band Patient awake    Reviewed: Allergy & Precautions, NPO status , Patient's Chart, lab work & pertinent test results, reviewed documented beta blocker date and time   Airway Mallampati: II  TM Distance: >3 FB Neck ROM: Full    Dental  (+) Dental Advisory Given, Teeth Intact   Pulmonary asthma , pneumonia, former smoker,    Pulmonary exam normal breath sounds clear to auscultation       Cardiovascular hypertension, Pt. on home beta blockers and Pt. on medications + CAD, + Past MI and + CABG  Normal cardiovascular exam Rhythm:Regular Rate:Normal     Neuro/Psych PSYCHIATRIC DISORDERS Anxiety negative neurological ROS     GI/Hepatic Neg liver ROS, PUD, GERD  ,  Endo/Other  diabetes, Type 2  Renal/GU negative Renal ROS     Musculoskeletal  (+) Arthritis ,   Abdominal   Peds  Hematology negative hematology ROS (+)   Anesthesia Other Findings   Reproductive/Obstetrics negative OB ROS                                                             Anesthesia Evaluation  Patient identified by MRN, date of birth, ID band Patient awake    Reviewed: Allergy & Precautions, NPO status , Patient's Chart, lab work & pertinent test results  Airway Mallampati: II  TM Distance: >3 FB Neck ROM: Full    Dental no notable dental hx.    Pulmonary neg pulmonary ROS, former smoker,    Pulmonary exam normal breath sounds clear to auscultation       Cardiovascular hypertension, Pt. on home beta blockers + CAD, + Past MI, + CABG and + Peripheral Vascular Disease  Normal cardiovascular exam Rhythm:Regular Rate:Normal  Myoview (05/2012): Normal; EF 70%   Neuro/Psych negative neurological ROS  negative psych ROS   GI/Hepatic Neg liver ROS, PUD,   Endo/Other  negative endocrine ROS  Renal/GU negative Renal ROS  negative  genitourinary   Musculoskeletal negative musculoskeletal ROS (+)   Abdominal   Peds negative pediatric ROS (+)  Hematology negative hematology ROS (+)   Anesthesia Other Findings   Reproductive/Obstetrics negative OB ROS                             Anesthesia Physical Anesthesia Plan  ASA: III  Anesthesia Plan: General   Post-op Pain Management:    Induction: Intravenous  Airway Management Planned: Oral ETT  Additional Equipment:   Intra-op Plan:   Post-operative Plan: Extubation in OR  Informed Consent: I have reviewed the patients History and Physical, chart, labs and discussed the procedure including the risks, benefits and alternatives for the proposed anesthesia with the patient or authorized representative who has indicated his/her understanding and acceptance.   Dental advisory given  Plan Discussed with: CRNA and Surgeon  Anesthesia Plan Comments:         Anesthesia Quick Evaluation  Anesthesia Physical Anesthesia Plan  ASA: III  Anesthesia Plan: General   Post-op Pain Management:    Induction: Intravenous  PONV Risk Score and Plan: 2 and Ondansetron, Dexamethasone and Treatment may vary due to age or medical condition  Airway Management Planned: Oral ETT  Additional Equipment: None  Intra-op Plan:   Post-operative Plan: Extubation in OR  Informed Consent: I have reviewed the patients History and Physical, chart, labs and discussed the procedure including the risks, benefits and alternatives for the proposed anesthesia with the patient or authorized representative who has indicated his/her understanding and acceptance.     Dental advisory given  Plan Discussed with: CRNA  Anesthesia Plan Comments:        Anesthesia Quick Evaluation

## 2018-04-21 NOTE — Op Note (Signed)
[facility] Patient Name: Samuel Coleman Procedure Date: 04/21/2018 MRN: [id number] Attending MD: Carol Ada , MD Date of Birth: 07-06-1945 CSN: [id number] Age: 73 Admit Type: Outpatient Procedure:                ERCP Indications:              Common bile duct stricture Providers:                Carol Ada, MD, Burtis Junes, RN, Charolette Child,                            Technician Referring MD:              Medicines:                General Anesthesia Complications:            No immediate complications. Estimated Blood Loss:     Estimated blood loss: none. Procedure:                Pre-Anesthesia Assessment:                           - Prior to the procedure, a History and Physical                            was performed, and patient medications and                            allergies were reviewed. The patient's tolerance of                            previous anesthesia was also reviewed. The risks                            and benefits of the procedure and the sedation                            options and risks were discussed with the patient.                            All questions were answered, and informed consent                            was obtained. Prior Anticoagulants: The patient has                            taken no previous anticoagulant or antiplatelet                            agents. ASA Grade Assessment: III - A patient with                            severe systemic disease. After reviewing the risks                            and benefits,  the patient was deemed in                            satisfactory condition to undergo the procedure.                           - Sedation was administered by an anesthesia                            professional. General anesthesia was attained.                           After obtaining informed consent, the scope was                            passed under direct vision. Throughout the                      procedure, the patient's blood pressure, pulse, and                            oxygen saturations were monitored continuously. The                            [id number]-Q180V ([id number] Olympus duodenoscope was                            introduced through the mouth, and used to inject                            contrast into and used to inject contrast into the                            bile duct. The ERCP was accomplished without                            difficulty. The patient tolerated the procedure                            well. Scope In: Scope Out: Findings:      The major papilla was normal. The bile duct was deeply cannulated with       the short-nosed traction sphincterotome. Contrast was injected. I       personally interpreted the bile duct images. There was brisk flow of       contrast through the ducts. Image quality was excellent. Contrast       extended to the hepatic ducts. The middle third of the main bile duct       contained a single severe segmental stenosis 20 mm in length. A short       0.035 inch Soft Jagwire was passed into the biliary tree. A 10 mm       biliary sphincterotomy was made with a traction (standard)       sphincterotome using ERBE electrocautery. There was no       post-sphincterotomy bleeding. Cells for cytology were obtained by       brushing  in the middle third of the main bile duct. One 8.5 Fr by 5 cm       plastic biliary stent with a single external flap and a single internal       flap was placed 4.5 cm into the common bile duct. Bile flowed through       the stent. The stent was in good position.      Cannulation of the bile duct was performed with ease. There was no entry       into the PD. The guidewire was secured in the left intrahepatic ducts.       Contrast injection revealed dilated proximal CBD and intrahepatic ducts.       There was a stricture in the mid CBD that measured, approximately 2 cm,       which was  consistent with the MRCP findings. Brushings of the area were       performed and then an 8.5 Fr x 5 cm stent was inserted with ease.       Excellent drainage was achieved. The patient received Unasyn       preprocedure. Impression:               - The major papilla appeared normal.                           - A single severe segmental biliary stricture was                            found in the middle third of the main bile duct.                            The stricture was secondary to primary sclerosing                            cholangitis.                           - A biliary sphincterotomy was performed.                           - Cells for cytology obtained in the middle third                            of the main bile duct. Moderate Sedation:      Not Applicable - Patient had care per Anesthesia. Recommendation:           - Patient has a contact number available for                            emergencies. The signs and symptoms of potential                            delayed complications were discussed with the                            patient. Return to normal activities tomorrow.  Written discharge instructions were provided to the                            patient.                           - Resume regular diet.                           - Await cytology results.                           - Augmentin 875 mg BID x 3 days. Procedure Code(s):        --- Professional ---                           [phone], Endoscopic retrograde                            cholangiopancreatography (ERCP); with placement of                            endoscopic stent into biliary or pancreatic duct,                            including pre- and post-dilation and guide wire                            passage, when performed, including sphincterotomy,                            when performed, each stent                           [id number], Endoscopic catheterization of  the biliary                            ductal system, radiological supervision and                            interpretation Diagnosis Code(s):        --- Professional ---                           K83.1, Obstruction of bile duct CPT copyright 2018 [employer]. All rights reserved. The codes documented in this report are preliminary and upon coder review may  be revised to meet current compliance requirements. Carol Ada, MD Carol Ada, MD 04/21/2018 4:21:32 PM This report has been signed electronically. Number of Addenda: 0

## 2018-04-21 NOTE — Discharge Instructions (Signed)

## 2018-04-22 ENCOUNTER — Encounter (HOSPITAL_COMMUNITY): Payer: Self-pay

## 2018-04-23 ENCOUNTER — Encounter (HOSPITAL_COMMUNITY): Payer: Self-pay | Admitting: Gastroenterology

## 2018-04-24 ENCOUNTER — Encounter (HOSPITAL_COMMUNITY)
Admission: RE | Admit: 2018-04-24 | Discharge: 2018-04-24 | Disposition: A | Discharge status: Home / Self Care | Payer: Self-pay | Source: Ambulatory Visit | Attending: Cardiovascular Disease | Admitting: Cardiovascular Disease

## 2018-04-27 ENCOUNTER — Encounter (HOSPITAL_COMMUNITY)
Admission: RE | Admit: 2018-04-27 | Discharge: 2018-04-27 | Disposition: A | Discharge status: Home / Self Care | Payer: Self-pay | Source: Ambulatory Visit | Attending: Cardiovascular Disease | Admitting: Cardiovascular Disease

## 2018-04-27 DIAGNOSIS — I10 Essential (primary) hypertension: Secondary | ICD-10-CM | POA: Insufficient documentation

## 2018-04-27 DIAGNOSIS — I493 Ventricular premature depolarization: Secondary | ICD-10-CM | POA: Insufficient documentation

## 2018-04-27 DIAGNOSIS — E785 Hyperlipidemia, unspecified: Secondary | ICD-10-CM | POA: Insufficient documentation

## 2018-04-27 DIAGNOSIS — I2510 Atherosclerotic heart disease of native coronary artery without angina pectoris: Secondary | ICD-10-CM | POA: Insufficient documentation

## 2018-04-27 DIAGNOSIS — I6523 Occlusion and stenosis of bilateral carotid arteries: Secondary | ICD-10-CM | POA: Insufficient documentation

## 2018-04-27 DIAGNOSIS — I712 Thoracic aortic aneurysm, without rupture: Secondary | ICD-10-CM | POA: Insufficient documentation

## 2018-04-28 ENCOUNTER — Emergency Department (HOSPITAL_BASED_OUTPATIENT_CLINIC_OR_DEPARTMENT_OTHER): Payer: Medicare Other

## 2018-04-28 ENCOUNTER — Other Ambulatory Visit: Payer: Self-pay

## 2018-04-28 ENCOUNTER — Emergency Department (HOSPITAL_BASED_OUTPATIENT_CLINIC_OR_DEPARTMENT_OTHER)
Admission: EM | Admit: 2018-04-28 | Discharge: 2018-04-28 | Disposition: A | Discharge status: Home / Self Care | Payer: Medicare Other | Attending: Emergency Medicine | Admitting: Emergency Medicine

## 2018-04-28 ENCOUNTER — Encounter (HOSPITAL_BASED_OUTPATIENT_CLINIC_OR_DEPARTMENT_OTHER): Payer: Self-pay

## 2018-04-28 DIAGNOSIS — S032XXA Dislocation of tooth, initial encounter: Secondary | ICD-10-CM | POA: Diagnosis not present

## 2018-04-28 DIAGNOSIS — E119 Type 2 diabetes mellitus without complications: Secondary | ICD-10-CM | POA: Diagnosis not present

## 2018-04-28 DIAGNOSIS — Y92008 Other place in unspecified non-institutional (private) residence as the place of occurrence of the external cause: Secondary | ICD-10-CM | POA: Diagnosis not present

## 2018-04-28 DIAGNOSIS — W19XXXA Unspecified fall, initial encounter: Secondary | ICD-10-CM

## 2018-04-28 DIAGNOSIS — S0990XA Unspecified injury of head, initial encounter: Secondary | ICD-10-CM | POA: Insufficient documentation

## 2018-04-28 DIAGNOSIS — Y999 Unspecified external cause status: Secondary | ICD-10-CM | POA: Diagnosis not present

## 2018-04-28 DIAGNOSIS — I2510 Atherosclerotic heart disease of native coronary artery without angina pectoris: Secondary | ICD-10-CM | POA: Insufficient documentation

## 2018-04-28 DIAGNOSIS — J45909 Unspecified asthma, uncomplicated: Secondary | ICD-10-CM | POA: Diagnosis not present

## 2018-04-28 DIAGNOSIS — W0110XA Fall on same level from slipping, tripping and stumbling with subsequent striking against unspecified object, initial encounter: Secondary | ICD-10-CM | POA: Insufficient documentation

## 2018-04-28 DIAGNOSIS — S00511A Abrasion of lip, initial encounter: Secondary | ICD-10-CM | POA: Insufficient documentation

## 2018-04-28 DIAGNOSIS — Z7984 Long term (current) use of oral hypoglycemic drugs: Secondary | ICD-10-CM | POA: Diagnosis not present

## 2018-04-28 DIAGNOSIS — Z87891 Personal history of nicotine dependence: Secondary | ICD-10-CM | POA: Insufficient documentation

## 2018-04-28 DIAGNOSIS — Y939 Activity, unspecified: Secondary | ICD-10-CM | POA: Diagnosis not present

## 2018-04-28 DIAGNOSIS — S93402A Sprain of unspecified ligament of left ankle, initial encounter: Secondary | ICD-10-CM | POA: Diagnosis not present

## 2018-04-28 DIAGNOSIS — Z79899 Other long term (current) drug therapy: Secondary | ICD-10-CM | POA: Diagnosis not present

## 2018-04-28 DIAGNOSIS — Z951 Presence of aortocoronary bypass graft: Secondary | ICD-10-CM | POA: Insufficient documentation

## 2018-04-28 DIAGNOSIS — I10 Essential (primary) hypertension: Secondary | ICD-10-CM | POA: Insufficient documentation

## 2018-04-28 DIAGNOSIS — Z7982 Long term (current) use of aspirin: Secondary | ICD-10-CM | POA: Insufficient documentation

## 2018-04-28 DIAGNOSIS — K0889 Other specified disorders of teeth and supporting structures: Secondary | ICD-10-CM
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
## 2018-04-28 NOTE — ED Provider Notes (Signed)
[facility] EMERGENCY DEPARTMENT Provider Note   CSN: [id number] Arrival date & time: 04/28/18  2101    History   Chief Complaint Chief Complaint  Patient presents with  . Fall    HPI Samuel Coleman is a 73 y.o. male.     Patient is a 73 year old male with extensive past medical history including coronary artery disease, diabetes, GERD.  He presents today for evaluation of fall.  He was returning home this evening from dinner.  As he was walking into the house he tripped over the stair and fell forward.  He hit his mouth on a planter and caused an abrasion to his lip and his front tooth to loosen.  He denies any loss of consciousness or neck pain.  He also suffered abrasions to his knees and has pain in his left ankle.  The history is provided by the patient.  Fall  This is a new problem. Episode onset: 6:30 PM. The problem occurs constantly. The problem has not changed since onset.The symptoms are aggravated by walking. Nothing relieves the symptoms. He has tried nothing for the symptoms.    Past Medical History:  Diagnosis Date  . Anxiety   . Arthritis   . Asthma    exercise induced  . Back pain, chronic    gets injections in back  . CAD (coronary artery disease)    a.  prior inferior MI; s/p 3V CABG 01/01/10 (LIMA to LAD, SVG to OM, SVG to RCA- Dr.Bartle);  b. Last Myoview (05/2012):  Normal; EF 70% // c. Myoview 2/18: EF 60, no ischemia or infarction, Normal Study  . Carotid stenosis    Carotid US 03/2016 1-39% BICA  . Diabetes mellitus type 2 in nonobese (HCC)   . Frequent PVCs   . GERD (gastroesophageal reflux disease)   . Gout   . History of kidney stones    no problems with stones  . Hx of echocardiogram    Echo (01/2010):  EF 55-60%, ? Apical HK, mild BAE, atrial septal aneurysm.  Marland Kitchen Hyperlipidemia   . Hypertension   . Ischemic colitis ([place])    noted on CT back in August 2013 possible related to hypotension  . Pneumonia    x 2  . Premature  atrial contractions   . PUD (peptic ulcer disease) 1974  . ST elevation myocardial infarction (STEMI) of inferior wall ([place]) 2011   s/p CABG 2011  . Thoracic aortic aneurysm ([place])    a. last assessed 11/2017 3.6x3.3cm.  . 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 Otitis externa 08/04/2017  . Anxiety 08/04/2017  . S/P left inguinal hernia repair 11/02/2014  . Carotid artery disease ([place]) 03/29/2013  . S/P right inguinal hernia repair 12/16/2012  . Chest pain 12/07/2011  . Abdominal pain, other specified site 12/07/2011  . Other and unspecified noninfectious gastroenteritis and colitis(558.9) 10/05/2011  . 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 Right inguinal hernia-scrotum 08/23/2011  . Carotid bruit 01/16/2011  . Coronary artery disease involving native coronary artery of native heart without angina pectoris 07/19/2010  . Hyperlipidemia 01/15/2010  . GOUT 01/15/2010  . Essential hypertension 01/15/2010  . Aneurysm of thoracic aorta ([place]) 01/15/2010    Past Surgical History:  Procedure Laterality Date  . BILIARY STENT PLACEMENT N/A 04/21/2018   Procedure: BILIARY STENT PLACEMENT;  Surgeon: Carol Ada, MD;  Location: [facility];  Service: Endoscopy;  Laterality: N/A;  . CARDIAC CATHETERIZATION    . CHEST TUBE INSERTION  1992   fx lt ribs-collapsed lung  . CORONARY ARTERY BYPASS GRAFT  11/11   X 3 VESSELS  . cyst removed from back of neck    . cyst removel on right buttocks - 2014    . EAR CYST EXCISION Left 09/02/2012   Procedure: EXCISION SEBACEOUS CYST LEFT POSTERIOR LEG;  Surgeon: Pedro Earls, MD;  Location: [facility];  Service: General;  Laterality: Left;  . ENDOSCOPIC RETROGRADE CHOLANGIOPANCREATOGRAPHY (ERCP) WITH PROPOFOL N/A 04/21/2018   Procedure: ENDOSCOPIC RETROGRADE CHOLANGIOPANCREATOGRAPHY (ERCP) WITH PROPOFOL;  Surgeon: Carol Ada, MD;  Location: [facility]  ENDOSCOPY;  Service: Endoscopy;  Laterality: N/A;  . Excision of deep left neck mass. SURGEON:  Leonides Sake. Lucia Gaskins, M.D.  Cramer.Melena  . EYE SURGERY  both cararacts  . HERNIA REPAIR    . INGUINAL HERNIA REPAIR Right 12/02/2012   Procedure: OPEN RIGHT INGUINAL HERINA;  Surgeon: Pedro Earls, MD;  Location: [facility];  Service: General;  Laterality: Right;  . INGUINAL HERNIA REPAIR Left 11/02/2014   Procedure: OPEN LEFT INGUINAL HERNIA REPAIR WITH MESH;  Surgeon: Johnathan Hausen, MD;  Location: [facility];  Service: General;  Laterality: Left;  . INSERTION OF MESH Right 12/02/2012   Procedure: INSERTION OF MESH;  Surgeon: Pedro Earls, MD;  Location: [facility];  Service: General;  Laterality: Right;  . LUMBAR LAMINECTOMY/DECOMPRESSION MICRODISCECTOMY N/A 01/26/2015   Procedure: Lumbar 5-sacrum 1 decompression;  Surgeon: Phylliss Bob, MD;  Location: [facility];  Service: Orthopedics;  Laterality: N/A;  . SPHINCTEROTOMY  04/21/2018   Procedure: SPHINCTEROTOMY;  Surgeon: Carol Ada, MD;  Location: [facility];  Service: Endoscopy;;  . TONSILLECTOMY          Home Medications    Prior to Admission medications   Medication Sig Start Date End Date Taking? Authorizing Provider  acetaminophen (TYLENOL) 500 MG tablet Take 1,000 mg by mouth daily as needed for moderate pain or headache.    [provider]  allopurinol (ZYLOPRIM) 300 MG tablet Take 300 mg by mouth every morning.     [provider]  Ascorbic Acid (VITAMIN C) 1000 MG tablet Take 1,000 mg by mouth every morning.     [provider]  aspirin 81 MG tablet Take 81 mg by mouth daily.    [provider]  camphor-phenol (CAMPHO-PHENIQUE) 10.8-4.7 % LIQD Apply 1 application topically daily as needed (fever blisters).    [provider]  cholecalciferol (VITAMIN D3) 25 MCG (1000 UT) tablet Take 1,000 Units by mouth daily.    [provider]  clotrimazole (LOTRIMIN) 1 % cream Apply 1 application  topically daily as needed (athlete's foot).    [provider]  clotrimazole (MYCELEX) 10 MG troche Take 10 mg by mouth 5 (five) times daily.    [provider]  clotrimazole-betamethasone (LOTRISONE) cream Apply 1 application topically 2 (two) times daily. Apply to both feet 03/21/16   [provider]  Digestive Enzyme CAPS Take 2 capsules by mouth 3 (three) times daily with meals.    [provider]  diphenhydrAMINE (BENADRYL) 12.5 MG/5ML liquid Take 12.5-25 mg by mouth daily as needed for allergies.    [provider]  diphenhydrAMINE (BENADRYL) 50 MG capsule Take 50 mg by mouth at bedtime as needed for sleep.    [provider]  hydrocortisone cream 1 % Apply 1 application topically as needed (for itching on ears or feet).  [provider]  lisinopril (PRINIVIL,ZESTRIL) 40 MG tablet Take 1 tablet (40 mg total) by mouth daily. 12/25/17   Dunn, Nedra Hai, PA-C  Magnesium Oxide 400 (240 Mg) MG TABS Take 2 tablets (800 mg total) by mouth 2 (two) times daily. 03/13/18   [name], MD  metFORMIN (GLUCOPHAGE) 1000 MG tablet Take 1,000 mg by mouth 2 (two) times daily with a meal. 12/25/17   [provider]  methocarbamol (ROBAXIN) 500 MG tablet Take 500 mg by mouth 4 (four) times daily as needed for muscle spasms.     [provider]  metoprolol tartrate (LOPRESSOR) 50 MG tablet Take 1.5 tablets (75 mg total) by mouth 2 (two) times daily. Patient taking differently: Take 50 mg by mouth 2 (two) times daily.  01/15/18   [name], MD  Multiple Vitamin (MULTIVITAMIN WITH MINERALS) TABS tablet Take 1 tablet by mouth every morning.     [provider]  mupirocin ointment (BACTROBAN) 2 % Place 1 application into the nose daily as needed (wound care).    [provider]  mycophenolate (CELLCEPT) 500 MG tablet Take 500 mg by mouth 2 (two) times daily.  04/07/18   [provider]    neomycin-bacitracin-polymyxin (NEOSPORIN) [phone] ointment Apply 1 application topically 2 (two) times daily as needed (FOR FOOT IRRITATION).     [provider]  nitroGLYCERIN (NITROSTAT) 0.4 MG SL tablet Place 1 tablet (0.4 mg total) under the tongue every 5 (five) minutes as needed for chest pain. 11/26/16   Daune Perch, NP  Polyvinyl Alcohol-Povidone (REFRESH OP) Place 1 drop into both eyes daily as needed (dry eyes).    [provider]  predniSONE (DELTASONE) 1 MG tablet Take 4 mg by mouth daily with breakfast.    [provider]  silver sulfADIAZINE (SILVADENE) 1 % cream Apply 1 application topically as needed (sores).  06/17/17 06/17/18  [provider]  simvastatin (ZOCOR) 40 MG tablet Take 40 mg by mouth daily.     [provider]  sitaGLIPtin (JANUVIA) 100 MG tablet Take 100 mg by mouth daily.    [provider]  ursodiol (ACTIGALL) 300 MG capsule Take 300 mg by mouth 4 (four) times daily.  11/07/17   [provider]  vitamin B-12 (CYANOCOBALAMIN) 1000 MCG tablet Take 1,000 mcg by mouth daily.    [provider]  vitamin E 400 UNIT capsule Take 400 Units by mouth daily.    [provider]    Family History Family History  Problem Relation Age of Onset  . Heart failure Mother   . Pneumonia Father   . Lung cancer Sister   . Esophageal cancer Sister   . Pancreatic cancer Maternal Grandmother     Social History Social History   Tobacco Use  . Smoking status: Former Smoker    Last attempt to quit: 02/25/1974    Years since quitting: 44.2  . Smokeless tobacco: Never Used  Substance Use Topics  . Alcohol use: Yes    Comment: rarely  . Drug use: No     Allergies   Clindamycin/lincomycin and Levofloxacin   Review of Systems Review of Systems  All other systems reviewed and are negative.    Physical Exam Updated Vital Signs BP 132/84 (BP Location: Left Arm)   Pulse 87   Temp 98.3 F  (36.8 C) (Oral)   Resp (!) 185   SpO2 100%   Physical Exam Vitals signs and nursing note reviewed.  Constitutional:  General: He is not in acute distress.    Appearance: He is well-developed. He is not diaphoretic.  HENT:     Head: Normocephalic and atraumatic.     Mouth/Throat:     Comments: There are superficial abrasions to the upper lip.  The right front tooth is mildly loose and tender to palpation, but is firmly embedded in the socket and appears in proper alignment. Eyes:     Extraocular Movements: Extraocular movements intact.     Pupils: Pupils are equal, round, and reactive to light.  Neck:     Musculoskeletal: Normal range of motion and neck supple.  Cardiovascular:     Rate and Rhythm: Normal rate and regular rhythm.     Heart sounds: No murmur. No friction rub.  Pulmonary:     Effort: Pulmonary effort is normal. No respiratory distress.     Breath sounds: Normal breath sounds. No wheezing or rales.  Abdominal:     General: Bowel sounds are normal. There is no distension.     Palpations: Abdomen is soft.     Tenderness: There is no abdominal tenderness.  Musculoskeletal: Normal range of motion.  Skin:    General: Skin is warm and dry.  Neurological:     General: No focal deficit present.     Mental Status: He is alert and oriented to person, place, and time.     Cranial Nerves: No cranial nerve deficit.     Sensory: No sensory deficit.     Motor: No weakness.     Coordination: Coordination normal.      ED Treatments / Results  Labs (all labs ordered are listed, but only abnormal results are displayed) Labs Reviewed - No data to display  EKG None  Radiology No results found.  Procedures Procedures (including critical care time)  Medications Ordered in ED Medications - No data to display   Initial Impression / Assessment and Plan / ED Course  I have reviewed the triage vital signs and the nursing notes.  Pertinent labs & imaging results  that were available during my care of the patient were reviewed by me and considered in my medical decision making (see chart for details).  Patient presents here with complaints of fall.  He tripped over his step walking into his home.  He has pain in his ankle and struck his head on the ground.  He has superficial lip abrasions and his right front tooth is slightly loosened, but firmly embedded in the socket.  He is neurologically intact and head CT is unremarkable.  X-rays of his left ankle are negative.  At this point, I feel as though discharge is appropriate.  He is to follow-up with primary doctor as needed.  Final Clinical Impressions(s) / ED Diagnoses   Final diagnoses:  None    ED Discharge Orders    None       Veryl Speak, MD 04/28/18 2303

## 2018-04-28 NOTE — Discharge Instructions (Addendum)
Tylenol 1000 mg every 8 hours as needed for pain.  Ice affected areas for 20 minutes every 2 hours while awake for the next 2 days.  Return to the emergency department if you develop worsening pain, high fever, or other new and concerning symptoms.

## 2018-04-29 ENCOUNTER — Encounter (HOSPITAL_COMMUNITY): Payer: Self-pay

## 2018-04-30 ENCOUNTER — Other Ambulatory Visit: Payer: Self-pay | Admitting: Gastroenterology

## 2018-05-01 ENCOUNTER — Encounter (HOSPITAL_COMMUNITY)
Admission: RE | Disposition: A | Discharge status: Home / Self Care | Payer: Self-pay | Source: Home / Self Care | Attending: Gastroenterology

## 2018-05-01 ENCOUNTER — Ambulatory Visit (HOSPITAL_COMMUNITY): Payer: Medicare Other

## 2018-05-01 ENCOUNTER — Encounter (HOSPITAL_COMMUNITY): Payer: Self-pay

## 2018-05-01 ENCOUNTER — Other Ambulatory Visit: Payer: Self-pay

## 2018-05-01 ENCOUNTER — Ambulatory Visit (HOSPITAL_COMMUNITY)
Admission: RE | Admit: 2018-05-01 | Discharge: 2018-05-01 | Disposition: A | Discharge status: Home / Self Care | Payer: Medicare Other | Attending: Gastroenterology | Admitting: Gastroenterology

## 2018-05-01 ENCOUNTER — Ambulatory Visit (HOSPITAL_COMMUNITY): Payer: Medicare Other | Admitting: Certified Registered Nurse Anesthetist

## 2018-05-01 ENCOUNTER — Encounter (HOSPITAL_COMMUNITY): Payer: Self-pay | Admitting: *Deleted

## 2018-05-01 DIAGNOSIS — Z7984 Long term (current) use of oral hypoglycemic drugs: Secondary | ICD-10-CM | POA: Insufficient documentation

## 2018-05-01 DIAGNOSIS — C240 Malignant neoplasm of extrahepatic bile duct: Secondary | ICD-10-CM | POA: Diagnosis not present

## 2018-05-01 DIAGNOSIS — K8309 Other cholangitis: Secondary | ICD-10-CM | POA: Insufficient documentation

## 2018-05-01 DIAGNOSIS — I491 Atrial premature depolarization: Secondary | ICD-10-CM | POA: Diagnosis not present

## 2018-05-01 DIAGNOSIS — Z881 Allergy status to other antibiotic agents status: Secondary | ICD-10-CM | POA: Insufficient documentation

## 2018-05-01 DIAGNOSIS — K219 Gastro-esophageal reflux disease without esophagitis: Secondary | ICD-10-CM | POA: Insufficient documentation

## 2018-05-01 DIAGNOSIS — I2510 Atherosclerotic heart disease of native coronary artery without angina pectoris: Secondary | ICD-10-CM | POA: Insufficient documentation

## 2018-05-01 DIAGNOSIS — Z87891 Personal history of nicotine dependence: Secondary | ICD-10-CM | POA: Insufficient documentation

## 2018-05-01 DIAGNOSIS — Z7982 Long term (current) use of aspirin: Secondary | ICD-10-CM | POA: Diagnosis not present

## 2018-05-01 DIAGNOSIS — E785 Hyperlipidemia, unspecified: Secondary | ICD-10-CM | POA: Insufficient documentation

## 2018-05-01 DIAGNOSIS — J45909 Unspecified asthma, uncomplicated: Secondary | ICD-10-CM | POA: Diagnosis not present

## 2018-05-01 DIAGNOSIS — I10 Essential (primary) hypertension: Secondary | ICD-10-CM | POA: Diagnosis not present

## 2018-05-01 DIAGNOSIS — K831 Obstruction of bile duct: Secondary | ICD-10-CM | POA: Diagnosis not present

## 2018-05-01 DIAGNOSIS — Z951 Presence of aortocoronary bypass graft: Secondary | ICD-10-CM | POA: Diagnosis not present

## 2018-05-01 DIAGNOSIS — Z888 Allergy status to other drugs, medicaments and biological substances status: Secondary | ICD-10-CM | POA: Diagnosis not present

## 2018-05-01 DIAGNOSIS — Z8711 Personal history of peptic ulcer disease: Secondary | ICD-10-CM | POA: Insufficient documentation

## 2018-05-01 DIAGNOSIS — M109 Gout, unspecified: Secondary | ICD-10-CM | POA: Insufficient documentation

## 2018-05-01 DIAGNOSIS — F419 Anxiety disorder, unspecified: Secondary | ICD-10-CM | POA: Insufficient documentation

## 2018-05-01 DIAGNOSIS — E119 Type 2 diabetes mellitus without complications: Secondary | ICD-10-CM | POA: Diagnosis not present

## 2018-05-01 DIAGNOSIS — M1990 Unspecified osteoarthritis, unspecified site: Secondary | ICD-10-CM | POA: Insufficient documentation

## 2018-05-01 DIAGNOSIS — Z7952 Long term (current) use of systemic steroids: Secondary | ICD-10-CM | POA: Diagnosis not present

## 2018-05-01 DIAGNOSIS — I252 Old myocardial infarction: Secondary | ICD-10-CM | POA: Insufficient documentation

## 2018-05-01 DIAGNOSIS — K8689 Other specified diseases of pancreas: Secondary | ICD-10-CM

## 2018-05-01 HISTORY — PX: SPYGLASS CHOLANGIOSCOPY: SHX5441

## 2018-05-01 HISTORY — PX: BILIARY STENT PLACEMENT: SHX5538

## 2018-05-01 HISTORY — PX: SPHINCTEROTOMY: SHX5544

## 2018-05-01 HISTORY — PX: STENT REMOVAL: SHX6421

## 2018-05-01 HISTORY — PX: ENDOSCOPIC RETROGRADE CHOLANGIOPANCREATOGRAPHY (ERCP) WITH PROPOFOL: SHX5810

## 2018-05-01 LAB — GLUCOSE, CAPILLARY: Glucose-Capillary: 94 mg/dL (ref 70–99)

## 2018-05-01 SURGERY — ENDOSCOPIC RETROGRADE CHOLANGIOPANCREATOGRAPHY (ERCP) WITH PROPOFOL
Anesthesia: General

## 2018-05-01 MED ORDER — SODIUM CHLORIDE 0.9 % IV SOLN: INTRAVENOUS | Status: DC | PRN

## 2018-05-01 MED ORDER — LACTATED RINGERS IV SOLN
INTRAVENOUS | Status: DC | PRN
  Administered 2018-05-01 (×2): via INTRAVENOUS

## 2018-05-01 MED ORDER — SODIUM CHLORIDE 0.9 % IV SOLN: 1.5000 g | Freq: Four times a day (QID) | INTRAVENOUS | Status: DC

## 2018-05-01 MED ORDER — FENTANYL CITRATE (PF) 100 MCG/2ML IJ SOLN
INTRAMUSCULAR | Status: AC
  Filled 2018-05-01: qty 2

## 2018-05-01 MED ORDER — INDOMETHACIN 50 MG RE SUPP
RECTAL | Status: DC | PRN
  Administered 2018-05-01: 100 mg via RECTAL

## 2018-05-01 MED ORDER — PHENYLEPHRINE HCL 10 MG/ML IJ SOLN
INTRAMUSCULAR | Status: DC | PRN
  Administered 2018-05-01 (×8): 40 ug via INTRAVENOUS

## 2018-05-01 MED ORDER — FENTANYL CITRATE (PF) 100 MCG/2ML IJ SOLN
INTRAMUSCULAR | Status: DC | PRN
  Administered 2018-05-01: 100 ug via INTRAVENOUS
  Administered 2018-05-01: 50 ug via INTRAVENOUS

## 2018-05-01 MED ORDER — LACTATED RINGERS IV SOLN: INTRAVENOUS | Status: DC

## 2018-05-01 MED ORDER — PROPOFOL 10 MG/ML IV BOLUS
INTRAVENOUS | Status: AC
  Filled 2018-05-01: qty 20

## 2018-05-01 MED ORDER — ROCURONIUM BROMIDE 100 MG/10ML IV SOLN
INTRAVENOUS | Status: DC | PRN
  Administered 2018-05-01: 20 mg via INTRAVENOUS
  Administered 2018-05-01: 10 mg via INTRAVENOUS
  Administered 2018-05-01: 5 mg via INTRAVENOUS

## 2018-05-01 MED ORDER — INDOMETHACIN 50 MG RE SUPP
RECTAL | Status: AC
  Filled 2018-05-01: qty 2

## 2018-05-01 MED ORDER — SODIUM CHLORIDE 0.9 % IV SOLN: INTRAVENOUS | Status: DC

## 2018-05-01 MED ORDER — SODIUM CHLORIDE 0.9 % IV SOLN
INTRAVENOUS | Status: DC | PRN
  Administered 2018-05-01: 1.5 g via INTRAVENOUS

## 2018-05-01 MED ORDER — SUCCINYLCHOLINE CHLORIDE 20 MG/ML IJ SOLN
INTRAMUSCULAR | Status: DC | PRN
  Administered 2018-05-01: 130 mg via INTRAVENOUS

## 2018-05-01 MED ORDER — SUGAMMADEX SODIUM 500 MG/5ML IV SOLN
INTRAVENOUS | Status: DC | PRN
  Administered 2018-05-01: 200 mg via INTRAVENOUS

## 2018-05-01 MED ORDER — GLUCAGON HCL RDNA (DIAGNOSTIC) 1 MG IJ SOLR
INTRAMUSCULAR | Status: AC
  Filled 2018-05-01: qty 1

## 2018-05-01 MED ORDER — SODIUM CHLORIDE 0.9 % IV SOLN
1.5000 g | Freq: Once | INTRAVENOUS | Status: DC
  Filled 2018-05-01: qty 1.5

## 2018-05-01 MED ORDER — PROPOFOL 10 MG/ML IV BOLUS
INTRAVENOUS | Status: DC | PRN
  Administered 2018-05-01: 130 mg via INTRAVENOUS

## 2018-05-01 MED ORDER — LIDOCAINE HCL (CARDIAC) PF 100 MG/5ML IV SOSY
PREFILLED_SYRINGE | INTRAVENOUS | Status: DC | PRN
  Administered 2018-05-01: 80 mg via INTRAVENOUS
  Administered 2018-05-01: 100 mg via INTRAVENOUS

## 2018-05-01 MED ORDER — ONDANSETRON HCL 4 MG/2ML IJ SOLN
INTRAMUSCULAR | Status: DC | PRN
  Administered 2018-05-01: 4 mg via INTRAVENOUS

## 2018-05-01 MED ORDER — SODIUM CHLORIDE 0.9 % IV SOLN
INTRAVENOUS | Status: DC | PRN
  Administered 2018-05-01: 25 mL

## 2018-05-01 NOTE — Interval H&P Note (Signed)
History and Physical Interval Note:  05/01/2018 11:16 AM  Samuel Coleman  has presented today for surgery, with the diagnosis of dilated pancreatic and biliary ducts  The various methods of treatment have been discussed with the patient and family. After consideration of risks, benefits and other options for treatment, the patient has consented to  Procedure(s): ENDOSCOPIC RETROGRADE CHOLANGIOPANCREATOGRAPHY (ERCP) WITH PROPOFOL (N/A) SPYGLASS CHOLANGIOSCOPY (N/A) as a surgical intervention .  The patient's history has been reviewed, patient examined, no change in status, stable for surgery.  I have reviewed the patient's chart and labs.  Questions were answered to the patient's satisfaction.     Jenniferann Stuckert D

## 2018-05-01 NOTE — Transfer of Care (Addendum)
Immediate Anesthesia Transfer of Care Note  Patient: Sidonie Dickens  Procedure(s) Performed: ENDOSCOPIC RETROGRADE CHOLANGIOPANCREATOGRAPHY (ERCP) WITH PROPOFOL (N/A ) SPYGLASS CHOLANGIOSCOPY (N/A ) STENT REMOVAL SPHINCTEROTOMY BILIARY STENT PLACEMENT (N/A )  Patient Location: PACU and Endoscopy Unit  Anesthesia Type:General  Level of Consciousness: awake, oriented, drowsy, patient cooperative and responds to stimulation  Airway & Oxygen Therapy: Patient Spontanous Breathing and Patient connected to nasal cannula oxygen  Post-op Assessment: Report given to RN, Post -op Vital signs reviewed and stable and Patient moving all extremities  Post vital signs: Reviewed and stable  Last Vitals:  Vitals Value Taken Time  BP    Temp    Pulse    Resp    SpO2      Last Pain:  Vitals:   05/01/18 1135  TempSrc: Oral  PainSc: 0-No pain         Complications: No apparent anesthesia complications

## 2018-05-01 NOTE — Op Note (Signed)
[facility] Patient Name: Samuel Coleman Procedure Date: 05/01/2018 MRN: [id number] Attending MD: Carol Ada , MD Date of Birth: April 11, 1945 CSN: [id number] Age: 73 Admit Type: Outpatient Procedure:                ERCP Indications:              Malignant stricture of the common bile duct Providers:                Carol Ada, MD, Cleda Daub, RN, Cherylynn Ridges,                            Technician, Dion Saucier, CRNA Referring MD:              Medicines:                General Anesthesia Complications:            No immediate complications. Estimated Blood Loss:     Estimated blood loss: none. Procedure:                Pre-Anesthesia Assessment:                           - Prior to the procedure, a History and Physical                            was performed, and patient medications and                            allergies were reviewed. The patient's tolerance of                            previous anesthesia was also reviewed. The risks                            and benefits of the procedure and the sedation                            options and risks were discussed with the patient.                            All questions were answered, and informed consent                            was obtained. Prior Anticoagulants: The patient has                            taken no previous anticoagulant or antiplatelet                            agents. ASA Grade Assessment: III - A patient with                            severe systemic disease. After reviewing the risks  and benefits, the patient was deemed in                            satisfactory condition to undergo the procedure.                           - Sedation was administered by an anesthesia                            professional. General anesthesia was attained.                           After obtaining informed consent, the scope was                            passed under  direct vision. Throughout the                            procedure, the patient's blood pressure, pulse, and                            oxygen saturations were monitored continuously. The                            [id number]-Q180V ([id number]) Olympus duodenoscope was                            introduced through the mouth, and used to inject                            contrast into and used to inject contrast into the                            bile duct. The ERCP was technically difficult and                            complex. The patient tolerated the procedure well. Scope In: Scope Out: Findings:      A long 0.035 inch Soft Jagwire was passed into the biliary tree. A 5 mm       biliary sphincterotomy was made with a traction (standard)       sphincterotome using ERBE electrocautery. There was no       post-sphincterotomy bleeding. Cells for cytology were obtained by       brushing in the middle third of the main bile duct. The bile duct was       explored endoscopically using the SpyGlass direct visualization system.       The SpyGlass probe was advanced to the middle third of the main bile       duct. Visibility with the probe was excellent. Abnormal mucosa       characterized by texture change was found in the middle third of the       main bile duct. One 7 Fr by 5 cm plastic stent with a single external       flap and a single internal flap was placed 4.5 cm  into the common bile       duct. Bile flowed through the stent. The stent was in good position.      The plan was to obtain biopsies with the SpyGlass. The Spyglass scope       was entered into the duct and the malignant stricture was visualized.       Multiple attempts to deploy the biopsy forceps failed as the forceps       would not extend out of the scope. This was inspite if straightening out       the SpyGlass and even removing the scope from the duct. Repeated       brushings were then obtained. Impression:               -  Abnormal biliary tract mucosa was found.                           - A biliary sphincterotomy was performed.                           - Cells for cytology obtained in the middle third                            of the main bile duct.                           - One plastic stent was placed into the common bile                            duct. Moderate Sedation:      Not Applicable - Patient had care per Anesthesia. Recommendation:           - Patient has a contact number available for                            emergencies. The signs and symptoms of potential                            delayed complications were discussed with the                            patient. Return to normal activities tomorrow.                            Written discharge instructions were provided to the                            patient.                           - Resume regular diet.                           - Augmentin 875 mg BID x 3 days. Procedure Code(s):        --- Professional ---  23300, Endoscopic retrograde                            cholangiopancreatography (ERCP); with placement of                            endoscopic stent into biliary or pancreatic duct,                            including pre- and post-dilation and guide wire                            passage, when performed, including sphincterotomy,                            when performed, each stent                           76226, Endoscopic cannulation of papilla with                            direct visualization of pancreatic/common bile                            duct(s) (List separately in addition to code(s) for                            primary procedure) Diagnosis Code(s):        --- Professional ---                           K83.9, Disease of biliary tract, unspecified                           K83.1, Obstruction of bile duct CPT copyright 2018 American Medical Association. All rights  reserved. The codes documented in this report are preliminary and upon coder review may  be revised to meet current compliance requirements. Carol Ada, MD Carol Ada, MD 05/01/2018 1:16:09 PM This report has been signed electronically. Number of Addenda: 0

## 2018-05-01 NOTE — Anesthesia Procedure Notes (Signed)
Procedure Name: Intubation Date/Time: 05/01/2018 12:09 PM Performed by: Lillia Abed, MD Pre-anesthesia Checklist: Patient identified, Emergency Drugs available, Suction available, Patient being monitored and Timeout performed Patient Re-evaluated:Patient Re-evaluated prior to induction Oxygen Delivery Method: Circle system utilized Preoxygenation: Pre-oxygenation with 100% oxygen Induction Type: IV induction Laryngoscope Size: Mac and 4 Grade View: Grade II Tube type: Oral Tube size: 7.5 mm Number of attempts: 1 Airway Equipment and Method: Stylet Placement Confirmation: ETT inserted through vocal cords under direct vision and positive ETCO2 Secured at: 22 cm Tube secured with: Tape Dental Injury: Teeth and Oropharynx as per pre-operative assessment

## 2018-05-01 NOTE — Anesthesia Preprocedure Evaluation (Signed)
Anesthesia Evaluation  Patient identified by MRN, date of birth, ID band Patient awake    Reviewed: Allergy & Precautions, NPO status , Patient's Chart, lab work & pertinent test results  Airway Mallampati: I  TM Distance: >3 FB Neck ROM: Full    Dental   Pulmonary former smoker,    Pulmonary exam normal        Cardiovascular hypertension, Pt. on medications + CAD and + Past MI  Normal cardiovascular exam     Neuro/Psych Anxiety    GI/Hepatic GERD  Medicated and Controlled,  Endo/Other  diabetes, Type 2, Oral Hypoglycemic Agents  Renal/GU      Musculoskeletal   Abdominal   Peds  Hematology   Anesthesia Other Findings   Reproductive/Obstetrics                             Anesthesia Physical Anesthesia Plan  ASA: II  Anesthesia Plan: General   Post-op Pain Management:    Induction: Intravenous  PONV Risk Score and Plan: 2  Airway Management Planned: Oral ETT  Additional Equipment:   Intra-op Plan:   Post-operative Plan: Extubation in OR  Informed Consent: I have reviewed the patients History and Physical, chart, labs and discussed the procedure including the risks, benefits and alternatives for the proposed anesthesia with the patient or authorized representative who has indicated his/her understanding and acceptance.       Plan Discussed with: CRNA and Surgeon  Anesthesia Plan Comments:         Anesthesia Quick Evaluation

## 2018-05-01 NOTE — Discharge Instructions (Signed)
YOU HAD AN ENDOSCOPIC PROCEDURE TODAY: Refer to the procedure report and other information in the discharge instructions given to you for any specific questions about what was found during the examination. If this information does not answer your questions, please call [facility] [facility] at [phone] to clarify.   YOU SHOULD EXPECT: Some feelings of bloating in the abdomen. Passage of more gas than usual. Walking can help get rid of the air that was put into your GI tract during the procedure and reduce the bloating. If you had a lower endoscopy (such as a colonoscopy or flexible sigmoidoscopy) you may notice spotting of blood in your stool or on the toilet paper. Some abdominal soreness may be present for a day or two, also.  DIET: Your first meal following the procedure should be a light meal and then it is ok to progress to your normal diet. A half-sandwich or bowl of soup is an example of a good first meal. Heavy or fried foods are harder to digest and may make you feel nauseous or bloated. Drink plenty of fluids but you should avoid alcoholic beverages for 24 hours. If you had an esophageal dilation, please see attached information for diet.   ACTIVITY: Your care partner should take you home directly after the procedure. You should plan to take it easy, moving slowly for the rest of the day. You can resume normal activity the day after the procedure however YOU SHOULD NOT DRIVE, use power tools, machinery or perform tasks that involve climbing or major physical exertion for 24 hours (because of the sedation medicines used during the test).   SYMPTOMS TO REPORT IMMEDIATELY: A gastroenterologist can be reached at any hour. Please call [phone]  for any of the following symptoms:  Following lower endoscopy (colonoscopy, flexible sigmoidoscopy) Excessive amounts of blood in the stool  Significant tenderness, worsening of abdominal pains  Swelling of the abdomen that is new, acute  Fever of  100 or higher  Following upper endoscopy (EGD, EUS, ERCP, esophageal dilation) Vomiting of blood or coffee ground material  New, significant abdominal pain  New, significant chest pain or pain under the shoulder blades  Painful or persistently difficult swallowing  New shortness of breath  Black, tarry-looking or red, bloody stools  FOLLOW UP:  If any biopsies were taken you will be contacted by phone or by letter within the next 1-3 weeks. Call [phone]  if you have not heard about the biopsies in 3 weeks.  Please also call with any specific questions about appointments or follow up tests.     General Anesthesia, Adult, Care After This sheet gives you information about how to care for yourself after your procedure. Your health care provider may also give you more specific instructions. If you have problems or questions, contact your health care provider. What can I expect after the procedure? After the procedure, the following side effects are common:  Pain or discomfort at the IV site.  Nausea.  Vomiting.  Sore throat.  Trouble concentrating.  Feeling cold or chills.  Weak or tired.  Sleepiness and fatigue.  Soreness and body aches. These side effects can affect parts of the body that were not involved in surgery. Follow these instructions at home:  For at least 24 hours after the procedure:  Have a responsible adult stay with you. It is important to have someone help care for you until you are awake and alert.  Rest as needed.  Do not: ? Participate in activities  in which you could fall or become injured. ? Drive. ? Use heavy machinery. ? Drink alcohol. ? Take sleeping pills or medicines that cause drowsiness. ? Make important decisions or sign legal documents. ? Take care of children on your own. Eating and drinking  Follow any instructions from your health care provider about eating or drinking restrictions.  When you feel hungry, start by eating  small amounts of foods that are soft and easy to digest (bland), such as toast. Gradually return to your regular diet.  Drink enough fluid to keep your urine pale yellow.  If you vomit, rehydrate by drinking water, juice, or clear broth. General instructions  If you have sleep apnea, surgery and certain medicines can increase your risk for breathing problems. Follow instructions from your health care provider about wearing your sleep device: ? Anytime you are sleeping, including during daytime naps. ? While taking prescription pain medicines, sleeping medicines, or medicines that make you drowsy.  Return to your normal activities as told by your health care provider. Ask your health care provider what activities are safe for you.  Take over-the-counter and prescription medicines only as told by your health care provider.  If you smoke, do not smoke without supervision.  Keep all follow-up visits as told by your health care provider. This is important. Contact a health care provider if:  You have nausea or vomiting that does not get better with medicine.  You cannot eat or drink without vomiting.  You have pain that does not get better with medicine.  You are unable to pass urine.  You develop a skin rash.  You have a fever.  You have redness around your IV site that gets worse. Get help right away if:  You have difficulty breathing.  You have chest pain.  You have blood in your urine or stool, or you vomit blood. Summary  After the procedure, it is common to have a sore throat or nausea. It is also common to feel tired.  Have a responsible adult stay with you for the first 24 hours after general anesthesia. It is important to have someone help care for you until you are awake and alert.  When you feel hungry, start by eating small amounts of foods that are soft and easy to digest (bland), such as toast. Gradually return to your regular diet.  Drink enough fluid to keep  your urine pale yellow.  Return to your normal activities as told by your health care provider. Ask your health care provider what activities are safe for you. This information is not intended to replace advice given to you by your health care provider. Make sure you discuss any questions you have with your health care provider. Document Released: 05/20/2000 Document Revised: 09/27/2016 Document Reviewed: 09/27/2016 Elsevier Interactive Patient Education  2019 [employer].

## 2018-05-01 NOTE — Anesthesia Postprocedure Evaluation (Signed)
Anesthesia Post Note  Patient: Samuel Coleman  Procedure(s) Performed: ENDOSCOPIC RETROGRADE CHOLANGIOPANCREATOGRAPHY (ERCP) WITH PROPOFOL (N/A ) SPYGLASS CHOLANGIOSCOPY (N/A ) STENT REMOVAL SPHINCTEROTOMY BILIARY STENT PLACEMENT (N/A )     Patient location during evaluation: PACU Anesthesia Type: General Level of consciousness: awake and alert Pain management: pain level controlled Vital Signs Assessment: post-procedure vital signs reviewed and stable Respiratory status: spontaneous breathing, nonlabored ventilation and respiratory function stable Cardiovascular status: blood pressure returned to baseline and stable Postop Assessment: no apparent nausea or vomiting Anesthetic complications: no    Last Vitals:  Vitals:   05/01/18 1350 05/01/18 1400  BP: (!) 170/91 (!) 156/81  Pulse: 76 76  Resp: 15 16  Temp:    SpO2: 99% 98%    Last Pain:  Vitals:   05/01/18 1400  TempSrc:   PainSc: 0-No pain                 Lidia Collum

## 2018-05-02 ENCOUNTER — Encounter (HOSPITAL_COMMUNITY): Payer: Self-pay | Admitting: Gastroenterology

## 2018-05-04 ENCOUNTER — Encounter (HOSPITAL_COMMUNITY)
Admission: RE | Admit: 2018-05-04 | Discharge: 2018-05-04 | Disposition: A | Discharge status: Home / Self Care | Payer: Self-pay | Source: Ambulatory Visit | Attending: Cardiovascular Disease | Admitting: Cardiovascular Disease

## 2018-05-06 ENCOUNTER — Encounter (HOSPITAL_COMMUNITY): Payer: Self-pay

## 2018-05-08 ENCOUNTER — Other Ambulatory Visit: Payer: Self-pay

## 2018-05-08 ENCOUNTER — Encounter (HOSPITAL_COMMUNITY)
Admission: RE | Admit: 2018-05-08 | Discharge: 2018-05-08 | Disposition: A | Discharge status: Home / Self Care | Payer: Self-pay | Source: Ambulatory Visit | Attending: Cardiovascular Disease | Admitting: Cardiovascular Disease

## 2018-05-11 ENCOUNTER — Encounter (HOSPITAL_COMMUNITY): Payer: Self-pay

## 2018-05-11 ENCOUNTER — Telehealth (HOSPITAL_COMMUNITY): Payer: Self-pay | Admitting: *Deleted

## 2018-05-11 NOTE — Telephone Encounter (Signed)
Contacted patient to notify of Cardiac Rehab department closure x2 weeks. Pt verbalized understanding. Tenlee Wollin, Exercise Physiologist Cardiac and Pulmonary Rehabilitation  

## 2018-05-13 ENCOUNTER — Encounter (HOSPITAL_COMMUNITY): Payer: Self-pay

## 2018-05-15 ENCOUNTER — Encounter (HOSPITAL_COMMUNITY): Payer: Self-pay

## 2018-05-18 ENCOUNTER — Encounter (HOSPITAL_COMMUNITY): Payer: Self-pay

## 2018-05-19 ENCOUNTER — Telehealth (HOSPITAL_COMMUNITY): Payer: Self-pay | Admitting: *Deleted

## 2018-05-19 NOTE — Telephone Encounter (Signed)
Called to notify patient that the cardiac and pulmonary rehabilitation department will be closed for 4 weeks due to COVID-19 restrictions. Pt verbalized understanding. Patient is doing some walking at home. Sol Passer, MS, ACSM CEP

## 2018-05-20 ENCOUNTER — Encounter (HOSPITAL_COMMUNITY): Payer: Self-pay

## 2018-05-21 ENCOUNTER — Telehealth: Payer: Self-pay | Admitting: Cardiovascular Disease

## 2018-05-21 NOTE — Telephone Encounter (Signed)
Pt called and would like records sent to him.

## 2018-05-22 ENCOUNTER — Encounter (HOSPITAL_COMMUNITY): Payer: Self-pay

## 2018-05-25 ENCOUNTER — Encounter (HOSPITAL_COMMUNITY): Payer: Self-pay

## 2018-05-27 ENCOUNTER — Encounter (HOSPITAL_COMMUNITY): Payer: Self-pay

## 2018-05-28 ENCOUNTER — Encounter: Payer: Self-pay | Admitting: Cardiovascular Disease

## 2018-05-28 NOTE — Telephone Encounter (Signed)
  Patient is calling back requesting records again because he needs them to take to his cancer doctor at [facility]. Please contact patient with update.   Patient would also like to make sure that his office visit notes are included

## 2018-05-28 NOTE — Telephone Encounter (Signed)
error 

## 2018-05-28 NOTE — Telephone Encounter (Signed)
Waiting for echo dept to burn cds.

## 2018-05-29 ENCOUNTER — Encounter (HOSPITAL_COMMUNITY): Payer: Self-pay

## 2018-05-31 ENCOUNTER — Other Ambulatory Visit: Payer: Self-pay | Admitting: Cardiovascular Disease

## 2018-06-01 ENCOUNTER — Encounter (HOSPITAL_COMMUNITY): Payer: Self-pay

## 2018-06-03 ENCOUNTER — Encounter (HOSPITAL_COMMUNITY): Payer: Self-pay

## 2018-06-04 NOTE — Telephone Encounter (Signed)
Pt called because he said he received a call from the nurse, but he was unable to answer the call. He is currently admitted at [facility], and will not be able to get the records that were mailed to his house. Pt had an Exploratory Endoscopy yesterday to determine if his cancer had spread. He had complications(bleeding) , so he was admitted and will stay for another night.   Dr. Cephas Darby at the [place] [facility] is the one treating the patient at [facility], and he will need the records sent directly to him instead of at the Pt's house.   [place] [address]  [place], [place] [address]  Phone: [phone] Fax: [phone]

## 2018-06-04 NOTE — Telephone Encounter (Signed)
CDs received from echo dept on 06/03/18. CDs mailed to patient along with reports to [address] [place].

## 2018-06-05 ENCOUNTER — Encounter (HOSPITAL_COMMUNITY): Payer: Self-pay

## 2018-06-05 ENCOUNTER — Telehealth (HOSPITAL_COMMUNITY): Payer: Self-pay | Admitting: *Deleted

## 2018-06-05 NOTE — Telephone Encounter (Signed)
Called to notify patient that the cardiac and pulmonary rehabilitation department remains closed at this time due to COVID-19 restrictions. Message left on voicemail.   Sol Passer, MS, ACSM CEP 06/05/2018 1220

## 2018-06-08 ENCOUNTER — Encounter (HOSPITAL_COMMUNITY): Payer: Self-pay

## 2018-06-10 ENCOUNTER — Encounter (HOSPITAL_COMMUNITY): Payer: Self-pay

## 2018-06-12 ENCOUNTER — Encounter (HOSPITAL_COMMUNITY): Payer: Self-pay

## 2018-06-15 ENCOUNTER — Encounter (HOSPITAL_COMMUNITY): Payer: Self-pay

## 2018-06-17 ENCOUNTER — Encounter (HOSPITAL_COMMUNITY): Payer: Self-pay

## 2018-06-18 ENCOUNTER — Telehealth: Payer: Self-pay | Admitting: Cardiovascular Disease

## 2018-06-18 NOTE — Telephone Encounter (Signed)
Follow up    Patient is calling back to continue conversation from earlier. He states that him and the nurse was speaking but he had to end the call.

## 2018-06-18 NOTE — Telephone Encounter (Signed)
Thanks!    Chris.

## 2018-06-18 NOTE — Telephone Encounter (Signed)
Pt was dx w/ cancer, unsure of primary. Pt states he has been recovering from procedures including a liver bx which he had complications/ bleeding post. Pt states he is starting to feel somewhat better.  Pt was told by a nurse to call his cardiologist because of HR fluctuation. Pt taking metoprolol as prescribed. Pt only had one bp/hr to report. 116/60 hr 105 yesterday. * pt is currently at the eye dr and our conversation was cut short.  Pt was told to record bp/ p at home 2-3 x day for 2 days. To give Dr Angelena Form more information, pt said he could do that. Pt to call with readings or sooner if needed.

## 2018-06-18 NOTE — Telephone Encounter (Signed)
Lm, I will call back within the hour.

## 2018-06-18 NOTE — Telephone Encounter (Signed)
New Message    STAT if HR is under 50 or over 120 (normal HR is 60-100 beats per minute)  1)What is your heart rate? Resting hr  105 yesterday  2)Do you have a log of your heart rate readings (document readings)? Not recently   3)Do you have any other symptoms? Having more Pvcs, his visiting nurse told him to call.

## 2018-06-18 NOTE — Telephone Encounter (Signed)
Pt will record readings 2-3 x day through weekend and call back Monday to report. Pt verbalized understanding and was accepting of plan.

## 2018-06-19 ENCOUNTER — Encounter (HOSPITAL_COMMUNITY): Payer: Self-pay

## 2018-06-21 ENCOUNTER — Other Ambulatory Visit: Payer: Self-pay | Admitting: Cardiovascular Disease

## 2018-06-22 ENCOUNTER — Encounter (HOSPITAL_COMMUNITY): Payer: Self-pay

## 2018-06-24 ENCOUNTER — Encounter (HOSPITAL_COMMUNITY): Payer: Self-pay

## 2018-06-26 ENCOUNTER — Encounter (HOSPITAL_COMMUNITY): Payer: Medicare Other

## 2018-06-29 ENCOUNTER — Encounter (HOSPITAL_COMMUNITY): Payer: Medicare Other

## 2018-07-01 ENCOUNTER — Encounter (HOSPITAL_COMMUNITY): Payer: Medicare Other

## 2018-07-03 ENCOUNTER — Encounter (HOSPITAL_COMMUNITY): Payer: Medicare Other

## 2018-07-03 ENCOUNTER — Other Ambulatory Visit: Payer: Self-pay | Admitting: Gastroenterology

## 2018-07-06 ENCOUNTER — Encounter (HOSPITAL_COMMUNITY): Payer: Medicare Other

## 2018-07-08 ENCOUNTER — Encounter (HOSPITAL_COMMUNITY): Payer: Medicare Other

## 2018-07-10 ENCOUNTER — Encounter (HOSPITAL_COMMUNITY): Payer: Medicare Other

## 2018-07-13 ENCOUNTER — Encounter (HOSPITAL_COMMUNITY): Payer: Medicare Other

## 2018-07-15 ENCOUNTER — Encounter (HOSPITAL_COMMUNITY): Payer: Medicare Other

## 2018-07-17 ENCOUNTER — Encounter (HOSPITAL_COMMUNITY): Payer: Medicare Other

## 2018-07-22 ENCOUNTER — Encounter (HOSPITAL_COMMUNITY): Payer: Medicare Other

## 2018-07-24 ENCOUNTER — Encounter (HOSPITAL_COMMUNITY): Payer: Medicare Other

## 2018-07-27 ENCOUNTER — Encounter (HOSPITAL_COMMUNITY): Payer: Medicare Other

## 2018-07-29 ENCOUNTER — Encounter (HOSPITAL_COMMUNITY): Payer: Medicare Other

## 2018-07-31 ENCOUNTER — Encounter (HOSPITAL_COMMUNITY): Payer: Medicare Other

## 2018-08-03 ENCOUNTER — Encounter (HOSPITAL_COMMUNITY): Payer: Medicare Other

## 2018-08-05 ENCOUNTER — Encounter (HOSPITAL_COMMUNITY): Payer: Medicare Other

## 2018-08-06 ENCOUNTER — Inpatient Hospital Stay (HOSPITAL_COMMUNITY)
Admission: EM | Admit: 2018-08-06 | Discharge: 2018-08-07 | DRG: 444 | Disposition: A | Discharge status: Home / Self Care | Payer: Medicare Other | Attending: Internal Medicine | Admitting: Internal Medicine

## 2018-08-06 ENCOUNTER — Encounter (HOSPITAL_COMMUNITY): Payer: Self-pay

## 2018-08-06 ENCOUNTER — Other Ambulatory Visit: Payer: Self-pay

## 2018-08-06 DIAGNOSIS — Z951 Presence of aortocoronary bypass graft: Secondary | ICD-10-CM | POA: Diagnosis not present

## 2018-08-06 DIAGNOSIS — K831 Obstruction of bile duct: Secondary | ICD-10-CM | POA: Diagnosis present

## 2018-08-06 DIAGNOSIS — Z79899 Other long term (current) drug therapy: Secondary | ICD-10-CM

## 2018-08-06 DIAGNOSIS — Z87891 Personal history of nicotine dependence: Secondary | ICD-10-CM

## 2018-08-06 DIAGNOSIS — E119 Type 2 diabetes mellitus without complications: Secondary | ICD-10-CM | POA: Diagnosis present

## 2018-08-06 DIAGNOSIS — Z7982 Long term (current) use of aspirin: Secondary | ICD-10-CM

## 2018-08-06 DIAGNOSIS — Z8249 Family history of ischemic heart disease and other diseases of the circulatory system: Secondary | ICD-10-CM

## 2018-08-06 DIAGNOSIS — M3130 Wegener's granulomatosis without renal involvement: Secondary | ICD-10-CM | POA: Diagnosis present

## 2018-08-06 DIAGNOSIS — I491 Atrial premature depolarization: Secondary | ICD-10-CM | POA: Diagnosis present

## 2018-08-06 DIAGNOSIS — I252 Old myocardial infarction: Secondary | ICD-10-CM

## 2018-08-06 DIAGNOSIS — Z7952 Long term (current) use of systemic steroids: Secondary | ICD-10-CM | POA: Diagnosis not present

## 2018-08-06 DIAGNOSIS — K838 Other specified diseases of biliary tract: Secondary | ICD-10-CM

## 2018-08-06 DIAGNOSIS — I2510 Atherosclerotic heart disease of native coronary artery without angina pectoris: Secondary | ICD-10-CM | POA: Diagnosis present

## 2018-08-06 DIAGNOSIS — M1990 Unspecified osteoarthritis, unspecified site: Secondary | ICD-10-CM | POA: Diagnosis present

## 2018-08-06 DIAGNOSIS — Z20828 Contact with and (suspected) exposure to other viral communicable diseases: Secondary | ICD-10-CM | POA: Diagnosis present

## 2018-08-06 DIAGNOSIS — Z8711 Personal history of peptic ulcer disease: Secondary | ICD-10-CM

## 2018-08-06 DIAGNOSIS — E860 Dehydration: Secondary | ICD-10-CM | POA: Diagnosis present

## 2018-08-06 DIAGNOSIS — I712 Thoracic aortic aneurysm, without rupture, unspecified: Secondary | ICD-10-CM | POA: Diagnosis present

## 2018-08-06 DIAGNOSIS — E785 Hyperlipidemia, unspecified: Secondary | ICD-10-CM | POA: Diagnosis present

## 2018-08-06 DIAGNOSIS — Z801 Family history of malignant neoplasm of trachea, bronchus and lung: Secondary | ICD-10-CM

## 2018-08-06 DIAGNOSIS — K8301 Primary sclerosing cholangitis: Principal | ICD-10-CM | POA: Diagnosis present

## 2018-08-06 DIAGNOSIS — Z800 Family history of malignant neoplasm of digestive organs: Secondary | ICD-10-CM

## 2018-08-06 DIAGNOSIS — M109 Gout, unspecified: Secondary | ICD-10-CM | POA: Diagnosis present

## 2018-08-06 DIAGNOSIS — C23 Malignant neoplasm of gallbladder: Secondary | ICD-10-CM | POA: Diagnosis present

## 2018-08-06 DIAGNOSIS — C787 Secondary malignant neoplasm of liver and intrahepatic bile duct: Secondary | ICD-10-CM | POA: Diagnosis present

## 2018-08-06 DIAGNOSIS — K219 Gastro-esophageal reflux disease without esophagitis: Secondary | ICD-10-CM | POA: Diagnosis present

## 2018-08-06 DIAGNOSIS — Z881 Allergy status to other antibiotic agents status: Secondary | ICD-10-CM

## 2018-08-06 DIAGNOSIS — Z7984 Long term (current) use of oral hypoglycemic drugs: Secondary | ICD-10-CM

## 2018-08-06 DIAGNOSIS — Z66 Do not resuscitate: Secondary | ICD-10-CM | POA: Diagnosis present

## 2018-08-06 DIAGNOSIS — R7989 Other specified abnormal findings of blood chemistry: Secondary | ICD-10-CM

## 2018-08-06 DIAGNOSIS — D649 Anemia, unspecified: Secondary | ICD-10-CM | POA: Diagnosis present

## 2018-08-06 DIAGNOSIS — I10 Essential (primary) hypertension: Secondary | ICD-10-CM | POA: Diagnosis present

## 2018-08-06 DIAGNOSIS — C221 Intrahepatic bile duct carcinoma: Secondary | ICD-10-CM | POA: Diagnosis present

## 2018-08-06 DIAGNOSIS — E871 Hypo-osmolality and hyponatremia: Secondary | ICD-10-CM | POA: Diagnosis present

## 2018-08-06 LAB — GLUCOSE, CAPILLARY
Glucose-Capillary: 119 mg/dL — ABNORMAL HIGH (ref 70–99)
Glucose-Capillary: 162 mg/dL — ABNORMAL HIGH (ref 70–99)

## 2018-08-06 LAB — COMPREHENSIVE METABOLIC PANEL
ALT: 228 U/L — ABNORMAL HIGH (ref 0–44)
AST: 79 U/L — ABNORMAL HIGH (ref 15–41)
Albumin: 2.8 g/dL — ABNORMAL LOW (ref 3.5–5.0)
Alkaline Phosphatase: 885 U/L — ABNORMAL HIGH (ref 38–126)
Anion gap: 12 (ref 5–15)
BUN: 41 mg/dL — ABNORMAL HIGH (ref 8–23)
CO2: 23 mmol/L (ref 22–32)
Calcium: 8.9 mg/dL (ref 8.9–10.3)
Chloride: 97 mmol/L — ABNORMAL LOW (ref 98–111)
Creatinine, Ser: 0.82 mg/dL (ref 0.61–1.24)
GFR calc Af Amer: >60 mL/min (ref >60)
GFR calc non Af Amer: >60 mL/min (ref >60)
Glucose, Bld: 110 mg/dL — ABNORMAL HIGH (ref 70–99)
Potassium: 4.2 mmol/L (ref 3.5–5.1)
Sodium: 132 mmol/L — ABNORMAL LOW (ref 135–145)
Total Bilirubin: 1.3 mg/dL — ABNORMAL HIGH (ref 0.3–1.2)
Total Protein: 6.1 g/dL — ABNORMAL LOW (ref 6.5–8.1)

## 2018-08-06 LAB — CBC WITH DIFFERENTIAL/PLATELET
Abs Immature Granulocytes: 0.03 10*3/uL (ref 0.00–0.07)
Basophils Absolute: 0 10*3/uL (ref 0.0–0.1)
Basophils Relative: 0 %
Eosinophils Absolute: 0 10*3/uL (ref 0.0–0.5)
Eosinophils Relative: 0 %
HCT: 28.2 % — ABNORMAL LOW (ref 39.0–52.0)
Hemoglobin: 8.9 g/dL — ABNORMAL LOW (ref 13.0–17.0)
Immature Granulocytes: 1 %
Lymphocytes Relative: 17 %
Lymphs Abs: 0.4 10*3/uL — ABNORMAL LOW (ref 0.7–4.0)
MCH: 31 pg (ref 26.0–34.0)
MCHC: 31.6 g/dL (ref 30.0–36.0)
MCV: 98.3 fL (ref 80.0–100.0)
Monocytes Absolute: 0.1 10*3/uL (ref 0.1–1.0)
Monocytes Relative: 4 %
Neutro Abs: 1.8 10*3/uL (ref 1.7–7.7)
Neutrophils Relative %: 78 %
Platelets: 69 10*3/uL — ABNORMAL LOW (ref 150–400)
RBC: 2.87 MIL/uL — ABNORMAL LOW (ref 4.22–5.81)
RDW: 13.7 % (ref 11.5–15.5)
WBC: 2.4 10*3/uL — ABNORMAL LOW (ref 4.0–10.5)
nRBC: 0 % (ref 0.0–0.2)

## 2018-08-06 LAB — SARS CORONAVIRUS 2 BY RT PCR (HOSPITAL ORDER, PERFORMED IN ~~LOC~~ HOSPITAL LAB): SARS Coronavirus 2: NEGATIVE

## 2018-08-06 LAB — CBG MONITORING, ED: Glucose-Capillary: 107 mg/dL — ABNORMAL HIGH (ref 70–99)

## 2018-08-06 LAB — TYPE AND SCREEN
ABO/RH(D): O POS
Antibody Screen: NEGATIVE

## 2018-08-06 LAB — MRSA PCR SCREENING: MRSA by PCR: NEGATIVE

## 2018-08-06 LAB — LIPASE, BLOOD: Lipase: 70 U/L — ABNORMAL HIGH (ref 11–51)

## 2018-08-06 LAB — PROTIME-INR
INR: 1 (ref 0.8–1.2)
Prothrombin Time: 12.8 seconds (ref 11.4–15.2)

## 2018-08-06 MED ORDER — PREDNISONE 1 MG PO TABS
4.0000 mg | ORAL_TABLET | Freq: Every day | ORAL | Status: DC
  Filled 2018-08-06: qty 4

## 2018-08-06 MED ORDER — SODIUM CHLORIDE 0.9 % IV SOLN: INTRAVENOUS | Status: DC

## 2018-08-06 MED ORDER — ACETAMINOPHEN 500 MG PO TABS: 1000.0000 mg | ORAL_TABLET | Freq: Every day | ORAL | Status: DC | PRN

## 2018-08-06 MED ORDER — METOPROLOL TARTRATE 50 MG PO TABS
50.0000 mg | ORAL_TABLET | Freq: Two times a day (BID) | ORAL | Status: DC
  Administered 2018-08-06 – 2018-08-07 (×2): 50 mg via ORAL
  Filled 2018-08-06 (×2): qty 2

## 2018-08-06 MED ORDER — ONDANSETRON HCL 4 MG PO TABS: 4.0000 mg | ORAL_TABLET | Freq: Four times a day (QID) | ORAL | Status: DC | PRN

## 2018-08-06 MED ORDER — VITAMIN B-12 1000 MCG PO TABS
1000.0000 ug | ORAL_TABLET | Freq: Every day | ORAL | Status: DC
  Administered 2018-08-06: 21:00:00 1000 ug via ORAL
  Filled 2018-08-06: qty 1

## 2018-08-06 MED ORDER — DIPHENHYDRAMINE HCL 12.5 MG/5ML PO LIQD
12.5000 mg | Freq: Every day | ORAL | Status: DC | PRN
  Filled 2018-08-06: qty 10

## 2018-08-06 MED ORDER — SODIUM CHLORIDE 0.9 % IV SOLN
INTRAVENOUS | Status: DC
  Administered 2018-08-06: 18:00:00 via INTRAVENOUS

## 2018-08-06 MED ORDER — SODIUM CHLORIDE 0.9 % IV SOLN
3.0000 g | Freq: Four times a day (QID) | INTRAVENOUS | Status: DC
  Administered 2018-08-06 – 2018-08-07 (×3): 3 g via INTRAVENOUS
  Filled 2018-08-06 (×5): qty 3

## 2018-08-06 MED ORDER — SIMVASTATIN 40 MG PO TABS: 40.0000 mg | ORAL_TABLET | Freq: Every day | ORAL | Status: DC

## 2018-08-06 MED ORDER — MAGNESIUM OXIDE 400 (241.3 MG) MG PO TABS
800.0000 mg | ORAL_TABLET | Freq: Two times a day (BID) | ORAL | Status: DC
  Administered 2018-08-06: 800 mg via ORAL
  Filled 2018-08-06 (×3): qty 2

## 2018-08-06 MED ORDER — ALLOPURINOL 300 MG PO TABS: 300.0000 mg | ORAL_TABLET | Freq: Every morning | ORAL | Status: DC

## 2018-08-06 MED ORDER — LORAZEPAM 0.5 MG PO TABS
0.5000 mg | ORAL_TABLET | Freq: Four times a day (QID) | ORAL | Status: DC | PRN
  Administered 2018-08-06: 21:00:00 0.5 mg via ORAL
  Filled 2018-08-06: qty 1

## 2018-08-06 MED ORDER — INSULIN ASPART 100 UNIT/ML ~~LOC~~ SOLN: 0.0000 [IU] | Freq: Three times a day (TID) | SUBCUTANEOUS | Status: DC

## 2018-08-06 MED ORDER — ACETAMINOPHEN 500 MG PO TABS
1000.0000 mg | ORAL_TABLET | Freq: Once | ORAL | Status: AC
  Administered 2018-08-06: 1000 mg via ORAL
  Filled 2018-08-06: qty 2

## 2018-08-06 MED ORDER — SODIUM CHLORIDE 0.9 % IV BOLUS
1000.0000 mL | Freq: Once | INTRAVENOUS | Status: AC
  Administered 2018-08-06: 14:00:00 1000 mL via INTRAVENOUS

## 2018-08-06 MED ORDER — METHOCARBAMOL 500 MG PO TABS: 500.0000 mg | ORAL_TABLET | Freq: Four times a day (QID) | ORAL | Status: DC | PRN

## 2018-08-06 MED ORDER — ONDANSETRON HCL 4 MG/2ML IJ SOLN: 4.0000 mg | Freq: Four times a day (QID) | INTRAMUSCULAR | Status: DC | PRN

## 2018-08-06 MED ORDER — LORAZEPAM 0.5 MG PO TABS
0.5000 mg | ORAL_TABLET | Freq: Once | ORAL | Status: AC
  Administered 2018-08-06: 0.5 mg via ORAL
  Filled 2018-08-06: qty 1

## 2018-08-06 MED ORDER — SODIUM CHLORIDE 0.9 % IV SOLN
3.0000 g | Freq: Once | INTRAVENOUS | Status: AC
  Administered 2018-08-06: 3 g via INTRAVENOUS
  Filled 2018-08-06: qty 3

## 2018-08-06 MED ORDER — DIPHENHYDRAMINE HCL 25 MG PO CAPS: 50.0000 mg | ORAL_CAPSULE | Freq: Every evening | ORAL | Status: DC | PRN

## 2018-08-06 MED ORDER — ASPIRIN EC 81 MG PO TBEC
81.0000 mg | DELAYED_RELEASE_TABLET | Freq: Every day | ORAL | Status: DC
  Filled 2018-08-06: qty 1

## 2018-08-06 MED ORDER — URSODIOL 300 MG PO CAPS: 300.0000 mg | ORAL_CAPSULE | Freq: Four times a day (QID) | ORAL | Status: DC

## 2018-08-06 MED ORDER — LISINOPRIL 20 MG PO TABS: 40.0000 mg | ORAL_TABLET | Freq: Every day | ORAL | Status: DC

## 2018-08-06 MED ORDER — NITROGLYCERIN 0.4 MG SL SUBL: 0.4000 mg | SUBLINGUAL_TABLET | SUBLINGUAL | Status: DC | PRN

## 2018-08-06 NOTE — Consult Note (Signed)
Reason for Consult: Obstructive jaundice Referring Physician: [facility]  Samuel Coleman Him HPI: This is a 73 year old male who is well-known to me for a PMH of metastatic cholangiocarcinoma.  His diagnosis was made earlier this year with an ERCP when he started to have weight loss and a worsening of the findings on a repeat MRCP.  The Ca19-9 was also elevated.  A biliary stent was placed on 05/01/2018 and he was referred [facility].  Unfortunately, he was not a candidate for resection and chemotherapy was started.  He was scheduled to undergo a repeat ERCP with metallic stent placement next Friday, but two days ago he started to feel weak and dehydrated.  The patient also noted that he was jaundiced.  Blood work at [facility] ER showed that his liver enzymes were markedly elevated with an AP of 1300, TB at 5.0, and AST/ALT in the 400s.  There are no reports of any fever and his WBC was consistently low in the 2 range.  Repeat blood work today shows that his TB has markedly declined to 1.3 and his liver enzymes have also declined.  Past Medical History:  Diagnosis Date  . Anxiety   . Arthritis   . Asthma    exercise induced  . Back pain, chronic    gets injections in back  . CAD (coronary artery disease)    a.  prior inferior MI; s/p 3V CABG 01/01/10 (LIMA to LAD, SVG to OM, SVG to RCA- Dr.Bartle);  b. Last Myoview (05/2012):  Normal; EF 70% // c. Myoview 2/18: EF 60, no ischemia or infarction, Normal Study  . Carotid stenosis    Carotid US 03/2016 1-39% BICA  . Diabetes mellitus type 2 in nonobese (HCC)   . Frequent PVCs   . GERD (gastroesophageal reflux disease)   . Gout   . History of kidney stones    no problems with stones  . Hx of echocardiogram    Echo (01/2010):  EF 55-60%, ? Apical HK, mild BAE, atrial septal aneurysm.  Marland Kitchen Hyperlipidemia   . Hypertension   . Ischemic colitis ([place])    noted on CT back in August 2013 possible related to hypotension  . Pneumonia    x 2  .  Premature atrial contractions   . PUD (peptic ulcer disease) 1974  . ST elevation myocardial infarction (STEMI) of inferior wall ([place]) 2011   s/p CABG 2011  . Thoracic aortic aneurysm ([place])    a. last assessed 11/2017 3.6x3.3cm.  . 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 BILIARY STENT PLACEMENT N/A 04/21/2018   Procedure: BILIARY STENT PLACEMENT;  Surgeon: Carol Ada, MD;  Location: [facility];  Service: Endoscopy;  Laterality: N/A;  . BILIARY STENT PLACEMENT N/A 05/01/2018   Procedure: BILIARY STENT PLACEMENT;  Surgeon: Carol Ada, MD;  Location: [facility];  Service: Endoscopy;  Laterality: N/A;  . CARDIAC CATHETERIZATION    . CHEST TUBE INSERTION  1992   fx lt ribs-collapsed lung  . CORONARY ARTERY BYPASS GRAFT  11/11   X 3 VESSELS  . cyst removed from back of neck    . cyst removel on right buttocks - 2014    . EAR CYST EXCISION Left 09/02/2012   Procedure: EXCISION SEBACEOUS CYST LEFT POSTERIOR LEG;  Surgeon: Pedro Earls, MD;  Location: [facility];  Service: General;  Laterality: Left;  . ENDOSCOPIC RETROGRADE CHOLANGIOPANCREATOGRAPHY (ERCP)  WITH PROPOFOL N/A 04/21/2018   Procedure: ENDOSCOPIC RETROGRADE CHOLANGIOPANCREATOGRAPHY (ERCP) WITH PROPOFOL;  Surgeon: Carol Ada, MD;  [facility];  Service: Endoscopy;  Laterality: N/A;  . ENDOSCOPIC RETROGRADE CHOLANGIOPANCREATOGRAPHY (ERCP) WITH PROPOFOL N/A 05/01/2018   Procedure: ENDOSCOPIC RETROGRADE CHOLANGIOPANCREATOGRAPHY (ERCP) WITH PROPOFOL;  Surgeon: Carol Ada, MD;  [facility];  Service: Endoscopy;  Laterality: N/A;  . Excision of deep left neck mass. SURGEON:  Leonides Sake. Lucia Gaskins, M.D.  Cramer.Melena  . EYE SURGERY  both cararacts  . HERNIA REPAIR    . INGUINAL HERNIA REPAIR Right 12/02/2012   Procedure: OPEN RIGHT INGUINAL HERINA;  Surgeon: Pedro Earls, MD;  [facility]: [facility];  Service: General;   Laterality: Right;  . INGUINAL HERNIA REPAIR Left 11/02/2014   Procedure: OPEN LEFT INGUINAL HERNIA REPAIR WITH MESH;  Surgeon: Johnathan Hausen, MD;  [facility]: [facility];  Service: General;  Laterality: Left;  . INSERTION OF MESH Right 12/02/2012   Procedure: INSERTION OF MESH;  Surgeon: Pedro Earls, MD;  [facility];  Service: General;  Laterality: Right;  . LUMBAR LAMINECTOMY/DECOMPRESSION MICRODISCECTOMY N/A 01/26/2015   Procedure: Lumbar 5-sacrum 1 decompression;  Surgeon: Phylliss Bob, MD;  Location: [place];  Service: Orthopedics;  Laterality: N/A;  . SPHINCTEROTOMY  04/21/2018   Procedure: SPHINCTEROTOMY;  Surgeon: Carol Ada, MD;  Location[facility];  Service: Endoscopy;;  . Samuel Coleman  05/01/2018   Procedure: Samuel Coleman;  Surgeon: Carol Ada, MD;  Location[facility];  Service: Endoscopy;;  . Bess Kinds CHOLANGIOSCOPY N/A 05/01/2018   Procedure: ZOXWRUEA CHOLANGIOSCOPY;  Surgeon: Carol Ada, MD;  [facility];  Service: Endoscopy;  Laterality: N/A;  . STENT REMOVAL  05/01/2018   Procedure: STENT REMOVAL;  Surgeon: Carol Ada, MD;  [facility];  Service: Endoscopy;;  . TONSILLECTOMY      Family History  Problem Relation Age of Onset  . Heart failure Mother   . Pneumonia Father   . Lung cancer Sister   . Esophageal cancer Sister   . Pancreatic cancer Maternal Grandmother     Social History:  reports that he quit smoking about 44 years ago. He has never used smokeless tobacco. He reports current alcohol use. He reports that he does not use drugs.  Allergies:  Allergies  Allergen Reactions  . Clindamycin/Lincomycin Itching and Rash    All-over itching; required a course of Prednisone to treat  . Levofloxacin Nausea Only and Other (See Comments)    Insomnia    Medications:  Scheduled: . insulin aspart  0-9 Units Subcutaneous TID WC   Continuous:   Results for orders placed or performed during the hospital encounter of 08/06/18  (from the past 24 hour(s))  Lipase, blood     Status: Abnormal   Collection Time: 08/06/18  2:05 PM  Result Value Ref Range   Lipase 70 (H) 11 - 51 U/L  Comprehensive metabolic panel     Status: Abnormal   Collection Time: 08/06/18  2:05 PM  Result Value Ref Range   Sodium 132 (L) 135 - 145 mmol/L   Potassium 4.2 3.5 - 5.1 mmol/L   Chloride 97 (L) 98 - 111 mmol/L   CO2 23 22 - 32 mmol/L   Glucose, Bld 110 (H) 70 - 99 mg/dL   BUN 41 (H) 8 - 23 mg/dL   Creatinine, Ser 0.82 0.61 - 1.24 mg/dL   Calcium 8.9 8.9 - 10.3 mg/dL   Total Protein 6.1 (L) 6.5 - 8.1 g/dL   Albumin 2.8 (L) 3.5 -  5.0 g/dL   AST 79 (H) 15 - 41 U/L   ALT 228 (H) 0 - 44 U/L   Alkaline Phosphatase 885 (H) 38 - 126 U/L   Total Bilirubin 1.3 (H) 0.3 - 1.2 mg/dL   GFR calc non Af Amer >60 >60 mL/min   GFR calc Af Amer >60 >60 mL/min   Anion gap 12 5 - 15  CBC with Differential     Status: Abnormal   Collection Time: 08/06/18  2:05 PM  Result Value Ref Range   WBC 2.4 (L) 4.0 - 10.5 K/uL   RBC 2.87 (L) 4.22 - 5.81 MIL/uL   Hemoglobin 8.9 (L) 13.0 - 17.0 g/dL   HCT 28.2 (L) 39.0 - 52.0 %   MCV 98.3 80.0 - 100.0 fL   MCH 31.0 26.0 - 34.0 pg   MCHC 31.6 30.0 - 36.0 g/dL   RDW 13.7 11.5 - 15.5 %   Platelets 69 (L) 150 - 400 K/uL   nRBC 0.0 0.0 - 0.2 %   Neutrophils Relative % 78 %   Neutro Abs 1.8 1.7 - 7.7 K/uL   Lymphocytes Relative 17 %   Lymphs Abs 0.4 (L) 0.7 - 4.0 K/uL   Monocytes Relative 4 %   Monocytes Absolute 0.1 0.1 - 1.0 K/uL   Eosinophils Relative 0 %   Eosinophils Absolute 0.0 0.0 - 0.5 K/uL   Basophils Relative 0 %   Basophils Absolute 0.0 0.0 - 0.1 K/uL   Immature Granulocytes 1 %   Abs Immature Granulocytes 0.03 0.00 - 0.07 K/uL  Protime-INR     Status: None   Collection Time: 08/06/18  2:05 PM  Result Value Ref Range   Prothrombin Time 12.8 11.4 - 15.2 seconds   INR 1.0 0.8 - 1.2   *Note: Due to a large number of results and/or encounters for the requested time period, some results have  not been displayed. A complete set of results can be found in Results Review.     No results found.  ROS:  As stated above in the HPI otherwise negative.  Blood pressure (!) 147/77, pulse 98, temperature 97.9 F (36.6 C), temperature source Oral, resp. rate 19, height 5\' 9"  (1.753 m), weight 62.6 kg, SpO2 99 %.    PE: Gen: NAD, Alert and Oriented HEENT:  [place]/AT, EOMI Neck: Supple, no LAD Lungs: CTA Bilaterally CV: RRR without M/G/R ABM: Soft, NTND, +BS Ext: No C/C/E  Assessment/Plan: 1) Metastatic cholangiocarcinoma. 2) Abnormal liver enzymes. 3) Recent jaundice.   The recent blood work suggested a malfunctioning biliary stent versus progressive metastatic disease.  His liver enzymes have improved, but an ERCP with metallic stent placement will be pursued tomorrow.  Plan: 1) ERCP tomorrow AM at 8:45 AM.  Carol Ada D 08/06/2018, 4:13 PM

## 2018-08-06 NOTE — H&P (View-Only) (Signed)
Reason for Consult: Obstructive jaundice Referring Physician: [facility]  Samuel Coleman HPI: This is a 73 year old male who is well-known to me for a PMH of metastatic cholangiocarcinoma.  His diagnosis was made earlier this year with an ERCP when he started to have weight loss and a worsening of the findings on a repeat MRCP.  The Ca19-9 was also elevated.  A biliary stent was placed on 05/01/2018 and he was referred [facility].  Unfortunately, he was not a candidate for resection and chemotherapy was started.  He was scheduled to undergo a repeat ERCP with metallic stent placement next Friday, but two days ago he started to feel weak and dehydrated.  The patient also noted that he was jaundiced.  Blood work at [facility] ER showed that his liver enzymes were markedly elevated with an AP of 1300, TB at 5.0, and AST/ALT in the 400s.  There are no reports of any fever and his WBC was consistently low in the 2 range.  Repeat blood work today shows that his TB has markedly declined to 1.3 and his liver enzymes have also declined.  Past Medical History:  Diagnosis Date  . Anxiety   . Arthritis   . Asthma    exercise induced  . Back pain, chronic    gets injections in back  . CAD (coronary artery disease)    a.  prior inferior MI; s/p 3V CABG 01/01/10 (LIMA to LAD, SVG to OM, SVG to RCA- Dr.Bartle);  b. Last Myoview (05/2012):  Normal; EF 70% // c. Myoview 2/18: EF 60, no ischemia or infarction, Normal Study  . Carotid stenosis    Carotid US 03/2016 1-39% BICA  . Diabetes mellitus type 2 in nonobese (HCC)   . Frequent PVCs   . GERD (gastroesophageal reflux disease)   . Gout   . History of kidney stones    no problems with stones  . Hx of echocardiogram    Echo (01/2010):  EF 55-60%, ? Apical HK, mild BAE, atrial septal aneurysm.  Marland Kitchen Hyperlipidemia   . Hypertension   . Ischemic colitis ([place])    noted on CT back in August 2013 possible related to hypotension  . Pneumonia    x 2  .  Premature atrial contractions   . PUD (peptic ulcer disease) 1974  . ST elevation myocardial infarction (STEMI) of inferior wall ([place]) 2011   s/p CABG 2011  . Thoracic aortic aneurysm ([place])    a. last assessed 11/2017 3.6x3.3cm.  . 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 BILIARY STENT PLACEMENT N/A 04/21/2018   Procedure: BILIARY STENT PLACEMENT;  Surgeon: Carol Ada, MD;  [facility];  Service: Endoscopy;  Laterality: N/A;  . BILIARY STENT PLACEMENT N/A 05/01/2018   Procedure: BILIARY STENT PLACEMENT;  Surgeon: Carol Ada, MD;  [facility];  Service: Endoscopy;  Laterality: N/A;  . CARDIAC CATHETERIZATION    . CHEST TUBE INSERTION  1992   fx lt ribs-collapsed lung  . CORONARY ARTERY BYPASS GRAFT  11/11   X 3 VESSELS  . cyst removed from back of neck    . cyst removel on right buttocks - 2014    . EAR CYST EXCISION Left 09/02/2012   Procedure: EXCISION SEBACEOUS CYST LEFT POSTERIOR LEG;  Surgeon: Pedro Earls, MD;  Location: [place];  Service: General;  Laterality: Left;  . ENDOSCOPIC RETROGRADE CHOLANGIOPANCREATOGRAPHY (ERCP)  WITH PROPOFOL N/A 04/21/2018   Procedure: ENDOSCOPIC RETROGRADE CHOLANGIOPANCREATOGRAPHY (ERCP) WITH PROPOFOL;  Surgeon: Carol Ada, MD;  [facility];  Service: Endoscopy;  Laterality: N/A;  . ENDOSCOPIC RETROGRADE CHOLANGIOPANCREATOGRAPHY (ERCP) WITH PROPOFOL N/A 05/01/2018   Procedure: ENDOSCOPIC RETROGRADE CHOLANGIOPANCREATOGRAPHY (ERCP) WITH PROPOFOL;  Surgeon: Carol Ada, MD;  [facility];  Service: Endoscopy;  Laterality: N/A;  . Excision of deep left neck mass. SURGEON:  Leonides Sake. Lucia Gaskins, M.D.  Cramer.Melena  . EYE SURGERY  both cararacts  . HERNIA REPAIR    . INGUINAL HERNIA REPAIR Right 12/02/2012   Procedure: OPEN RIGHT INGUINAL HERINA;  Surgeon: Pedro Earls, MD;  [facility];  Service: General;   Laterality: Right;  . INGUINAL HERNIA REPAIR Left 11/02/2014   Procedure: OPEN LEFT INGUINAL HERNIA REPAIR WITH MESH;  Surgeon: Johnathan Hausen, MD;  [facility];  Service: General;  Laterality: Left;  . INSERTION OF MESH Right 12/02/2012   Procedure: INSERTION OF MESH;  Surgeon: Pedro Earls, MD;  [facility];  Service: General;  Laterality: Right;  . LUMBAR LAMINECTOMY/DECOMPRESSION MICRODISCECTOMY N/A 01/26/2015   Procedure: Lumbar 5-sacrum 1 decompression;  Surgeon: Phylliss Bob, MD;  Location: [place];  Service: Orthopedics;  Laterality: N/A;  . SPHINCTEROTOMY  04/21/2018   Procedure: SPHINCTEROTOMY;  Surgeon: Carol Ada, MD;  [facility];  Service: Endoscopy;;  . Joan Mayans  05/01/2018   Procedure: Joan Mayans;  Surgeon: Carol Ada, MD;  [facility];  Service: Endoscopy;;  . Bess Kinds CHOLANGIOSCOPY N/A 05/01/2018   Procedure: IOEVOJJK CHOLANGIOSCOPY;  Surgeon: Carol Ada, MD;  [facility];  Service: Endoscopy;  Laterality: N/A;  . STENT REMOVAL  05/01/2018   Procedure: STENT REMOVAL;  Surgeon: Carol Ada, MD;  [facility];  Service: Endoscopy;;  . TONSILLECTOMY      Family History  Problem Relation Age of Onset  . Heart failure Mother   . Pneumonia Father   . Lung cancer Sister   . Esophageal cancer Sister   . Pancreatic cancer Maternal Grandmother     Social History:  reports that he quit smoking about 44 years ago. He has never used smokeless tobacco. He reports current alcohol use. He reports that he does not use drugs.  Allergies:  Allergies  Allergen Reactions  . Clindamycin/Lincomycin Itching and Rash    All-over itching; required a course of Prednisone to treat  . Levofloxacin Nausea Only and Other (See Comments)    Insomnia    Medications:  Scheduled: . insulin aspart  0-9 Units Subcutaneous TID WC   Continuous:   Results for orders placed or performed during the hospital encounter of 08/06/18  (from the past 24 hour(s))  Lipase, blood     Status: Abnormal   Collection Time: 08/06/18  2:05 PM  Result Value Ref Range   Lipase 70 (H) 11 - 51 U/L  Comprehensive metabolic panel     Status: Abnormal   Collection Time: 08/06/18  2:05 PM  Result Value Ref Range   Sodium 132 (L) 135 - 145 mmol/L   Potassium 4.2 3.5 - 5.1 mmol/L   Chloride 97 (L) 98 - 111 mmol/L   CO2 23 22 - 32 mmol/L   Glucose, Bld 110 (H) 70 - 99 mg/dL   BUN 41 (H) 8 - 23 mg/dL   Creatinine, Ser 0.82 0.61 - 1.24 mg/dL   Calcium 8.9 8.9 - 10.3 mg/dL   Total Protein 6.1 (L) 6.5 - 8.1 g/dL   Albumin 2.8 (L) 3.5 -  5.0 g/dL   AST 79 (H) 15 - 41 U/L   ALT 228 (H) 0 - 44 U/L   Alkaline Phosphatase 885 (H) 38 - 126 U/L   Total Bilirubin 1.3 (H) 0.3 - 1.2 mg/dL   GFR calc non Af Amer >60 >60 mL/min   GFR calc Af Amer >60 >60 mL/min   Anion gap 12 5 - 15  CBC with Differential     Status: Abnormal   Collection Time: 08/06/18  2:05 PM  Result Value Ref Range   WBC 2.4 (L) 4.0 - 10.5 K/uL   RBC 2.87 (L) 4.22 - 5.81 MIL/uL   Hemoglobin 8.9 (L) 13.0 - 17.0 g/dL   HCT 28.2 (L) 39.0 - 52.0 %   MCV 98.3 80.0 - 100.0 fL   MCH 31.0 26.0 - 34.0 pg   MCHC 31.6 30.0 - 36.0 g/dL   RDW 13.7 11.5 - 15.5 %   Platelets 69 (L) 150 - 400 K/uL   nRBC 0.0 0.0 - 0.2 %   Neutrophils Relative % 78 %   Neutro Abs 1.8 1.7 - 7.7 K/uL   Lymphocytes Relative 17 %   Lymphs Abs 0.4 (L) 0.7 - 4.0 K/uL   Monocytes Relative 4 %   Monocytes Absolute 0.1 0.1 - 1.0 K/uL   Eosinophils Relative 0 %   Eosinophils Absolute 0.0 0.0 - 0.5 K/uL   Basophils Relative 0 %   Basophils Absolute 0.0 0.0 - 0.1 K/uL   Immature Granulocytes 1 %   Abs Immature Granulocytes 0.03 0.00 - 0.07 K/uL  Protime-INR     Status: None   Collection Time: 08/06/18  2:05 PM  Result Value Ref Range   Prothrombin Time 12.8 11.4 - 15.2 seconds   INR 1.0 0.8 - 1.2   *Note: Due to a large number of results and/or encounters for the requested time period, some results have  not been displayed. A complete set of results can be found in Results Review.     No results found.  ROS:  As stated above in the HPI otherwise negative.  Blood pressure (!) 147/77, pulse 98, temperature 97.9 F (36.6 C), temperature source Oral, resp. rate 19, height 5\' 9"  (1.753 m), weight 62.6 kg, SpO2 99 %.    PE: Gen: NAD, Alert and Oriented HEENT:  [place]/AT, EOMI Neck: Supple, no LAD Lungs: CTA Bilaterally CV: RRR without M/G/R ABM: Soft, NTND, +BS Ext: No C/C/E  Assessment/Plan: 1) Metastatic cholangiocarcinoma. 2) Abnormal liver enzymes. 3) Recent jaundice.   The recent blood work suggested a malfunctioning biliary stent versus progressive metastatic disease.  His liver enzymes have improved, but an ERCP with metallic stent placement will be pursued tomorrow.  Plan: 1) ERCP tomorrow AM at 8:45 AM.  Carol Ada D 08/06/2018, 4:13 PM

## 2018-08-06 NOTE — H&P (Signed)
History and Physical    Samuel Coleman:[id number] DOB: 1945/10/21 DOA: 08/06/2018  Referring MD/NP/PA: EDP PCP:  Patient coming from: Home  Chief Complaint: Abnormal labs  HPI: Samuel Coleman is a 73 y.o. male with medical history significant for primary sclerosing cholangitis, with biliary stent placed in 04/2018, subsequently later this year was diagnosed with cholangiocarcinoma metastatic to the liver, he was originally seen by oncology at [facility] and subsequently now follows with oncology at [facility] where he is getting chemotherapy with Gemzar and cisplatin, completed 3 cycles, last cycle of chemotherapy was on 6/3. -He was sent to the emergency room by Dr. Benson Norway today due to worsening LFTs, alkaline phosphatase concerning for obstructed stent possibly and stent exchange ED Course: Afebrile, vital signs stable, labs notable for mild hyponatremia, elevated alkaline phosphatase of 885, elevated AST ALT and total bili of 1.3, anemia hemoglobin of 8.9 and platelets of 69K  Review of Systems: As per HPI otherwise 14 point review of systems negative.   Past Medical History:  Diagnosis Date  . Anxiety   . Arthritis   . Asthma    exercise induced  . Back pain, chronic    gets injections in back  . CAD (coronary artery disease)    a.  prior inferior MI; s/p 3V CABG 01/01/10 (LIMA to LAD, SVG to OM, SVG to RCA- Dr.Bartle);  b. Last Myoview (05/2012):  Normal; EF 70% // c. Myoview 2/18: EF 60, no ischemia or infarction, Normal Study  . Carotid stenosis    Carotid US 03/2016 1-39% BICA  . Diabetes mellitus type 2 in nonobese (HCC)   . Frequent PVCs   . GERD (gastroesophageal reflux disease)   . Gout   . History of kidney stones    no problems with stones  . Hx of echocardiogram    Echo (01/2010):  EF 55-60%, ? Apical HK, mild BAE, atrial septal aneurysm.  Marland Kitchen Hyperlipidemia   . Hypertension   . Ischemic colitis ([place])    noted on CT back in August 2013 possible related to  hypotension  . Pneumonia    x 2  . Premature atrial contractions   . PUD (peptic ulcer disease) 1974  . ST elevation myocardial infarction (STEMI) of inferior wall ([place]) 2011   s/p CABG 2011  . Thoracic aortic aneurysm ([place])    a. last assessed 11/2017 3.6x3.3cm.  . 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 BILIARY STENT PLACEMENT N/A 04/21/2018   Procedure: BILIARY STENT PLACEMENT;  Surgeon: Carol Ada, MD;  [facility];  Service: Endoscopy;  Laterality: N/A;  . BILIARY STENT PLACEMENT N/A 05/01/2018   Procedure: BILIARY STENT PLACEMENT;  Surgeon: Carol Ada, MD;  [facility];  Service: Endoscopy;  Laterality: N/A;  . CARDIAC CATHETERIZATION    . CHEST TUBE INSERTION  1992   fx lt ribs-collapsed lung  . CORONARY ARTERY BYPASS GRAFT  11/11   X 3 VESSELS  . cyst removed from back of neck    . cyst removel on right buttocks - 2014    . EAR CYST EXCISION Left 09/02/2012   Procedure: EXCISION SEBACEOUS CYST LEFT POSTERIOR LEG;  Surgeon: Pedro Earls, MD;  Location: [place];  Service: General;  Laterality: Left;  . ENDOSCOPIC RETROGRADE CHOLANGIOPANCREATOGRAPHY (ERCP) WITH PROPOFOL N/A 04/21/2018   Procedure: ENDOSCOPIC RETROGRADE CHOLANGIOPANCREATOGRAPHY (ERCP) WITH PROPOFOL;  Surgeon: Carol Ada, MD;  [facility];  Service: Endoscopy;  Laterality: N/A;  . ENDOSCOPIC RETROGRADE CHOLANGIOPANCREATOGRAPHY (ERCP) WITH PROPOFOL N/A 05/01/2018   Procedure: ENDOSCOPIC RETROGRADE CHOLANGIOPANCREATOGRAPHY (ERCP) WITH PROPOFOL;  Surgeon: Carol Ada, MD;  [facility];  Service: Endoscopy;  Laterality: N/A;  . Excision of deep left neck mass. SURGEON:  Leonides Sake. Lucia Gaskins, M.D.  Cramer.Melena  . EYE SURGERY  both cararacts  . HERNIA REPAIR    . INGUINAL HERNIA REPAIR Right 12/02/2012   Procedure: OPEN RIGHT INGUINAL HERINA;  Surgeon: Pedro Earls, MD;   [facility];  Service: General;  Laterality: Right;  . INGUINAL HERNIA REPAIR Left 11/02/2014   Procedure: OPEN LEFT INGUINAL HERNIA REPAIR WITH MESH;  Surgeon: Johnathan Hausen, MD;  [facility];  Service: General;  Laterality: Left;  . INSERTION OF MESH Right 12/02/2012   Procedure: INSERTION OF MESH;  Surgeon: Pedro Earls, MD;  [facility];  Service: General;  Laterality: Right;  . LUMBAR LAMINECTOMY/DECOMPRESSION MICRODISCECTOMY N/A 01/26/2015   Procedure: Lumbar 5-sacrum 1 decompression;  Surgeon: Phylliss Bob, MD;  Location: [place];  Service: Orthopedics;  Laterality: N/A;  . SPHINCTEROTOMY  04/21/2018   Procedure: SPHINCTEROTOMY;  Surgeon: Carol Ada, MD;  [facility];  Service: Endoscopy;;  . Joan Mayans  05/01/2018   Procedure: Joan Mayans;  Surgeon: Carol Ada, MD;  [facility];  Service: Endoscopy;;  . Bess Kinds CHOLANGIOSCOPY N/A 05/01/2018   Procedure: GMWNUUVO CHOLANGIOSCOPY;  Surgeon: Carol Ada, MD;  [facility];  Service: Endoscopy;  Laterality: N/A;  . STENT REMOVAL  05/01/2018   Procedure: STENT REMOVAL;  Surgeon: Carol Ada, MD;  [facility];  Service: Endoscopy;;  . TONSILLECTOMY       reports that he quit smoking about 44 years ago. He has never used smokeless tobacco. He reports current alcohol use. He reports that he does not use drugs.  Allergies  Allergen Reactions  . Clindamycin/Lincomycin Itching and Rash    All-over itching; required a course of Prednisone to treat  . Levofloxacin Nausea Only and Other (See Comments)    Insomnia    Family History  Problem Relation Age of Onset  . Heart failure Mother   . Pneumonia Father   . Lung cancer Sister   . Esophageal cancer Sister   . Pancreatic cancer Maternal Grandmother      Prior to Admission medications   Medication Sig Start Date End Date Taking? Authorizing Provider  acetaminophen (TYLENOL) 500 MG tablet Take 1,000 mg by mouth daily  as needed for moderate pain or headache.    [provider]  allopurinol (ZYLOPRIM) 300 MG tablet Take 300 mg by mouth every morning.     [provider]  Ascorbic Acid (VITAMIN C) 1000 MG tablet Take 1,000 mg by mouth every morning.     [provider]  aspirin 81 MG tablet Take 81 mg by mouth daily.    [provider]  camphor-phenol (CAMPHO-PHENIQUE) 10.8-4.7 % LIQD Apply 1 application topically daily as needed (fever blisters).    [provider]  cholecalciferol (VITAMIN D3) 25 MCG (1000 UT) tablet Take 1,000 Units by mouth daily.    [provider]  clotrimazole (LOTRIMIN) 1 % cream Apply 1 application topically daily as needed (athlete's foot).    [provider]  clotrimazole (MYCELEX) 10 MG troche Take 10 mg by mouth 5 (five) times daily.    [provider]  clotrimazole-betamethasone (LOTRISONE) cream Apply 1 application topically 2 (two) times daily. Apply to both feet 03/21/16  [provider]  Digestive Enzyme CAPS Take 2 capsules by mouth 3 (three) times daily with meals.    [provider]  diphenhydrAMINE (BENADRYL) 12.5 MG/5ML liquid Take 12.5-25 mg by mouth daily as needed for allergies.    [provider]  diphenhydrAMINE (BENADRYL) 50 MG capsule Take 50 mg by mouth at bedtime as needed for sleep.    [provider]  hydrocortisone cream 1 % Apply 1 application topically as needed (for itching on ears or feet).     [provider]  lisinopril (PRINIVIL,ZESTRIL) 40 MG tablet Take 1 tablet (40 mg total) by mouth daily. 12/25/17   Dunn, Nedra Hai, PA-C  Magnesium Oxide 400 (240 Mg) MG TABS Take 2 tablets (800 mg total) by mouth 2 (two) times daily. 03/13/18   [name], MD  metFORMIN (GLUCOPHAGE) 1000 MG tablet Take 1,000 mg by mouth 2 (two) times daily with a meal. 12/25/17   [provider]  methocarbamol (ROBAXIN) 500 MG tablet Take 500 mg by  mouth 4 (four) times daily as needed for muscle spasms.     [provider]  metoprolol tartrate (LOPRESSOR) 50 MG tablet TAKE 1 TABLET BY MOUTH TWICE A DAY 06/01/18   [name], MD  Multiple Vitamin (MULTIVITAMIN WITH MINERALS) TABS tablet Take 1 tablet by mouth every morning.     [provider]  mupirocin ointment (BACTROBAN) 2 % Place 1 application into the nose daily as needed (wound care).    [provider]  mycophenolate (CELLCEPT) 500 MG tablet Take 500 mg by mouth 2 (two) times daily.  04/07/18   [provider]  neomycin-bacitracin-polymyxin (NEOSPORIN) [phone] ointment Apply 1 application topically 2 (two) times daily as needed (FOR FOOT IRRITATION).     [provider]  nitroGLYCERIN (NITROSTAT) 0.4 MG SL tablet Place 1 tablet (0.4 mg total) under the tongue every 5 (five) minutes as needed for chest pain. 11/26/16   Daune Perch, NP  Polyvinyl Alcohol-Povidone (REFRESH OP) Place 1 drop into both eyes daily as needed (dry eyes).    [provider]  predniSONE (DELTASONE) 1 MG tablet Take 4 mg by mouth daily with breakfast.    [provider]  simvastatin (ZOCOR) 40 MG tablet Take 40 mg by mouth daily.     [provider]  sitaGLIPtin (JANUVIA) 100 MG tablet Take 100 mg by mouth daily.    [provider]  ursodiol (ACTIGALL) 300 MG capsule Take 300 mg by mouth 4 (four) times daily.  11/07/17   [provider]  vitamin B-12 (CYANOCOBALAMIN) 1000 MCG tablet Take 1,000 mcg by mouth daily.    [provider]  vitamin E 400 UNIT capsule Take 400 Units by mouth daily.    [provider]    Physical Exam: Vitals:   08/06/18 1209 08/06/18 1210 08/06/18 1500 08/06/18 1527  BP:  129/72 108/69 (!) 147/77  Pulse:  (!) 107 86 98  Resp:  15 (!) 22 19  Temp:  98.1 F (36.7 C) 97.9 F (36.6 C)   TempSrc:  Oral Oral   SpO2:  99% 100% 99%  Weight: 62.6 kg     Height: 5\' 9"   (1.753 m)         Constitutional: Thinly built chronically ill-appearing male, sitting up in bed, no distress, AAO x3, very pleasant Vitals:   08/06/18 1209 08/06/18 1210 08/06/18 1500 08/06/18 1527  BP:  129/72 108/69 (!) 147/77  Pulse:  (!) 107 86  98  Resp:  15 (!) 22 19  Temp:  98.1 F (36.7 C) 97.9 F (36.6 C)   TempSrc:  Oral Oral   SpO2:  99% 100% 99%  Weight: 62.6 kg     Height: 5\' 9"  (1.753 m)      Eyes: PERRL, lids and conjunctivae normal, minimal icterus ENMT: Mucous membranes are moist.  Neck: normal, supple, no JVD, right upper chest Port-A-Cath noted Respiratory: Clear bilaterally Cardiovascular: S1-S2/regular rhythm, tachycardic Abdomen: soft, non tender, Bowel sounds positive.  Musculoskeletal: No joint deformity upper and lower extremities. Ext: No edema Skin: no rashes, lesions, ulcers.  Neurologic: Moves all extremities, no localizing signs Psychiatric: Normal judgment and insight. Alert and oriented x 3. Normal mood.   Labs on Admission: I have personally reviewed following labs and imaging studies  CBC: Recent Labs  Lab 08/06/18 1405  WBC 2.4*  NEUTROABS 1.8  HGB 8.9*  HCT 28.2*  MCV 98.3  PLT 69*   Basic Metabolic Panel: Recent Labs  Lab 08/06/18 1405  NA 132*  K 4.2  CL 97*  CO2 23  GLUCOSE 110*  BUN 41*  CREATININE 0.82  CALCIUM 8.9   GFR: Estimated Creatinine Clearance: 70 mL/min (by C-G formula based on SCr of 0.82 mg/dL). Liver Function Tests: Recent Labs  Lab 08/06/18 1405  AST 79*  ALT 228*  ALKPHOS 885*  BILITOT 1.3*  PROT 6.1*  ALBUMIN 2.8*   Recent Labs  Lab 08/06/18 1405  LIPASE 70*   No results for input(s): AMMONIA in the last 168 hours. Coagulation Profile: Recent Labs  Lab 08/06/18 1405  INR 1.0   Cardiac Enzymes: No results for input(s): CKTOTAL, CKMB, CKMBINDEX, TROPONINI in the last 168 hours. BNP (last 3 results) No results for input(s): PROBNP in the last 8760 hours. HbA1C: No results for  input(s): HGBA1C in the last 72 hours. CBG: No results for input(s): GLUCAP in the last 168 hours. Lipid Profile: No results for input(s): CHOL, HDL, LDLCALC, TRIG, CHOLHDL, LDLDIRECT in the last 72 hours. Thyroid Function Tests: No results for input(s): TSH, T4TOTAL, FREET4, T3FREE, THYROIDAB in the last 72 hours. Anemia Panel: No results for input(s): VITAMINB12, FOLATE, FERRITIN, TIBC, IRON, RETICCTPCT in the last 72 hours. Urine analysis:    Component Value Date/Time   COLORURINE YELLOW 08/11/2017 [place] 08/11/2017 1045   LABSPEC 1.020 08/11/2017 1045   PHURINE 5.5 08/11/2017 1045   GLUCOSEU NEGATIVE 08/11/2017 1045   HGBUR NEGATIVE 08/11/2017 [place] 08/11/2017 1045   KETONESUR NEGATIVE 08/11/2017 1045   PROTEINUR NEGATIVE 08/11/2017 1045   UROBILINOGEN 1.0 12/07/2011 0851   NITRITE NEGATIVE 08/11/2017 1045   LEUKOCYTESUR NEGATIVE 08/11/2017 1045   Sepsis Labs: @LABRCNTIP (procalcitonin:4,lacticidven:4) )No results found for this or any previous visit (from the past 240 hour(s)).   Radiological Exams on Admission: No results found.  EKG: Independently reviewed.  Normal sinus rhythm, nonspecific ST-T wave changes in inferior leads  Assessment/Plan  Obstructive jaundice, known primary sclerosing cholangitis and cholangiocarcinoma -With prior biliary stent from 04/2018 -Concern for at least partial obstruction of the stent, unable to rule out cholangitis however given immunocompromised state from metastatic cancer and chemotherapy, will start IV Unasyn today, gentle IV fluids -GI Dr. Benson Norway consulted, plan for ERCP with stent exchange tomorrow   Primary sclerosing cholangitis -As above  Cholangiocarcinoma metastatic to the liver -Followed by oncology at [facility] Dr. Annitta Jersey -On chemotherapy, last chemo 6/3, continue low-dose prednisone  History of CAD/CABG/hypertension -Blood pressure is  stable, continue beta-blocker, statin  History  of Wegener's granulomatosis -Was on CellCept for approximately 9 months which was discontinued in March when he started chemotherapy -Currently on surveillance only  DVT prophylaxis: SCDs, pending ERCP stent Code Status: DNR Family Communication: Discussed extensively with patient in detail Disposition Plan: Home when cleared by GI Consults called: Dr.Hung GI Admission status: inpatient  Domenic Polite MD [facility]  08/06/2018, 4:10 PM

## 2018-08-06 NOTE — Progress Notes (Signed)
A consult was received from an ED physician for unasyn per pharmacy dosing.  The patient's profile has been reviewed for ht/wt/allergies/indication/available labs.    A one time order has been placed for unasyn 3gm IV x1.  Further antibiotics/pharmacy consults should be ordered by admitting physician if indicated.           Of note, no drug-drug intxns noted between gemcitabine, cisplatin and unasyn.              Thank you, Lynelle Doctor 08/06/2018  2:52 PM

## 2018-08-06 NOTE — ED Notes (Signed)
ED TO INPATIENT HANDOFF REPORT  ED Nurse Name and Phone #: [phone]  S Name/Age/Gender Samuel Coleman 73 y.o. male Room/Bed: WA20/WA20  Code Status   Code Status: Prior  Home/SNF/Other Home Patient oriented to: self, place, time and situation Is this baseline? Yes   Triage Complete: Triage complete  Chief Complaint abnormal labs   Triage Note Patient states he was sent over by doctor for abnormal labs. Liver enzymes elevated.   Patient suppose to have surgery next week but surgeon wants to have surgery tomorrow for Bile duct stent   Total Bilirubin 5.0 Alkaline Phosphatase-1305 AST-311 ALT-407   A/Ox4 Wheelchair in triage.    Allergies Allergies  Allergen Reactions  . Clindamycin/Lincomycin Itching and Rash    All-over itching; required a course of Prednisone to treat  . Levofloxacin Nausea Only and Other (See Comments)    Insomnia  . Fish Oil     Other reaction(s): Other (See Comments) Stomach upset   . Nsaids     Other reaction(s): Other (See Comments) Gi upset    Level of Care/Admitting Diagnosis ED Disposition    ED Disposition Condition Comment   Admit  Hospital Area: [facility] [[id number]]  Level of Care: Med-Surg [16]  Covid Evaluation: Screening Protocol (No Symptoms)  Diagnosis: PSC (primary sclerosing cholangitis) [[id number]]  Admitting Physician: Domenic Polite [[id number]]  Attending Physician: Domenic Polite [[id number]]  Estimated length of stay: past midnight tomorrow  Certification:: I certify this patient will need inpatient services for at least 2 midnights  PT Class (Do Not Modify): Inpatient [101]  PT Acc Code (Do Not Modify): Private [1]       B Medical/Surgery History Past Medical History:  Diagnosis Date  . Anxiety   . Arthritis   . Asthma    exercise induced  . Back pain, chronic    gets injections in back  . CAD (coronary artery disease)    a.  prior inferior MI; s/p 3V CABG 01/01/10 (LIMA to LAD,  SVG to OM, SVG to RCA- Dr.Bartle);  b. Last Myoview (05/2012):  Normal; EF 70% // c. Myoview 2/18: EF 60, no ischemia or infarction, Normal Study  . Carotid stenosis    Carotid US 03/2016 1-39% BICA  . Diabetes mellitus type 2 in nonobese (HCC)   . Frequent PVCs   . GERD (gastroesophageal reflux disease)   . Gout   . History of kidney stones    no problems with stones  . Hx of echocardiogram    Echo (01/2010):  EF 55-60%, ? Apical HK, mild BAE, atrial septal aneurysm.  Marland Kitchen Hyperlipidemia   . Hypertension   . Ischemic colitis ([place])    noted on CT back in August 2013 possible related to hypotension  . Pneumonia    x 2  . Premature atrial contractions   . PUD (peptic ulcer disease) 1974  . ST elevation myocardial infarction (STEMI) of inferior wall ([place]) 2011   s/p CABG 2011  . Thoracic aortic aneurysm ([place])    a. last assessed 11/2017 3.6x3.3cm.  . 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 BILIARY STENT PLACEMENT N/A 04/21/2018   Procedure: BILIARY STENT PLACEMENT;  Surgeon: Carol Ada, MD;  [facility];  Service: Endoscopy;  Laterality: N/A;  . BILIARY STENT PLACEMENT N/A 05/01/2018   Procedure: BILIARY STENT PLACEMENT;  Surgeon: Carol Ada, MD;  [facility];  Service: Endoscopy;  Laterality: N/A;  .  CARDIAC CATHETERIZATION    . CHEST TUBE INSERTION  1992   fx lt ribs-collapsed lung  . CORONARY ARTERY BYPASS GRAFT  11/11   X 3 VESSELS  . cyst removed from back of neck    . cyst removel on right buttocks - 2014    . EAR CYST EXCISION Left 09/02/2012   Procedure: EXCISION SEBACEOUS CYST LEFT POSTERIOR LEG;  Surgeon: Pedro Earls, MD;  Location: [place];  Service: General;  Laterality: Left;  . ENDOSCOPIC RETROGRADE CHOLANGIOPANCREATOGRAPHY (ERCP) WITH PROPOFOL N/A 04/21/2018   Procedure: ENDOSCOPIC RETROGRADE CHOLANGIOPANCREATOGRAPHY (ERCP) WITH PROPOFOL;   Surgeon: Carol Ada, MD;  [facility];  Service: Endoscopy;  Laterality: N/A;  . ENDOSCOPIC RETROGRADE CHOLANGIOPANCREATOGRAPHY (ERCP) WITH PROPOFOL N/A 05/01/2018   Procedure: ENDOSCOPIC RETROGRADE CHOLANGIOPANCREATOGRAPHY (ERCP) WITH PROPOFOL;  Surgeon: Carol Ada, MD;  [facility];  Service: Endoscopy;  Laterality: N/A;  . Excision of deep left neck mass. SURGEON:  Leonides Sake. Lucia Gaskins, M.D.  Cramer.Melena  . EYE SURGERY  both cararacts  . HERNIA REPAIR    . INGUINAL HERNIA REPAIR Right 12/02/2012   Procedure: OPEN RIGHT INGUINAL HERINA;  Surgeon: Pedro Earls, MD;  [facility];  Service: General;  Laterality: Right;  . INGUINAL HERNIA REPAIR Left 11/02/2014   Procedure: OPEN LEFT INGUINAL HERNIA REPAIR WITH MESH;  Surgeon: Johnathan Hausen, MD;  [facility];  Service: General;  Laterality: Left;  . INSERTION OF MESH Right 12/02/2012   Procedure: INSERTION OF MESH;  Surgeon: Pedro Earls, MD;  [facility];  Service: General;  Laterality: Right;  . LUMBAR LAMINECTOMY/DECOMPRESSION MICRODISCECTOMY N/A 01/26/2015   Procedure: Lumbar 5-sacrum 1 decompression;  Surgeon: Phylliss Bob, MD;  Location: [place];  Service: Orthopedics;  Laterality: N/A;  . SPHINCTEROTOMY  04/21/2018   Procedure: SPHINCTEROTOMY;  Surgeon: Carol Ada, MD;  [facility];  Service: Endoscopy;;  . Joan Mayans  05/01/2018   Procedure: Joan Mayans;  Surgeon: Carol Ada, MD;  Location[facility];  Service: Endoscopy;;  . Bess Kinds CHOLANGIOSCOPY N/A 05/01/2018   Procedure: RKYHCWCB CHOLANGIOSCOPY;  Surgeon: Carol Ada, MD;  [facility];  Service: Endoscopy;  Laterality: N/A;  . STENT REMOVAL  05/01/2018   Procedure: STENT REMOVAL;  Surgeon: Carol Ada, MD;  [facility];  Service: Endoscopy;;  . TONSILLECTOMY       A IV Location/Drains/Wounds Patient Lines/Drains/Airways Status   Active Line/Drains/Airways    Name:   Placement date:   Placement  time:   Site:   Days:   Implanted Port 08/06/18   08/06/18    1413    -   less than 1   GI Stent 7 Fr.   05/01/18    1305    -   97          Intake/Output Last 24 hours  Intake/Output Summary (Last 24 hours) at 08/06/2018 1634 Last data filed at 08/06/2018 1528 Gross per 24 hour  Intake 1000 ml  Output -  Net 1000 ml    Labs/Imaging Results for orders placed or performed during the hospital encounter of 08/06/18 (from the past 48 hour(s))  SARS Coronavirus 2 (CEPHEID - Performed in [place] hospital lab), Hosp Order     Status: None   Collection Time: 08/06/18  1:46 PM   Specimen: Nasopharyngeal Swab  Result Value Ref Range   SARS Coronavirus 2 NEGATIVE NEGATIVE    Comment: (NOTE) If result is NEGATIVE SARS-CoV-2 target nucleic acids are NOT DETECTED. The SARS-CoV-2  RNA is generally detectable in upper and lower  respiratory specimens during the acute phase of infection. The lowest  concentration of SARS-CoV-2 viral copies this assay can detect is 250  copies / mL. A negative result does not preclude SARS-CoV-2 infection  and should not be used as the sole basis for treatment or other  patient management decisions.  A negative result may occur with  improper specimen collection / handling, submission of specimen other  than nasopharyngeal swab, presence of viral mutation(s) within the  areas targeted by this assay, and inadequate number of viral copies  (<250 copies / mL). A negative result must be combined with clinical  observations, patient history, and epidemiological information. If result is POSITIVE SARS-CoV-2 target nucleic acids are DETECTED. The SARS-CoV-2 RNA is generally detectable in upper and lower  respiratory specimens dur ing the acute phase of infection.  Positive  results are indicative of active infection with SARS-CoV-2.  Clinical  correlation with patient history and other diagnostic information is  necessary to determine patient infection status.   Positive results do  not rule out bacterial infection or co-infection with other viruses. If result is PRESUMPTIVE POSTIVE SARS-CoV-2 nucleic acids MAY BE PRESENT.   A presumptive positive result was obtained on the submitted specimen  and confirmed on repeat testing.  While 2019 novel coronavirus  (SARS-CoV-2) nucleic acids may be present in the submitted sample  additional confirmatory testing may be necessary for epidemiological  and / or clinical management purposes  to differentiate between  SARS-CoV-2 and other Sarbecovirus currently known to infect humans.  If clinically indicated additional testing with an alternate test  methodology [phone]) is advised. The SARS-CoV-2 RNA is generally  detectable in upper and lower respiratory sp ecimens during the acute  phase of infection. The expected result is Negative. Fact Sheet for Patients:  StrictlyIdeas.no Fact Sheet for Healthcare Providers: BankingDealers.co.za This test is not yet approved or cleared by the [place] FDA and has been authorized for detection and/or diagnosis of SARS-CoV-2 by FDA under an Emergency Use Authorization (EUA).  This EUA will remain in effect (meaning this test can be used) for the duration of the COVID-19 declaration under Section 564(b)(1) of the Act, 21 U.S.C. section 360bbb-3(b)(1), unless the authorization is terminated or revoked sooner. Performed at [facility], [place] [address], [place], [place] [address]   Lipase, blood     Status: Abnormal   Collection Time: 08/06/18  2:05 PM  Result Value Ref Range   Lipase 70 (H) 11 - 51 U/L    Comment: Performed at [facility], [place] [address] [place], [place] [address]  Comprehensive metabolic panel     Status: Abnormal   Collection Time: 08/06/18  2:05 PM  Result Value Ref Range   Sodium 132 (L) 135 - 145 mmol/L   Potassium 4.2 3.5 - 5.1 mmol/L   Chloride 97  (L) 98 - 111 mmol/L   CO2 23 22 - 32 mmol/L   Glucose, Bld 110 (H) 70 - 99 mg/dL   BUN 41 (H) 8 - 23 mg/dL   Creatinine, Ser 0.82 0.61 - 1.24 mg/dL   Calcium 8.9 8.9 - 10.3 mg/dL   Total Protein 6.1 (L) 6.5 - 8.1 g/dL   Albumin 2.8 (L) 3.5 - 5.0 g/dL   AST 79 (H) 15 - 41 U/L   ALT 228 (H) 0 - 44 U/L   Alkaline Phosphatase 885 (H) 38 - 126 U/L   Total Bilirubin  1.3 (H) 0.3 - 1.2 mg/dL   GFR calc non Af Amer >60 >60 mL/min   GFR calc Af Amer >60 >60 mL/min   Anion gap 12 5 - 15    Comment: Performed at [facility], [place] [address], [place], [place] [address]  CBC with Differential     Status: Abnormal   Collection Time: 08/06/18  2:05 PM  Result Value Ref Range   WBC 2.4 (L) 4.0 - 10.5 K/uL   RBC 2.87 (L) 4.22 - 5.81 MIL/uL   Hemoglobin 8.9 (L) 13.0 - 17.0 g/dL   HCT 28.2 (L) 39.0 - 52.0 %   MCV 98.3 80.0 - 100.0 fL   MCH 31.0 26.0 - 34.0 pg   MCHC 31.6 30.0 - 36.0 g/dL   RDW 13.7 11.5 - 15.5 %   Platelets 69 (L) 150 - 400 K/uL    Comment: REPEATED TO VERIFY PLATELET COUNT CONFIRMED BY SMEAR SPECIMEN CHECKED FOR CLOTS Immature Platelet Fraction may be clinically indicated, consider ordering this additional test [id number]    nRBC 0.0 0.0 - 0.2 %   Neutrophils Relative % 78 %   Neutro Abs 1.8 1.7 - 7.7 K/uL   Lymphocytes Relative 17 %   Lymphs Abs 0.4 (L) 0.7 - 4.0 K/uL   Monocytes Relative 4 %   Monocytes Absolute 0.1 0.1 - 1.0 K/uL   Eosinophils Relative 0 %   Eosinophils Absolute 0.0 0.0 - 0.5 K/uL   Basophils Relative 0 %   Basophils Absolute 0.0 0.0 - 0.1 K/uL   Immature Granulocytes 1 %   Abs Immature Granulocytes 0.03 0.00 - 0.07 K/uL    Comment: Performed at [facility], [place] [address] [place], [place] [address]  Protime-INR     Status: None   Collection Time: 08/06/18  2:05 PM  Result Value Ref Range   Prothrombin Time 12.8 11.4 - 15.2 seconds   INR 1.0 0.8 - 1.2    Comment: (NOTE) INR goal varies based on device and  disease states. Performed at [facility], [place] [address] [place], [place] [address]    *Note: Due to a large number of results and/or encounters for the requested time period, some results have not been displayed. A complete set of results can be found in Results Review.   No results found.  Pending Labs Unresulted Labs (From admission, onward)    Start     Ordered   08/06/18 1507  Type and screen [place]  Once,   STAT    Comments: [place]    08/06/18 1507   Signed and Held  CBC  Tomorrow morning,   R     Signed and Held   Signed and Held  Comprehensive metabolic panel  Tomorrow morning,   R     Signed and Held          Vitals/Pain Today's Vitals   08/06/18 1210 08/06/18 1220 08/06/18 1500 08/06/18 1527  BP: 129/72  108/69 (!) 147/77  Pulse: (!) 107  86 98  Resp: 15  (!) 22 19  Temp: 98.1 F (36.7 C)  97.9 F (36.6 C)   TempSrc: Oral  Oral   SpO2: 99%  100% 99%  Weight:      Height:      PainSc:  0-No pain      Isolation Precautions No active isolations  Medications Medications  insulin aspart (novoLOG) injection 0-9 Units (has no administration in  time range)  Ampicillin-Sulbactam (UNASYN) 3 g in sodium chloride 0.9 % 100 mL IVPB (has no administration in time range)  sodium chloride 0.9 % bolus 1,000 mL (0 mLs Intravenous Stopped 08/06/18 1528)  Ampicillin-Sulbactam (UNASYN) 3 g in sodium chloride 0.9 % 100 mL IVPB (3 g Intravenous New Bag/Given 08/06/18 1529)  LORazepam (ATIVAN) tablet 0.5 mg (0.5 mg Oral Given 08/06/18 1527)    Mobility walks with device Low fall risk   Focused Assessments    R Recommendations: See Admitting Provider Note  Report given to:   Additional Notes:

## 2018-08-06 NOTE — Progress Notes (Addendum)
Rn got report at 67. Patient was transferred from ED to [place] at 1700.Alert and oriented x 4. No pain complained. Vital signs was taken, Room is set up. Call light was within patient's reach.

## 2018-08-06 NOTE — ED Triage Notes (Addendum)
Patient states he was sent over by doctor for abnormal labs. Liver enzymes elevated.   Patient suppose to have surgery next week but surgeon wants to have surgery tomorrow for Bile duct stent   Total Bilirubin 5.0 Alkaline Phosphatase-1305 AST-311 ALT-407   A/Ox4 Wheelchair in triage.

## 2018-08-06 NOTE — Anesthesia Preprocedure Evaluation (Addendum)
Anesthesia Evaluation  Patient identified by MRN, date of birth, ID band Patient awake    Reviewed: Allergy & Precautions, NPO status , Patient's Chart, lab work & pertinent test results, reviewed documented beta blocker date and time   Airway Mallampati: II  TM Distance: >3 FB Neck ROM: Full    Dental no notable dental hx. (+) Teeth Intact, Dental Advisory Given   Pulmonary asthma , former smoker,    Pulmonary exam normal breath sounds clear to auscultation       Cardiovascular hypertension, Pt. on home beta blockers and Pt. on medications + CAD, + Past MI and + CABG ( s/p 3V CABG 01/01/10 (LIMA to LAD, SVG to OM, SVG to RCA))  Normal cardiovascular exam Rhythm:Regular Rate:Normal  Thoracic aortic aneurysm 3.6x3.3cm Carotid stenosis 1-39%  TTE 2019 EF 60-65%, no valvular abnormalities  Stress Test 2019 Nuclear stress EF: 71%. Normal perfusion No signifcant ischemia or scar. This is a low risk study.   Neuro/Psych PSYCHIATRIC DISORDERS Anxiety negative neurological ROS     GI/Hepatic Neg liver ROS, PUD, GERD  ,  Endo/Other  negative endocrine ROSdiabetes, Oral Hypoglycemic Agents  Renal/GU negative Renal ROS  negative genitourinary   Musculoskeletal  (+) Arthritis ,   Abdominal   Peds  Hematology  (+) Blood dyscrasia (Hgb 8.9, plt 69), anemia ,   Anesthesia Other Findings On prednisone  Reproductive/Obstetrics                            Anesthesia Physical Anesthesia Plan  ASA: III  Anesthesia Plan: General   Post-op Pain Management:    Induction: Intravenous  PONV Risk Score and Plan: 2 and Ondansetron, Dexamethasone and Treatment may vary due to age or medical condition  Airway Management Planned: Oral ETT  Additional Equipment:   Intra-op Plan:   Post-operative Plan: Extubation in OR  Informed Consent: I have reviewed the patients History and Physical, chart, labs  and discussed the procedure including the risks, benefits and alternatives for the proposed anesthesia with the patient or authorized representative who has indicated his/her understanding and acceptance.   Patient has DNR.  Discussed DNR with patient and Suspend DNR.   Dental advisory given  Plan Discussed with: CRNA  Anesthesia Plan Comments:       Anesthesia Quick Evaluation

## 2018-08-06 NOTE — Progress Notes (Signed)
Pharmacy Antibiotic Note  Samuel Coleman is a 73 y.o. 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  To start unasyn for suspected intra-abd infection.  Plan: - unasyn 3gm IV q6h - with good renal function, pharmacy will sign off for abx. Re-consult [place] if need further assistance. _____________________________________  Height: 5\' 9"  (175.3 cm) Weight: 138 lb (62.6 kg) IBW/kg (Calculated) : 70.7  Temp (24hrs), Avg:98 F (36.7 C), Min:97.9 F (36.6 C), Max:98.1 F (36.7 C)  Recent Labs  Lab 08/06/18 1405  WBC 2.4*  CREATININE 0.82    Estimated Creatinine Clearance: 70 mL/min (by C-G formula based on SCr of 0.82 mg/dL).    Allergies  Allergen Reactions  . Clindamycin/Lincomycin Itching and Rash    All-over itching; required a course of Prednisone to treat  . Levofloxacin Nausea Only and Other (See Comments)    Insomnia     Thank you for allowing pharmacy to be a part of this patient's care.  Lynelle Doctor 08/06/2018 4:17 PM

## 2018-08-06 NOTE — ED Provider Notes (Signed)
[place] DEPT Provider Note   CSN: [id number] Arrival date & time: 08/06/18  1202     History   Chief Complaint Chief Complaint  Patient presents with  . Abnormal Lab    HPI Samuel Coleman is a 73 y.o. male.  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  He was at [facility] a few days ago when he noticed he was starting to become more jaundiced and his urine was dark.  He received some IV fluids and was a little improved.  His oncologist and his GI doctor recommended that he come here and get admitted to have his biliary stent replaced.  He had a stent placed a few months ago.  His last chemo was about a week ago.  No fevers.  He has had some fatigue.  No cough and minimal abdominal pain.     The history is provided by the patient.  Weakness Severity:  Moderate Onset quality:  Gradual Timing:  Constant Progression:  Partially resolved Context: dehydration   Relieved by:  Rest Worsened by:  Activity Ineffective treatments:  Drinking fluids Associated symptoms: abdominal pain (mild, intermittent) and difficulty walking   Associated symptoms: no chest pain, no cough, no diarrhea, no dysuria, no numbness in extremities, no falls, no fever, no foul-smelling urine, no loss of consciousness, no melena, no shortness of breath, no stroke symptoms and no vomiting     Past Medical History:  Diagnosis Date  . Anxiety   . Arthritis   . Asthma    exercise induced  . Back pain, chronic    gets injections in back  . CAD (coronary artery disease)    a.  prior inferior MI; s/p 3V CABG 01/01/10 (LIMA to LAD, SVG to OM, SVG to RCA- Dr.Bartle);  b. Last Myoview (05/2012):  Normal; EF 70% // c. Myoview 2/18: EF 60, no ischemia or infarction, Normal Study  . Carotid stenosis    Carotid US 03/2016 1-39% BICA  . Diabetes mellitus type 2 in nonobese (HCC)   . Frequent PVCs   . GERD (gastroesophageal  reflux disease)   . Gout   . History of kidney stones    no problems with stones  . Hx of echocardiogram    Echo (01/2010):  EF 55-60%, ? Apical HK, mild BAE, atrial septal aneurysm.  Marland Kitchen Hyperlipidemia   . Hypertension   . Ischemic colitis ([place])    noted on CT back in August 2013 possible related to hypotension  . Pneumonia    x 2  . Premature atrial contractions   . PUD (peptic ulcer disease) 1974  . ST elevation myocardial infarction (STEMI) of inferior wall ([place]) 2011   s/p CABG 2011  . Thoracic aortic aneurysm ([place])    a. last assessed 11/2017 3.6x3.3cm.  . 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 Otitis externa 08/04/2017  . Anxiety 08/04/2017  . S/P left inguinal hernia repair 11/02/2014  . Carotid artery disease ([place]) 03/29/2013  . S/P right inguinal hernia repair 12/16/2012  . Chest pain 12/07/2011  . Abdominal pain, other specified site 12/07/2011  . Other and unspecified noninfectious gastroenteritis and colitis(558.9) 10/05/2011  . 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 Right inguinal hernia-scrotum 08/23/2011  . Carotid bruit 01/16/2011  . Coronary artery disease involving native coronary artery of native heart without angina  pectoris 07/19/2010  . Hyperlipidemia 01/15/2010  . GOUT 01/15/2010  . Essential hypertension 01/15/2010  . Aneurysm of thoracic aorta ([place]) 01/15/2010    Past Surgical History:  Procedure Laterality Date  . BILIARY STENT PLACEMENT N/A 04/21/2018   Procedure: BILIARY STENT PLACEMENT;  Surgeon: Carol Ada, MD;  Location: [facility];  Service: Endoscopy;  Laterality: N/A;  . BILIARY STENT PLACEMENT N/A 05/01/2018   Procedure: BILIARY STENT PLACEMENT;  Surgeon: Carol Ada, MD;  Location: [facility];  Service: Endoscopy;  Laterality: N/A;  . CARDIAC CATHETERIZATION    . CHEST TUBE INSERTION  1992   fx lt ribs-collapsed lung  . CORONARY ARTERY  BYPASS GRAFT  11/11   X 3 VESSELS  . cyst removed from back of neck    . cyst removel on right buttocks - 2014    . EAR CYST EXCISION Left 09/02/2012   Procedure: EXCISION SEBACEOUS CYST LEFT POSTERIOR LEG;  Surgeon: Pedro Earls, MD;  Location: [place];  Service: General;  Laterality: Left;  . ENDOSCOPIC RETROGRADE CHOLANGIOPANCREATOGRAPHY (ERCP) WITH PROPOFOL N/A 04/21/2018   Procedure: ENDOSCOPIC RETROGRADE CHOLANGIOPANCREATOGRAPHY (ERCP) WITH PROPOFOL;  Surgeon: Carol Ada, MD;  [facility];  Service: Endoscopy;  Laterality: N/A;  . ENDOSCOPIC RETROGRADE CHOLANGIOPANCREATOGRAPHY (ERCP) WITH PROPOFOL N/A 05/01/2018   Procedure: ENDOSCOPIC RETROGRADE CHOLANGIOPANCREATOGRAPHY (ERCP) WITH PROPOFOL;  Surgeon: Carol Ada, MD;  [facility];  Service: Endoscopy;  Laterality: N/A;  . Excision of deep left neck mass. SURGEON:  Leonides Sake. Lucia Gaskins, M.D.  Cramer.Melena  . EYE SURGERY  both cararacts  . HERNIA REPAIR    . INGUINAL HERNIA REPAIR Right 12/02/2012   Procedure: OPEN RIGHT INGUINAL HERINA;  Surgeon: Pedro Earls, MD;  Location: [facility];  Service: General;  Laterality: Right;  . INGUINAL HERNIA REPAIR Left 11/02/2014   Procedure: OPEN LEFT INGUINAL HERNIA REPAIR WITH MESH;  Surgeon: Johnathan Hausen, MD;  Location: [facility];  Service: General;  Laterality: Left;  . INSERTION OF MESH Right 12/02/2012   Procedure: INSERTION OF MESH;  Surgeon: Pedro Earls, MD;  [facility]: [facility];  Service: General;  Laterality: Right;  . LUMBAR LAMINECTOMY/DECOMPRESSION MICRODISCECTOMY N/A 01/26/2015   Procedure: Lumbar 5-sacrum 1 decompression;  Surgeon: Phylliss Bob, MD;  Location: [place];  Service: Orthopedics;  Laterality: N/A;  . SPHINCTEROTOMY  04/21/2018   Procedure: SPHINCTEROTOMY;  Surgeon: Carol Ada, MD;  Location: [facility];  Service: Endoscopy;;  . Joan Mayans  05/01/2018   Procedure: Joan Mayans;  Surgeon: Carol Ada, MD;  Location: [facility];   Service: Endoscopy;;  . Bess Kinds CHOLANGIOSCOPY N/A 05/01/2018   Procedure: [id number] CHOLANGIOSCOPY;  Surgeon: Carol Ada, MD;  [facility];  Service: Endoscopy;  Laterality: N/A;  . STENT REMOVAL  05/01/2018   Procedure: STENT REMOVAL;  Surgeon: Carol Ada, MD;  [facility];  Service: Endoscopy;;  . TONSILLECTOMY          Home Medications    Prior to Admission medications   Medication Sig Start Date End Date Taking? Authorizing Provider  acetaminophen (TYLENOL) 500 MG tablet Take 1,000 mg by mouth daily as needed for moderate pain or headache.    [provider]  allopurinol (ZYLOPRIM) 300 MG tablet Take 300 mg by mouth every morning.     [provider]  Ascorbic Acid (VITAMIN C) 1000 MG tablet Take 1,000 mg by mouth every morning.     [provider]  aspirin 81 MG tablet Take 81 mg by mouth daily.    [provider]  camphor-phenol (CAMPHO-PHENIQUE) 10.8-4.7 % LIQD Apply 1 application topically daily as needed (fever blisters).    [provider]  cholecalciferol (VITAMIN D3) 25 MCG (1000 UT) tablet Take 1,000 Units by mouth daily.    [provider]  clotrimazole (LOTRIMIN) 1 % cream Apply 1 application topically daily as needed (athlete's foot).    [provider]  clotrimazole (MYCELEX) 10 MG troche Take 10 mg by mouth 5 (five) times daily.    [provider]  clotrimazole-betamethasone (LOTRISONE) cream Apply 1 application topically 2 (two) times daily. Apply to both feet 03/21/16   [provider]  Digestive Enzyme CAPS Take 2 capsules by mouth 3 (three) times daily with meals.    [provider]  diphenhydrAMINE (BENADRYL) 12.5 MG/5ML liquid Take 12.5-25 mg by mouth daily as needed for allergies.    [provider]  diphenhydrAMINE (BENADRYL) 50 MG capsule Take 50 mg by mouth at bedtime as needed for sleep.    [provider]  hydrocortisone cream 1 %  Apply 1 application topically as needed (for itching on ears or feet).     [provider]  lisinopril (PRINIVIL,ZESTRIL) 40 MG tablet Take 1 tablet (40 mg total) by mouth daily. 12/25/17   Dunn, Nedra Hai, PA-C  Magnesium Oxide 400 (240 Mg) MG TABS Take 2 tablets (800 mg total) by mouth 2 (two) times daily. 03/13/18   [name], MD  metFORMIN (GLUCOPHAGE) 1000 MG tablet Take 1,000 mg by mouth 2 (two) times daily with a meal. 12/25/17   [provider]  methocarbamol (ROBAXIN) 500 MG tablet Take 500 mg by mouth 4 (four) times daily as needed for muscle spasms.     [provider]  metoprolol tartrate (LOPRESSOR) 50 MG tablet TAKE 1 TABLET BY MOUTH TWICE A DAY 06/01/18   [name], MD  Multiple Vitamin (MULTIVITAMIN WITH MINERALS) TABS tablet Take 1 tablet by mouth every morning.     [provider]  mupirocin ointment (BACTROBAN) 2 % Place 1 application into the nose daily as needed (wound care).    [provider]  mycophenolate (CELLCEPT) 500 MG tablet Take 500 mg by mouth 2 (two) times daily.  04/07/18   [provider]  neomycin-bacitracin-polymyxin (NEOSPORIN) [phone] ointment Apply 1 application topically 2 (two) times daily as needed (FOR FOOT IRRITATION).     [provider]  nitroGLYCERIN (NITROSTAT) 0.4 MG SL tablet Place 1 tablet (0.4 mg total) under the tongue every 5 (five) minutes as needed for chest pain. 11/26/16   Daune Perch, NP  Polyvinyl Alcohol-Povidone (REFRESH OP) Place 1 drop into both eyes daily as needed (dry eyes).    [provider]  predniSONE (DELTASONE) 1 MG tablet Take 4 mg by mouth daily with breakfast.    [provider]  simvastatin (ZOCOR) 40 MG tablet Take 40 mg by mouth daily.     [provider]  sitaGLIPtin (JANUVIA) 100 MG tablet Take 100 mg by mouth daily.    [provider]  ursodiol (ACTIGALL) 300 MG capsule Take 300 mg by mouth 4  (four) times daily.  11/07/17   [provider]  vitamin B-12 (CYANOCOBALAMIN) 1000 MCG tablet Take 1,000 mcg by mouth daily.    [provider]  vitamin E 400 UNIT capsule Take 400 Units by mouth daily.    [provider]    Family History Family History  Problem Relation Age of Onset  . Heart failure  Mother   . Pneumonia Father   . Lung cancer Sister   . Esophageal cancer Sister   . Pancreatic cancer Maternal Grandmother     Social History Social History   Tobacco Use  . Smoking status: Former Smoker    Quit date: 02/25/1974    Years since quitting: 44.4  . Smokeless tobacco: Never Used  Substance Use Topics  . Alcohol use: Yes    Comment: rarely  . Drug use: No     Allergies   Clindamycin/lincomycin and Levofloxacin   Review of Systems Review of Systems  Constitutional: Positive for fatigue. Negative for fever.  HENT: Negative for sore throat.   Eyes: Negative for visual disturbance.  Respiratory: Negative for cough and shortness of breath.   Cardiovascular: Negative for chest pain.  Gastrointestinal: Positive for abdominal pain (mild, intermittent). Negative for diarrhea, melena and vomiting.  Genitourinary: Negative for dysuria.  Musculoskeletal: Negative for falls and neck pain.  Skin: Positive for color change (jaundice).  Neurological: Positive for weakness. Negative for loss of consciousness.     Physical Exam Updated Vital Signs BP 129/72 (BP Location: Right Arm)   Pulse (!) 107   Temp 98.1 F (36.7 C) (Oral)   Resp 15   Ht 5\' 9"  (1.753 m)   Wt 62.6 kg   SpO2 99%   BMI 20.38 kg/m   Physical Exam Vitals signs and nursing note reviewed.  Constitutional:      Appearance: He is well-developed.  HENT:     Head: Normocephalic and atraumatic.  Eyes:     Conjunctiva/sclera: Conjunctivae normal.  Neck:     Musculoskeletal: Neck supple.  Cardiovascular:     Rate and Rhythm: Regular rhythm. Tachycardia present.     Heart  sounds: No murmur.  Pulmonary:     Effort: Pulmonary effort is normal. No respiratory distress.     Breath sounds: Normal breath sounds.  Abdominal:     Palpations: Abdomen is soft.     Tenderness: There is no abdominal tenderness.  Musculoskeletal: Normal range of motion.        General: No tenderness.     Right lower leg: No edema.     Left lower leg: No edema.  Skin:    General: Skin is warm and dry.     Capillary Refill: Capillary refill takes less than 2 seconds.     Coloration: Skin is jaundiced (mild).  Neurological:     General: No focal deficit present.     Mental Status: He is alert and oriented to person, place, and time.      ED Treatments / Results  Labs (all labs ordered are listed, but only abnormal results are displayed) Labs Reviewed  LIPASE, BLOOD - Abnormal; Notable for the following components:      Result Value   Lipase 70 (*)    All other components within normal limits  COMPREHENSIVE METABOLIC PANEL - Abnormal; Notable for the following components:   Sodium 132 (*)    Chloride 97 (*)    Glucose, Bld 110 (*)    BUN 41 (*)    Total Protein 6.1 (*)    Albumin 2.8 (*)    AST 79 (*)    ALT 228 (*)    Alkaline Phosphatase 885 (*)    Total Bilirubin 1.3 (*)    All other components within normal limits  CBC WITH DIFFERENTIAL/PLATELET - Abnormal; Notable for the following components:   WBC 2.4 (*)    RBC 2.87 (*)  Hemoglobin 8.9 (*)    HCT 28.2 (*)    Platelets 69 (*)    Lymphs Abs 0.4 (*)    All other components within normal limits  GLUCOSE, CAPILLARY - Abnormal; Notable for the following components:   Glucose-Capillary 119 (*)    All other components within normal limits  GLUCOSE, CAPILLARY - Abnormal; Notable for the following components:   Glucose-Capillary 162 (*)    All other components within normal limits  GLUCOSE, CAPILLARY - Abnormal; Notable for the following components:   Glucose-Capillary 111 (*)    All other components within  normal limits  CBG MONITORING, ED - Abnormal; Notable for the following components:   Glucose-Capillary 107 (*)    All other components within normal limits  SARS CORONAVIRUS 2 (HOSPITAL ORDER, [facility] LAB)  MRSA PCR SCREENING  PROTIME-INR  CBC  COMPREHENSIVE METABOLIC PANEL  TYPE AND SCREEN  ABO/RH    EKG EKG Interpretation  Date/Time:  Thursday August 06 2018 13:36:34 EDT Ventricular Rate:  97 PR Interval:    QRS Duration: 83 QT Interval:  332 QTC Calculation: 422 R Axis:   87 Text Interpretation:  Sinus rhythm Ventricular premature complex Probable lateral infarct, age indeterminate Minimal ST elevation, inferior leads increased rate from prior 10/19 Confirmed by Aletta Edouard [id number]) on 08/06/2018 1:46:44 PM   Radiology No results found.  Procedures Procedures (including critical care time)  Medications Ordered in ED Medications - No data to display   Initial Impression / Assessment and Plan / ED Course  I have reviewed the triage vital signs and the nursing notes.  Pertinent labs & imaging results that were available during my care of the patient were reviewed by me and considered in my medical decision making (see chart for details).  Clinical Course as of Aug 07 747  Thu Aug 06, 2018  1347 Differential diagnosis includes obstructive jaundice, hepatitis, sepsis.  I placed a call to Dr. Benson Norway from GI who asked that the hospitalist admit the patient and he is anticipated for a endoscopy tomorrow for biliary stent change.  Yes that the patient be started on some antibiotics.   [MB]  2671 Patient's lab work showing LFTs elevated as expected.  He is also a lot more anemic than prior values that we have on him.  He said he is required blood transfusions in the past but not in a long time.  I placed a call into the hospitalist for admission.   [MB]  14 Discussed with Dr. Broadus John [facility] hospitalist who will evaluate the patient for admission.    [MB]    Clinical Course User Index [MB] Hayden Rasmussen, MD   Sidonie Dickens was evaluated in Emergency Department on 08/06/2018 for the symptoms described in the history of present illness. He was evaluated in the context of the global COVID-19 pandemic, which necessitated consideration that the patient might be at risk for infection with the SARS-CoV-2 virus that causes COVID-19. Institutional protocols and algorithms that pertain to the evaluation of patients at risk for COVID-19 are in a state of rapid change based on information released by regulatory bodies including the CDC and federal and state organizations. These policies and algorithms were followed during the patient's care in the ED.      Final Clinical Impressions(s) / ED Diagnoses   Final diagnoses:  LFT elevation  Biliary sclerosis    ED Discharge Orders    None       Aletta Edouard  C, MD 08/07/18 6060

## 2018-08-07 ENCOUNTER — Inpatient Hospital Stay (HOSPITAL_COMMUNITY): Payer: Medicare Other

## 2018-08-07 ENCOUNTER — Encounter (HOSPITAL_COMMUNITY): Payer: Medicare Other

## 2018-08-07 ENCOUNTER — Encounter (HOSPITAL_COMMUNITY): Payer: Self-pay | Admitting: Anesthesiology

## 2018-08-07 ENCOUNTER — Inpatient Hospital Stay (HOSPITAL_COMMUNITY): Payer: Medicare Other | Admitting: Anesthesiology

## 2018-08-07 ENCOUNTER — Encounter (HOSPITAL_COMMUNITY)
Admission: EM | Disposition: A | Discharge status: Home / Self Care | Payer: Self-pay | Source: Home / Self Care | Attending: Internal Medicine

## 2018-08-07 HISTORY — PX: STENT REMOVAL: SHX6421

## 2018-08-07 HISTORY — PX: BILIARY STENT PLACEMENT: SHX5538

## 2018-08-07 HISTORY — PX: ERCP: SHX5425

## 2018-08-07 LAB — COMPREHENSIVE METABOLIC PANEL
ALT: 147 U/L — ABNORMAL HIGH (ref 0–44)
AST: 41 U/L (ref 15–41)
Albumin: 2.6 g/dL — ABNORMAL LOW (ref 3.5–5.0)
Alkaline Phosphatase: 708 U/L — ABNORMAL HIGH (ref 38–126)
Anion gap: 5 (ref 5–15)
BUN: 25 mg/dL — ABNORMAL HIGH (ref 8–23)
CO2: 25 mmol/L (ref 22–32)
Calcium: 7.8 mg/dL — ABNORMAL LOW (ref 8.9–10.3)
Chloride: 104 mmol/L (ref 98–111)
Creatinine, Ser: 0.63 mg/dL (ref 0.61–1.24)
GFR calc Af Amer: >60 mL/min (ref >60)
GFR calc non Af Amer: >60 mL/min (ref >60)
Glucose, Bld: 128 mg/dL — ABNORMAL HIGH (ref 70–99)
Potassium: 3.4 mmol/L — ABNORMAL LOW (ref 3.5–5.1)
Sodium: 134 mmol/L — ABNORMAL LOW (ref 135–145)
Total Bilirubin: 1 mg/dL (ref 0.3–1.2)
Total Protein: 5.6 g/dL — ABNORMAL LOW (ref 6.5–8.1)

## 2018-08-07 LAB — CBC
HCT: 25.9 % — ABNORMAL LOW (ref 39.0–52.0)
Hemoglobin: 8.4 g/dL — ABNORMAL LOW (ref 13.0–17.0)
MCH: 31.7 pg (ref 26.0–34.0)
MCHC: 32.4 g/dL (ref 30.0–36.0)
MCV: 97.7 fL (ref 80.0–100.0)
Platelets: 52 10*3/uL — ABNORMAL LOW (ref 150–400)
RBC: 2.65 MIL/uL — ABNORMAL LOW (ref 4.22–5.81)
RDW: 13.8 % (ref 11.5–15.5)
WBC: 1.8 10*3/uL — ABNORMAL LOW (ref 4.0–10.5)
nRBC: 0 % (ref 0.0–0.2)

## 2018-08-07 LAB — GLUCOSE, CAPILLARY
Glucose-Capillary: 111 mg/dL — ABNORMAL HIGH (ref 70–99)
Glucose-Capillary: 117 mg/dL — ABNORMAL HIGH (ref 70–99)

## 2018-08-07 LAB — ABO/RH: ABO/RH(D): O POS

## 2018-08-07 SURGERY — ERCP, WITH INTERVENTION IF INDICATED
Anesthesia: General

## 2018-08-07 MED ORDER — INDOMETHACIN 50 MG RE SUPP
RECTAL | Status: AC
  Filled 2018-08-07: qty 2

## 2018-08-07 MED ORDER — SODIUM CHLORIDE 0.9 % IV SOLN
INTRAVENOUS | Status: DC | PRN
  Administered 2018-08-07: 20 ug/min via INTRAVENOUS

## 2018-08-07 MED ORDER — SODIUM CHLORIDE 0.9 % IV SOLN
INTRAVENOUS | Status: DC | PRN
  Administered 2018-08-07: 30 mL

## 2018-08-07 MED ORDER — PHENYLEPHRINE HCL (PRESSORS) 10 MG/ML IV SOLN
INTRAVENOUS | Status: AC
  Filled 2018-08-07: qty 1

## 2018-08-07 MED ORDER — LIDOCAINE 2% (20 MG/ML) 5 ML SYRINGE
INTRAMUSCULAR | Status: AC
  Filled 2018-08-07: qty 5

## 2018-08-07 MED ORDER — SUGAMMADEX SODIUM 200 MG/2ML IV SOLN
INTRAVENOUS | Status: AC
  Filled 2018-08-07: qty 2

## 2018-08-07 MED ORDER — PROPOFOL 10 MG/ML IV BOLUS
INTRAVENOUS | Status: AC
  Filled 2018-08-07: qty 40

## 2018-08-07 MED ORDER — LIDOCAINE HCL (CARDIAC) PF 100 MG/5ML IV SOSY
PREFILLED_SYRINGE | INTRAVENOUS | Status: DC | PRN
  Administered 2018-08-07: 60 mg via INTRAVENOUS

## 2018-08-07 MED ORDER — PROPOFOL 10 MG/ML IV BOLUS
INTRAVENOUS | Status: DC | PRN
  Administered 2018-08-07: 100 mg via INTRAVENOUS

## 2018-08-07 MED ORDER — LACTATED RINGERS IV SOLN
INTRAVENOUS | Status: DC
  Administered 2018-08-07: 1000 mL via INTRAVENOUS

## 2018-08-07 MED ORDER — HEPARIN SOD (PORK) LOCK FLUSH 100 UNIT/ML IV SOLN
500.0000 [IU] | INTRAVENOUS | Status: AC | PRN
  Administered 2018-08-07: 15:00:00 500 [IU]

## 2018-08-07 MED ORDER — FENTANYL CITRATE (PF) 100 MCG/2ML IJ SOLN
INTRAMUSCULAR | Status: DC | PRN
  Administered 2018-08-07: 50 ug via INTRAVENOUS

## 2018-08-07 MED ORDER — GLUCAGON HCL RDNA (DIAGNOSTIC) 1 MG IJ SOLR
INTRAMUSCULAR | Status: AC
  Filled 2018-08-07: qty 1

## 2018-08-07 MED ORDER — PREDNISONE 1 MG PO TABS: 4.0000 mg | ORAL_TABLET | Freq: Every day | ORAL | 0 refills | Status: DC

## 2018-08-07 MED ORDER — ROCURONIUM BROMIDE 100 MG/10ML IV SOLN
INTRAVENOUS | Status: DC | PRN
  Administered 2018-08-07: 60 mg via INTRAVENOUS

## 2018-08-07 MED ORDER — FENTANYL CITRATE (PF) 100 MCG/2ML IJ SOLN
INTRAMUSCULAR | Status: AC
  Filled 2018-08-07: qty 2

## 2018-08-07 MED ORDER — SODIUM CHLORIDE 0.9% FLUSH: 10.0000 mL | INTRAVENOUS | Status: DC | PRN

## 2018-08-07 MED ORDER — SUGAMMADEX SODIUM 200 MG/2ML IV SOLN
INTRAVENOUS | Status: DC | PRN
  Administered 2018-08-07: 150 mg via INTRAVENOUS

## 2018-08-07 MED ORDER — ONDANSETRON HCL 4 MG/2ML IJ SOLN
INTRAMUSCULAR | Status: AC
  Filled 2018-08-07: qty 2

## 2018-08-07 MED ORDER — FENTANYL CITRATE (PF) 100 MCG/2ML IJ SOLN: 25.0000 ug | INTRAMUSCULAR | Status: DC | PRN

## 2018-08-07 MED ORDER — SUCCINYLCHOLINE CHLORIDE 200 MG/10ML IV SOSY
PREFILLED_SYRINGE | INTRAVENOUS | Status: AC
  Filled 2018-08-07: qty 10

## 2018-08-07 MED ORDER — ROCURONIUM BROMIDE 10 MG/ML (PF) SYRINGE
PREFILLED_SYRINGE | INTRAVENOUS | Status: AC
  Filled 2018-08-07: qty 10

## 2018-08-07 MED ORDER — LORAZEPAM 2 MG/ML IJ SOLN
0.5000 mg | Freq: Once | INTRAMUSCULAR | Status: AC
  Administered 2018-08-07: 0.5 mg via INTRAVENOUS
  Filled 2018-08-07: qty 1

## 2018-08-07 NOTE — Anesthesia Postprocedure Evaluation (Signed)
Anesthesia Post Note  Patient: Samuel Coleman  Procedure(s) Performed: ENDOSCOPIC RETROGRADE CHOLANGIOPANCREATOGRAPHY (ERCP) (N/A ) STENT REMOVAL BILIARY STENT PLACEMENT (N/A )     Patient location during evaluation: PACU Anesthesia Type: General Level of consciousness: awake and alert Pain management: pain level controlled Vital Signs Assessment: post-procedure vital signs reviewed and stable Respiratory status: spontaneous breathing, nonlabored ventilation, respiratory function stable and patient connected to nasal cannula oxygen Cardiovascular status: blood pressure returned to baseline and stable Postop Assessment: no apparent nausea or vomiting Anesthetic complications: no    Last Vitals:  Vitals:   08/07/18 1050 08/07/18 1100  BP: 130/70 125/67  Pulse: 68 71  Resp: 14 17  Temp:    SpO2: 100% 99%    Last Pain:  Vitals:   08/07/18 1100  TempSrc:   PainSc: 0-No pain                 Dakotah Orrego L Tarvis Blossom

## 2018-08-07 NOTE — Progress Notes (Signed)
Pts IV removed with a clean and dry dressing intact. Pt denies pain at this time with no s/s of distress noted. Educated pt on  D/C instructions, medications, and follow up appointments, all of pts questions were answered at the time.

## 2018-08-07 NOTE — Anesthesia Procedure Notes (Signed)
Procedure Name: Intubation Date/Time: 08/07/2018 9:15 AM Performed by: Garrel Ridgel, CRNA Pre-anesthesia Checklist: Patient identified, Emergency Drugs available, Suction available, Patient being monitored and Timeout performed Patient Re-evaluated:Patient Re-evaluated prior to induction Oxygen Delivery Method: Circle system utilized Preoxygenation: Pre-oxygenation with 100% oxygen Induction Type: IV induction Ventilation: Mask ventilation without difficulty Laryngoscope Size: Mac and 3 Grade View: Grade I Tube type: Oral Tube size: 7.5 mm Number of attempts: 1 Airway Equipment and Method: Stylet Placement Confirmation: ETT inserted through vocal cords under direct vision,  positive ETCO2 and breath sounds checked- equal and bilateral Secured at: 22 cm Tube secured with: Tape Dental Injury: Teeth and Oropharynx as per pre-operative assessment

## 2018-08-07 NOTE — Op Note (Signed)
[facility] Patient Name: Samuel Coleman Procedure Date: 08/07/2018 MRN: [id number] Attending MD: Carol Ada , MD Date of Birth: 1946-02-05 CSN: [id number] Age: 73 Admit Type: Inpatient Procedure:                ERCP Indications:              Stent change Providers:                Carol Ada, MD, Cleda Daub, RN, Elspeth Cho, Technician Referring MD:              Medicines:                General Anesthesia Complications:            No immediate complications. Estimated Blood Loss:     Estimated blood loss: none. Procedure:                Pre-Anesthesia Assessment:                           - Prior to the procedure, a History and Physical                            was performed, and patient medications and                            allergies were reviewed. The patient's tolerance of                            previous anesthesia was also reviewed. The risks                            and benefits of the procedure and the sedation                            options and risks were discussed with the patient.                            All questions were answered, and informed consent                            was obtained. Prior Anticoagulants: The patient has                            taken no previous anticoagulant or antiplatelet                            agents. ASA Grade Assessment: III - A patient with                            severe systemic disease. After reviewing the risks                            and benefits, the patient  was deemed in                            satisfactory condition to undergo the procedure.                           - Sedation was administered by an anesthesia                            professional. General anesthesia was attained.                           After obtaining informed consent, the scope was                            passed under direct vision. Throughout the                             procedure, the patient's blood pressure, pulse, and                            oxygen saturations were monitored continuously. The                            [id number]-[id number] ([id number] Olympus duodenoscope was                            introduced through the mouth, and used to inject                            contrast into and used to inject contrast into the                            bile duct. The ERCP was technically difficult and                            complex. The patient tolerated the procedure well. Scope In: Scope Out: Findings:      One stent was removed from the common bile duct using a snare. The bile       duct was deeply cannulated with the short-nosed traction sphincterotome.       Contrast was injected. I personally interpreted the bile duct images.       Ductal flow of contrast was adequate. Image quality was adequate.       Contrast extended to the main bile duct. The middle third of the main       bile duct contained a single segmental stenosis 20 mm in length. A short       0.035 inch Soft Jagwire was passed into the biliary tree. Biliary       sphincterotomy was made with a traction (standard) sphincterotome. There       was no post-sphincterotomy bleeding. One 10 mm by 4 cm covered metal       stent was placed 4 cm into the common bile duct. Bile flowed through the       stent. The stent was in good position.  The plastic biliary stent was difficult to locate as it appeared to be       embedded and hiding in between mucosal folds. The rat-toothed forceps       were able to be used to pull the stent out and then full removal was       with a snare. The stent was clogged and there was some mild evidence of       cholangitis. A 10 x 40 mm covered metallic stent was able to be inserted       without difficulty. 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 The                             stricture was malignant appearing.                           - One stent was removed from the common bile duct.                           - A biliary sphincterotomy was performed.                           - One covered metal stent was placed into the                            common bile duct. Moderate Sedation:      Not Applicable - Patient had care per Anesthesia. Recommendation:           - Return patient to hospital ward for ongoing care.                           - Resume regular diet.                           - Okay to D/C home.                           - Treat with Augmentin 875 mg BID x 3 days. Procedure Code(s):        --- Professional ---                           [phone], Endoscopic retrograde                            cholangiopancreatography (ERCP); with removal and                            exchange of stent(s), biliary or pancreatic duct,                            including pre- and post-dilation and guide wire  passage, when performed, including sphincterotomy,                            when performed, each stent exchanged                           [phone], Endoscopic catheterization of the biliary                            ductal system, radiological supervision and                            interpretation Diagnosis Code(s):        --- Professional ---                           K83.1, Obstruction of bile duct                           Z46.59, Encounter for fitting and adjustment of                            other gastrointestinal appliance and device CPT copyright 2019 [employer]. All rights reserved. The codes documented in this report are preliminary and upon coder review may  be revised to meet current compliance requirements. Carol Ada, MD Carol Ada, MD 08/07/2018 10:34:05 AM This report has been signed electronically. Number of Addenda: 0

## 2018-08-07 NOTE — Interval H&P Note (Signed)
History and Physical Interval Note:  08/07/2018 8:47 AM  Samuel Coleman  has presented today for surgery, with the diagnosis of Biliary obstruction.  The various methods of treatment have been discussed with the patient and family. After consideration of risks, benefits and other options for treatment, the patient has consented to  Procedure(s): ENDOSCOPIC RETROGRADE CHOLANGIOPANCREATOGRAPHY (ERCP) (N/A) as a surgical intervention.  The patient's history has been reviewed, patient examined, no change in status, stable for surgery.  I have reviewed the patient's chart and labs.  Questions were answered to the patient's satisfaction.     Shazia Mitchener D

## 2018-08-07 NOTE — Transfer of Care (Signed)
Immediate Anesthesia Transfer of Care Note  Patient: Samuel Coleman  Procedure(s) Performed: ENDOSCOPIC RETROGRADE CHOLANGIOPANCREATOGRAPHY (ERCP) (N/A ) STENT REMOVAL BILIARY STENT PLACEMENT (N/A )  Patient Location: PACU  Anesthesia Type:General  Level of Consciousness: awake, oriented and drowsy  Airway & Oxygen Therapy: Patient Spontanous Breathing and Patient connected to face mask oxygen  Post-op Assessment: Report given to RN and Post -op Vital signs reviewed and stable  Post vital signs: Reviewed and stable  Last Vitals:  Vitals Value Taken Time  BP    Temp    Pulse    Resp    SpO2      Last Pain:  Vitals:   08/07/18 0819  TempSrc: Oral  PainSc: 0-No pain         Complications: No apparent anesthesia complications

## 2018-08-08 NOTE — Discharge Summary (Signed)
Physician Discharge Summary  Samuel Coleman DTO:[id number] DOB: 11-12-1945 DOA: 08/06/2018  PCP: Jani Gravel, MD  Admit date: 08/06/2018 Discharge date: 08/08/2018  Recommendations for Outpatient Follow-up:  1. Follow up with PCP in 7-10 days. 2. Keep appointments with GI and oncology.   Discharge Diagnoses: Principal diagnosis is #1 1. Cholangitis due to blocked biliary stent  Discharge Condition: Fair Disposition: Home  Diet recommendation: Diabetic diet.  Filed Weights   08/06/18 1209 08/07/18 0819  Weight: 62.6 kg 62.6 kg    History of present illness:  Samuel Coleman is a 73 y.o. 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 -He was sent to the emergency room by Dr. Benson Norway today due to worsening LFTs, alkaline phosphatase concerning for obstructed stent possibly and stent exchange  Hospital Course:  The patient was admitted to a medical bed. He was given IV Unasyn. He underwent ERCP with Dr. Benson Norway on 08/07/2018 with extraction of clogged biliary stent and placement of a new covered metal stent. He tolerated the procedure well and was discharged to home in fair condition as he had been cleared for discharge by Dr. Benson Norway.  Today's assessment: S: The patient is resting comfortably. No new complaints. O: Vitals:  Vitals:   08/07/18 1100 08/07/18 1412  BP: 125/67 126/71  Pulse: 71 78  Resp: 17 20  Temp:  97.8 F (36.6 C)  SpO2: 99% 100%    Constitutional:  . The patient is awake, alert, and oriented x 3. Respiratory:  . No increased work of breathing.  . No wheezes, rales, or rhonchi.  . No tactile  fremitus. Cardiovascular:  . Regular rate and rhythm. . No murmurs, ectopy, or gallups. . No lateral PMI. No thrills. Abdomen:  . Abdomen is soft, non-tender, non-distended . No hernias, masses, or organomegaly are appreciated. Marland Kitchen Hypoactive bowel sounds. Musculoskeletal:  . No cyanosis, clubbing, or edema Skin:  . No rashes, lesions, ulcers . palpation of skin: no induration or nodules Neurologic:  . CN 2-12 intact . Sensation all 4 extremities intact Psychiatric:  . judgement and insight appear normal . Mental status o Mood, affect appropriate o Orientation to person, place, time   Discharge Instructions  Discharge Instructions    Activity as tolerated - No restrictions   Complete by: As directed    Call MD for:  persistant nausea and vomiting   Complete by: As directed    Call MD for:  severe uncontrolled pain   Complete by: As directed    Diet - low sodium heart healthy   Complete by: As directed    Discharge instructions   Complete by: As directed    Follow up with PCP in 7-10 days. Keep all scheduled appointments with GI and oncology.   Increase activity slowly   Complete by: As directed      Allergies as of 08/07/2018      Reactions   Clindamycin/lincomycin Itching, Rash   All-over itching; required a course of Prednisone to treat   Levofloxacin Nausea Only, Other (See Comments)   Insomnia   Fish Oil    Other reaction(s): Other (See Comments) Stomach upset    Nsaids    Other  reaction(s): Other (See Comments) Gi upset      Medication List    STOP taking these medications   sulfamethoxazole-trimethoprim 400-80 MG tablet Commonly known as: BACTRIM     TAKE these medications   acetaminophen 500 MG tablet Commonly known as: TYLENOL Take 1,000 mg by mouth daily as needed for moderate pain or headache.   allopurinol 300 MG tablet Commonly known as: ZYLOPRIM Take 300 mg by mouth daily.   aspirin 81 MG tablet Take 81 mg by mouth daily.    camphor-phenol 10.8-4.7 % Liqd Commonly known as: CAMPHO-PHENIQUE Apply 1 application topically daily as needed (fever blisters).   cholecalciferol 25 MCG (1000 UT) tablet Commonly known as: VITAMIN D3 Take 1,000 Units by mouth daily.   clotrimazole-betamethasone cream Commonly known as: LOTRISONE Apply 1 application topically 2 (two) times daily. Apply to both feet   dexamethasone 4 MG tablet Commonly known as: DECADRON Take 4 mg by mouth See admin instructions. Take 30 mins before chemotherapy, then take 2 days after chemotherapy   Digestive Enzyme Caps Take 2 capsules by mouth 3 (three) times daily with meals.   diphenhydrAMINE 12.5 MG/5ML liquid Commonly known as: BENADRYL Take 12.5-25 mg by mouth daily as needed for allergies.   dronabinol 2.5 MG capsule Commonly known as: MARINOL Take 2.5 mg by mouth See admin instructions. Take 2.5 mg 2 hours before dinner   hydrocortisone cream 1 % Apply 1 application topically as needed (for itching on ears or feet).   lisinopril 40 MG tablet Commonly known as: ZESTRIL Take 1 tablet (40 mg total) by mouth daily.   LORazepam 0.5 MG tablet Commonly known as: ATIVAN Take 0.5 mg by mouth every 6 (six) hours as needed for anxiety.   Magnesium Oxide 400 (240 Mg) MG Tabs Take 2 tablets (800 mg total) by mouth 2 (two) times daily. What changed:   how much to take  when to take this   metFORMIN 1000 MG tablet Commonly known as: GLUCOPHAGE Take 1,000 mg by mouth 2 (two) times daily with a meal.   methocarbamol 500 MG tablet Commonly known as: ROBAXIN Take 500 mg by mouth 4 (four) times daily as needed for muscle spasms.   metoprolol tartrate 50 MG tablet Commonly known as: LOPRESSOR TAKE 1 TABLET BY MOUTH TWICE A DAY   multivitamin with minerals Tabs tablet Take 1 tablet by mouth every morning.   mupirocin ointment 2 % Commonly known as: BACTROBAN Place 1 application into the nose daily as needed (wound care).    neomycin-bacitracin-polymyxin [phone] ointment Apply 1 application topically 2 (two) times daily as needed (FOR FOOT IRRITATION).   nitroGLYCERIN 0.4 MG SL tablet Commonly known as: NITROSTAT Place 1 tablet (0.4 mg total) under the tongue every 5 (five) minutes as needed for chest pain.   polyethylene glycol 17 g packet Commonly known as: MIRALAX / GLYCOLAX Take 17 g by mouth daily.   predniSONE 1 MG tablet Commonly known as: DELTASONE Take 4 tablets (4 mg total) by mouth daily with breakfast. What changed:   how much to take  Another medication with the same name was removed. Continue taking this medication, and follow the directions you see here.   prochlorperazine 10 MG tablet Commonly known as: COMPAZINE Take 10 mg by mouth every 6 (six) hours as needed for nausea.   senna 8.6 MG Tabs tablet Commonly known as: SENOKOT Take 2 tablets by mouth daily.   simvastatin 40 MG tablet Commonly known as: ZOCOR Take 40 mg  by mouth daily.   sitaGLIPtin 100 MG tablet Commonly known as: JANUVIA Take 100 mg by mouth daily.   tamsulosin 0.4 MG Caps capsule Commonly known as: FLOMAX Take 0.4 mg by mouth daily.   vitamin B-12 1000 MCG tablet Commonly known as: CYANOCOBALAMIN Take 1,000 mcg by mouth daily.   vitamin C 1000 MG tablet Take 1,000 mg by mouth every morning.   vitamin E 400 UNIT capsule Take 400 Units by mouth daily.      Allergies  Allergen Reactions  . Clindamycin/Lincomycin Itching and Rash    All-over itching; required a course of Prednisone to treat  . Levofloxacin Nausea Only and Other (See Comments)    Insomnia  . Fish Oil     Other reaction(s): Other (See Comments) Stomach upset   . Nsaids     Other reaction(s): Other (See Comments) Gi upset    The results of significant diagnostics from this hospitalization (including imaging, microbiology, ancillary and laboratory) are listed below for reference.    Significant Diagnostic Studies: Dg Ercp   Result Date: 08/07/2018 CLINICAL DATA:  73 year old male undergoing ERCP EXAM: ERCP TECHNIQUE: Multiple spot images obtained with the fluoroscopic device and submitted for interpretation post-procedure. FLUOROSCOPY TIME:  Fluoroscopy Time:  4 minutes 18 seconds Radiation Exposure Index (if provided by the fluoroscopic device): 39.8 mGy COMPARISON:  Prior ERCP 05/01/2018 FINDINGS: A total of 4 intraoperative spot images are submitted for review. The initial image demonstrates a plastic biliary stent in situ with a flexible endoscope in the descending duodenum. Subsequent images demonstrate cannulation of the right intrahepatic ducts, removal of the tube and cholangiogram. 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 On the final image, a metallic biliary stent has been placed. IMPRESSION: ERCP and stent placement as above. These images were submitted for radiologic interpretation only. Please see the procedural report for the amount of contrast and the fluoroscopy time utilized. Electronically Signed   By: Jacqulynn Cadet M.D.   On: 08/07/2018 11:12    Microbiology: Recent Results (from the past 240 hour(s))  SARS Coronavirus 2 (CEPHEID - Performed in [facility] hospital lab), Hosp Order     Status: None   Collection Time: 08/06/18  1:46 PM   Specimen: Nasopharyngeal Swab  Result Value Ref Range Status   SARS Coronavirus 2 NEGATIVE NEGATIVE Final    Comment: (NOTE) If result is NEGATIVE SARS-CoV-2 target nucleic acids are NOT DETECTED. The SARS-CoV-2 RNA is generally detectable in upper and lower  respiratory specimens during the acute phase of infection. The lowest  concentration of SARS-CoV-2 viral copies this assay can detect is 250  copies / mL. A negative result does not preclude SARS-CoV-2 infection  and should not be used as the sole basis for treatment or other  patient management decisions.  A negative result may occur with  improper specimen  collection / handling, submission of specimen other  than nasopharyngeal swab, presence of viral mutation(s) within the  areas targeted by this assay, and inadequate number of viral copies  (<250 copies / mL). A negative result must be combined with clinical  observations, patient history, and epidemiological information. If result is POSITIVE SARS-CoV-2 target nucleic acids are DETECTED. The SARS-CoV-2 RNA is generally detectable in upper and lower  respiratory specimens dur ing the acute phase of infection.  Positive  results are indicative of active infection with SARS-CoV-2.  Clinical  correlation with patient history and other diagnostic information is  necessary  to determine patient infection status.  Positive results do  not rule out bacterial infection or co-infection with other viruses. If result is PRESUMPTIVE POSTIVE SARS-CoV-2 nucleic acids MAY BE PRESENT.   A presumptive positive result was obtained on the submitted specimen  and confirmed on repeat testing.  While 2019 novel coronavirus  (SARS-CoV-2) nucleic acids may be present in the submitted sample  additional confirmatory testing may be necessary for epidemiological  and / or clinical management purposes  to differentiate between  SARS-CoV-2 and other Sarbecovirus currently known to infect humans.  If clinically indicated additional testing with an alternate test  methodology [id number]) is advised. The SARS-CoV-2 RNA is generally  detectable in upper and lower respiratory sp ecimens during the acute  phase of infection. The expected result is Negative. Fact Sheet for Patients:  StrictlyIdeas.no Fact Sheet for Healthcare Providers: BankingDealers.co.za This test is not yet approved or cleared by the [place] FDA and has been authorized for detection and/or diagnosis of SARS-CoV-2 by FDA under an Emergency Use Authorization (EUA).  This EUA will remain in effect  (meaning this test can be used) for the duration of the COVID-19 declaration under Section 564(b)(1) of the Act, 21 U.S.C. section 360bbb-3(b)(1), unless the authorization is terminated or revoked sooner. Performed at [facility], [place] [address] [place], [place] [address]   MRSA PCR Screening     Status: None   Collection Time: 08/06/18  5:44 PM   Specimen: Nasal Mucosa; Nasopharyngeal  Result Value Ref Range Status   MRSA by PCR NEGATIVE NEGATIVE Final    Comment:        The GeneXpert MRSA Assay (FDA approved for NASAL specimens only), is one component of a comprehensive MRSA colonization surveillance program. It is not intended to diagnose MRSA infection nor to guide or monitor treatment for MRSA infections. Performed at [facility], [place] [address], [place], [place] [address]      Labs: Basic Metabolic Panel: Recent Labs  Lab 08/06/18 1405 08/07/18 1214  NA 132* 134*  K 4.2 3.4*  CL 97* 104  CO2 23 25  GLUCOSE 110* 128*  BUN 41* 25*  CREATININE 0.82 0.63  CALCIUM 8.9 7.8*   Liver Function Tests: Recent Labs  Lab 08/06/18 1405 08/07/18 1214  AST 79* 41  ALT 228* 147*  ALKPHOS 885* 708*  BILITOT 1.3* 1.0  PROT 6.1* 5.6*  ALBUMIN 2.8* 2.6*   Recent Labs  Lab 08/06/18 1405  LIPASE 70*   No results for input(s): AMMONIA in the last 168 hours. CBC: Recent Labs  Lab 08/06/18 1405 08/07/18 1214  WBC 2.4* 1.8*  NEUTROABS 1.8  --   HGB 8.9* 8.4*  HCT 28.2* 25.9*  MCV 98.3 97.7  PLT 69* 52*   Cardiac Enzymes: No results for input(s): CKTOTAL, CKMB, CKMBINDEX, TROPONINI in the last 168 hours. BNP: BNP (last 3 results) No results for input(s): BNP in the last 8760 hours.  ProBNP (last 3 results) No results for input(s): PROBNP in the last 8760 hours.  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  Signed:        Rhonda Linan, DO [facility]  08/08/2018, 7:28 AM

## 2018-08-10 ENCOUNTER — Encounter (HOSPITAL_COMMUNITY): Payer: Medicare Other

## 2018-08-11 ENCOUNTER — Other Ambulatory Visit (HOSPITAL_COMMUNITY): Admission: RE | Admit: 2018-08-11 | Payer: Medicare Other | Source: Ambulatory Visit

## 2018-08-12 ENCOUNTER — Encounter (HOSPITAL_COMMUNITY): Payer: Medicare Other

## 2018-08-14 ENCOUNTER — Encounter (HOSPITAL_COMMUNITY): Payer: Medicare Other

## 2018-08-14 ENCOUNTER — Encounter (HOSPITAL_COMMUNITY): Payer: Self-pay

## 2018-08-14 ENCOUNTER — Ambulatory Visit (HOSPITAL_COMMUNITY): Admit: 2018-08-14 | Payer: Medicare Other | Admitting: Gastroenterology

## 2018-08-14 SURGERY — ENDOSCOPIC RETROGRADE CHOLANGIOPANCREATOGRAPHY (ERCP) WITH PROPOFOL
Anesthesia: General

## 2018-08-17 ENCOUNTER — Encounter (HOSPITAL_COMMUNITY): Payer: Medicare Other

## 2018-08-19 ENCOUNTER — Encounter (HOSPITAL_COMMUNITY): Payer: Medicare Other

## 2018-08-20 ENCOUNTER — Other Ambulatory Visit: Payer: Self-pay | Admitting: Cardiovascular Disease

## 2018-08-21 ENCOUNTER — Encounter (HOSPITAL_COMMUNITY): Payer: Medicare Other

## 2018-08-24 ENCOUNTER — Encounter (HOSPITAL_COMMUNITY): Payer: Medicare Other

## 2018-08-26 ENCOUNTER — Telehealth (HOSPITAL_COMMUNITY): Payer: Self-pay | Admitting: *Deleted

## 2018-08-26 ENCOUNTER — Encounter (HOSPITAL_COMMUNITY): Payer: Medicare Other

## 2018-08-26 NOTE — Telephone Encounter (Signed)
Called to update patient on the status of [facility].  At this time they remain on hold due to COVID-19 pandemic precautions.

## 2018-08-31 ENCOUNTER — Encounter (HOSPITAL_COMMUNITY): Payer: Medicare Other

## 2018-09-02 ENCOUNTER — Encounter (HOSPITAL_COMMUNITY): Payer: Medicare Other

## 2018-09-04 ENCOUNTER — Encounter (HOSPITAL_COMMUNITY): Payer: Medicare Other

## 2018-09-07 ENCOUNTER — Encounter (HOSPITAL_COMMUNITY): Payer: Medicare Other

## 2018-09-09 ENCOUNTER — Encounter (HOSPITAL_COMMUNITY): Payer: Medicare Other

## 2018-09-11 ENCOUNTER — Encounter (HOSPITAL_COMMUNITY): Payer: Medicare Other

## 2018-09-14 ENCOUNTER — Encounter (HOSPITAL_COMMUNITY): Payer: Medicare Other

## 2018-09-16 ENCOUNTER — Encounter (HOSPITAL_COMMUNITY): Payer: Medicare Other

## 2018-09-18 ENCOUNTER — Encounter (HOSPITAL_COMMUNITY): Payer: Medicare Other

## 2018-09-21 ENCOUNTER — Encounter (HOSPITAL_COMMUNITY): Payer: Medicare Other

## 2018-09-23 ENCOUNTER — Encounter (HOSPITAL_COMMUNITY): Payer: Medicare Other

## 2018-09-24 ENCOUNTER — Telehealth: Payer: Self-pay | Admitting: Physician Assistant

## 2018-09-24 ENCOUNTER — Ambulatory Visit (INDEPENDENT_AMBULATORY_CARE_PROVIDER_SITE_OTHER): Payer: Medicare Other | Admitting: Cardiology

## 2018-09-24 ENCOUNTER — Other Ambulatory Visit: Payer: Self-pay

## 2018-09-24 ENCOUNTER — Telehealth: Payer: Self-pay | Admitting: Radiology

## 2018-09-24 ENCOUNTER — Encounter: Payer: Self-pay | Admitting: Cardiology

## 2018-09-24 VITALS — BP 133/86 | HR 98 | Ht 69.0 in | Wt 140.0 lb

## 2018-09-24 DIAGNOSIS — I493 Ventricular premature depolarization: Secondary | ICD-10-CM

## 2018-09-24 DIAGNOSIS — I739 Peripheral vascular disease, unspecified: Secondary | ICD-10-CM | POA: Diagnosis not present

## 2018-09-24 DIAGNOSIS — I495 Sick sinus syndrome: Secondary | ICD-10-CM

## 2018-09-24 DIAGNOSIS — I779 Disorder of arteries and arterioles, unspecified: Secondary | ICD-10-CM

## 2018-09-24 NOTE — Telephone Encounter (Signed)
New Message    Patient's daughter thinks he should come in sooner than 8/26 and there's no open appt's on any of the schedules.  Patient's daughter would like to speak to a nurse.

## 2018-09-24 NOTE — Telephone Encounter (Signed)
   Appointment-July 93,8101  COVID-19 Pre-Screening Questions:  . In the past 7 to 10 days have you had a cough,  shortness of breath, headache, congestion, fever (100 or greater) body aches, chills, sore throat, or sudden loss of taste or sense of smell? no . Have you been around anyone with known Covid 19. no . Have you been around anyone who is awaiting Covid 19 test results in the past 7 to 10 days? no . Have you been around anyone who has been exposed to Covid 19, or has mentioned symptoms of Covid 19 within the past 7 to 10 days? no  If you have any concerns/questions about symptoms patients report during screening (either on the phone or at threshold). Contact the provider seeing the patient or DOD for further guidance.  If neither are available contact a member of the leadership team.   I spoke with pt's daughter who answered above questions for pt.  Pt is unsteady on feet and cannot walk without assistance.  He also has trouble remembering instructions.  Daughter may need to accompany pt depending on how he is feeling upon arrival to appointment.  Daughter answers no to screening questions as well. Daughter aware she and pt will need to wear mask.  She is aware pt should arrive 15 minutes prior to appointment.   Pt's daughter reports pt's heart rate has been running lower than normal lately.  Running in 50's.  Having more PVC's.  Is undergoing chemotherapy and daughter reports oncologist wanted pt seen by cardiology as soon as possible.  I scheduled pt to see B. Rosita Fire, [place] today at 3:10

## 2018-09-24 NOTE — Telephone Encounter (Signed)
Enrolled patient for a 3 Day Zio monitor to be mailed. Info was verified at check out and patient knows to expect monitor to arrive in 3-4 days.

## 2018-09-24 NOTE — Progress Notes (Signed)
09/24/2018 Samuel Coleman   Dec 26, 1945  [id number]  Primary Physician Jani Gravel, MD Primary Cardiologist: Lauree Chandler, MD  Electrophysiologist: None   Reason for Visit/CC: bradycardia and PVCs  HPI:  Samuel Coleman is a 73 y.o. male who is being seen today for the evaluation of bradycardia and PVCs. He is followed by Dr. Angelena Form. Has history of CAD, HLD, HTN, thoracic aortic aneurysm and carotid artery disease.  He was admitted to [facility] November 2011 with an inferior STEMI. Emergent cardiac cath showed severe three vessel CAD. He underwent 3V CABG later that day with Dr. Cyndia Bent. (LIMA to LAD, SVG to OM, SVG to RCA). He was admitted to [facility] August 2013 for abdominal pain and felt to have ischemic colitis and his beta blocker dose was reduced. He also had some bradycardia. He was seen by Truitt Merle, NP in January 2014 and was doing well but c/o skipped beats. Event monitor with PVCs, NSR. Stress myoview without ischemia April 2014. Stress myoview February 2018 showed no ischemia. Echo October 2018 with normal LV function, LVEF=55-60%. No valve disease. Thoracic aortic aneurysm stable by CTA October 2018. Carotid dopplers with mild bilateral disease February 2018. He was seen in our office 12/23/17 by Melina Copa, PA-C with c/o decreased exercise tolerance. PVCs noted in cardiac rehab. He described recent persistent recurrent swelling of his ear initially treated as infection with rounds of doxycycline and possible MRA. 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 He was later diagnosed with Wegener's granulomatosis. He has been on several rounds of prednisone. He has also been diagnosed with diabetes and is on metformin and Januvia.  His PVCs have been a big issue for him. He has had symptoms of fatigue associated with PVCS. In 2019,  his metoprolol was stopped and he was  started on acebutolol for PVC suppression. Nuclear stress test 01/08/18 with no ischemia. LVEF=71%. Echo 01/08/18 with LVEF=60-65%. Moderate LVH. PVCs noted during study. No improvement on acebutolol so he was recommended to start on atenolol 50 mg daily but he did not start atenolol. He restarted his metoprolol and was feeling better. At last visit w/ Dr. Gonzella Lex in Nov 2019, metoprolol 50 mg BID was continued.    Unfortunately, he was just recently diagnosed with cancer in March and started chemo in April. He is being treated for cholangiocarcinoma at [facility]. On gemcitabine/ cisplatin.   Pt's daughter called the office and spoke with triage RN today regarding recent issues. Pt's daughter reports pt's heart rate has been running lower than normal lately.  Running in 50's.  Having more PVC's.  He saw his oncologist yesterday who wanted pt seen by cardiology as soon as possible.  Pt's HR at home this morning was in the 80s. EKG shows NSR with 1 PVC. HR 98 bpm. BP 133/86. He states his low pulse rate was 2 days ago. He is feeling better today after getting IVFs at [facility] yesterday, but he has been feeling more tired and fatigue. No dyspnea, dizziness, syncope/ near syncope. No CP.      Current Meds  Medication Sig   acetaminophen (TYLENOL) 500 MG tablet Take 1,000 mg by mouth daily as needed for moderate pain or headache.   allopurinol (ZYLOPRIM) 300 MG tablet Take 300 mg by mouth daily.   Ascorbic Acid (VITAMIN C) 1000 MG tablet Take 1,000 mg by mouth every morning.    aspirin 81 MG tablet  Take 81 mg by mouth daily.   camphor-phenol (CAMPHO-PHENIQUE) 10.8-4.7 % LIQD Apply 1 application topically daily as needed (fever blisters).   cholecalciferol (VITAMIN D3) 25 MCG (1000 UT) tablet Take 1,000 Units by mouth daily.   clotrimazole-betamethasone (LOTRISONE) cream Apply 1 application topically 2 (two) times daily. Apply to both feet   dexamethasone (DECADRON) 4 MG tablet Take 4 mg by mouth See  admin instructions. Take 30 mins before chemotherapy, then take 2 days after chemotherapy   DEXILANT 60 MG capsule Take 60 mg by mouth daily.   Digestive Enzyme CAPS Take 2 capsules by mouth 3 (three) times daily with meals.   diphenhydrAMINE (BENADRYL) 12.5 MG/5ML liquid Take 12.5-25 mg by mouth daily as needed for allergies.   dronabinol (MARINOL) 2.5 MG capsule Take 2.5 mg by mouth See admin instructions. Take 2.5 mg 2 hours before dinner   FLUoxetine (PROZAC) 10 MG capsule Take 10 mg by mouth 2 (two) times daily.   hydrocortisone cream 1 % Apply 1 application topically as needed (for itching on ears or feet).    lisinopril (PRINIVIL,ZESTRIL) 40 MG tablet Take 1 tablet (40 mg total) by mouth daily.   LORazepam (ATIVAN) 0.5 MG tablet Take 0.5 mg by mouth every 6 (six) hours as needed for anxiety.   magnesium oxide (MAG-OX) 400 MG tablet Take 400 mg by mouth 3 (three) times daily.   metFORMIN (GLUCOPHAGE) 1000 MG tablet Take 1,000 mg by mouth 2 (two) times daily with a meal.   methocarbamol (ROBAXIN) 500 MG tablet Take 500 mg by mouth 4 (four) times daily as needed for muscle spasms.    metoprolol tartrate (LOPRESSOR) 50 MG tablet TAKE 1 TABLET BY MOUTH TWICE A DAY   Multiple Vitamin (MULTIVITAMIN WITH MINERALS) TABS tablet Take 1 tablet by mouth every morning.    mupirocin ointment (BACTROBAN) 2 % Place 1 application into the nose daily as needed (wound care).   neomycin-bacitracin-polymyxin (NEOSPORIN) [id number] ointment Apply 1 application topically 2 (two) times daily as needed (FOR FOOT IRRITATION).    nitroGLYCERIN (NITROSTAT) 0.4 MG SL tablet Place 1 tablet (0.4 mg total) under the tongue every 5 (five) minutes as needed for chest pain.   polyethylene glycol (MIRALAX / GLYCOLAX) 17 g packet Take 17 g by mouth daily.   predniSONE (DELTASONE) 5 MG tablet Take 5 mg by mouth daily with breakfast.   prochlorperazine (COMPAZINE) 10 MG tablet Take 10 mg by mouth every 6 (six)  hours as needed for nausea.   senna (SENOKOT) 8.6 MG TABS tablet Take 2 tablets by mouth daily.   simvastatin (ZOCOR) 40 MG tablet Take 40 mg by mouth daily.    sitaGLIPtin (JANUVIA) 100 MG tablet Take 100 mg by mouth daily.   tamsulosin (FLOMAX) 0.4 MG CAPS capsule Take 0.4 mg by mouth daily.   vitamin B-12 (CYANOCOBALAMIN) 1000 MCG tablet Take 1,000 mcg by mouth daily.   vitamin E 400 UNIT capsule Take 400 Units by mouth daily.   Allergies  Allergen Reactions   Clindamycin/Lincomycin Itching and Rash    All-over itching; required a course of Prednisone to treat   Levofloxacin Nausea Only and Other (See Comments)    Insomnia   Fish Oil     Other reaction(s): Other (See Comments) Stomach upset    Nsaids     Other reaction(s): Other (See Comments) Gi upset   Past Medical History:  Diagnosis Date   Anxiety    Arthritis    Asthma    exercise induced  Back pain, chronic    gets injections in back   CAD (coronary artery disease)    a.  prior inferior MI; s/p 3V CABG 01/01/10 (LIMA to LAD, SVG to OM, SVG to RCA- Dr.Bartle);  b. Last Myoview (05/2012):  Normal; EF 70% // c. Myoview 2/18: EF 60, no ischemia or infarction, Normal Study   Carotid stenosis    Carotid US 03/2016 1-39% BICA   Diabetes mellitus type 2 in nonobese (HCC)    Frequent PVCs    GERD (gastroesophageal reflux disease)    Gout    History of kidney stones    no problems with stones   Hx of echocardiogram    Echo (01/2010):  EF 55-60%, ? Apical HK, mild BAE, atrial septal aneurysm.   Hyperlipidemia    Hypertension    Ischemic colitis ([place])    noted on CT back in August 2013 possible related to hypotension   Pneumonia    x 2   Premature atrial contractions    PUD (peptic ulcer disease) 1974   ST elevation myocardial infarction (STEMI) of inferior wall ([place]) 2011   s/p CABG 2011   Thoracic aortic aneurysm ([place])    a. last assessed 11/2017 3.6x3.3cm.   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)    Family History  Problem Relation Age of Onset   Heart failure Mother    Pneumonia Father    Lung cancer Sister    Esophageal cancer Sister    Pancreatic cancer Maternal Grandmother    Past Surgical History:  Procedure Laterality Date   BILIARY STENT PLACEMENT N/A 04/21/2018   Procedure: BILIARY STENT PLACEMENT;  Surgeon: Carol Ada, MD;  Location: [facility];  Service: Endoscopy;  Laterality: N/A;   BILIARY STENT PLACEMENT N/A 05/01/2018   Procedure: BILIARY STENT PLACEMENT;  Surgeon: Carol Ada, MD;  Location: [facility];  Service: Endoscopy;  Laterality: N/A;   BILIARY STENT PLACEMENT N/A 08/07/2018   Procedure: BILIARY STENT PLACEMENT;  Surgeon: Carol Ada, MD;  Location: [facility];  Service: Endoscopy;  Laterality: N/A;   CARDIAC CATHETERIZATION     CHEST TUBE INSERTION  1992   fx lt ribs-collapsed lung   CORONARY ARTERY BYPASS GRAFT  11/11   X 3 VESSELS   cyst removed from back of neck     cyst removel on right buttocks - 2014     EAR CYST EXCISION Left 09/02/2012   Procedure: EXCISION SEBACEOUS CYST LEFT POSTERIOR LEG;  Surgeon: Pedro Earls, MD;  Location: [place];  Service: General;  Laterality: Left;   ENDOSCOPIC RETROGRADE CHOLANGIOPANCREATOGRAPHY (ERCP) WITH PROPOFOL N/A 04/21/2018   Procedure: ENDOSCOPIC RETROGRADE CHOLANGIOPANCREATOGRAPHY (ERCP) WITH PROPOFOL;  Surgeon: Carol Ada, MD;  [facility]: [facility];  Service: Endoscopy;  Laterality: N/A;   ENDOSCOPIC RETROGRADE CHOLANGIOPANCREATOGRAPHY (ERCP) WITH PROPOFOL N/A 05/01/2018   Procedure: ENDOSCOPIC RETROGRADE CHOLANGIOPANCREATOGRAPHY (ERCP) WITH PROPOFOL;  Surgeon: Carol Ada, MD;  [facility]: [facility];  Service: Endoscopy;  Laterality: N/A;   ERCP N/A 08/07/2018   Procedure: ENDOSCOPIC RETROGRADE CHOLANGIOPANCREATOGRAPHY (ERCP);  Surgeon: Carol Ada, MD;  Location: [facility];  Service: Endoscopy;   Laterality: N/A;   Excision of deep left neck mass. SURGEON:  Leonides Sake. Lucia Gaskins, M.D.  1995   EYE SURGERY  both cararacts   [place] Right 12/02/2012   Procedure: OPEN RIGHT INGUINAL HERINA;  Surgeon: Pedro Earls, MD;  Location: [facility];  Service: General;  Laterality: Right;   INGUINAL  HERNIA REPAIR Left 11/02/2014   Procedure: OPEN LEFT INGUINAL HERNIA REPAIR WITH MESH;  Surgeon: Johnathan Hausen, MD;  [facility]: [facility];  Service: General;  Laterality: Left;   INSERTION OF MESH Right 12/02/2012   Procedure: INSERTION OF MESH;  Surgeon: Pedro Earls, MD;  [facility]: [facility];  Service: General;  Laterality: Right;   LUMBAR LAMINECTOMY/DECOMPRESSION MICRODISCECTOMY N/A 01/26/2015   Procedure: Lumbar 5-sacrum 1 decompression;  Surgeon: Phylliss Bob, MD;  Location: [place];  Service: Orthopedics;  Laterality: N/A;   SPHINCTEROTOMY  04/21/2018   Procedure: SPHINCTEROTOMY;  Surgeon: Carol Ada, MD;  Location: [facility];  Service: Endoscopy;;   SPHINCTEROTOMY  05/01/2018   Procedure: Joan Mayans;  Surgeon: Carol Ada, MD;  Location: [facility];  Service: Endoscopy;;   SPYGLASS CHOLANGIOSCOPY N/A 05/01/2018   Procedure: NUUVOZDG CHOLANGIOSCOPY;  Surgeon: Carol Ada, MD;  [facility];  Service: Endoscopy;  Laterality: N/A;   STENT REMOVAL  05/01/2018   Procedure: STENT REMOVAL;  Surgeon: Carol Ada, MD;  [facility];  Service: Endoscopy;;   STENT REMOVAL  08/07/2018   Procedure: STENT REMOVAL;  Surgeon: Carol Ada, MD;  [facility];  Service: Endoscopy;;   TONSILLECTOMY     Social History   Socioeconomic History   Marital status: Widowed    Spouse name: Not on file   Number of children: Not on file   Years of education: Not on file   Highest education level: Not on file  Occupational History   Not on file  Social Needs   Financial resource strain: Not on file   Food insecurity    Worry:  Not on file    Inability: Not on file   Transportation needs    Medical: Not on file    Non-medical: Not on file  Tobacco Use   Smoking status: Former Smoker    Quit date: 02/25/1974    Years since quitting: 44.6   Smokeless tobacco: Never Used  Substance and Sexual Activity   Alcohol use: Yes    Comment: rarely   Drug use: No   Sexual activity: Not on file  Lifestyle   Physical activity    Days per week: Not on file    Minutes per session: Not on file   Stress: Not on file  Relationships   Social connections    Talks on phone: Not on file    Gets together: Not on file    Attends religious service: Not on file    Active member of club or organization: Not on file    Attends meetings of clubs or organizations: Not on file    Relationship status: Not on file   Intimate partner violence    Fear of current or ex partner: Not on file    Emotionally abused: Not on file    Physically abused: Not on file    Forced sexual activity: Not on file  Other Topics Concern   Not on file  Social History Narrative   Not on file     Lipid Panel  No results found for: CHOL, TRIG, HDL, CHOLHDL, VLDL, LDLCALC, LDLDIRECT  Review of Systems: General: negative for chills, fever, night sweats or weight changes.  Cardiovascular: negative for chest pain, dyspnea on exertion, edema, orthopnea, palpitations, paroxysmal nocturnal dyspnea or shortness of breath Dermatological: negative for rash Respiratory: negative for cough or wheezing Urologic: negative for hematuria Abdominal: negative for nausea, vomiting, diarrhea, bright red blood per rectum, melena, or hematemesis Neurologic: negative for visual changes,  syncope, or dizziness All other systems reviewed and are otherwise negative except as noted above.   Physical Exam:  Blood pressure 133/86, pulse 98, height 5\' 9"  (1.753 m), weight 140 lb (63.5 kg), SpO2 98 %.  General appearance: alert, cooperative and no distress Neck: no  carotid bruit and no JVD Lungs: clear to auscultation bilaterally Heart: regular rate and rhythm, S1, S2 normal, no murmur, click, rub or gallop Extremities: extremities normal, atraumatic, no cyanosis or edema Pulses: 2+ and symmetric Skin: Skin color, texture, turgor normal. No rashes or lesions Neurologic: Grossly normal  EKG NSR w/ PVC 98 bpm  -- personally reviewed   ASSESSMENT AND PLAN:    1. PVCs: he endorses more frequent palpitations recently and more fatigue. He is on metoprolol. Will continue current dose, 50 mg BID. Will order 72 hr Holter to assess PVC burden. Update echo.   2. Bradycardia: fluctuating HR was in the 50s the other day and in the 80s-90s today. Has had more fatigue. Low pulse rate may have been 2/2 PVCs. Will get short term Holter monitor to better evaluate. Will further advise on metoprolol dose based on results.   3. CAD: s/p CABG in 2011. Nuclear stress test 01/08/18 showed no ischemia.  He denies CP. Continue medical therapy.    4. Thoracic Aortic Aneurysm: CTA October 2019 with stable aneurysm in the proximal descending thoracic aorta.   5. Carotid Artery Disease: Mild disease by dopplers march 2019. No dizziness or stroke like symptoms. No plans to repeat at this time. Consider repeating dopplers again next year.   6. HLD: on statin therapy. Lipid profile followed by PCP.   7. Cholangiocarcinoma: recently diagnosed in March and started chemo in April at [facility]. On gemcitabine/ cisplatin. Per [employer], neither chemotherapy agents is associated with cardiotoxicity, however given is worsening palpitations and frequent PVCs, we will update echo to reassess LVF.    Follow-Up: keep f/u w/ Ermalinda Barrios, PA-C 8/26.   Ankith Edmonston Ladoris Gene, MHS [facility] 09/24/2018 4:33 PM

## 2018-09-24 NOTE — Patient Instructions (Signed)
Medication Instructions:  none If you need a refill on your cardiac medications before your next appointment, please call your pharmacy.   Lab work: none If you have labs (blood work) drawn today and your tests are completely normal, you will receive your results only by: Marland Kitchen MyChart Message (if you have MyChart) OR . A paper copy in the mail If you have any lab test that is abnormal or we need to change your treatment, we will call you to review the results.  Testing/Procedures: Your physician has requested that you have an echocardiogram. Echocardiography is a painless test that uses sound waves to create images of your heart. It provides your doctor with information about the size and shape of your heart and how well your heart's chambers and valves are working. This procedure takes approximately one hour. There are no restrictions for this procedure.  3 Day ZIO Monitor   Follow-Up: At [facility], you and your health needs are our priority.  As part of our continuing mission to provide you with exceptional heart care, we have created designated Provider Care Teams.  These Care Teams include your primary Cardiologist (physician) and Advanced Practice Providers (APPs -  Physician Assistants and Nurse Practitioners) who all work together to provide you with the care you need, when you need it. .   Any Other Special Instructions Will Be Listed Below (If Applicable).

## 2018-09-25 ENCOUNTER — Encounter (HOSPITAL_COMMUNITY): Payer: Medicare Other

## 2018-10-02 ENCOUNTER — Other Ambulatory Visit (HOSPITAL_COMMUNITY): Payer: Medicare Other

## 2018-10-21 ENCOUNTER — Ambulatory Visit: Payer: Medicare Other | Admitting: Physician Assistant

## 2018-10-29 ENCOUNTER — Telehealth: Payer: Self-pay | Admitting: Cardiovascular Disease

## 2018-10-29 NOTE — Telephone Encounter (Signed)
° ° ° ° °  Pt c/o BP issue: STAT if pt c/o blurred vision, one-sided weakness or slurred speech  1. What are your last 5 BP readings? 98/62  2. Are you having any other symptoms (ex. Dizziness, headache, blurred vision, passed out)? NO  3. What is your BP issue? Daughter calling to report low BP. Patient currently doing chemo. Would like to decrease BP medication

## 2018-10-29 NOTE — Telephone Encounter (Signed)
I spoke with pt's daughter who reports pt's BP has been running low while doing chemotherapy and oncologist suggested she call cardiology to see if medications should be changed. Daughter reports systolic has been in mid to upper 90's. Since being seen in our office pt has been admitted to hospital.  Echo was done at [facility] and results are in Care everywhere.  I asked daughter if she could send recent BP readings for Dr. Angelena Form to review. She reports pt is currently in a rehab facility [facility]) and BP is monitored there.  I will contact rehab facility and request recent readings be sent to our office for review.  Phone number per daughter for nurses station is [phone]. Daughter states there is a physician that sees pt at the facility and I made daughter aware Dr. Angelena Form may want physician at facility to adjust medications since pt has not been seen here since hospitalization.  I placed call to number for nurses station and left message on the voicemail requesting recent BP readings be sent to our office. Fax number and phone number provided.

## 2018-11-16 MED ORDER — TRAMADOL HCL 50 MG PO TABS
50.00 | ORAL_TABLET | ORAL | Status: DC
Start: ? — End: 2018-11-16

## 2018-11-16 NOTE — Telephone Encounter (Signed)
Pt currently admitted to [facility].

## 2018-11-21 ENCOUNTER — Telehealth: Payer: Self-pay | Admitting: Cardiology

## 2018-11-21 NOTE — Telephone Encounter (Signed)
The patient was discharged from [facility] today after being treated for sepsis. He was treated with antibiotics and discharged home today.   His daughter reports that his HR has been 40s to 60s this evening. Is is currently 54 bpm. He has no lightheadedness or dizziness or shortness of breath. He has no fever.  His daughter was advised to hold his metoprolol for now. If he has worsening bradycardia or any new/different symptoms, they were advised to go to the hospital.

## 2018-11-23 ENCOUNTER — Telehealth: Payer: Self-pay | Admitting: Cardiovascular Disease

## 2018-11-23 NOTE — Telephone Encounter (Signed)
Thanks

## 2018-11-23 NOTE — Telephone Encounter (Signed)
STAT if HR is under 50 or over 120 (normal HR is 60-100 beats per minute)  1) What is your heart rate? 58  2) Do you have a log of your heart rate readings (document readings)?  yes  3) Do you have any other symptoms? Ankles swelling, nauseated and tired

## 2018-11-23 NOTE — Telephone Encounter (Signed)
I spoke with pt's daughter and told her we never received BP readings from [facility]. (see phone note from 10/29/18). Daughter reports pt's heart rate is 58 today.  Yesterday was his normal-90-100.  The day before his heart rate was 54 and he was instructed by provider to hold metoprolol for now. I told daughter to have pt hold metoprolol if heart rate less than 55.  She is concerned he may have new infection as initial sign of previous sepsis was low heart rate.  He does not have a fever.  I asked her to contact primary care to discuss infection concerns. Pt has follow up in our office on October 6,2020.

## 2018-11-23 NOTE — Telephone Encounter (Signed)
I will route to primary card nurse Gilford Rile RN for Dr. Angelena Form.

## 2018-11-30 DIAGNOSIS — R001 Bradycardia, unspecified: Secondary | ICD-10-CM | POA: Insufficient documentation

## 2018-11-30 DIAGNOSIS — I493 Ventricular premature depolarization: Secondary | ICD-10-CM | POA: Insufficient documentation

## 2018-11-30 NOTE — Progress Notes (Signed)
Cardiology Office Note    Date:  12/01/2018   ID:  LAZAVION FANA, DOB May 30, 1945, MRN [id number]  PCP:  Jani Gravel, MD  Cardiologist: Lauree Chandler, MD EPS: None  No chief complaint on file.   History of Present Illness:  GABREIL KILNER is a 73 y.o. male with history of CAD S/P inf STEMI 2011, emergency CABG LIMA to LAD, SVG-OM, SVG-RCA, NST 12/2017 no ischemia, echo 12/2017 LVEF 60-65% mod LVH, Thoracic aortic aneurysm, mild carotid disease, freq PVC's treated with acebutolol but no improvement so treated with metoprolol, Wegener's granulomatosis, DM.  LOV 09/24/18 increase in palpitations and slow HR in 50s with fatigue. 72 hr monitor and echo ordered but never done.. 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  Was in the hospital at [facility] for bradycardia and BP drops and diagnosed with sepsis. They stopped lisinopril and reduced metoprolol.Went home for 2 days then back in with pulmonary embolus started on eliquis. Went back in with another infection and just got home yesterday. Magnesium was very low and can't get it up. His daughter from [place], [place] is here taking care of him. His oncologists have given him 3 months if he doesn't do any more chemo but very weak now. Has lost over 100 lb in the past year. Hospice is coming tomorrow.  .  Past Medical History:  Diagnosis Date  . Anxiety   . Arthritis   . Asthma    exercise induced  . Back pain, chronic    gets injections in back  . CAD (coronary artery disease)    a.  prior inferior MI; s/p 3V CABG 01/01/10 (LIMA to LAD, SVG to OM, SVG to RCA- Dr.Bartle);  b. Last Myoview (05/2012):  Normal; EF 70% // c. Myoview 2/18: EF 60, no ischemia or infarction, Normal Study  . Carotid stenosis    Carotid US 03/2016 1-39% BICA  . Diabetes mellitus type 2 in nonobese (HCC)   . Frequent PVCs   . GERD (gastroesophageal reflux disease)   . Gout   . History of kidney stones    no problems  with stones  . Hx of echocardiogram    Echo (01/2010):  EF 55-60%, ? Apical HK, mild BAE, atrial septal aneurysm.  Marland Kitchen Hyperlipidemia   . Hypertension   . Ischemic colitis ([place])    noted on CT back in August 2013 possible related to hypotension  . Pneumonia    x 2  . Premature atrial contractions   . PUD (peptic ulcer disease) 1974  . ST elevation myocardial infarction (STEMI) of inferior wall ([place]) 2011   s/p CABG 2011  . Thoracic aortic aneurysm ([place])    a. last assessed 11/2017 3.6x3.3cm.  . 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 BILIARY STENT PLACEMENT N/A 04/21/2018   Procedure: BILIARY STENT PLACEMENT;  Surgeon: Carol Ada, MD;  Location: [facility];  Service: Endoscopy;  Laterality: N/A;  . BILIARY STENT PLACEMENT N/A 05/01/2018   Procedure: BILIARY STENT PLACEMENT;  Surgeon: Carol Ada, MD;  [facility];  Service: Endoscopy;  Laterality: N/A;  . BILIARY STENT PLACEMENT N/A 08/07/2018   Procedure: BILIARY STENT PLACEMENT;  Surgeon: Carol Ada, MD;  [facility];  Service: Endoscopy;  Laterality: N/A;  . CARDIAC CATHETERIZATION    . CHEST TUBE INSERTION  1992   fx lt ribs-collapsed lung  . CORONARY ARTERY BYPASS GRAFT  11/11  X 3 VESSELS  . cyst removed from back of neck    . cyst removel on right buttocks - 2014    . EAR CYST EXCISION Left 09/02/2012   Procedure: EXCISION SEBACEOUS CYST LEFT POSTERIOR LEG;  Surgeon: Pedro Earls, MD;  Location: [place];  Service: General;  Laterality: Left;  . ENDOSCOPIC RETROGRADE CHOLANGIOPANCREATOGRAPHY (ERCP) WITH PROPOFOL N/A 04/21/2018   Procedure: ENDOSCOPIC RETROGRADE CHOLANGIOPANCREATOGRAPHY (ERCP) WITH PROPOFOL;  Surgeon: Carol Ada, MD;  [facility];  Service: Endoscopy;  Laterality: N/A;  . ENDOSCOPIC RETROGRADE CHOLANGIOPANCREATOGRAPHY (ERCP) WITH PROPOFOL N/A 05/01/2018   Procedure:  ENDOSCOPIC RETROGRADE CHOLANGIOPANCREATOGRAPHY (ERCP) WITH PROPOFOL;  Surgeon: Carol Ada, MD;  [facility];  Service: Endoscopy;  Laterality: N/A;  . ERCP N/A 08/07/2018   Procedure: ENDOSCOPIC RETROGRADE CHOLANGIOPANCREATOGRAPHY (ERCP);  Surgeon: Carol Ada, MD;  [facility];  Service: Endoscopy;  Laterality: N/A;  . Excision of deep left neck mass. SURGEON:  Leonides Sake. Lucia Gaskins, M.D.  Cramer.Melena  . EYE SURGERY  both cararacts  . HERNIA REPAIR    . INGUINAL HERNIA REPAIR Right 12/02/2012   Procedure: OPEN RIGHT INGUINAL HERINA;  Surgeon: Pedro Earls, MD;  [facility];  Service: General;  Laterality: Right;  . INGUINAL HERNIA REPAIR Left 11/02/2014   Procedure: OPEN LEFT INGUINAL HERNIA REPAIR WITH MESH;  Surgeon: Johnathan Hausen, MD;  [facility];  Service: General;  Laterality: Left;  . INSERTION OF MESH Right 12/02/2012   Procedure: INSERTION OF MESH;  Surgeon: Pedro Earls, MD;  [facility];  Service: General;  Laterality: Right;  . LUMBAR LAMINECTOMY/DECOMPRESSION MICRODISCECTOMY N/A 01/26/2015   Procedure: Lumbar 5-sacrum 1 decompression;  Surgeon: Phylliss Bob, MD;  Location: [place];  Service: Orthopedics;  Laterality: N/A;  . SPHINCTEROTOMY  04/21/2018   Procedure: SPHINCTEROTOMY;  Surgeon: Carol Ada, MD;  Location[facility];  Service: Endoscopy;;  . Joan Mayans  05/01/2018   Procedure: Joan Mayans;  Surgeon: Carol Ada, MD;  Location: [facility];  Service: Endoscopy;;  . Bess Kinds CHOLANGIOSCOPY N/A 05/01/2018   Procedure: [id number] CHOLANGIOSCOPY;  Surgeon: Carol Ada, MD;  [facility];  Service: Endoscopy;  Laterality: N/A;  . STENT REMOVAL  05/01/2018   Procedure: STENT REMOVAL;  Surgeon: Carol Ada, MD;  [facility];  Service: Endoscopy;;  . Lavell Islam REMOVAL  08/07/2018   Procedure: STENT REMOVAL;  Surgeon: Carol Ada, MD;  [facility];  Service: Endoscopy;;  . TONSILLECTOMY      Current  Medications: Current Meds  Medication Sig  . acetaminophen (TYLENOL) 500 MG tablet Take 1,000 mg by mouth daily as needed for moderate pain or headache.  . allopurinol (ZYLOPRIM) 300 MG tablet Take 300 mg by mouth daily.  Marland Kitchen amoxicillin-clavulanate (AUGMENTIN) 875-125 MG tablet Take by mouth.  Marland Kitchen apixaban (ELIQUIS) 5 MG TABS tablet Take by mouth.  . Ascorbic Acid (VITAMIN C) 1000 MG tablet Take 1,000 mg by mouth every morning.   . camphor-phenol (CAMPHO-PHENIQUE) 10.8-4.7 % LIQD Apply 1 application topically daily as needed (fever blisters).  . carbidopa-levodopa (SINEMET IR) 25-100 MG tablet TAKE 1 TABLET BY MOUTH THREE TIMES A DAY  . cholecalciferol (VITAMIN D3) 25 MCG (1000 UT) tablet Take 1,000 Units by mouth daily.  . clotrimazole-betamethasone (LOTRISONE) cream Apply 1 application topically 2 (two) times daily. Apply to both feet  . DEXILANT 60 MG capsule Take 60 mg by mouth daily.  . Digestive Enzyme CAPS Take 2 capsules by mouth 3 (three) times daily with meals.  Marland Kitchen  diphenhydrAMINE (BENADRYL) 12.5 MG/5ML liquid Take 12.5-25 mg by mouth daily as needed for allergies.  Marland Kitchen dronabinol (MARINOL) 2.5 MG capsule Take 2.5 mg by mouth See admin instructions. Take 2.5 mg 2 hours before dinner  . hydrocortisone cream 1 % Apply 1 application topically as needed (for itching on ears or feet).   . LORazepam (ATIVAN) 0.5 MG tablet Take 0.5 mg by mouth every 6 (six) hours as needed for anxiety.  . magnesium oxide (MAG-OX) 400 MG tablet Take 2 tablets (800 mg total) by mouth 2 (two) times daily.  . Melatonin 3 MG TABS Take by mouth.  . metFORMIN (GLUCOPHAGE) 1000 MG tablet Take 1,000 mg by mouth 2 (two) times daily with a meal.  . mirtazapine (REMERON) 15 MG tablet Take by mouth.  . Multiple Vitamin (MULTIVITAMIN WITH MINERALS) TABS tablet Take 1 tablet by mouth every morning.   . mupirocin ointment (BACTROBAN) 2 % Place 1 application into the nose daily as needed (wound care).  .  neomycin-bacitracin-polymyxin (NEOSPORIN) [phone] ointment Apply 1 application topically 2 (two) times daily as needed (FOR FOOT IRRITATION).   Marland Kitchen nitroGLYCERIN (NITROSTAT) 0.4 MG SL tablet Place 1 tablet (0.4 mg total) under the tongue every 5 (five) minutes as needed for chest pain.  Marland Kitchen ondansetron (ZOFRAN) 8 MG tablet Take by mouth.  . oxyCODONE (OXY IR/ROXICODONE) 5 MG immediate release tablet   . polyethylene glycol (MIRALAX / GLYCOLAX) 17 g packet Take 17 g by mouth daily.  . predniSONE (DELTASONE) 5 MG tablet Take 5 mg by mouth daily with breakfast.  . senna (SENOKOT) 8.6 MG TABS tablet Take 2 tablets by mouth daily.  . simvastatin (ZOCOR) 40 MG tablet Take 40 mg by mouth daily.   . sitaGLIPtin (JANUVIA) 100 MG tablet Take 100 mg by mouth daily.  Marland Kitchen sulfamethoxazole-trimethoprim (BACTRIM DS) 800-160 MG tablet 1 tablet my mouth Monday, Wednesday, Friday  . tamsulosin (FLOMAX) 0.4 MG CAPS capsule Take 0.4 mg by mouth daily.  . traMADol (ULTRAM) 50 MG tablet Take by mouth.  . vitamin B-12 (CYANOCOBALAMIN) 1000 MCG tablet Take 1,000 mcg by mouth daily.  . vitamin E 400 UNIT capsule Take 400 Units by mouth daily.  . [DISCONTINUED] aspirin 81 MG tablet Take 81 mg by mouth daily.  . [DISCONTINUED] FLUoxetine (PROZAC) 10 MG capsule Take 10 mg by mouth 2 (two) times daily.  . [DISCONTINUED] magnesium oxide (MAG-OX) 400 MG tablet Take 400 mg by mouth 3 (three) times daily.  . [DISCONTINUED] metoprolol tartrate (LOPRESSOR) 50 MG tablet TAKE 1 TABLET BY MOUTH TWICE A DAY (Patient taking differently: Take 50 mg by mouth daily. )     Allergies:   Clindamycin/lincomycin, Levofloxacin, Fish oil, Methocarbamol, and Nsaids   Social History   Socioeconomic History  . Marital status: Widowed    Spouse name: Not on file  . Number of children: Not on file  . Years of education: Not on file  . Highest education level: Not on file  Occupational History  . Not on file  Social Needs  . Financial  resource strain: Not on file  . Food insecurity    Worry: Not on file    Inability: Not on file  . Transportation needs    Medical: Not on file    Non-medical: Not on file  Tobacco Use  . Smoking status: Former Smoker    Quit date: 02/25/1974    Years since quitting: 44.7  . Smokeless tobacco: Never Used  Substance and Sexual Activity  .  Alcohol use: Yes    Comment: rarely  . Drug use: No  . Sexual activity: Not on file  Lifestyle  . Physical activity    Days per week: Not on file    Minutes per session: Not on file  . Stress: Not on file  Relationships  . Social Herbalist on phone: Not on file    Gets together: Not on file    Attends religious service: Not on file    Active member of club or organization: Not on file    Attends meetings of clubs or organizations: Not on file    Relationship status: Not on file  Other Topics Concern  . Not on file  Social History Narrative  . Not on file     Family History:  The patient's   family history includes Esophageal cancer in his sister; Heart failure in his mother; Lung cancer in his sister; Pancreatic cancer in his maternal grandmother; Pneumonia in his father.   ROS:   Please see the history of present illness.    ROS All other systems reviewed and are negative.   PHYSICAL EXAM:   VS:  BP (!) 103/58   Pulse 80   Ht 5\' 9"  (1.753 m)   Wt 136 lb 3.2 oz (61.8 kg)   SpO2 98%   BMI 20.11 kg/m  initial BP 86/48 Physical Exam  GEN: Thin, elderly,  in no acute distress  Neck: no JVD, carotid bruits, or masses Cardiac:RRR; no murmurs, rubs, or gallops  Respiratory:  clear to auscultation bilaterally, normal work of breathing GI: soft, nontender, nondistended, + BS Ext: without cyanosis, clubbing, or edema, Good distal pulses bilaterally Neuro:  Alert and Oriented x 3, Psych: euthymic mood, full affect  Wt Readings from Last 3 Encounters:  12/01/18 136 lb 3.2 oz (61.8 kg)  09/24/18 140 lb (63.5 kg)  08/07/18  138 lb 0.1 oz (62.6 kg)      Studies/Labs Reviewed:   EKG:  EKG is not ordered today.    Recent Labs: 12/23/2017: TSH 0.357 01/20/2018: Magnesium 1.1 08/07/2018: ALT 147; BUN 25; Creatinine, Ser 0.63; Hemoglobin 8.4; Platelets 52; Potassium 3.4; Sodium 134   Lipid Panel No results found for: CHOL, TRIG, HDL, CHOLHDL, VLDL, LDLCALC, LDLDIRECT  Additional studies/ records that were reviewed today include:  10/2018 [facility]- TTE w/ normal EF, impaired diastolic relaxation, no evidence of pulm HTN or fluid  Echo 11/2019Study Conclusions   - Left ventricle: The cavity size was normal. Wall thickness was   increased in a pattern of moderate LVH. Systolic function was   normal. The estimated ejection fraction was in the range of 60%   to 65%. Wall motion was normal; there were no regional wall   motion abnormalities. The E/e&' ratio is between 8-15, suggesting   indeterminate LV filling pressure. - Left atrium: The atrium was normal in size. - Inferior vena cava: The vessel was normal in size. The   respirophasic diameter changes were in the normal range (= 50%),   consistent with normal central venous pressure.   Impressions:   - Compared to a prior study in 11/2016, the LVEF is higher at   60-65%. Frequent PVC&'s were noted.   ------------------------------------------------------------------- Labs, prior tests, procedures, and surgery: Coronary artery bypass grafting.   ------------------------------------------------------------------- Study data:  Comparison was made to the study of 12/05/2016.  Study status:  Routine.  Procedure:  The patient reported no pain pre or post test. Transthoracic  echocardiography. Image quality was adequate. The study was technically difficult, as a result of poor sound wave transmission. Intravenous contrast (Definity) was administered.  Study completion:  There were no complications.     Transthoracic echocardiography.  M-mode,  complete 2D, spectral Doppler, and color Doppler.  Birthdate:  Patient birthdate: 1946-02-01.  Age:  Patient is 73 yr old.  Sex:  Gender: male. BMI: 26.5 kg/m^2.  Blood pressure:     138/59  Patient status: Outpatient.  Study date:  Study date: 2020-11-2717. Study time: 10:26 AM.  Location:  [facility]   NST 11/2019Nuclear stress EF: 71%.  Normal perfusion No signifcant ischemia or scar.  This is a low risk study.     Chest CT 10/2019IMPRESSION: Chest Impression:   1. No evidence of aortic dissection. 2. 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   Abdomen / Pelvis Impression:   1. No evidence of dissection or aneurysm of the abdominal aorta or branches. 2. Mucosal enhancement of the extrahepatic bile ducts is nonspecific. Recommend clinical correlation for cholangitis. 3. Nonobstructing calculus in distal RIGHT ureter. 4. Bilateral nephrolithiasis. 5. LEFT inguinal hernia contains a loop of descending colon. No obstruction.     Electronically Signed   By: Suzy Bouchard M.D.   On: 12/03/2017 16:05 Contrast enhanced imaging demonstrates saccular aneurysm originating from the proximal descending thoracic aorta just past aortic arch. Aortic diameter at this aneurysm level measures 3.6 x 3.3 cm in total compared to 3.9 x 3.3 cm on prior for no change. There is calcification of the saccular aneurysm is wall (image 34/6). The ascending aorta and descending aorta normal otherwise. Great vessels normal.   ASSESSMENT:    1. Coronary artery disease involving native coronary artery of native heart without angina pectoris   2. Frequent PVCs   3. Bradycardia   4. Essential hypertension   5. Thoracic aortic aneurysm without rupture ([place])   6. Acute septic pulmonary embolism without acute cor pulmonale (HCC)      PLAN:  In order of problems listed above:  CAD S/P inf STEMI 2011, emergency CABG LIMA to LAD, SVG-OM, SVG-RCA, NST 12/2017 no  ischemia-no angina off aspirin since Eliquis started for pulmonary embolus.  2D echo done at [facility] and only have partial report dictated that shows normal LV function.  We will try to get copy of echo.  PVC's on low-dose metoprolol will change to succinate.  Magnesium continues to run low despite replacement.  Will increase to 800 mg twice daily.  Bradycardia patient continues to have bradycardia and hypotension.  Losartan stopped and metoprolol decreased to 50 mg once daily.  Patient continues to have hypotension and slow heart rates.  Will change metoprolol to succinate 25 mg half a tablet once daily.  Follow-up with me in 1 to 2 weeks to reassess.  Has monitor at home to wear for 3 days.  Daughter will place today.  Thoracic aortic aneurysm stable on CT 12/03/17. 3.6 x 3.3 cm  Cholangiocarcinoma on chemo [facility] gemcitabine/cisplatin-chemo currently interrupted because of 3 recent hospitalization for sepsis and pulmonary embolus and significant weakness.  Oncologist have given him 3 months if he has no further treatment and cannot guarantee any more than that with treatment.  Hospice is seeing him tomorrow.  Pulmonary embolus hospitalized at [facility] and now on Eliquis   Medication Adjustments/Labs and Tests Ordered: Current medicines are reviewed at length with the patient today.  Concerns regarding medicines are  outlined above.  Medication changes, Labs and Tests ordered today are listed in the Patient Instructions below. Patient Instructions  Medication Instructions:  Your physician has recommended you make the following change in your medication:   1. STOP: metoprolol tartrate (lopressor)  2. START: metoprolol succinate (Toprol-XL) 25 mg tablet: Take 1/2 tablet (12.5 mg) by mouth ONCE a day  3. INCREASE: magnesium oxide 400 mg tablet: Take 2 tablets (800 mg) by mouth twice a day  Lab work: None Ordered  If you have labs (blood work) drawn today and your  tests are completely normal, you will receive your results only by: Marland Kitchen MyChart Message (if you have MyChart) OR . A paper copy in the mail If you have any lab test that is abnormal or we need to change your treatment, we will call you to review the results.  Testing/Procedures: None ordered  Follow-Up: . Follow up with Ermalinda Barrios, PA via VIRTUAL VISIT on 12/22/18 at 12:00 PM  Any Other Special Instructions Will Be Listed Below (If Applicable).       Signed, Ermalinda Barrios, PA-C  12/01/2018 2:47 PM    [facility] [place], [place], [place]  [address] Phone: [phone]; Fax: [phone]

## 2018-12-01 ENCOUNTER — Other Ambulatory Visit: Payer: Self-pay | Admitting: Physician Assistant

## 2018-12-01 ENCOUNTER — Other Ambulatory Visit: Payer: Self-pay

## 2018-12-01 ENCOUNTER — Ambulatory Visit (INDEPENDENT_AMBULATORY_CARE_PROVIDER_SITE_OTHER): Payer: Medicare Other

## 2018-12-01 ENCOUNTER — Ambulatory Visit: Payer: Medicare Other | Admitting: Physician Assistant

## 2018-12-01 ENCOUNTER — Encounter: Payer: Self-pay | Admitting: Physician Assistant

## 2018-12-01 VITALS — BP 103/58 | HR 80 | Ht 69.0 in | Wt 136.2 lb

## 2018-12-01 DIAGNOSIS — I712 Thoracic aortic aneurysm, without rupture, unspecified: Secondary | ICD-10-CM

## 2018-12-01 DIAGNOSIS — I779 Disorder of arteries and arterioles, unspecified: Secondary | ICD-10-CM | POA: Diagnosis not present

## 2018-12-01 DIAGNOSIS — I493 Ventricular premature depolarization: Secondary | ICD-10-CM

## 2018-12-01 DIAGNOSIS — I495 Sick sinus syndrome: Secondary | ICD-10-CM | POA: Diagnosis not present

## 2018-12-01 DIAGNOSIS — R001 Bradycardia, unspecified: Secondary | ICD-10-CM | POA: Diagnosis not present

## 2018-12-01 DIAGNOSIS — I2690 Septic pulmonary embolism without acute cor pulmonale: Secondary | ICD-10-CM

## 2018-12-01 DIAGNOSIS — I10 Essential (primary) hypertension: Secondary | ICD-10-CM

## 2018-12-01 DIAGNOSIS — I2510 Atherosclerotic heart disease of native coronary artery without angina pectoris: Secondary | ICD-10-CM | POA: Diagnosis not present

## 2018-12-01 MED ORDER — METOPROLOL SUCCINATE ER 25 MG PO TB24: 12.5000 mg | ORAL_TABLET | Freq: Every day | ORAL | 3 refills | Status: AC

## 2018-12-01 MED ORDER — MAGNESIUM OXIDE 400 MG PO TABS: 800.0000 mg | ORAL_TABLET | Freq: Two times a day (BID) | ORAL | 3 refills | Status: AC

## 2018-12-01 NOTE — Patient Instructions (Addendum)
Medication Instructions:  Your physician has recommended you make the following change in your medication:   1. STOP: metoprolol tartrate (lopressor)  2. START: metoprolol succinate (Toprol-XL) 25 mg tablet: Take 1/2 tablet (12.5 mg) by mouth ONCE a day  3. INCREASE: magnesium oxide 400 mg tablet: Take 2 tablets (800 mg) by mouth twice a day  Lab work: None Ordered  If you have labs (blood work) drawn today and your tests are completely normal, you will receive your results only by: Marland Kitchen MyChart Message (if you have MyChart) OR . A paper copy in the mail If you have any lab test that is abnormal or we need to change your treatment, we will call you to review the results.  Testing/Procedures: None ordered  Follow-Up: . Follow up with Ermalinda Barrios, PA via VIRTUAL VISIT on 12/22/18 at 12:00 PM  Any Other Special Instructions Will Be Listed Below (If Applicable).

## 2018-12-11 NOTE — Progress Notes (Signed)
Virtual Visit via Video Note   This visit type was conducted due to national recommendations for restrictions regarding the COVID-19 Pandemic (e.g. social distancing) in an effort to limit this patient's exposure and mitigate transmission in our community.  Due to his co-morbid illnesses, this patient is at least at moderate risk for complications without adequate follow up.  This format is felt to be most appropriate for this patient at this time.  All issues noted in this document were discussed and addressed.  A limited physical exam was performed with this format.  Please refer to the patient's chart for his consent to telehealth for [facility].  Changed to   Date:  12/22/2018   ID:  Samuel Coleman, DOB 1945-12-29, MRN [id number]  Patient Location: Home Provider Location: Home  PCP:  Jani Gravel, MD  Cardiologist:  Lauree Chandler, MD  Electrophysiologist:  None   Evaluation Performed:  Follow-Up Visit  Chief Complaint:  f/u  History of Present Illness:    Samuel Coleman is a 73 y.o. male with history of CAD S/P inf STEMI 2011, emergency CABG LIMA to LAD, SVG-OM, SVG-RCA, NST 12/2017 no ischemia, echo 12/2017 LVEF 60-65% mod LVH, Thoracic aortic aneurysm, mild carotid disease, freq PVC's treated with acebutolol but no improvement so treated with metoprolol, Wegener's granulomatosis, DM.   LOV 09/24/18 increase in palpitations and slow HR in 50s with fatigue. 72 hr monitor and echo ordered but never done.. 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   I saw the patient 12/01/18 after he was in the hospital at [facility] for bradycardia and BP drops and diagnosed with sepsis. They stopped lisinopril and reduced metoprolol.Went home for 2 days then back in with pulmonary embolus started on eliquis. Went back in with another infection and just got home yesterday. Magnesium was very low and can't get it up. His daughter from [place], [place] is  here taking care of him. His oncologists have given him 3 months if he doesn't do any more chemo but very weak now. Has lost over 100 lb in the past year. Hospice was consulted.  Patient was still bradycardic and hypotensive. I decreased metoprolol to 25 mg 1/2 daily. He was going to wear a monitor for 3 days.   Was in [facility] ED 12/20/18 with severe dehydration and abdominal pain treated with 2 L IV fluids and short course of Keflex for possible UTI. Mg 1.5, K 4.9 sodium 128  3 day monitor showed sinus rhythm with frequent PVCs with bigeminy and trigeminy.  PVC burden was 27%.  This was reviewed by Dr. Angelena Form.  Given his cancer diagnosis and hospice he was not sure that we need to do any further treatment.  Patient is still very weak from ED visit 2 days ago. Denies palpitations. Pulse now 60-90. Feels very sleepy, can hardly keep his eyes open. Taking oxycodone 5 mg every 3 hrs for pain.  Pain management team coming today to help adjust meds. Having a hard time staying hydrated.       The patient does not have symptoms concerning for COVID-19 infection (fever, chills, cough, or new shortness of breath).    Past Medical History:  Diagnosis Date   Anxiety    Arthritis    Asthma    exercise induced   Back pain, chronic    gets injections in back   CAD (coronary artery disease)    a.  prior inferior MI; s/p 3V CABG 01/01/10 (LIMA to  LAD, SVG to OM, SVG to RCA- Dr.Bartle);  b. Last Myoview (05/2012):  Normal; EF 70% // c. Myoview 2/18: EF 60, no ischemia or infarction, Normal Study   Carotid stenosis    Carotid US 03/2016 1-39% BICA   Diabetes mellitus type 2 in nonobese (HCC)    Frequent PVCs    GERD (gastroesophageal reflux disease)    Gout    History of kidney stones    no problems with stones   Hx of echocardiogram    Echo (01/2010):  EF 55-60%, ? Apical HK, mild BAE, atrial septal aneurysm.   Hyperlipidemia    Hypertension    Ischemic colitis ([place])    noted  on CT back in August 2013 possible related to hypotension   Pneumonia    x 2   Premature atrial contractions    PUD (peptic ulcer disease) 1974   ST elevation myocardial infarction (STEMI) of inferior wall ([place]) 2011   s/p CABG 2011   Thoracic aortic aneurysm ([place])    a. last assessed 11/2017 3.6x3.3cm.   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) [facility])    Past Surgical History:  Procedure Laterality Date   BILIARY STENT PLACEMENT N/A 04/21/2018   Procedure: BILIARY STENT PLACEMENT;  Surgeon: Carol Ada, MD;  [facility]: [facility];  Service: Endoscopy;  Laterality: N/A;   BILIARY STENT PLACEMENT N/A 05/01/2018   Procedure: BILIARY STENT PLACEMENT;  Surgeon: Carol Ada, MD;  [facility];  Service: Endoscopy;  Laterality: N/A;   BILIARY STENT PLACEMENT N/A 08/07/2018   Procedure: BILIARY STENT PLACEMENT;  Surgeon: Carol Ada, MD;  [facility];  Service: Endoscopy;  Laterality: N/A;   CARDIAC CATHETERIZATION     CHEST TUBE INSERTION  1992   fx lt ribs-collapsed lung   CORONARY ARTERY BYPASS GRAFT  11/11   X 3 VESSELS   cyst removed from back of neck     cyst removel on right buttocks - 2014     EAR CYST EXCISION Left 09/02/2012   Procedure: EXCISION SEBACEOUS CYST LEFT POSTERIOR LEG;  Surgeon: Pedro Earls, MD;  Location: [facility];  Service: General;  Laterality: Left;   ENDOSCOPIC RETROGRADE CHOLANGIOPANCREATOGRAPHY (ERCP) WITH PROPOFOL N/A 04/21/2018   Procedure: ENDOSCOPIC RETROGRADE CHOLANGIOPANCREATOGRAPHY (ERCP) WITH PROPOFOL;  Surgeon: Carol Ada, MD;  [facility];  Service: Endoscopy;  Laterality: N/A;   ENDOSCOPIC RETROGRADE CHOLANGIOPANCREATOGRAPHY (ERCP) WITH PROPOFOL N/A 05/01/2018   Procedure: ENDOSCOPIC RETROGRADE CHOLANGIOPANCREATOGRAPHY (ERCP) WITH PROPOFOL;  Surgeon: Carol Ada, MD;  [facility];  Service: Endoscopy;  Laterality: N/A;   ERCP N/A 08/07/2018    Procedure: ENDOSCOPIC RETROGRADE CHOLANGIOPANCREATOGRAPHY (ERCP);  Surgeon: Carol Ada, MD;  Location[facility];  Service: Endoscopy;  Laterality: N/A;   Excision of deep left neck mass. SURGEON:  Leonides Sake. Lucia Gaskins, M.D.  1995   EYE SURGERY  both cararacts   [place] Right 12/02/2012   Procedure: OPEN RIGHT INGUINAL HERINA;  Surgeon: Pedro Earls, MD;  [facility];  Service: General;  Laterality: Right;   INGUINAL HERNIA REPAIR Left 11/02/2014   Procedure: OPEN LEFT INGUINAL HERNIA REPAIR WITH MESH;  Surgeon: Johnathan Hausen, MD;  [facility];  Service: General;  Laterality: Left;   INSERTION OF MESH Right 12/02/2012   Procedure: INSERTION OF MESH;  Surgeon: Pedro Earls, MD;  [facility];  Service: General;  Laterality: Right;   LUMBAR LAMINECTOMY/DECOMPRESSION MICRODISCECTOMY N/A 01/26/2015   Procedure: Lumbar 5-sacrum  1 decompression;  Surgeon: Phylliss Bob, MD;  Location: [facility] [place];  Service: Orthopedics;  Laterality: N/A;   SPHINCTEROTOMY  04/21/2018   Procedure: SPHINCTEROTOMY;  Surgeon: Carol Ada, MD;  Location[facility];  Service: Endoscopy;;   SPHINCTEROTOMY  05/01/2018   Procedure: Joan Mayans;  Surgeon: Carol Ada, MD;  Location: [facility];  Service: Endoscopy;;   SPYGLASS CHOLANGIOSCOPY N/A 05/01/2018   Procedure: [id number] CHOLANGIOSCOPY;  Surgeon: Carol Ada, MD;  [facility];  Service: Endoscopy;  Laterality: N/A;   STENT REMOVAL  05/01/2018   Procedure: STENT REMOVAL;  Surgeon: Carol Ada, MD;  [facility];  Service: Endoscopy;;   STENT REMOVAL  08/07/2018   Procedure: STENT REMOVAL;  Surgeon: Carol Ada, MD;  [facility];  Service: Endoscopy;;   TONSILLECTOMY       Current Meds  Medication Sig   allopurinol (ZYLOPRIM) 300 MG tablet Take 300 mg by mouth daily.   apixaban (ELIQUIS) 5 MG TABS tablet Take by mouth.   camphor-phenol (CAMPHO-PHENIQUE)  10.8-4.7 % LIQD Apply 1 application topically daily as needed (fever blisters).   carbidopa-levodopa (SINEMET IR) 25-100 MG tablet TAKE 1 TABLET BY MOUTH THREE TIMES A DAY   clotrimazole-betamethasone (LOTRISONE) cream Apply 1 application topically 2 (two) times daily. Apply to both feet   DEXILANT 60 MG capsule Take 60 mg by mouth daily.   Digestive Enzyme CAPS Take 2 capsules by mouth 3 (three) times daily with meals.   diphenhydrAMINE (BENADRYL) 12.5 MG/5ML liquid Take 12.5-25 mg by mouth daily as needed for allergies.   dronabinol (MARINOL) 2.5 MG capsule Take 2.5 mg by mouth See admin instructions. Take 2.5 mg 2 hours before dinner   hydrocortisone cream 1 % Apply 1 application topically as needed (for itching on ears or feet).    LORazepam (ATIVAN) 0.5 MG tablet Take 0.5 mg by mouth every 6 (six) hours as needed for anxiety.   magnesium oxide (MAG-OX) 400 MG tablet Take 2 tablets (800 mg total) by mouth 2 (two) times daily.   Melatonin 3 MG TABS Take by mouth.   metFORMIN (GLUCOPHAGE) 1000 MG tablet Take 1,000 mg by mouth 2 (two) times daily with a meal.   metoprolol succinate (TOPROL-XL) 25 MG 24 hr tablet Take 0.5 tablets (12.5 mg total) by mouth daily. Take with or immediately following a meal.   mirtazapine (REMERON) 15 MG tablet Take by mouth.   mupirocin ointment (BACTROBAN) 2 % Place 1 application into the nose daily as needed (wound care).   neomycin-bacitracin-polymyxin (NEOSPORIN) [phone] ointment Apply 1 application topically 2 (two) times daily as needed (FOR FOOT IRRITATION).    nitroGLYCERIN (NITROSTAT) 0.4 MG SL tablet Place 1 tablet (0.4 mg total) under the tongue every 5 (five) minutes as needed for chest pain.   ondansetron (ZOFRAN) 8 MG tablet Take by mouth.   oxyCODONE (OXY IR/ROXICODONE) 5 MG immediate release tablet    polyethylene glycol (MIRALAX / GLYCOLAX) 17 g packet Take 17 g by mouth daily.   predniSONE (DELTASONE) 5 MG tablet Take 5 mg by  mouth daily with breakfast.   senna (SENOKOT) 8.6 MG TABS tablet Take 2 tablets by mouth daily.   sitaGLIPtin (JANUVIA) 100 MG tablet Take 100 mg by mouth daily.   sulfamethoxazole-trimethoprim (BACTRIM DS) 800-160 MG tablet 1 tablet my mouth Monday, Wednesday, Friday   tamsulosin (FLOMAX) 0.4 MG CAPS capsule Take 0.4 mg by mouth daily.     Allergies:   Clindamycin/lincomycin, Levofloxacin, Fish oil, Methocarbamol, and Nsaids   Social  History   Tobacco Use   Smoking status: Former Smoker    Quit date: 02/25/1974    Years since quitting: 44.8   Smokeless tobacco: Never Used  Substance Use Topics   Alcohol use: Yes    Comment: rarely   Drug use: No     Family Hx: The patient's family history includes Esophageal cancer in his sister; Heart failure in his mother; Lung cancer in his sister; Pancreatic cancer in his maternal grandmother; Pneumonia in his father.  ROS:   Please see the history of present illness.      All other systems reviewed and are negative.   Prior CV studies:   The following studies were reviewed today:    10/2018 [facility]- TTE w/ normal EF, impaired diastolic relaxation, no evidence of pulm HTN or fluid  Echo 11/2019Study Conclusions   - Left ventricle: The cavity size was normal. Wall thickness was   increased in a pattern of moderate LVH. Systolic function was   normal. The estimated ejection fraction was in the range of 60%   to 65%. Wall motion was normal; there were no regional wall   motion abnormalities. The E/e&' ratio is between 8-15, suggesting   indeterminate LV filling pressure. - Left atrium: The atrium was normal in size. - Inferior vena cava: The vessel was normal in size. The   respirophasic diameter changes were in the normal range (= 50%),   consistent with normal central venous pressure.   Impressions:   - Compared to a prior study in 11/2016, the LVEF is higher at   60-65%. Frequent PVC&'s were noted.     ------------------------------------------------------------------- Labs, prior tests, procedures, and surgery: Coronary artery bypass grafting.   ------------------------------------------------------------------- Study data:  Comparison was made to the study of 12/05/2016.  Study status:  Routine.  Procedure:  The patient reported no pain pre or post test. Transthoracic echocardiography. Image quality was adequate. The study was technically difficult, as a result of poor sound wave transmission. Intravenous contrast (Definity) was administered.  Study completion:  There were no complications.     Transthoracic echocardiography.  M-mode, complete 2D, spectral Doppler, and color Doppler.  Birthdate:  Patient birthdate: 04/09/1945.  Age:  Patient is 74 yr old.  Sex:  Gender: male. BMI: 26.5 kg/m^2.  Blood pressure:     138/59  Patient status: Outpatient.  Study date:  Study date: Jan 20, 202019. Study time: 10:26 AM.  Location:  [place] [facility]    NST 11/2019Nuclear stress EF: 71%.  Normal perfusion No signifcant ischemia or scar.  This is a low risk study.      Chest CT 10/2019IMPRESSION: Chest Impression:   1. No evidence of aortic dissection. 2. 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   Abdomen / Pelvis Impression:   1. No evidence of dissection or aneurysm of the abdominal aorta or branches. 2. Mucosal enhancement of the extrahepatic bile ducts is nonspecific. Recommend clinical correlation for cholangitis. 3. Nonobstructing calculus in distal RIGHT ureter. 4. Bilateral nephrolithiasis. 5. LEFT inguinal hernia contains a loop of descending colon. No obstruction.     Electronically Signed   By: Suzy Bouchard M.D.   On: 12/03/2017 16:05 Contrast enhanced imaging demonstrates saccular aneurysm originating from the proximal descending thoracic aorta just past aortic arch. Aortic diameter at this aneurysm level measures 3.6 x 3.3 cm  in total compared to 3.9 x 3.3 cm on prior for no change. There is calcification of the saccular aneurysm  is wall (image 34/6). The ascending aorta and descending aorta normal otherwise. Great vessels normal.     Labs/Other Tests and Data Reviewed:    EKG:  3 day monitor 12/01/2018 showed sinus rhythm with frequent PVCs with bigeminy and trigeminy.  PVC burden was 27%.  This was reviewed by Dr. Angelena Form.  Given his cancer diagnosis and hospice he was not sure that we need to do any further treatment     Recent Labs: 12/23/2017: TSH 0.357 01/20/2018: Magnesium 1.1 08/07/2018: ALT 147; BUN 25; Creatinine, Ser 0.63; Hemoglobin 8.4; Platelets 52; Potassium 3.4; Sodium 134   Recent Lipid Panel No results found for: CHOL, TRIG, HDL, CHOLHDL, LDLCALC, LDLDIRECT  Wt Readings from Last 3 Encounters:  12/22/18 130 lb (59 kg)  12/01/18 136 lb 3.2 oz (61.8 kg)  09/24/18 140 lb (63.5 kg)     Objective:    Vital Signs:  BP (!) 151/65    Pulse 61    Ht 5\' 9"  (1.753 m)    Wt 130 lb (59 kg)    BMI 19.20 kg/m    VITAL SIGNS:  reviewed  ASSESSMENT & PLAN:    CAD S/P inf STEMI 2011, emergency CABG LIMA to LAD, SVG-OM, SVG-RCA, NST 12/2017 no ischemia-no angina off aspirin since Eliquis started for pulmonary embolus.  2D echo done at [facility] and only have partial report dictated that shows normal LV function.  can't acces echo report.   PVC's on low-dose metoprolol  changed to succinate LOV.  Magnesium continues to run low despite replacement.  Mg increased to 800 mg twice daily but in ER 2 days ago it was 1.5.3 day monitor showed sinus rhythm with frequent PVCs with bigeminy and trigeminy.  PVC burden was 27%.  This was reviewed by Dr. Angelena Form.  Given his cancer diagnosis and hospice he was not sure that we need to do any further treatment.Patient is asymptomatic with PVC's. Continue Mg supplement. Call if he develops symptoms      Bradycardia improved with lower dose toprol.  Hypotension improved with stopping Losartan  3 day monitor sinus rhythm without significant bradycardia but 27% PVC load.  Thoracic aortic aneurysm stable on CT 12/03/17. 3.6 x 3.3 cm   Cholangiocarcinoma on chemo [facility] gemcitabine/cisplatin-chemo currently interrupted because of 3 recent hospitalization for sepsis and pulmonary embolus and significant weakness.  Oncologists have given him 3 months if he has no further treatment and cannot guarantee any more than that with treatment.  Hospice is seeing-a lot of pain and sleepiness. Trouble staying hydrated.     COVID-19 Education: The signs and symptoms of COVID-19 were discussed with the patient and how to seek care for testing (follow up with PCP or arrange E-visit).   The importance of social distancing was discussed today.  Time:   Today, I have spent 15 minutes with the patient with telehealth technology discussing the above problems.     Medication Adjustments/Labs and Tests Ordered: Current medicines are reviewed at length with the patient today.  Concerns regarding medicines are outlined above.   Tests Ordered: No orders of the defined types were placed in this encounter.   Medication Changes: No orders of the defined types were placed in this encounter.   Follow Up:  Virtual Visit  in 3 month(s) Ermalinda Barrios PA-C  Signed, Ermalinda Barrios, PA-C  12/22/2018 12:30 PM    [facility]

## 2018-12-21 ENCOUNTER — Telehealth: Payer: Self-pay

## 2018-12-21 NOTE — Telephone Encounter (Signed)
Spoke with pt's daughter [name] and she confirmed a virtual visit tomorrow. Before I could review pt's medications the line went dead and I was not able to get her back.

## 2018-12-22 ENCOUNTER — Encounter: Payer: Self-pay | Admitting: Physician Assistant

## 2018-12-22 ENCOUNTER — Telehealth (INDEPENDENT_AMBULATORY_CARE_PROVIDER_SITE_OTHER): Payer: Medicare Other | Admitting: Physician Assistant

## 2018-12-22 ENCOUNTER — Other Ambulatory Visit: Payer: Self-pay

## 2018-12-22 VITALS — BP 151/65 | HR 61 | Ht 69.0 in | Wt 130.0 lb

## 2018-12-22 DIAGNOSIS — I2510 Atherosclerotic heart disease of native coronary artery without angina pectoris: Secondary | ICD-10-CM

## 2018-12-22 DIAGNOSIS — C221 Intrahepatic bile duct carcinoma: Secondary | ICD-10-CM | POA: Diagnosis not present

## 2018-12-22 DIAGNOSIS — C787 Secondary malignant neoplasm of liver and intrahepatic bile duct: Secondary | ICD-10-CM

## 2018-12-22 DIAGNOSIS — I493 Ventricular premature depolarization: Secondary | ICD-10-CM | POA: Diagnosis not present

## 2018-12-22 DIAGNOSIS — R001 Bradycardia, unspecified: Secondary | ICD-10-CM

## 2018-12-22 NOTE — Patient Instructions (Addendum)
Medication Instructions:  Your physician recommends that you continue on your current medications as directed. Please refer to the Current Medication list given to you today.  If you need a refill on your cardiac medications before your next appointment, please call your pharmacy.   Lab work: None Ordered  If you have labs (blood work) drawn today and your tests are completely normal, you will receive your results only by: Marland Kitchen MyChart Message (if you have MyChart) OR . A paper copy in the mail If you have any lab test that is abnormal or we need to change your treatment, we will call you to review the results.  Testing/Procedures: None ordered  Follow-Up: . Follow up with Ermalinda Barrios, PA via VIDEO Visit on  03/24/19 at 12:30 PM  Any Other Special Instructions Will Be Listed Below (If Applicable).

## 2019-01-26 DEATH — deceased

## 2019-03-24 ENCOUNTER — Telehealth: Payer: Medicare Other | Admitting: Physician Assistant

## 2020-07-13 IMAGING — MR MR ABDOMEN WO/W CM MRCP
11 of 18 series · 27 of 48 positions shown · IV contrast (multihance)
Comparison: 12/20/2016

CLINICAL DATA: Biliary ductal dilatation. History of primary
sclerosing cholangitis.

EXAM:
MRI ABDOMEN WITHOUT AND WITH CONTRAST (INCLUDING MRCP)
TECHNIQUE: Multiplanar multisequence MR imaging of the abdomen was performed
both before and after the administration of intravenous contrast.
Heavily T2-weighted images of the biliary and pancreatic ducts were
obtained, and three-dimensional MRCP images were rendered by post
processing.
CONTRAST:  15mL MULTIHANCE GADOBENATE DIMEGLUMINE 529 MG/ML IV SOLN

[Series 4: haste axial · axial · 5.0mm · 0.70mm/px · z∈[-80,+133]mm · 2 of 35 slices shown]
[im 1/35]
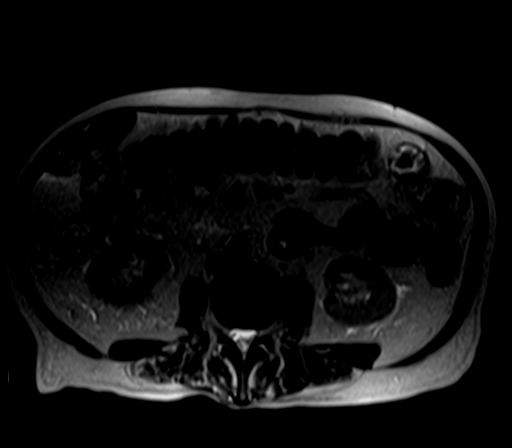
[im 35/35]
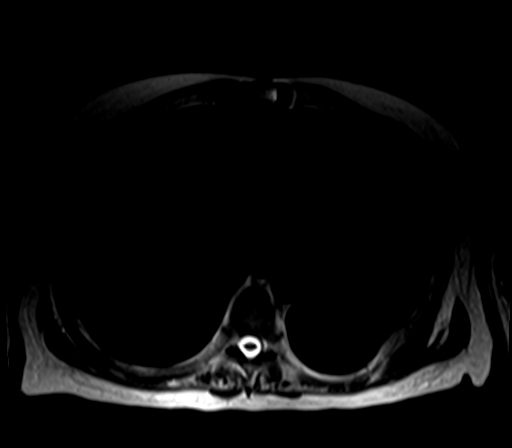

[Series 5: cor haste · coronal · 5.0mm · 0.70mm/px · 3 of 44 slices shown]
[im 1/44]
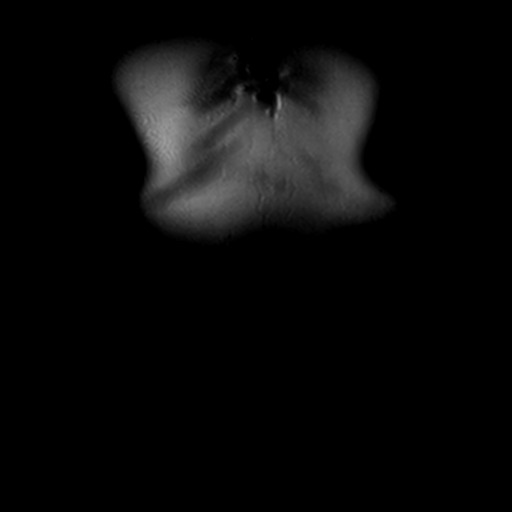
[im 22/44]
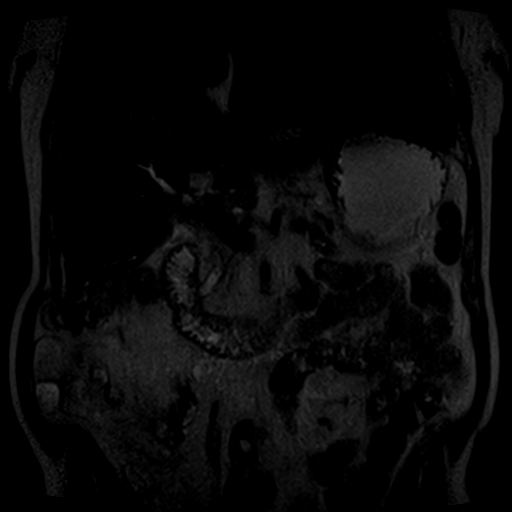
[im 44/44]
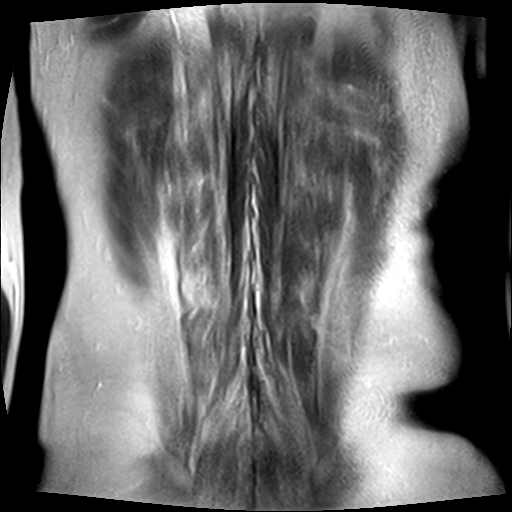

[Series 8: T2 · axial · 5.0mm · 0.94mm/px · z∈[-65,+166]mm · 2 of 38 slices shown]
[im 1/38]
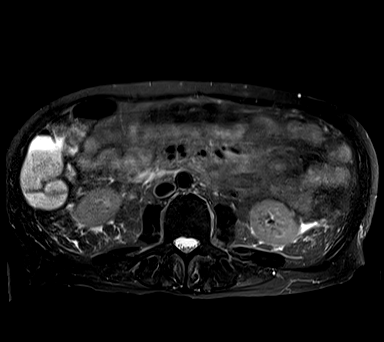
[im 38/38]
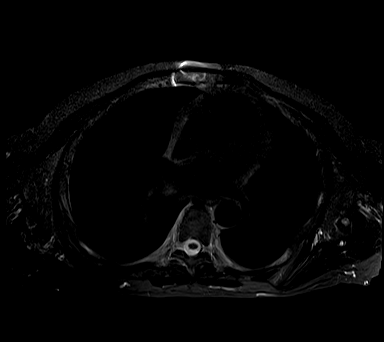

[Series 9: ep2d_[id number]_500_800_p2_trig · axial · 5.0mm · 1.88mm/px · z∈[-65,+166]mm · 4 of 114 slices shown]
[im 1/114]
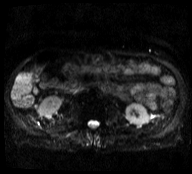
[im 38/114]
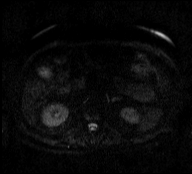
[im 76/114]
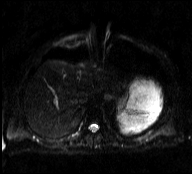
[im 114/114]
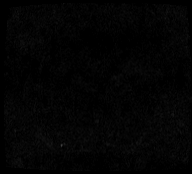

[Series 10: ep2d_[id number]_trig_adc · axial · 5.0mm · 1.88mm/px · 1 of 38 slices shown]
[im 1/38]
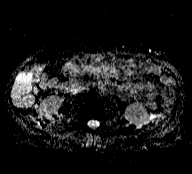

[Series 11: T1 · axial · 5.0mm · 0.70mm/px · z∈[-69,+153]mm · 3 of 76 slices shown]
[im 1/76]
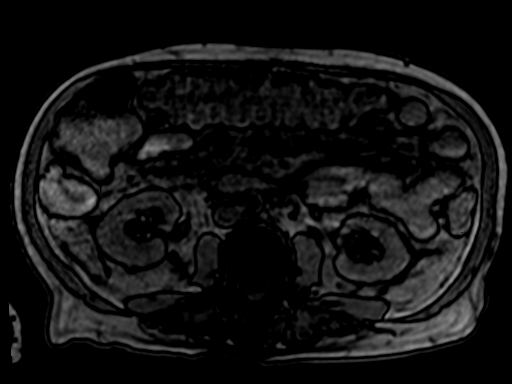
[im 38/76]
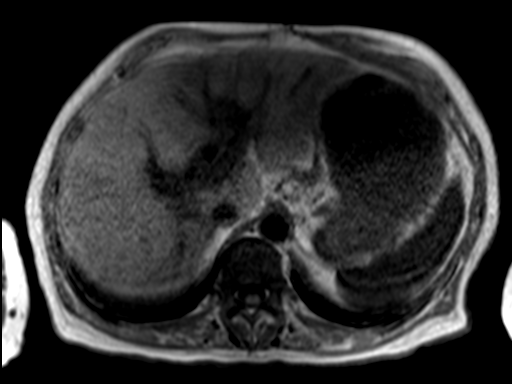
[im 76/76]
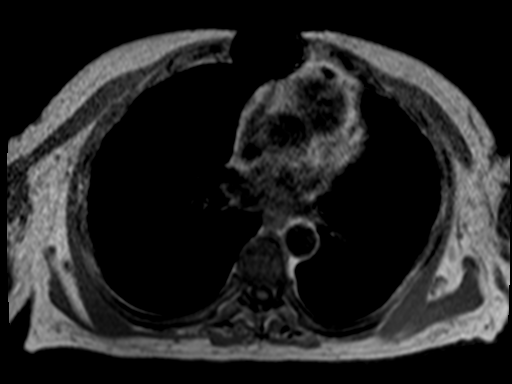

[Series 12: bSSFP · axial · 5.0mm · 0.70mm/px · 1 of 37 slices shown]
[im 1/37]
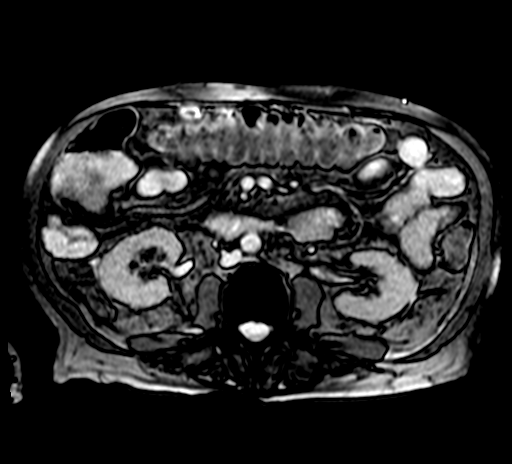

[Series 15: T1 dynamic · axial · non-contrast · 2.5mm · 0.70mm/px · z∈[-67,+151]mm · 3 of 88 slices shown]
[im 1/88]
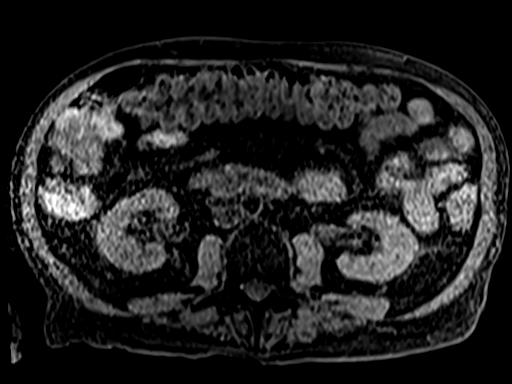
[im 44/88]
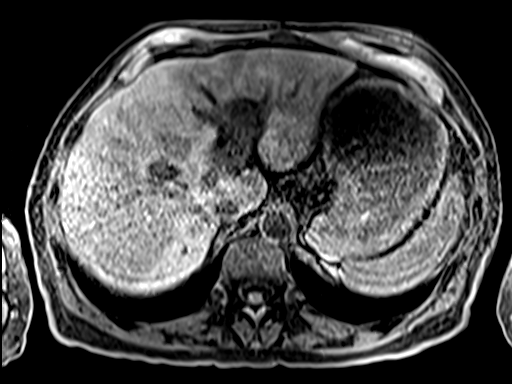
[im 88/88]
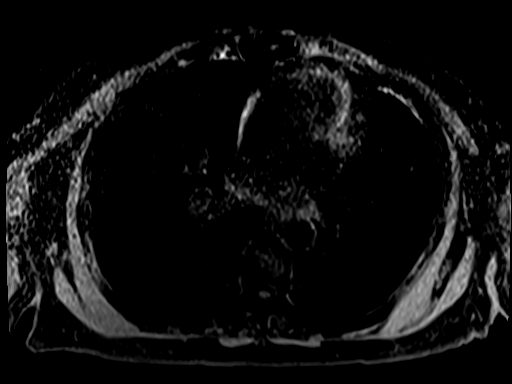

[Series 16: T1 dynamic post-contrast · axial · 2.5mm · 0.70mm/px · z∈[-67,+151]mm · 3 of 88 slices shown (1 of 3)]
[im 1/88]
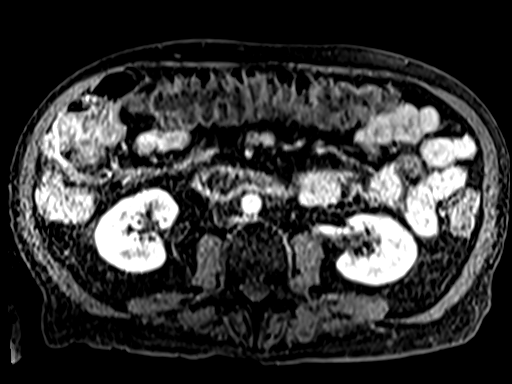
[im 44/88]
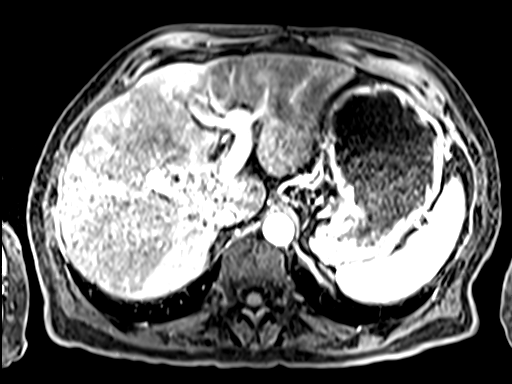
[im 88/88]
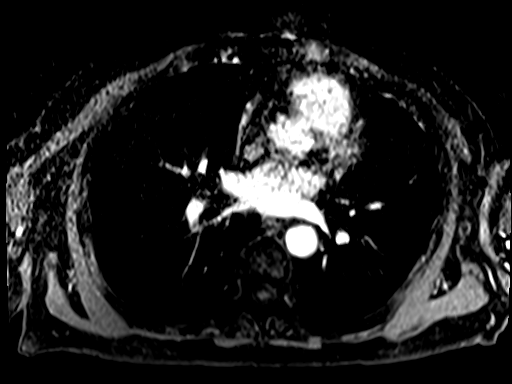

[Series 17: T1 dynamic post-contrast · axial · 2.5mm · 0.70mm/px · z∈[-67,+151]mm · 3 of 88 slices shown (2 of 3)]
[im 1/88]
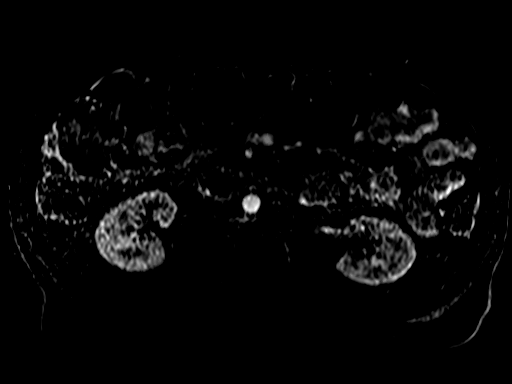
[im 44/88]
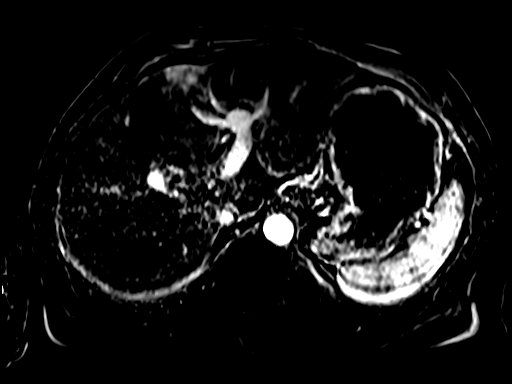
[im 88/88]
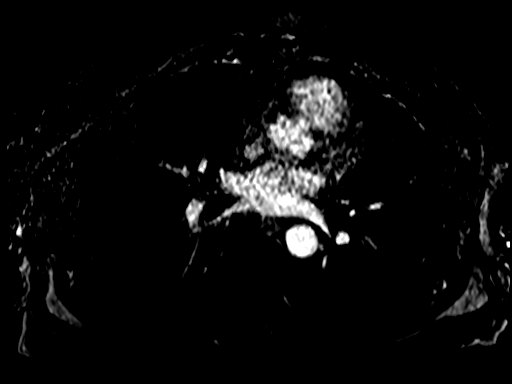

[Series 18: T1 dynamic post-contrast · axial · 2.5mm · 0.70mm/px · z∈[-67,+41]mm · 2 of 88 slices shown (3 of 3)]
[im 1/88]
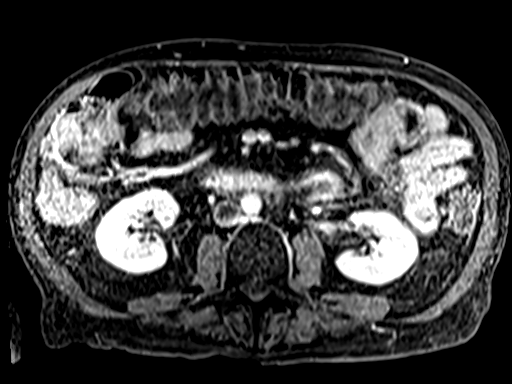
[im 44/88]
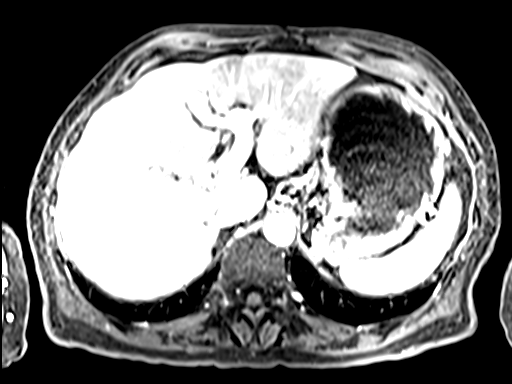

[27 of 48 positions shown; findings below may reference images not displayed]

FINDINGS: Lower chest: Unremarkable.

Hepatobiliary: Heterogeneous liver perfusion evident with mild
progression of intrahepatic biliary duct dilatation. Multiple areas
of intra and extrahepatic biliary duct stricture evident. Proximal
extrahepatic common duct measures up to 12 mm diameter today
compared to 9 mm previously. There is a tight stricture of the
common duct before it enters the head of the pancreas showing only a
string of the lumen (see image 23 coronal series 13). Enhancement
seen previously in the bile ducts is less prominent today. There is
no evidence for gallstones, gallbladder wall thickening, or
pericholecystic fluid.

Pancreas: As before, the pancreatic duct appears mildly dilated with
irregular contour/beading. T2 weighted imaging again shows narrowing
of the main pancreatic duct in the pancreatic head with a 6 mm
cystic lesion noted in the head of pancreas today ([DATE]).

Spleen:  No splenomegaly. No focal mass lesion.

Adrenals/Urinary Tract: No adrenal nodule or mass. 2.6 cm right
renal cyst evident on axial imaging. Coronal T2 weighted imaging
shows additional apparent cysts in the lower pole the right kidney
but these are not fully characterized on today's exam.

Stomach/Bowel: Stomach is unremarkable. No gastric wall thickening.
No evidence of outlet obstruction. Duodenum is normally positioned
as is the ligament of Treitz. No small bowel wall thickening. No
small bowel or colonic dilatation.

Vascular/Lymphatic: No abdominal aortic aneurysm. No abdominal
lymphadenopathy.

Other:  No intraperitoneal free fluid.

Musculoskeletal: No abnormal marrow enhancement within the
visualized bony anatomy.
IMPRESSION: 1. Slight interval progression of intra and extrahepatic biliary
duct dilatation with tight stricture identified in the common duct
on today's study just before it enters the head of the pancreas.
This results in a "string sign" on MRCP images. Multiple other areas
of biliary ductal narrowing are compatible with the reported history
of sclerosing cholangitis.
2. Similar appearance of mild dilatation of the main pancreatic duct
with irregular/beaded appearance and narrowing in the region of the
pancreatic head/neck..
3. New 6 mm cystic lesion in the head/neck of pancreas. Follow-up
MRI in 12 months recommended. This recommendation follows ACR
consensus guidelines: Management of Incidental Pancreatic Cysts: A
White Paper of the ACR Incidental Findings Committee. [HOSPITAL] 0081;[DATE].
